# Patient Record
Sex: Male | Born: 1970
Health system: Southern US, Community
[De-identification: ages and names within clinical notes are randomized; demographics above are authoritative.]

## PROBLEM LIST (undated history)

## (undated) DIAGNOSIS — I5032 Chronic diastolic (congestive) heart failure: Secondary | ICD-10-CM

## (undated) DIAGNOSIS — I340 Nonrheumatic mitral (valve) insufficiency: Secondary | ICD-10-CM

## (undated) DIAGNOSIS — C6290 Malignant neoplasm of unspecified testis, unspecified whether descended or undescended: Secondary | ICD-10-CM

## (undated) DIAGNOSIS — I499 Cardiac arrhythmia, unspecified: Secondary | ICD-10-CM

## (undated) DIAGNOSIS — G4733 Obstructive sleep apnea (adult) (pediatric): Secondary | ICD-10-CM

## (undated) DIAGNOSIS — I4819 Other persistent atrial fibrillation: Secondary | ICD-10-CM

## (undated) DIAGNOSIS — G4730 Sleep apnea, unspecified: Secondary | ICD-10-CM

## (undated) DIAGNOSIS — T407X1A Poisoning by cannabis (derivatives), accidental (unintentional), initial encounter: Secondary | ICD-10-CM

## (undated) DIAGNOSIS — Z9889 Other specified postprocedural states: Secondary | ICD-10-CM

## (undated) DIAGNOSIS — I4891 Unspecified atrial fibrillation: Secondary | ICD-10-CM

## (undated) DIAGNOSIS — N179 Acute kidney failure, unspecified: Secondary | ICD-10-CM

## (undated) DIAGNOSIS — F1290 Cannabis use, unspecified, uncomplicated: Secondary | ICD-10-CM

## (undated) DIAGNOSIS — I071 Rheumatic tricuspid insufficiency: Secondary | ICD-10-CM

## (undated) DIAGNOSIS — M109 Gout, unspecified: Secondary | ICD-10-CM

## (undated) DIAGNOSIS — T40711A Poisoning by cannabis, accidental (unintentional), initial encounter: Secondary | ICD-10-CM

## (undated) DIAGNOSIS — I4892 Unspecified atrial flutter: Secondary | ICD-10-CM

## (undated) HISTORY — DX: Acute kidney failure, unspecified: N17.9

## (undated) HISTORY — DX: Cardiac arrhythmia, unspecified: I49.9

## (undated) HISTORY — DX: Poisoning by cannabis, accidental (unintentional), initial encounter: T40.711A

## (undated) HISTORY — PX: SURGERY SCROTAL / TESTICULAR: SUR1316

## (undated) HISTORY — DX: Obstructive sleep apnea (adult) (pediatric): G47.33

---
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
## 2012-01-19 DEATH — deceased

## 2016-06-23 ENCOUNTER — Encounter (HOSPITAL_COMMUNITY): Payer: Self-pay | Admitting: *Deleted

## 2016-06-23 ENCOUNTER — Emergency Department (HOSPITAL_COMMUNITY): Payer: Medicaid - Out of State

## 2016-06-23 ENCOUNTER — Inpatient Hospital Stay (HOSPITAL_COMMUNITY)
Admission: EM | Admit: 2016-06-23 | Discharge: 2016-07-06 | DRG: 493 | Disposition: A | Discharge status: Home / Self Care | Payer: Medicaid - Out of State | Attending: Surgery | Admitting: Surgery

## 2016-06-23 DIAGNOSIS — N200 Calculus of kidney: Secondary | ICD-10-CM | POA: Diagnosis not present

## 2016-06-23 DIAGNOSIS — S32009A Unspecified fracture of unspecified lumbar vertebra, initial encounter for closed fracture: Secondary | ICD-10-CM

## 2016-06-23 DIAGNOSIS — Z79899 Other long term (current) drug therapy: Secondary | ICD-10-CM

## 2016-06-23 DIAGNOSIS — M109 Gout, unspecified: Secondary | ICD-10-CM | POA: Diagnosis present

## 2016-06-23 DIAGNOSIS — S8252XA Displaced fracture of medial malleolus of left tibia, initial encounter for closed fracture: Secondary | ICD-10-CM | POA: Diagnosis present

## 2016-06-23 DIAGNOSIS — S82201A Unspecified fracture of shaft of right tibia, initial encounter for closed fracture: Secondary | ICD-10-CM

## 2016-06-23 DIAGNOSIS — S43102A Unspecified dislocation of left acromioclavicular joint, initial encounter: Secondary | ICD-10-CM | POA: Diagnosis not present

## 2016-06-23 DIAGNOSIS — S0101XA Laceration without foreign body of scalp, initial encounter: Secondary | ICD-10-CM | POA: Diagnosis present

## 2016-06-23 DIAGNOSIS — S82892A Other fracture of left lower leg, initial encounter for closed fracture: Secondary | ICD-10-CM

## 2016-06-23 DIAGNOSIS — S82141A Displaced bicondylar fracture of right tibia, initial encounter for closed fracture: Principal | ICD-10-CM | POA: Diagnosis present

## 2016-06-23 DIAGNOSIS — S43109A Unspecified dislocation of unspecified acromioclavicular joint, initial encounter: Secondary | ICD-10-CM

## 2016-06-23 DIAGNOSIS — S82209A Unspecified fracture of shaft of unspecified tibia, initial encounter for closed fracture: Secondary | ICD-10-CM

## 2016-06-23 DIAGNOSIS — S32029A Unspecified fracture of second lumbar vertebra, initial encounter for closed fracture: Secondary | ICD-10-CM | POA: Diagnosis present

## 2016-06-23 DIAGNOSIS — S0191XA Laceration without foreign body of unspecified part of head, initial encounter: Secondary | ICD-10-CM | POA: Diagnosis present

## 2016-06-23 DIAGNOSIS — S93422A Sprain of deltoid ligament of left ankle, initial encounter: Secondary | ICD-10-CM | POA: Diagnosis not present

## 2016-06-23 DIAGNOSIS — Z8547 Personal history of malignant neoplasm of testis: Secondary | ICD-10-CM

## 2016-06-23 DIAGNOSIS — Z419 Encounter for procedure for purposes other than remedying health state, unspecified: Secondary | ICD-10-CM

## 2016-06-23 DIAGNOSIS — Z91010 Allergy to peanuts: Secondary | ICD-10-CM

## 2016-06-23 DIAGNOSIS — M25572 Pain in left ankle and joints of left foot: Secondary | ICD-10-CM

## 2016-06-23 HISTORY — DX: Unspecified fracture of shaft of unspecified tibia, initial encounter for closed fracture: S82.209A

## 2016-06-23 HISTORY — DX: Gout, unspecified: M10.9

## 2016-06-23 HISTORY — DX: Malignant neoplasm of unspecified testis, unspecified whether descended or undescended: C62.90

## 2016-06-23 LAB — COMPREHENSIVE METABOLIC PANEL
ALT: 25 U/L (ref 17–63)
AST: 37 U/L (ref 15–41)
Albumin: 3.8 g/dL (ref 3.5–5.0)
Alkaline Phosphatase: 78 U/L (ref 38–126)
Anion gap: 8 (ref 5–15)
BUN: 14 mg/dL (ref 6–20)
CHLORIDE: 103 mmol/L (ref 101–111)
CO2: 27 mmol/L (ref 22–32)
CREATININE: 1.13 mg/dL (ref 0.61–1.24)
Calcium: 8.8 mg/dL — ABNORMAL LOW (ref 8.9–10.3)
GFR calc Af Amer: >60 mL/min (ref >60)
GFR calc non Af Amer: >60 mL/min (ref >60)
Glucose, Bld: 105 mg/dL — ABNORMAL HIGH (ref 65–99)
Potassium: 3.2 mmol/L — ABNORMAL LOW (ref 3.5–5.1)
SODIUM: 138 mmol/L (ref 135–145)
Total Bilirubin: 0.7 mg/dL (ref 0.3–1.2)
Total Protein: 6.8 g/dL (ref 6.5–8.1)

## 2016-06-23 LAB — CBC
HCT: 49.1 % (ref 39.0–52.0)
Hemoglobin: 16.3 g/dL (ref 13.0–17.0)
MCH: 28.3 pg (ref 26.0–34.0)
MCHC: 33.2 g/dL (ref 30.0–36.0)
MCV: 85.4 fL (ref 78.0–100.0)
PLATELETS: 277 10*3/uL (ref 150–400)
RBC: 5.75 MIL/uL (ref 4.22–5.81)
RDW: 13.2 % (ref 11.5–15.5)
WBC: 11.5 10*3/uL — ABNORMAL HIGH (ref 4.0–10.5)

## 2016-06-23 LAB — PROTIME-INR
INR: 1.01
PROTHROMBIN TIME: 13.3 s (ref 11.4–15.2)

## 2016-06-23 LAB — I-STAT CHEM 8, ED
BUN: 17 mg/dL (ref 6–20)
Calcium, Ion: 1.07 mmol/L — ABNORMAL LOW (ref 1.13–1.30)
Chloride: 99 mmol/L — ABNORMAL LOW (ref 101–111)
Creatinine, Ser: 1.1 mg/dL (ref 0.61–1.24)
Glucose, Bld: 105 mg/dL — ABNORMAL HIGH (ref 65–99)
HEMATOCRIT: 50 % (ref 39.0–52.0)
HEMOGLOBIN: 17 g/dL (ref 13.0–17.0)
POTASSIUM: 3.1 mmol/L — AB (ref 3.5–5.1)
Sodium: 141 mmol/L (ref 135–145)
TCO2: 27 mmol/L (ref 0–100)

## 2016-06-23 LAB — I-STAT CG4 LACTIC ACID, ED: Lactic Acid, Venous: 2.37 mmol/L (ref 0.5–1.9)

## 2016-06-23 LAB — SAMPLE TO BLOOD BANK

## 2016-06-23 LAB — CDS SEROLOGY

## 2016-06-23 LAB — ETHANOL: Alcohol, Ethyl (B): <5 mg/dL (ref <5)

## 2016-06-23 MED ORDER — DIPHENHYDRAMINE HCL 12.5 MG/5ML PO ELIX
12.5000 mg | ORAL_SOLUTION | Freq: Four times a day (QID) | ORAL | Status: DC | PRN
  Administered 2016-06-25 – 2016-06-27 (×2): 12.5 mg via ORAL
  Filled 2016-06-23 (×2): qty 10

## 2016-06-23 MED ORDER — NALOXONE HCL 0.4 MG/ML IJ SOLN: 0.4000 mg | INTRAMUSCULAR | Status: DC | PRN

## 2016-06-23 MED ORDER — SODIUM CHLORIDE 0.9 % IV SOLN
INTRAVENOUS | Status: DC
  Administered 2016-06-23: 21:00:00 via INTRAVENOUS

## 2016-06-23 MED ORDER — OXYCODONE HCL 5 MG PO TABS
10.0000 mg | ORAL_TABLET | ORAL | Status: DC | PRN
  Administered 2016-06-24: 10 mg via ORAL
  Filled 2016-06-23 (×2): qty 2

## 2016-06-23 MED ORDER — SODIUM CHLORIDE 0.9% FLUSH: 9.0000 mL | INTRAVENOUS | Status: DC | PRN

## 2016-06-23 MED ORDER — ONDANSETRON HCL 4 MG/2ML IJ SOLN
4.0000 mg | Freq: Four times a day (QID) | INTRAMUSCULAR | Status: DC | PRN
  Administered 2016-07-03: 4 mg via INTRAVENOUS

## 2016-06-23 MED ORDER — TETANUS-DIPHTH-ACELL PERTUSSIS 5-2.5-18.5 LF-MCG/0.5 IM SUSP
0.5000 mL | Freq: Once | INTRAMUSCULAR | Status: AC
  Administered 2016-06-23: 0.5 mL via INTRAMUSCULAR
  Filled 2016-06-23: qty 0.5

## 2016-06-23 MED ORDER — ONDANSETRON HCL 4 MG/2ML IJ SOLN: 4.0000 mg | Freq: Four times a day (QID) | INTRAMUSCULAR | Status: DC | PRN

## 2016-06-23 MED ORDER — FENTANYL CITRATE (PF) 100 MCG/2ML IJ SOLN
100.0000 ug | Freq: Once | INTRAMUSCULAR | Status: AC
  Administered 2016-06-23: 100 ug via INTRAVENOUS
  Filled 2016-06-23: qty 2

## 2016-06-23 MED ORDER — HYDROMORPHONE 1 MG/ML IV SOLN
INTRAVENOUS | Status: DC
  Administered 2016-06-24: 0.5 mg via INTRAVENOUS
  Administered 2016-06-24: 2.7 mg via INTRAVENOUS
  Administered 2016-06-24 (×2): 0.5 mg via INTRAVENOUS
  Administered 2016-06-24: 4.4 mg via INTRAVENOUS
  Filled 2016-06-23: qty 25

## 2016-06-23 MED ORDER — TETANUS-DIPHTHERIA TOXOIDS TD 5-2 LFU IM INJ: 0.5000 mL | INJECTION | Freq: Once | INTRAMUSCULAR | Status: DC

## 2016-06-23 MED ORDER — ONDANSETRON HCL 4 MG PO TABS: 4.0000 mg | ORAL_TABLET | Freq: Four times a day (QID) | ORAL | Status: DC | PRN

## 2016-06-23 MED ORDER — ENOXAPARIN SODIUM 40 MG/0.4ML ~~LOC~~ SOLN
40.0000 mg | Freq: Every day | SUBCUTANEOUS | Status: DC
  Administered 2016-06-24 – 2016-07-06 (×12): 40 mg via SUBCUTANEOUS
  Filled 2016-06-23 (×11): qty 0.4

## 2016-06-23 MED ORDER — DIPHENHYDRAMINE HCL 50 MG/ML IJ SOLN: 12.5000 mg | Freq: Four times a day (QID) | INTRAMUSCULAR | Status: DC | PRN

## 2016-06-23 MED ORDER — SODIUM CHLORIDE 0.9 % IV BOLUS (SEPSIS)
1000.0000 mL | Freq: Once | INTRAVENOUS | Status: AC
  Administered 2016-06-23: 1000 mL via INTRAVENOUS

## 2016-06-23 MED ORDER — IOPAMIDOL (ISOVUE-300) INJECTION 61%
INTRAVENOUS | Status: AC
  Administered 2016-06-23: 100 mL
  Filled 2016-06-23: qty 100

## 2016-06-23 MED ORDER — KCL IN DEXTROSE-NACL 20-5-0.9 MEQ/L-%-% IV SOLN
INTRAVENOUS | Status: DC
  Filled 2016-06-23: qty 1000
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
## 2016-06-23 NOTE — ED Notes (Signed)
Ortho PA at bedside.  

## 2016-06-23 NOTE — ED Notes (Signed)
Attempted report 
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
## 2016-06-24 ENCOUNTER — Inpatient Hospital Stay (HOSPITAL_COMMUNITY): Payer: Medicaid - Out of State

## 2016-06-24 ENCOUNTER — Encounter (HOSPITAL_COMMUNITY): Payer: Self-pay | Admitting: Anesthesiology

## 2016-06-24 ENCOUNTER — Inpatient Hospital Stay (HOSPITAL_COMMUNITY): Payer: Medicaid - Out of State | Admitting: Anesthesiology

## 2016-06-24 ENCOUNTER — Encounter (HOSPITAL_COMMUNITY): Admission: EM | Disposition: A | Discharge status: Home / Self Care | Payer: Self-pay | Source: Home / Self Care

## 2016-06-24 HISTORY — PX: EXTERNAL FIXATION LEG: SHX1549

## 2016-06-24 LAB — COMPREHENSIVE METABOLIC PANEL
ALT: 22 U/L (ref 17–63)
ANION GAP: 8 (ref 5–15)
AST: 32 U/L (ref 15–41)
Albumin: 3.3 g/dL — ABNORMAL LOW (ref 3.5–5.0)
Alkaline Phosphatase: 66 U/L (ref 38–126)
BUN: 9 mg/dL (ref 6–20)
CALCIUM: 8.1 mg/dL — AB (ref 8.9–10.3)
CHLORIDE: 102 mmol/L (ref 101–111)
CO2: 29 mmol/L (ref 22–32)
Creatinine, Ser: 0.84 mg/dL (ref 0.61–1.24)
GFR calc non Af Amer: >60 mL/min (ref >60)
Glucose, Bld: 116 mg/dL — ABNORMAL HIGH (ref 65–99)
Potassium: 3.7 mmol/L (ref 3.5–5.1)
SODIUM: 139 mmol/L (ref 135–145)
Total Bilirubin: 0.8 mg/dL (ref 0.3–1.2)
Total Protein: 6.1 g/dL — ABNORMAL LOW (ref 6.5–8.1)

## 2016-06-24 LAB — CBC
HCT: 44 % (ref 39.0–52.0)
Hemoglobin: 14.3 g/dL (ref 13.0–17.0)
MCH: 28.1 pg (ref 26.0–34.0)
MCHC: 32.5 g/dL (ref 30.0–36.0)
MCV: 86.6 fL (ref 78.0–100.0)
PLATELETS: 238 10*3/uL (ref 150–400)
RBC: 5.08 MIL/uL (ref 4.22–5.81)
RDW: 13.5 % (ref 11.5–15.5)
WBC: 10 10*3/uL (ref 4.0–10.5)

## 2016-06-24 LAB — SURGICAL PCR SCREEN
MRSA, PCR: NEGATIVE
Staphylococcus aureus: POSITIVE — AB

## 2016-06-24 SURGERY — EXTERNAL FIXATION, LOWER EXTREMITY
Anesthesia: General | Site: Leg Lower | Laterality: Right

## 2016-06-24 MED ORDER — ACETAMINOPHEN 500 MG PO TABS
1000.0000 mg | ORAL_TABLET | Freq: Once | ORAL | Status: AC
  Administered 2016-06-24: 1000 mg via ORAL
  Filled 2016-06-24: qty 2

## 2016-06-24 MED ORDER — DOCUSATE SODIUM 100 MG PO CAPS
100.0000 mg | ORAL_CAPSULE | Freq: Two times a day (BID) | ORAL | Status: DC
  Administered 2016-06-24 – 2016-07-06 (×24): 100 mg via ORAL
  Filled 2016-06-24 (×25): qty 1

## 2016-06-24 MED ORDER — ONDANSETRON HCL 4 MG PO TABS: 4.0000 mg | ORAL_TABLET | Freq: Three times a day (TID) | ORAL | 0 refills | Status: DC | PRN

## 2016-06-24 MED ORDER — OMEPRAZOLE 20 MG PO CPDR: 20.0000 mg | DELAYED_RELEASE_CAPSULE | Freq: Every day | ORAL | 0 refills | Status: DC

## 2016-06-24 MED ORDER — METOCLOPRAMIDE HCL 5 MG/ML IJ SOLN: 5.0000 mg | Freq: Three times a day (TID) | INTRAMUSCULAR | Status: DC | PRN

## 2016-06-24 MED ORDER — FENTANYL CITRATE (PF) 100 MCG/2ML IJ SOLN
INTRAMUSCULAR | Status: AC
  Filled 2016-06-24: qty 2

## 2016-06-24 MED ORDER — PHENYLEPHRINE HCL 10 MG/ML IJ SOLN
INTRAMUSCULAR | Status: DC | PRN
  Administered 2016-06-24: 80 ug via INTRAVENOUS

## 2016-06-24 MED ORDER — LABETALOL HCL 5 MG/ML IV SOLN
5.0000 mg | INTRAVENOUS | Status: AC | PRN
  Administered 2016-06-24 (×2): 5 mg via INTRAVENOUS

## 2016-06-24 MED ORDER — FLEET ENEMA 7-19 GM/118ML RE ENEM: 1.0000 | ENEMA | Freq: Once | RECTAL | Status: DC | PRN

## 2016-06-24 MED ORDER — CEFAZOLIN SODIUM-DEXTROSE 2-4 GM/100ML-% IV SOLN
2.0000 g | Freq: Four times a day (QID) | INTRAVENOUS | Status: AC
  Administered 2016-06-24 – 2016-06-25 (×3): 2 g via INTRAVENOUS
  Filled 2016-06-24 (×3): qty 100

## 2016-06-24 MED ORDER — KETOROLAC TROMETHAMINE 30 MG/ML IJ SOLN
30.0000 mg | Freq: Once | INTRAMUSCULAR | Status: AC
  Administered 2016-06-24: 30 mg via INTRAVENOUS

## 2016-06-24 MED ORDER — BISACODYL 10 MG RE SUPP: 10.0000 mg | Freq: Every day | RECTAL | Status: DC | PRN

## 2016-06-24 MED ORDER — LACTATED RINGERS IV SOLN
INTRAVENOUS | Status: DC
  Administered 2016-06-24: 12:00:00 via INTRAVENOUS

## 2016-06-24 MED ORDER — HYDROMORPHONE HCL 1 MG/ML IJ SOLN
0.5000 mg | INTRAMUSCULAR | Status: DC | PRN
  Administered 2016-06-24 – 2016-06-27 (×3): 1 mg via INTRAVENOUS
  Filled 2016-06-24 (×3): qty 1

## 2016-06-24 MED ORDER — MUPIROCIN 2 % EX OINT
1.0000 "application " | TOPICAL_OINTMENT | Freq: Two times a day (BID) | CUTANEOUS | Status: AC
  Administered 2016-06-24 – 2016-06-28 (×8): 1 via NASAL
  Filled 2016-06-24: qty 22

## 2016-06-24 MED ORDER — METOCLOPRAMIDE HCL 5 MG PO TABS: 5.0000 mg | ORAL_TABLET | Freq: Three times a day (TID) | ORAL | Status: DC | PRN

## 2016-06-24 MED ORDER — LACTATED RINGERS IV SOLN
INTRAVENOUS | Status: DC | PRN
  Administered 2016-06-24 (×2): via INTRAVENOUS

## 2016-06-24 MED ORDER — POVIDONE-IODINE 10 % EX SWAB: 2.0000 "application " | Freq: Once | CUTANEOUS | Status: DC

## 2016-06-24 MED ORDER — PROPOFOL 10 MG/ML IV BOLUS
INTRAVENOUS | Status: AC
  Filled 2016-06-24: qty 40

## 2016-06-24 MED ORDER — PROPOFOL 10 MG/ML IV BOLUS
INTRAVENOUS | Status: DC | PRN
  Administered 2016-06-24: 200 mg via INTRAVENOUS

## 2016-06-24 MED ORDER — DOCUSATE SODIUM 100 MG PO CAPS: 100.0000 mg | ORAL_CAPSULE | Freq: Two times a day (BID) | ORAL | 0 refills | Status: DC

## 2016-06-24 MED ORDER — SODIUM CHLORIDE 0.9 % IR SOLN
Status: DC | PRN
  Administered 2016-06-24: 1000 mL

## 2016-06-24 MED ORDER — ASPIRIN EC 325 MG PO TBEC: 325.0000 mg | DELAYED_RELEASE_TABLET | Freq: Every day | ORAL | 0 refills | Status: DC

## 2016-06-24 MED ORDER — OXYCODONE HCL 5 MG PO TABS
5.0000 mg | ORAL_TABLET | ORAL | Status: DC | PRN
  Administered 2016-06-24: 5 mg via ORAL
  Administered 2016-06-24 – 2016-06-26 (×8): 10 mg via ORAL
  Administered 2016-06-26: 5 mg via ORAL
  Administered 2016-06-26 – 2016-06-27 (×4): 10 mg via ORAL
  Filled 2016-06-24 (×13): qty 2

## 2016-06-24 MED ORDER — CELECOXIB 200 MG PO CAPS
200.0000 mg | ORAL_CAPSULE | Freq: Two times a day (BID) | ORAL | Status: DC
  Administered 2016-06-24 – 2016-07-06 (×24): 200 mg via ORAL
  Filled 2016-06-24 (×24): qty 1

## 2016-06-24 MED ORDER — CHLORHEXIDINE GLUCONATE CLOTH 2 % EX PADS
6.0000 | MEDICATED_PAD | Freq: Every day | CUTANEOUS | Status: DC
  Administered 2016-06-25 – 2016-06-26 (×2): 6 via TOPICAL

## 2016-06-24 MED ORDER — KETOROLAC TROMETHAMINE 15 MG/ML IJ SOLN
15.0000 mg | Freq: Four times a day (QID) | INTRAMUSCULAR | Status: AC
  Administered 2016-06-24 – 2016-06-25 (×3): 15 mg via INTRAVENOUS
  Filled 2016-06-24 (×3): qty 1

## 2016-06-24 MED ORDER — KETOROLAC TROMETHAMINE 30 MG/ML IJ SOLN: 30.0000 mg | Freq: Once | INTRAMUSCULAR | Status: AC | PRN

## 2016-06-24 MED ORDER — POLYETHYLENE GLYCOL 3350 17 G PO PACK: 17.0000 g | PACK | Freq: Every day | ORAL | Status: DC | PRN

## 2016-06-24 MED ORDER — OXYCODONE-ACETAMINOPHEN 5-325 MG PO TABS: 1.0000 | ORAL_TABLET | ORAL | 0 refills | Status: DC | PRN

## 2016-06-24 MED ORDER — LIDOCAINE HCL (CARDIAC) 20 MG/ML IV SOLN
INTRAVENOUS | Status: DC | PRN
  Administered 2016-06-24: 50 mg via INTRAVENOUS

## 2016-06-24 MED ORDER — METHOCARBAMOL 500 MG PO TABS: 500.0000 mg | ORAL_TABLET | Freq: Four times a day (QID) | ORAL | 0 refills | Status: DC | PRN

## 2016-06-24 MED ORDER — LACTATED RINGERS IV SOLN
INTRAVENOUS | Status: DC
  Administered 2016-06-24: 07:00:00 via INTRAVENOUS

## 2016-06-24 MED ORDER — DEXTROSE-NACL 5-0.45 % IV SOLN: 100.0000 mL/h | INTRAVENOUS | Status: DC

## 2016-06-24 MED ORDER — FENTANYL CITRATE (PF) 100 MCG/2ML IJ SOLN
25.0000 ug | INTRAMUSCULAR | Status: DC | PRN
  Administered 2016-06-24 (×2): 25 ug via INTRAVENOUS
  Administered 2016-06-24: 50 ug via INTRAVENOUS
  Administered 2016-06-24 (×2): 25 ug via INTRAVENOUS

## 2016-06-24 MED ORDER — LABETALOL HCL 5 MG/ML IV SOLN
INTRAVENOUS | Status: AC
  Filled 2016-06-24: qty 4

## 2016-06-24 MED ORDER — METHOCARBAMOL 500 MG PO TABS
500.0000 mg | ORAL_TABLET | Freq: Four times a day (QID) | ORAL | Status: DC | PRN
  Administered 2016-06-24 – 2016-07-06 (×28): 500 mg via ORAL
  Filled 2016-06-24 (×30): qty 1

## 2016-06-24 MED ORDER — FENTANYL CITRATE (PF) 250 MCG/5ML IJ SOLN
INTRAMUSCULAR | Status: AC
  Filled 2016-06-24: qty 5

## 2016-06-24 MED ORDER — ACETAMINOPHEN 650 MG RE SUPP: 650.0000 mg | Freq: Four times a day (QID) | RECTAL | Status: DC | PRN

## 2016-06-24 MED ORDER — METHOCARBAMOL 1000 MG/10ML IJ SOLN
500.0000 mg | Freq: Four times a day (QID) | INTRAVENOUS | Status: DC | PRN
  Filled 2016-06-24: qty 5

## 2016-06-24 MED ORDER — ACETAMINOPHEN 325 MG PO TABS
650.0000 mg | ORAL_TABLET | Freq: Four times a day (QID) | ORAL | Status: DC | PRN
  Administered 2016-06-26 – 2016-07-06 (×22): 650 mg via ORAL
  Filled 2016-06-24 (×24): qty 2

## 2016-06-24 MED ORDER — CEFAZOLIN SODIUM-DEXTROSE 2-4 GM/100ML-% IV SOLN
2.0000 g | INTRAVENOUS | Status: AC
  Administered 2016-06-24: 2 g via INTRAVENOUS
  Filled 2016-06-24: qty 100

## 2016-06-24 MED ORDER — CHLORHEXIDINE GLUCONATE 4 % EX LIQD
60.0000 mL | Freq: Once | CUTANEOUS | Status: AC
Start: 2016-06-24 — End: 2016-06-24
  Administered 2016-06-24: 4 via TOPICAL

## 2016-06-24 MED ORDER — PROMETHAZINE HCL 25 MG/ML IJ SOLN: 6.2500 mg | INTRAMUSCULAR | Status: DC | PRN

## 2016-06-24 MED ORDER — KETOROLAC TROMETHAMINE 30 MG/ML IJ SOLN
INTRAMUSCULAR | Status: AC
  Filled 2016-06-24: qty 1

## 2016-06-24 MED ORDER — HYDROCODONE-ACETAMINOPHEN 7.5-325 MG PO TABS
1.0000 | ORAL_TABLET | Freq: Once | ORAL | Status: AC | PRN
  Administered 2016-06-25: 1 via ORAL
  Filled 2016-06-24: qty 1

## 2016-06-24 MED ORDER — ACETAMINOPHEN 500 MG PO TABS
1000.0000 mg | ORAL_TABLET | Freq: Three times a day (TID) | ORAL | Status: AC
  Administered 2016-06-24 – 2016-06-25 (×3): 1000 mg via ORAL
  Filled 2016-06-24 (×3): qty 2

## 2016-06-24 MED ORDER — MIDAZOLAM HCL 2 MG/2ML IJ SOLN
INTRAMUSCULAR | Status: AC
  Filled 2016-06-24: qty 2

## 2016-06-24 SURGICAL SUPPLY — 63 items
BANDAGE ACE 6X5 VEL STRL LF (GAUZE/BANDAGES/DRESSINGS) ×3 IMPLANT
BANDAGE ELASTIC 4 VELCRO ST LF (GAUZE/BANDAGES/DRESSINGS) IMPLANT
BANDAGE ELASTIC 6 VELCRO ST LF (GAUZE/BANDAGES/DRESSINGS) IMPLANT
BANDAGE ESMARK 6X9 LF (GAUZE/BANDAGES/DRESSINGS) ×1 IMPLANT
BNDG COHESIVE 6X5 TAN STRL LF (GAUZE/BANDAGES/DRESSINGS) ×3 IMPLANT
BNDG ESMARK 6X9 LF (GAUZE/BANDAGES/DRESSINGS) ×3
BNDG GAUZE ELAST 4 BULKY (GAUZE/BANDAGES/DRESSINGS) ×3 IMPLANT
CLEANER TIP ELECTROSURG 2X2 (MISCELLANEOUS) ×3 IMPLANT
CLOSURE WOUND 1/2 X4 (GAUZE/BANDAGES/DRESSINGS)
COVER SURGICAL LIGHT HANDLE (MISCELLANEOUS) ×3 IMPLANT
CUFF TOURNIQUET SINGLE 34IN LL (TOURNIQUET CUFF) IMPLANT
DRAPE C-ARM 42X72 X-RAY (DRAPES) ×3 IMPLANT
DRAPE C-ARMOR (DRAPES) ×3 IMPLANT
DRAPE U-SHAPE 47X51 STRL (DRAPES) ×3 IMPLANT
DURAPREP 26ML APPLICATOR (WOUND CARE) ×6 IMPLANT
ELECT REM PT RETURN 9FT ADLT (ELECTROSURGICAL) ×3
ELECTRODE REM PT RTRN 9FT ADLT (ELECTROSURGICAL) ×1 IMPLANT
EVACUATOR 1/8 PVC DRAIN (DRAIN) IMPLANT
GAUZE SPONGE 4X4 12PLY STRL (GAUZE/BANDAGES/DRESSINGS) IMPLANT
GAUZE XEROFORM 1X8 LF (GAUZE/BANDAGES/DRESSINGS) ×3 IMPLANT
GLOVE BIOGEL PI IND STRL 7.5 (GLOVE) ×1 IMPLANT
GLOVE BIOGEL PI IND STRL 8 (GLOVE) ×1 IMPLANT
GLOVE BIOGEL PI INDICATOR 7.5 (GLOVE) ×2
GLOVE BIOGEL PI INDICATOR 8 (GLOVE) ×2
GLOVE ORTHO TXT STRL SZ7.5 (GLOVE) ×3 IMPLANT
GLOVE SURG ORTHO 8.0 STRL STRW (GLOVE) ×3 IMPLANT
GOWN STRL REUS W/ TWL LRG LVL3 (GOWN DISPOSABLE) ×3 IMPLANT
GOWN STRL REUS W/TWL LRG LVL3 (GOWN DISPOSABLE) ×6
HANDPIECE INTERPULSE COAX TIP (DISPOSABLE)
KIT BASIN OR (CUSTOM PROCEDURE TRAY) ×3 IMPLANT
KIT ROOM TURNOVER OR (KITS) ×3 IMPLANT
MANIFOLD NEPTUNE II (INSTRUMENTS) ×3 IMPLANT
NEEDLE 22X1 1/2 (OR ONLY) (NEEDLE) IMPLANT
NS IRRIG 1000ML POUR BTL (IV SOLUTION) ×3 IMPLANT
PACK ORTHO EXTREMITY (CUSTOM PROCEDURE TRAY) ×3 IMPLANT
PAD ARMBOARD 7.5X6 YLW CONV (MISCELLANEOUS) ×6 IMPLANT
PADDING CAST COTTON 6X4 STRL (CAST SUPPLIES) IMPLANT
PIN APEX 5X150 (EXFIX) ×6 IMPLANT
PIN APEX 6X180MM EXFIX (EXFIX) ×6 IMPLANT
PIN CLAMP 5H 30DEG POST (EXFIX) ×6 IMPLANT
ROD HOFFMANN3 CONNECT 11X500 (EXFIX) ×6 IMPLANT
ROD TO ROD COUPLING EXFIX (EXFIX) ×12 IMPLANT
SET HNDPC FAN SPRY TIP SCT (DISPOSABLE) IMPLANT
SPONGE LAP 18X18 X RAY DECT (DISPOSABLE) IMPLANT
SPONGE SCRUB IODOPHOR (GAUZE/BANDAGES/DRESSINGS) IMPLANT
STAPLER VISISTAT 35W (STAPLE) IMPLANT
STOCKINETTE IMPERVIOUS LG (DRAPES) IMPLANT
STRIP CLOSURE SKIN 1/2X4 (GAUZE/BANDAGES/DRESSINGS) IMPLANT
SUCTION FRAZIER HANDLE 10FR (MISCELLANEOUS)
SUCTION TUBE FRAZIER 10FR DISP (MISCELLANEOUS) IMPLANT
SUT ETHILON 3 0 PS 1 (SUTURE) IMPLANT
SUT VIC AB 0 CT1 27 (SUTURE)
SUT VIC AB 0 CT1 27XBRD ANBCTR (SUTURE) IMPLANT
SUT VIC AB 2-0 CT1 27 (SUTURE)
SUT VIC AB 2-0 CT1 TAPERPNT 27 (SUTURE) IMPLANT
SYR CONTROL 10ML LL (SYRINGE) IMPLANT
TOWEL OR 17X24 6PK STRL BLUE (TOWEL DISPOSABLE) ×6 IMPLANT
TOWEL OR 17X26 10 PK STRL BLUE (TOWEL DISPOSABLE) ×3 IMPLANT
TUBE CONNECTING 12'X1/4 (SUCTIONS) ×1
TUBE CONNECTING 12X1/4 (SUCTIONS) ×2 IMPLANT
UNDERPAD 30X30 INCONTINENT (UNDERPADS AND DIAPERS) ×3 IMPLANT
WATER STERILE IRR 1000ML POUR (IV SOLUTION) ×6 IMPLANT
YANKAUER SUCT BULB TIP NO VENT (SUCTIONS) ×3 IMPLANT
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
## 2016-06-24 NOTE — Transfer of Care (Signed)
Immediate Anesthesia Transfer of Care Note  Patient: Anthony Byrd  Procedure(s) Performed: Procedure(s): EXTERNAL FIXATION LEG (Right)  Patient Location: PACU  Anesthesia Type:General  Level of Consciousness: sedated and patient cooperative  Airway & Oxygen Therapy: Patient Spontanous Breathing and Patient connected to nasal cannula oxygen  Post-op Assessment: Report given to RN and Post -op Vital signs reviewed and stable  Post vital signs: Reviewed and stable  Last Vitals:  Vitals:   06/24/16 0618 06/24/16 0840  BP:  (!) 163/110  Pulse:  (!) 105  Resp: 16 18  Temp:  36.8 C    Last Pain:  Vitals:   06/24/16 0618  TempSrc:   PainSc: 7          Complications: No apparent anesthesia complications
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
## 2016-06-24 NOTE — Anesthesia Procedure Notes (Signed)
Procedure Name: LMA Insertion Date/Time: 06/24/2016 7:45 AM Performed by: Eligha Bridegroom Pre-anesthesia Checklist: Patient identified, Emergency Drugs available, Suction available, Patient being monitored and Timeout performed Patient Re-evaluated:Patient Re-evaluated prior to inductionOxygen Delivery Method: Circle system utilized Preoxygenation: Pre-oxygenation with 100% oxygen Intubation Type: IV induction LMA Size: 4.0 Placement Confirmation: positive ETCO2 and breath sounds checked- equal and bilateral Tube secured with: Tape Dental Injury: Teeth and Oropharynx as per pre-operative assessment

## 2016-06-24 NOTE — Anesthesia Preprocedure Evaluation (Addendum)
Anesthesia Evaluation  Patient identified by MRN, date of birth, ID band Patient awake    Reviewed: Allergy & Precautions, NPO status , Patient's Chart, lab work & pertinent test results  Airway Mallampati: III  TM Distance: >3 FB Neck ROM: Full    Dental  (+) Dental Advisory Given   Pulmonary neg pulmonary ROS, sleep apnea ,    breath sounds clear to auscultation       Cardiovascular negative cardio ROS   Rhythm:Regular Rate:Normal     Neuro/Psych negative neurological ROS     GI/Hepatic negative GI ROS, Neg liver ROS,   Endo/Other  Gout  Renal/GU negative Renal ROS     Musculoskeletal   Abdominal (+) + obese,   Peds  Hematology negative hematology ROS (+)   Anesthesia Other Findings   Reproductive/Obstetrics                           Lab Results  Component Value Date   WBC 10.0 06/24/2016   HGB 14.3 06/24/2016   HCT 44.0 06/24/2016   MCV 86.6 06/24/2016   PLT 238 06/24/2016   Lab Results  Component Value Date   CREATININE 0.84 06/24/2016   BUN 9 06/24/2016   NA 139 06/24/2016   K 3.7 06/24/2016   CL 102 06/24/2016   CO2 29 06/24/2016    Anesthesia Physical Anesthesia Plan  ASA: II  Anesthesia Plan: General   Post-op Pain Management:    Induction: Intravenous  Airway Management Planned: LMA and Oral ETT  Additional Equipment:   Intra-op Plan:   Post-operative Plan: Extubation in OR  Informed Consent: I have reviewed the patients History and Physical, chart, labs and discussed the procedure including the risks, benefits and alternatives for the proposed anesthesia with the patient or authorized representative who has indicated his/her understanding and acceptance.   Dental advisory given  Plan Discussed with: CRNA  Anesthesia Plan Comments:         Anesthesia Quick Evaluation

## 2016-06-24 NOTE — Progress Notes (Addendum)
Patient placed on SpO2 monitor in short stay. Saturations noted to be in the mid 80's range. Patient placed on 2 lpm oxygen. SpO2 increased. Patient having periods of apnea while sleeping, SpO2 intermittently dropping to mid to high 80's. Patient responding to verbal stimuli to take deep breaths with increase of SpO2. Dr. [name], T made aware.
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
## 2016-06-24 NOTE — Anesthesia Postprocedure Evaluation (Signed)
Anesthesia Post Note  Patient: Anthony Byrd  Procedure(s) Performed: Procedure(s) (LRB): EXTERNAL FIXATION LEG (Right)  Patient location during evaluation: PACU Anesthesia Type: General Level of consciousness: awake and alert Pain management: pain level controlled Vital Signs Assessment: post-procedure vital signs reviewed and stable Respiratory status: spontaneous breathing, nonlabored ventilation, respiratory function stable and patient connected to nasal cannula oxygen Cardiovascular status: blood pressure returned to baseline and stable Postop Assessment: no signs of nausea or vomiting Anesthetic complications: no    Last Vitals:  Vitals:   06/24/16 0945 06/24/16 0950  BP: (!) 157/94   Pulse: 81   Resp: 12   Temp:  36.9 C    Last Pain:  Vitals:   06/24/16 1118  TempSrc:   PainSc: 4                  Tiajuana Amass
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
## 2016-06-24 NOTE — OR Nursing (Signed)
Pt with difficult pain management ...respiratory drive very sensitive to narcotic transiently; pt awakens intermittently with excruciating pain. Beginning to appear more evenly managed with toradol dose on board with small intermittent doses of fentanyl.

## 2016-06-25 DIAGNOSIS — S0191XA Laceration without foreign body of unspecified part of head, initial encounter: Secondary | ICD-10-CM

## 2016-06-25 HISTORY — DX: Laceration without foreign body of unspecified part of head, initial encounter: S01.91XA

## 2016-06-25 MED ORDER — CHLORHEXIDINE GLUCONATE CLOTH 2 % EX PADS
6.0000 | MEDICATED_PAD | Freq: Every day | CUTANEOUS | Status: DC
  Administered 2016-06-25 – 2016-06-28 (×2): 6 via TOPICAL

## 2016-06-25 MED ORDER — MUPIROCIN 2 % EX OINT
1.0000 "application " | TOPICAL_OINTMENT | Freq: Two times a day (BID) | CUTANEOUS | Status: AC
  Administered 2016-06-25 – 2016-06-30 (×7): 1 via NASAL
  Filled 2016-06-25: qty 22
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
## 2016-06-26 ENCOUNTER — Encounter (HOSPITAL_COMMUNITY): Payer: Self-pay | Admitting: Orthopedic Surgery

## 2016-06-26 MED ORDER — POVIDONE-IODINE 10 % EX SWAB
2.0000 "application " | Freq: Once | CUTANEOUS | Status: AC
  Administered 2016-06-27: 2 via TOPICAL

## 2016-06-26 MED ORDER — POVIDONE-IODINE 7.5 % EX SOLN
Freq: Once | CUTANEOUS | Status: AC
  Administered 2016-06-27: 06:00:00 via TOPICAL

## 2016-06-26 MED ORDER — DEXTROSE-NACL 5-0.45 % IV SOLN
100.0000 mL/h | INTRAVENOUS | Status: DC
  Administered 2016-06-26: 100 mL/h via INTRAVENOUS

## 2016-06-26 MED ORDER — ACETAMINOPHEN 500 MG PO TABS
1000.0000 mg | ORAL_TABLET | Freq: Once | ORAL | Status: AC
  Administered 2016-06-27: 1000 mg via ORAL
  Filled 2016-06-26: qty 2

## 2016-06-26 MED ORDER — DOCUSATE SODIUM 100 MG PO CAPS: 100.0000 mg | ORAL_CAPSULE | Freq: Two times a day (BID) | ORAL | Status: DC

## 2016-06-26 MED ORDER — CEFAZOLIN SODIUM-DEXTROSE 2-4 GM/100ML-% IV SOLN
2.0000 g | INTRAVENOUS | Status: AC
  Administered 2016-06-27: 2 g via INTRAVENOUS

## 2016-06-26 MED ORDER — POLYETHYLENE GLYCOL 3350 17 G PO PACK
17.0000 g | PACK | Freq: Every day | ORAL | Status: DC
  Administered 2016-06-28 – 2016-07-06 (×8): 17 g via ORAL
  Filled 2016-06-26 (×9): qty 1
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
## 2016-06-27 ENCOUNTER — Inpatient Hospital Stay (HOSPITAL_COMMUNITY): Payer: Medicaid - Out of State

## 2016-06-27 ENCOUNTER — Inpatient Hospital Stay (HOSPITAL_COMMUNITY): Payer: Medicaid - Out of State | Admitting: Certified Registered"

## 2016-06-27 ENCOUNTER — Encounter (HOSPITAL_COMMUNITY): Payer: Self-pay | Admitting: Surgery

## 2016-06-27 ENCOUNTER — Encounter (HOSPITAL_COMMUNITY): Admission: EM | Disposition: A | Discharge status: Home / Self Care | Payer: Self-pay | Source: Home / Self Care

## 2016-06-27 HISTORY — PX: EXTERNAL FIXATION REMOVAL: SHX5040

## 2016-06-27 HISTORY — PX: ORIF TIBIA PLATEAU: SHX2132

## 2016-06-27 LAB — SURGICAL PCR SCREEN
MRSA, PCR: NEGATIVE
STAPHYLOCOCCUS AUREUS: POSITIVE — AB

## 2016-06-27 SURGERY — REMOVAL, EXTERNAL FIXATION DEVICE, LOWER EXTREMITY
Anesthesia: General | Site: Leg Lower | Laterality: Right

## 2016-06-27 MED ORDER — LACTATED RINGERS IV SOLN
INTRAVENOUS | Status: DC | PRN
  Administered 2016-06-27 (×2): via INTRAVENOUS

## 2016-06-27 MED ORDER — 0.9 % SODIUM CHLORIDE (POUR BTL) OPTIME
TOPICAL | Status: DC | PRN
  Administered 2016-06-27: 1000 mL

## 2016-06-27 MED ORDER — ROCURONIUM BROMIDE 100 MG/10ML IV SOLN
INTRAVENOUS | Status: DC | PRN
  Administered 2016-06-27: 30 mg via INTRAVENOUS

## 2016-06-27 MED ORDER — SUFENTANIL CITRATE 50 MCG/ML IV SOLN
INTRAVENOUS | Status: AC
  Filled 2016-06-27: qty 1

## 2016-06-27 MED ORDER — LACTATED RINGERS IV SOLN: INTRAVENOUS | Status: DC

## 2016-06-27 MED ORDER — LIDOCAINE 2% (20 MG/ML) 5 ML SYRINGE
INTRAMUSCULAR | Status: AC
  Filled 2016-06-27: qty 5

## 2016-06-27 MED ORDER — HYDROMORPHONE HCL 1 MG/ML IJ SOLN
INTRAMUSCULAR | Status: AC
  Administered 2016-06-27: 0.5 mg via INTRAVENOUS
  Filled 2016-06-27: qty 1

## 2016-06-27 MED ORDER — HYDROMORPHONE HCL 1 MG/ML IJ SOLN
1.0000 mg | INTRAMUSCULAR | Status: DC | PRN
  Administered 2016-06-27 – 2016-07-03 (×9): 1 mg via INTRAVENOUS
  Filled 2016-06-27: qty 1
  Filled 2016-06-27: qty 2
  Filled 2016-06-27 (×9): qty 1

## 2016-06-27 MED ORDER — MIDAZOLAM HCL 2 MG/2ML IJ SOLN
INTRAMUSCULAR | Status: AC
  Filled 2016-06-27: qty 2

## 2016-06-27 MED ORDER — NEOSTIGMINE METHYLSULFATE 5 MG/5ML IV SOSY
PREFILLED_SYRINGE | INTRAVENOUS | Status: AC
  Filled 2016-06-27: qty 5

## 2016-06-27 MED ORDER — KETOROLAC TROMETHAMINE 15 MG/ML IJ SOLN
INTRAMUSCULAR | Status: AC
  Filled 2016-06-27: qty 1

## 2016-06-27 MED ORDER — KETOROLAC TROMETHAMINE 15 MG/ML IJ SOLN
INTRAMUSCULAR | Status: AC
Start: 2016-06-27 — End: 2016-06-27
  Administered 2016-06-27: 15 mg via INTRAVENOUS
  Filled 2016-06-27: qty 1

## 2016-06-27 MED ORDER — MIDAZOLAM HCL 5 MG/5ML IJ SOLN
INTRAMUSCULAR | Status: DC | PRN
  Administered 2016-06-27: 2 mg via INTRAVENOUS

## 2016-06-27 MED ORDER — PHENYLEPHRINE HCL 10 MG/ML IJ SOLN
INTRAMUSCULAR | Status: DC | PRN
  Administered 2016-06-27 (×3): 80 ug via INTRAVENOUS

## 2016-06-27 MED ORDER — OXYCODONE HCL 5 MG PO TABS
5.0000 mg | ORAL_TABLET | ORAL | Status: DC | PRN
  Administered 2016-06-27 (×2): 15 mg via ORAL
  Administered 2016-06-28 (×2): 10 mg via ORAL
  Administered 2016-06-28: 15 mg via ORAL
  Administered 2016-06-28 – 2016-06-29 (×7): 10 mg via ORAL
  Administered 2016-06-29: 15 mg via ORAL
  Administered 2016-06-29 (×3): 10 mg via ORAL
  Administered 2016-06-30 (×4): 15 mg via ORAL
  Administered 2016-06-30: 10 mg via ORAL
  Administered 2016-06-30 – 2016-07-02 (×12): 15 mg via ORAL
  Administered 2016-07-03: 10 mg via ORAL
  Administered 2016-07-03: 5 mg via ORAL
  Administered 2016-07-03 – 2016-07-05 (×13): 15 mg via ORAL
  Administered 2016-07-05: 10 mg via ORAL
  Administered 2016-07-06 (×3): 15 mg via ORAL
  Filled 2016-06-27 (×2): qty 2
  Filled 2016-06-27: qty 3
  Filled 2016-06-27: qty 2
  Filled 2016-06-27: qty 1
  Filled 2016-06-27 (×2): qty 3
  Filled 2016-06-27 (×2): qty 2
  Filled 2016-06-27: qty 3
  Filled 2016-06-27: qty 2
  Filled 2016-06-27: qty 3
  Filled 2016-06-27: qty 2
  Filled 2016-06-27 (×4): qty 3
  Filled 2016-06-27: qty 2
  Filled 2016-06-27 (×16): qty 3
  Filled 2016-06-27: qty 2
  Filled 2016-06-27: qty 3
  Filled 2016-06-27: qty 2
  Filled 2016-06-27 (×3): qty 3
  Filled 2016-06-27: qty 2
  Filled 2016-06-27 (×7): qty 3
  Filled 2016-06-27: qty 2
  Filled 2016-06-27: qty 3
  Filled 2016-06-27: qty 2
  Filled 2016-06-27 (×3): qty 3
  Filled 2016-06-27: qty 2
  Filled 2016-06-27 (×2): qty 3

## 2016-06-27 MED ORDER — PROPOFOL 10 MG/ML IV BOLUS
INTRAVENOUS | Status: DC | PRN
  Administered 2016-06-27: 250 mg via INTRAVENOUS

## 2016-06-27 MED ORDER — OXYCODONE HCL 5 MG PO TABS
ORAL_TABLET | ORAL | Status: AC
  Administered 2016-06-27: 10 mg via ORAL
  Filled 2016-06-27: qty 2

## 2016-06-27 MED ORDER — KETOROLAC TROMETHAMINE 30 MG/ML IJ SOLN
INTRAMUSCULAR | Status: AC
  Filled 2016-06-27: qty 1

## 2016-06-27 MED ORDER — CEFAZOLIN SODIUM 1 G IJ SOLR
INTRAMUSCULAR | Status: AC
  Filled 2016-06-27: qty 20

## 2016-06-27 MED ORDER — BUPIVACAINE HCL (PF) 0.25 % IJ SOLN
INTRAMUSCULAR | Status: DC | PRN
  Administered 2016-06-27 (×3): 10 mL

## 2016-06-27 MED ORDER — PROPOFOL 10 MG/ML IV BOLUS
INTRAVENOUS | Status: AC
  Filled 2016-06-27: qty 20

## 2016-06-27 MED ORDER — BACITRACIN-NEOMYCIN-POLYMYXIN 400-5-5000 EX OINT
TOPICAL_OINTMENT | CUTANEOUS | Status: AC
  Filled 2016-06-27: qty 1

## 2016-06-27 MED ORDER — BUPIVACAINE HCL (PF) 0.25 % IJ SOLN
INTRAMUSCULAR | Status: AC
  Filled 2016-06-27: qty 30

## 2016-06-27 MED ORDER — SUFENTANIL CITRATE 50 MCG/ML IV SOLN
INTRAVENOUS | Status: DC | PRN
  Administered 2016-06-27: 15 ug via INTRAVENOUS
  Administered 2016-06-27 (×2): 5 ug via INTRAVENOUS
  Administered 2016-06-27: 10 ug via INTRAVENOUS

## 2016-06-27 MED ORDER — HYDROMORPHONE HCL 1 MG/ML IJ SOLN
INTRAMUSCULAR | Status: AC
  Filled 2016-06-27: qty 1

## 2016-06-27 MED ORDER — CEFAZOLIN SODIUM-DEXTROSE 2-4 GM/100ML-% IV SOLN
2.0000 g | Freq: Four times a day (QID) | INTRAVENOUS | Status: AC
  Administered 2016-06-27 – 2016-06-28 (×3): 2 g via INTRAVENOUS
  Filled 2016-06-27 (×3): qty 100

## 2016-06-27 MED ORDER — SODIUM CHLORIDE 0.9 % IJ SOLN
INTRAMUSCULAR | Status: AC
  Filled 2016-06-27: qty 10

## 2016-06-27 MED ORDER — SUCCINYLCHOLINE CHLORIDE 20 MG/ML IJ SOLN
INTRAMUSCULAR | Status: DC | PRN
  Administered 2016-06-27: 140 mg via INTRAVENOUS

## 2016-06-27 MED ORDER — LACTATED RINGERS IV SOLN
INTRAVENOUS | Status: DC
  Administered 2016-06-27: 13:00:00 via INTRAVENOUS

## 2016-06-27 MED ORDER — PROMETHAZINE HCL 25 MG/ML IJ SOLN
INTRAMUSCULAR | Status: AC
  Filled 2016-06-27: qty 1

## 2016-06-27 MED ORDER — LACTATED RINGERS IV SOLN
INTRAVENOUS | Status: DC
  Administered 2016-06-27: 08:00:00 via INTRAVENOUS

## 2016-06-27 MED ORDER — ROCURONIUM BROMIDE 10 MG/ML (PF) SYRINGE
PREFILLED_SYRINGE | INTRAVENOUS | Status: AC
  Filled 2016-06-27: qty 10

## 2016-06-27 MED ORDER — HYDROMORPHONE HCL 1 MG/ML IJ SOLN
0.2500 mg | INTRAMUSCULAR | Status: DC | PRN
  Administered 2016-06-27 (×4): 0.5 mg via INTRAVENOUS

## 2016-06-27 MED ORDER — LIDOCAINE HCL (CARDIAC) 20 MG/ML IV SOLN
INTRAVENOUS | Status: DC | PRN
  Administered 2016-06-27: 100 mg via INTRAVENOUS

## 2016-06-27 MED ORDER — NEOSTIGMINE METHYLSULFATE 10 MG/10ML IV SOLN
INTRAVENOUS | Status: DC | PRN
  Administered 2016-06-27: 1 mg via INTRAVENOUS

## 2016-06-27 MED ORDER — PHENYLEPHRINE 40 MCG/ML (10ML) SYRINGE FOR IV PUSH (FOR BLOOD PRESSURE SUPPORT)
PREFILLED_SYRINGE | INTRAVENOUS | Status: AC
  Filled 2016-06-27: qty 10

## 2016-06-27 MED ORDER — ONDANSETRON HCL 4 MG/2ML IJ SOLN
INTRAMUSCULAR | Status: DC | PRN
  Administered 2016-06-27: 4 mg via INTRAVENOUS

## 2016-06-27 MED ORDER — KETOROLAC TROMETHAMINE 15 MG/ML IJ SOLN
15.0000 mg | Freq: Four times a day (QID) | INTRAMUSCULAR | Status: DC
  Administered 2016-06-27: 15 mg via INTRAVENOUS

## 2016-06-27 SURGICAL SUPPLY — 89 items
BANDAGE ELASTIC 4 VELCRO ST LF (GAUZE/BANDAGES/DRESSINGS) IMPLANT
BANDAGE ELASTIC 6 VELCRO ST LF (GAUZE/BANDAGES/DRESSINGS) IMPLANT
BANDAGE ESMARK 6X9 LF (GAUZE/BANDAGES/DRESSINGS) ×1 IMPLANT
BIT DRILL 3.1X204 (BIT) ×3 IMPLANT
BIT DRILL AO MATTA 2.5MX230M (BIT) ×1 IMPLANT
BLADE SURG 10 STRL SS (BLADE) ×3 IMPLANT
BLADE SURG 15 STRL LF DISP TIS (BLADE) ×1 IMPLANT
BLADE SURG 15 STRL SS (BLADE) ×2
BNDG COHESIVE 4X5 TAN STRL (GAUZE/BANDAGES/DRESSINGS) ×3 IMPLANT
BNDG ELASTIC 6X10 VLCR STRL LF (GAUZE/BANDAGES/DRESSINGS) ×3 IMPLANT
BNDG ESMARK 6X9 LF (GAUZE/BANDAGES/DRESSINGS) ×3
BNDG GAUZE ELAST 4 BULKY (GAUZE/BANDAGES/DRESSINGS) ×3 IMPLANT
COVER MAYO STAND STRL (DRAPES) IMPLANT
COVER SURGICAL LIGHT HANDLE (MISCELLANEOUS) ×3 IMPLANT
CUFF TOURNIQUET SINGLE 34IN LL (TOURNIQUET CUFF) IMPLANT
DRAPE C-ARM 42X72 X-RAY (DRAPES) ×3 IMPLANT
DRAPE C-ARMOR (DRAPES) ×3 IMPLANT
DRAPE IMP U-DRAPE 54X76 (DRAPES) ×6 IMPLANT
DRAPE INCISE IOBAN 66X45 STRL (DRAPES) ×3 IMPLANT
DRAPE U-SHAPE 47X51 STRL (DRAPES) ×3 IMPLANT
DRILL BIT AO MATTA 2.5MX230M (BIT) ×3
DRSG ADAPTIC 3X8 NADH LF (GAUZE/BANDAGES/DRESSINGS) IMPLANT
DRSG MEPILEX BORDER 4X4 (GAUZE/BANDAGES/DRESSINGS) ×3 IMPLANT
DRSG MEPITEL 4X7.2 (GAUZE/BANDAGES/DRESSINGS) IMPLANT
DRSG PAD ABDOMINAL 8X10 ST (GAUZE/BANDAGES/DRESSINGS) ×3 IMPLANT
ELECT REM PT RETURN 9FT ADLT (ELECTROSURGICAL) ×3
ELECTRODE REM PT RTRN 9FT ADLT (ELECTROSURGICAL) ×1 IMPLANT
GAUZE SPONGE 4X4 12PLY STRL (GAUZE/BANDAGES/DRESSINGS) IMPLANT
GAUZE XEROFORM 1X8 LF (GAUZE/BANDAGES/DRESSINGS) ×3 IMPLANT
GLOVE BIO SURGEON STRL SZ7 (GLOVE) IMPLANT
GLOVE BIO SURGEON STRL SZ7.5 (GLOVE) ×6 IMPLANT
GLOVE BIOGEL PI IND STRL 6.5 (GLOVE) ×1 IMPLANT
GLOVE BIOGEL PI IND STRL 7.0 (GLOVE) IMPLANT
GLOVE BIOGEL PI IND STRL 7.5 (GLOVE) ×1 IMPLANT
GLOVE BIOGEL PI IND STRL 8 (GLOVE) ×2 IMPLANT
GLOVE BIOGEL PI INDICATOR 6.5 (GLOVE) ×2
GLOVE BIOGEL PI INDICATOR 7.0 (GLOVE)
GLOVE BIOGEL PI INDICATOR 7.5 (GLOVE) ×2
GLOVE BIOGEL PI INDICATOR 8 (GLOVE) ×4
GOWN STRL REUS W/ TWL LRG LVL3 (GOWN DISPOSABLE) ×3 IMPLANT
GOWN STRL REUS W/TWL LRG LVL3 (GOWN DISPOSABLE) ×6
IMMOBILIZER KNEE 22 (SOFTGOODS) ×3 IMPLANT
IMMOBILIZER KNEE 22 UNIV (SOFTGOODS) IMPLANT
K-WIRE SMOOTH 2.0X150 (WIRE) ×3
KIT BASIN OR (CUSTOM PROCEDURE TRAY) ×3 IMPLANT
KIT ROOM TURNOVER OR (KITS) ×3 IMPLANT
KWIRE SMOOTH 2.0X150 (WIRE) ×1 IMPLANT
MANIFOLD NEPTUNE II (INSTRUMENTS) ×3 IMPLANT
NDL SUT 6 .5 CRC .975X.05 MAYO (NEEDLE) IMPLANT
NEEDLE 22X1 1/2 (OR ONLY) (NEEDLE) ×3 IMPLANT
NEEDLE MAYO TAPER (NEEDLE)
NS IRRIG 1000ML POUR BTL (IV SOLUTION) ×3 IMPLANT
PACK ORTHO EXTREMITY (CUSTOM PROCEDURE TRAY) ×3 IMPLANT
PAD ABD 8X10 STRL (GAUZE/BANDAGES/DRESSINGS) ×3 IMPLANT
PAD ARMBOARD 7.5X6 YLW CONV (MISCELLANEOUS) ×6 IMPLANT
PAD CAST 4YDX4 CTTN HI CHSV (CAST SUPPLIES) IMPLANT
PADDING CAST COTTON 4X4 STRL (CAST SUPPLIES)
PLATE 4HOLE RIGHT (Plate) ×3 IMPLANT
SCREW  LOCKING 4.0 (Screw) ×3 IMPLANT
SCREW CORT 48X3.5XST NS LF (Screw) ×1 IMPLANT
SCREW CORTEX ST MATTA 3.5X34MM (Screw) ×6 IMPLANT
SCREW CORTEX ST MATTA 3.5X38M (Screw) ×6 IMPLANT
SCREW CORTEX ST MATTA 3.5X75MM (Screw) ×3 IMPLANT
SCREW CORTICAL 3.5X48MM (Screw) ×2 IMPLANT
SCREW LOCKING 4.0X65MM (Screw) ×6 IMPLANT
SCREW LOCKING 75MM (Screw) ×3 IMPLANT
SPONGE GAUZE 4X4 12PLY STER LF (GAUZE/BANDAGES/DRESSINGS) ×3 IMPLANT
SPONGE LAP 18X18 X RAY DECT (DISPOSABLE) ×3 IMPLANT
STOCKINETTE IMPERVIOUS LG (DRAPES) IMPLANT
SUCTION FRAZIER HANDLE 10FR (MISCELLANEOUS) ×2
SUCTION TUBE FRAZIER 10FR DISP (MISCELLANEOUS) ×1 IMPLANT
SUT ETHILON 3 0 PS 1 (SUTURE) ×6 IMPLANT
SUT FIBERWIRE #2 38 T-5 BLUE (SUTURE)
SUT FIBERWIRE 2-0 18 17.9 3/8 (SUTURE) ×6
SUT MNCRL AB 4-0 PS2 18 (SUTURE) IMPLANT
SUT MON AB 2-0 CT1 36 (SUTURE) ×3 IMPLANT
SUT VIC AB 0 CT1 27 (SUTURE) ×4
SUT VIC AB 0 CT1 27XBRD ANBCTR (SUTURE) ×2 IMPLANT
SUTURE FIBERWR #2 38 T-5 BLUE (SUTURE) IMPLANT
SUTURE FIBERWR 2-0 18 17.9 3/8 (SUTURE) ×2 IMPLANT
SYR CONTROL 10ML LL (SYRINGE) ×3 IMPLANT
TOWEL OR 17X24 6PK STRL BLUE (TOWEL DISPOSABLE) ×3 IMPLANT
TOWEL OR 17X26 10 PK STRL BLUE (TOWEL DISPOSABLE) ×6 IMPLANT
TOWEL OR NON WOVEN STRL DISP B (DISPOSABLE) IMPLANT
TRAY FOLEY CATH 14FR (SET/KITS/TRAYS/PACK) IMPLANT
TUBE CONNECTING 12'X1/4 (SUCTIONS) ×1
TUBE CONNECTING 12X1/4 (SUCTIONS) ×2 IMPLANT
WATER STERILE IRR 1000ML POUR (IV SOLUTION) ×3 IMPLANT
YANKAUER SUCT BULB TIP NO VENT (SUCTIONS) ×3 IMPLANT

## 2016-06-27 NOTE — Progress Notes (Signed)
Called ortho tech about getting Music therapist for patient.

## 2016-06-27 NOTE — Op Note (Signed)
06/23/2016 - 06/27/2016  4:40 PM  PATIENT:  [name]    PRE-OPERATIVE DIAGNOSIS:  RIGHT TIBIAL PLATEAU FRACTURE  POST-OPERATIVE DIAGNOSIS:  Same  PROCEDURE:  REMOVAL EXTERNAL FIXATION LEG, OPEN REDUCTION INTERNAL FIXATION (ORIF) TIBIAL PLATEAU  SURGEON:  Mahmood Boehringer, Ernesta Amble, MD  ASSISTANT: Roxan Hockey, PA-C, he was present and scrubbed throughout the case, critical for completion in a timely fashion, and for retraction, instrumentation, and closure.   ANESTHESIA:   gen  PREOPERATIVE INDICATIONS:  Anthony Byrd is a  45 y.o. male with a diagnosis of RIGHT TIBIAL PLATEAU FRACTURE who failed conservative measures and elected for surgical management.    The risks benefits and alternatives were discussed with the patient preoperatively including but not limited to the risks of infection, bleeding, nerve injury, cardiopulmonary complications, the need for revision surgery, among others, and the patient was willing to proceed.  OPERATIVE IMPLANTS: [employer]ker ant lat stainless plate  OPERATIVE FINDINGS: stable fixation  BLOOD LOSS: [id number]  COMPLICATIONS: none  TOURNIQUET TIME: none  OPERATIVE PROCEDURE:  Patient was identified in the preoperative holding area and site was marked by me He was transported to the operating theater and placed on the table in supine position taking care to pad all bony prominences. After a preincinduction time out anesthesia was induced. The right lower extremity was prepped and draped in normal sterile fashion and a pre-incision timeout was performed. He received ancef for preoperative antibiotics.   I prepped his external fixator into the field as well. I removed his lateral bar and placed Betadine spray over the newly exposed portion of the fixator prior to touching it.  I then made an anterior lateral approach to his knee I split his IT band in line with this I entered the anterior compartment and elevated off the anterior crest of the tibia. I performed a  fasciotomy here distal and left this open to allow for swelling.  Next I made a sub-meniscal arthrotomy into the joint. Identified the articular surface was congruent. I placed a repair stitch in the lateral meniscus where was torn from the capsule.  Thoroughly irrigated the joint.  Next I selected an anterior lateral locking plate and pinned it into place as very happy with the fixation I did bend it slightly to match his mild tibia vera that was symmetric to his contralateral side on bilateral lower extremity x-rays.  I placed 3 locking screws proximally I then placed 4 shaft screws pulling the shaft out of any fracture varus.  I placed the kickstand screw.  I took multiple x-rays as very happy with his fracture reduction and placement of all hardware. I then thoroughly irrigated his incision I repaired his capsule back down to the plate I closed his IT band and elected anterior compartment open as I performed a fasciotomy here.  I then removed his external fixator I closed his incisions in layers I then placed a sterile dressing and he was awoken and taken the PACU in stable condition.  POST OPERATIVE PLAN: Nonweightbearing Bledsoe locked in extension chemical DVT prophylaxis. Dispo when clears PT

## 2016-06-27 NOTE — Anesthesia Preprocedure Evaluation (Signed)
Anesthesia Evaluation  Patient identified by MRN, date of birth, ID band Patient awake    Reviewed: Allergy & Precautions, H&P , NPO status , Patient's Chart, lab work & pertinent test results  Airway Mallampati: II  TM Distance: >3 FB Neck ROM: full    Dental no notable dental hx. (+) Dental Advisory Given, Teeth Intact   Pulmonary neg pulmonary ROS,    Pulmonary exam normal breath sounds clear to auscultation       Cardiovascular Exercise Tolerance: Good negative cardio ROS Normal cardiovascular exam Rhythm:regular Rate:Normal     Neuro/Psych negative neurological ROS  negative psych ROS   GI/Hepatic negative GI ROS, Neg liver ROS,   Endo/Other  negative endocrine ROS  Renal/GU negative Renal ROS  negative genitourinary   Musculoskeletal   Abdominal   Peds  Hematology negative hematology ROS (+)   Anesthesia Other Findings   Reproductive/Obstetrics negative OB ROS                             Anesthesia Physical Anesthesia Plan  ASA: I  Anesthesia Plan: General   Post-op Pain Management:    Induction: Intravenous  Airway Management Planned: LMA  Additional Equipment:   Intra-op Plan:   Post-operative Plan:   Informed Consent: I have reviewed the patients History and Physical, chart, labs and discussed the procedure including the risks, benefits and alternatives for the proposed anesthesia with the patient or authorized representative who has indicated his/her understanding and acceptance.   Dental Advisory Given  Plan Discussed with: CRNA  Anesthesia Plan Comments:         Anesthesia Quick Evaluation  

## 2016-06-27 NOTE — Anesthesia Procedure Notes (Signed)
Procedure Name: Intubation Date/Time: 06/27/2016 9:11 AM Performed by: Izora Gala Pre-anesthesia Checklist: Patient identified, Emergency Drugs available, Suction available and Patient being monitored Patient Re-evaluated:Patient Re-evaluated prior to inductionOxygen Delivery Method: Circle system utilized Preoxygenation: Pre-oxygenation with 100% oxygen Intubation Type: IV induction Ventilation: Mask ventilation without difficulty Laryngoscope Size: Miller and 3 Grade View: Grade I Tube type: Oral Tube size: 7.5 mm Number of attempts: 1 Airway Equipment and Method: Stylet,  LTA kit utilized and Bite block Placement Confirmation: ETT inserted through vocal cords under direct vision,  positive ETCO2 and breath sounds checked- equal and bilateral Secured at: 22 cm Tube secured with: Tape Dental Injury: Teeth and Oropharynx as per pre-operative assessment

## 2016-06-27 NOTE — Interval H&P Note (Signed)
History and Physical Interval Note:  06/27/2016 8:24 AM  Anthony Byrd  has presented today for surgery, with the diagnosis of RIGHT TIBIAL PLATEAU FRACTURE  The various methods of treatment have been discussed with the patient and family. After consideration of risks, benefits and other options for treatment, the patient has consented to  Procedure(s): REMOVAL EXTERNAL FIXATION LEG (Right) OPEN REDUCTION INTERNAL FIXATION (ORIF) TIBIAL PLATEAU (Right) as a surgical intervention .  The patient's history has been reviewed, patient examined, no change in status, stable for surgery.  I have reviewed the patient's chart and labs.  Questions were answered to the patient's satisfaction.     Anthony Byrd

## 2016-06-27 NOTE — Progress Notes (Signed)
PT Cancellation Note  Patient Details Name: Anthony Byrd MRN: [id number] DOB: [birth year]   Cancelled Treatment:    Reason Eval/Treat Not Completed: Patient at procedure, will follow as able.    Cassell Clement, PT, CSCS Pager [phone] Office [phone]  06/27/2016, 9:48 AM
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
## 2016-06-27 NOTE — OR Nursing (Signed)
Anthony Byrd continues to have c/o 10/10 pain when awake.  Dr. Landry Dyke notified of pain despite Dilaudid 2mg  IV, Oxy 10 mg IR, and Toradol 15 mg IV.  Pt does rest comfortably for short periods of time.

## 2016-06-27 NOTE — Anesthesia Postprocedure Evaluation (Signed)
Anesthesia Post Note  Patient: Anthony Byrd  Procedure(s) Performed: Procedure(s) (LRB): REMOVAL EXTERNAL FIXATION LEG (Right) OPEN REDUCTION INTERNAL FIXATION (ORIF) TIBIAL PLATEAU (Right)  Patient location during evaluation: PACU Anesthesia Type: General Level of consciousness: awake and alert Pain management: pain level controlled Vital Signs Assessment: post-procedure vital signs reviewed and stable Respiratory status: spontaneous breathing, nonlabored ventilation, respiratory function stable and patient connected to nasal cannula oxygen Cardiovascular status: blood pressure returned to baseline and stable Postop Assessment: no signs of nausea or vomiting Anesthetic complications: no    Last Vitals:  Vitals:   06/27/16 1238 06/27/16 1248  BP: (!) 170/92 (!) 173/93  Pulse:  81  Resp:  14  Temp:  36.8 C    Last Pain:  Vitals:   06/27/16 1248  TempSrc: Oral  PainSc:                  [name]
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
## 2016-06-27 NOTE — Discharge Instructions (Signed)
Elevate leg and apply ice as frequently as possible.    Diet: As you were doing prior to hospitalization   Shower:  May shower but keep the wounds dry, use an occlusive plastic wrap, NO SOAKING IN TUB.  If the bandage gets wet, change with a clean dry gauze.  Leave dressings in place and keep the splint dry with a plastic bag.  Dressing:  Leave dressings in place and we will change your bandages during your first follow-up appointment.    Activity:  Increase activity slowly as tolerated, but follow the weight bearing instructions below.  The rules on driving is that you can not be taking narcotics while you drive, and you must feel in control of the vehicle.    Weight Bearing:   Non Weight bearing Right Leg.  To prevent constipation: you may use a stool softener such as -  Colace (over the counter) 100 mg by mouth twice a day  Drink plenty of fluids (prune juice may be helpful) and high fiber foods Miralax (over the counter) for constipation as needed.    Itching:  If you experience itching with your medications, try taking only a single pain pill, or even half a pain pill at a time.  You may take up to 10 pain pills per day, and you can also use benadryl over the counter for itching or also to help with sleep.   Precautions:  If you experience chest pain or shortness of breath - call 911 immediately for transfer to the hospital emergency department!!  If you develop a fever greater that 101 F, purulent drainage from wound, increased redness or drainage from wound, or calf pain -- Call the office at [phone]                                                 Follow- Up Appointment:  Please call for an appointment to be seen in 2 weeks [place] - [phone]
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
## 2016-06-27 NOTE — Transfer of Care (Signed)
Immediate Anesthesia Transfer of Care Note  Patient: Anthony Byrd  Procedure(s) Performed: Procedure(s): REMOVAL EXTERNAL FIXATION LEG (Right) OPEN REDUCTION INTERNAL FIXATION (ORIF) TIBIAL PLATEAU (Right)  Patient Location: PACU  Anesthesia Type:General  Level of Consciousness: awake, alert , oriented and patient cooperative  Airway & Oxygen Therapy: Patient Spontanous Breathing and Patient connected to nasal cannula oxygen  Post-op Assessment: Report given to RN, Post -op Vital signs reviewed and stable and Patient moving all extremities  Post vital signs: Reviewed and stable  Last Vitals:  Vitals:   06/27/16 0546 06/27/16 1120  BP: (!) 119/100 (!) 172/88  Pulse: 79 95  Resp: 16 (!) 9  Temp: 36.8 C 37 C    Last Pain:  Vitals:   06/27/16 0802  TempSrc:   PainSc: 3       Patients Stated Pain Goal: 0 ([id number])  Complications: No apparent anesthesia complications

## 2016-06-28 MED ORDER — SODIUM CHLORIDE 0.9% FLUSH: 3.0000 mL | INTRAVENOUS | Status: DC | PRN

## 2016-06-28 NOTE — Plan of Care (Signed)
Problem: Safety: Goal: Ability to remain free from injury will improve Outcome: Progressing No fall or injuries noted this shift.   Problem: Physical Regulation: Goal: Will remain free from infection Outcome: Progressing No s/s of infection noted  Problem: Tissue Perfusion: Goal: Risk factors for ineffective tissue perfusion will decrease Outcome: Progressing No s/s of dvt noted  Problem: Activity: Goal: Risk for activity intolerance will decrease Outcome: Progressing Was up in the chair for hours and tolerated well  Problem: Fluid Volume: Goal: Ability to maintain a balanced intake and output will improve Outcome: Progressing Good intake and output  Problem: Nutrition: Goal: Adequate nutrition will be maintained Outcome: Progressing Adequate nutrition  Problem: Bowel/Gastric: Goal: Will not experience complications related to bowel motility Outcome: Progressing No bowel issues reported

## 2016-06-28 NOTE — Progress Notes (Signed)
npatient Rehabilitation  Per PTA request patient was screened by Gunnar Fusi for appropriateness for an Inpatient Acute Rehab consult.  At this time note that a consult has been ordered.  Plan for admissions coordinator to follow up after MD has completed consult.    Carmelia Roller., CCC/SLP Admission Coordinator  [facility]  Cell [phone]
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
## 2016-06-28 NOTE — Clinical Social Work Placement (Signed)
   CLINICAL SOCIAL WORK PLACEMENT  NOTE  Date:  06/28/2016  Patient Details  Name: Anthony Byrd MRN: [id number] Date of Birth: 1970/12/11  Clinical Social Work is seeking post-discharge placement for this patient at the [place] level of care (*CSW will initial, date and re-position this form in  chart as items are completed):  Yes   Patient/family provided with [facility] Work Department's list of facilities offering this level of care within the geographic area requested by the patient (or if unable, by the patient's family).  Yes   Patient/family informed of their freedom to choose among providers that offer the needed level of care, that participate in Medicare, Medicaid or managed care program needed by the patient, have an available bed and are willing to accept the patient.  Yes   Patient/family informed of [facility]'s ownership interest in [facility] and [facility], as well as of the fact that they are under no obligation to receive care at these facilities.  PASRR submitted to EDS on 06/28/16     PASRR number received on 06/28/16     Existing PASRR number confirmed on       FL2 transmitted to all facilities in geographic area requested by pt/family on 06/28/16     FL2 transmitted to all facilities within larger geographic area on       Patient informed that his/her managed care company has contracts with or will negotiate with certain facilities, including the following:            Patient/family informed of bed offers received.  Patient chooses bed at       Physician recommends and patient chooses bed at      Patient to be transferred to   on  .  Patient to be transferred to facility by       Patient family notified on   of transfer.  Name of family member notified:        PHYSICIAN Please sign FL2     Additional Comment:    _______________________________________________ Candie Chroman, LCSW 06/28/2016, 4:00  PM
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
## 2016-06-29 ENCOUNTER — Inpatient Hospital Stay (HOSPITAL_COMMUNITY): Payer: Medicaid - Out of State

## 2016-06-29 ENCOUNTER — Encounter (HOSPITAL_COMMUNITY): Payer: Self-pay | Admitting: Orthopedic Surgery

## 2016-06-29 ENCOUNTER — Ambulatory Visit: Payer: Self-pay | Admitting: Orthopedic Surgery

## 2016-06-29 DIAGNOSIS — S93402A Sprain of unspecified ligament of left ankle, initial encounter: Secondary | ICD-10-CM

## 2016-06-29 DIAGNOSIS — S43102A Unspecified dislocation of left acromioclavicular joint, initial encounter: Secondary | ICD-10-CM

## 2016-06-29 DIAGNOSIS — S82292A Other fracture of shaft of left tibia, initial encounter for closed fracture: Secondary | ICD-10-CM
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
## 2016-06-29 NOTE — Progress Notes (Signed)
Orthopedic Tech Progress Note Patient Details:  Anthony Byrd 09/06/1971 [id number]  Ortho Devices Type of Ortho Device: CAM walker Ortho Device/Splint Location: lle Ortho Device/Splint Interventions: Application   Amberlee Garvey 06/29/2016, 1:51 PM
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
## 2016-06-30 DIAGNOSIS — S82892A Other fracture of left lower leg, initial encounter for closed fracture: Secondary | ICD-10-CM

## 2016-06-30 HISTORY — DX: Other fracture of left lower leg, initial encounter for closed fracture: S82.892A

## 2016-06-30 MED ORDER — ACETAMINOPHEN 500 MG PO TABS: 1000.0000 mg | ORAL_TABLET | ORAL | Status: DC

## 2016-06-30 MED ORDER — CEFAZOLIN SODIUM-DEXTROSE 2-4 GM/100ML-% IV SOLN
2.0000 g | INTRAVENOUS | Status: AC
  Administered 2016-07-03: 2 g via INTRAVENOUS
  Filled 2016-06-30: qty 100

## 2016-06-30 NOTE — Clinical Social Work Note (Signed)
Clinical Social Worker continuing to follow patient and family for support and discharge planning needs.  Patient is scheduled to return to the OR on Monday and continued to be evaluated for a potential inpatient rehab admission.  CSW has initiated SNF search to potential facilities, however no offers yet.  CSW remains available for support and will assist with discharge planning if deemed appropriate at time of discharge.  Barbette Or, [place]
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
## 2016-06-30 NOTE — Progress Notes (Signed)
Rehab admissions - Please see rehab consult done by Dr. Letta Pate yesterday.  Noted patient to go for surgery on Monday.  We will follow up once surgery is completed for potential acute inpatient rehab admission.  Call me for questions.  [phone]
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
## 2016-07-03 ENCOUNTER — Inpatient Hospital Stay (HOSPITAL_COMMUNITY): Payer: Medicaid - Out of State | Admitting: Certified Registered Nurse Anesthetist

## 2016-07-03 ENCOUNTER — Encounter (HOSPITAL_COMMUNITY): Payer: Self-pay | Admitting: Certified Registered Nurse Anesthetist

## 2016-07-03 ENCOUNTER — Encounter (HOSPITAL_COMMUNITY): Admission: EM | Disposition: A | Discharge status: Home / Self Care | Payer: Self-pay | Source: Home / Self Care

## 2016-07-03 HISTORY — PX: ORIF ANKLE FRACTURE: SHX5408

## 2016-07-03 SURGERY — OPEN REDUCTION INTERNAL FIXATION (ORIF) ANKLE FRACTURE
Anesthesia: General | Site: Ankle | Laterality: Left

## 2016-07-03 MED ORDER — FENTANYL CITRATE (PF) 100 MCG/2ML IJ SOLN
INTRAMUSCULAR | Status: DC
Start: 2016-07-03 — End: 2016-07-03
  Filled 2016-07-03: qty 2

## 2016-07-03 MED ORDER — PROMETHAZINE HCL 25 MG/ML IJ SOLN
INTRAMUSCULAR | Status: AC
  Administered 2016-07-03: 6.25 mg via INTRAVENOUS
  Filled 2016-07-03: qty 1

## 2016-07-03 MED ORDER — DEXTROSE-NACL 5-0.45 % IV SOLN
100.0000 mL/h | INTRAVENOUS | Status: DC
  Administered 2016-07-03: 100 mL/h via INTRAVENOUS

## 2016-07-03 MED ORDER — PROPOFOL 10 MG/ML IV BOLUS
INTRAVENOUS | Status: DC | PRN
  Administered 2016-07-03: 200 mg via INTRAVENOUS

## 2016-07-03 MED ORDER — FENTANYL CITRATE (PF) 250 MCG/5ML IJ SOLN
INTRAMUSCULAR | Status: AC
  Filled 2016-07-03: qty 5

## 2016-07-03 MED ORDER — PHENYLEPHRINE 40 MCG/ML (10ML) SYRINGE FOR IV PUSH (FOR BLOOD PRESSURE SUPPORT)
PREFILLED_SYRINGE | INTRAVENOUS | Status: AC
  Filled 2016-07-03: qty 10

## 2016-07-03 MED ORDER — PHENYLEPHRINE 40 MCG/ML (10ML) SYRINGE FOR IV PUSH (FOR BLOOD PRESSURE SUPPORT)
PREFILLED_SYRINGE | INTRAVENOUS | Status: DC | PRN
  Administered 2016-07-03 (×3): 80 ug via INTRAVENOUS

## 2016-07-03 MED ORDER — ONDANSETRON HCL 4 MG/2ML IJ SOLN
INTRAMUSCULAR | Status: AC
  Filled 2016-07-03: qty 2

## 2016-07-03 MED ORDER — MIDAZOLAM HCL 2 MG/2ML IJ SOLN
INTRAMUSCULAR | Status: AC
  Filled 2016-07-03: qty 2

## 2016-07-03 MED ORDER — FENTANYL CITRATE (PF) 100 MCG/2ML IJ SOLN
25.0000 ug | INTRAMUSCULAR | Status: DC | PRN
  Administered 2016-07-03 (×2): 50 ug via INTRAVENOUS

## 2016-07-03 MED ORDER — LACTATED RINGERS IV SOLN
INTRAVENOUS | Status: DC
  Administered 2016-07-03: 50 mL/h via INTRAVENOUS
  Administered 2016-07-03: 23:00:00 via INTRAVENOUS

## 2016-07-03 MED ORDER — FENTANYL CITRATE (PF) 100 MCG/2ML IJ SOLN
INTRAMUSCULAR | Status: DC | PRN
  Administered 2016-07-03 (×3): 50 ug via INTRAVENOUS

## 2016-07-03 MED ORDER — 0.9 % SODIUM CHLORIDE (POUR BTL) OPTIME
TOPICAL | Status: DC | PRN
  Administered 2016-07-03: 1000 mL

## 2016-07-03 MED ORDER — MIDAZOLAM HCL 5 MG/5ML IJ SOLN
INTRAMUSCULAR | Status: DC | PRN
  Administered 2016-07-03: 2 mg via INTRAVENOUS

## 2016-07-03 MED ORDER — PROPOFOL 10 MG/ML IV BOLUS
INTRAVENOUS | Status: AC
  Filled 2016-07-03: qty 40

## 2016-07-03 MED ORDER — PROMETHAZINE HCL 25 MG/ML IJ SOLN
6.2500 mg | INTRAMUSCULAR | Status: DC | PRN
  Administered 2016-07-03: 6.25 mg via INTRAVENOUS

## 2016-07-03 MED ORDER — LIDOCAINE 2% (20 MG/ML) 5 ML SYRINGE
INTRAMUSCULAR | Status: DC | PRN
Start: 2016-07-03 — End: 2016-07-03
  Administered 2016-07-03: 100 mg via INTRAVENOUS

## 2016-07-03 MED ORDER — FENTANYL CITRATE (PF) 100 MCG/2ML IJ SOLN
INTRAMUSCULAR | Status: AC
  Administered 2016-07-03: 50 ug via INTRAVENOUS
  Filled 2016-07-03: qty 2

## 2016-07-03 SURGICAL SUPPLY — 71 items
BANDAGE ELASTIC 4 VELCRO ST LF (GAUZE/BANDAGES/DRESSINGS) ×3 IMPLANT
BANDAGE ESMARK 6X9 LF (GAUZE/BANDAGES/DRESSINGS) ×1 IMPLANT
BIT DRILL CANN 2.7 (BIT) ×1
BIT DRILL CANN 2.7MM (BIT) ×1
BIT DRILL SRG 2.7XCANN AO CPLG (BIT) ×1 IMPLANT
BIT DRL SRG 2.7XCANN AO CPLNG (BIT) ×1
BNDG COHESIVE 4X5 TAN STRL (GAUZE/BANDAGES/DRESSINGS) ×3 IMPLANT
BNDG ELASTIC 6X15 VLCR STRL LF (GAUZE/BANDAGES/DRESSINGS) ×3 IMPLANT
BNDG ESMARK 6X9 LF (GAUZE/BANDAGES/DRESSINGS) ×3
BNDG GAUZE ELAST 4 BULKY (GAUZE/BANDAGES/DRESSINGS) ×3 IMPLANT
CANISTER SUCTION 2500CC (MISCELLANEOUS) IMPLANT
CLOSURE WOUND 1/2 X4 (GAUZE/BANDAGES/DRESSINGS)
COVER SURGICAL LIGHT HANDLE (MISCELLANEOUS) ×3 IMPLANT
CUFF TOURNIQUET SINGLE 34IN LL (TOURNIQUET CUFF) ×3 IMPLANT
DRAPE C-ARM 42X72 X-RAY (DRAPES) IMPLANT
DRAPE C-ARMOR (DRAPES) IMPLANT
DRAPE OEC MINIVIEW 54X84 (DRAPES) ×3 IMPLANT
DRAPE ORTHO SPLIT 77X108 STRL (DRAPES)
DRAPE SURG ORHT 6 SPLT 77X108 (DRAPES) IMPLANT
DRAPE U-SHAPE 47X51 STRL (DRAPES) ×3 IMPLANT
DRSG ADAPTIC 3X8 NADH LF (GAUZE/BANDAGES/DRESSINGS) ×3 IMPLANT
DRSG MEPILEX BORDER 4X12 (GAUZE/BANDAGES/DRESSINGS) ×3 IMPLANT
DRSG MEPILEX BORDER 4X4 (GAUZE/BANDAGES/DRESSINGS) ×9 IMPLANT
DRSG PAD ABDOMINAL 8X10 ST (GAUZE/BANDAGES/DRESSINGS) ×3 IMPLANT
DURAPREP 26ML APPLICATOR (WOUND CARE) ×3 IMPLANT
ELECT REM PT RETURN 9FT ADLT (ELECTROSURGICAL) ×3
ELECTRODE REM PT RTRN 9FT ADLT (ELECTROSURGICAL) ×1 IMPLANT
GAUZE SPONGE 4X4 12PLY STRL (GAUZE/BANDAGES/DRESSINGS) ×3 IMPLANT
GLOVE BIO SURGEON STRL SZ7 (GLOVE) ×3 IMPLANT
GLOVE BIO SURGEON STRL SZ7.5 (GLOVE) ×6 IMPLANT
GLOVE BIO SURGEON STRL SZ8 (GLOVE) ×3 IMPLANT
GLOVE BIOGEL PI IND STRL 7.0 (GLOVE) ×1 IMPLANT
GLOVE BIOGEL PI IND STRL 8 (GLOVE) ×1 IMPLANT
GLOVE BIOGEL PI INDICATOR 7.0 (GLOVE) ×2
GLOVE BIOGEL PI INDICATOR 8 (GLOVE) ×2
GLOVE BIOGEL PI ORTHO PRO SZ8 (GLOVE)
GLOVE PI ORTHO PRO STRL SZ8 (GLOVE) IMPLANT
GOWN STRL REUS W/ TWL LRG LVL3 (GOWN DISPOSABLE) ×2 IMPLANT
GOWN STRL REUS W/ TWL XL LVL3 (GOWN DISPOSABLE) ×1 IMPLANT
GOWN STRL REUS W/TWL 2XL LVL3 (GOWN DISPOSABLE) IMPLANT
GOWN STRL REUS W/TWL LRG LVL3 (GOWN DISPOSABLE) ×4
GOWN STRL REUS W/TWL XL LVL3 (GOWN DISPOSABLE) ×2
K-WIRE 1.4 (WIRE) ×6 IMPLANT
KIT BASIN OR (CUSTOM PROCEDURE TRAY) ×3 IMPLANT
KIT ROOM TURNOVER OR (KITS) ×3 IMPLANT
MANIFOLD NEPTUNE II (INSTRUMENTS) IMPLANT
NEEDLE 22X1 1/2 (OR ONLY) (NEEDLE) IMPLANT
NS IRRIG 1000ML POUR BTL (IV SOLUTION) ×3 IMPLANT
PACK ORTHO EXTREMITY (CUSTOM PROCEDURE TRAY) ×3 IMPLANT
PAD ARMBOARD 7.5X6 YLW CONV (MISCELLANEOUS) ×6 IMPLANT
PAD CAST 4YDX4 CTTN HI CHSV (CAST SUPPLIES) IMPLANT
PADDING CAST COTTON 4X4 STRL (CAST SUPPLIES)
SCREW CANN 44X15X4XSLF DRL (Screw) ×2 IMPLANT
SCREW CANNULATED 4.0X44MM (Screw) ×4 IMPLANT
SPONGE LAP 18X18 X RAY DECT (DISPOSABLE) ×3 IMPLANT
STRIP CLOSURE SKIN 1/2X4 (GAUZE/BANDAGES/DRESSINGS) IMPLANT
SUCTION FRAZIER HANDLE 10FR (MISCELLANEOUS)
SUCTION TUBE FRAZIER 10FR DISP (MISCELLANEOUS) IMPLANT
SUT MNCRL AB 4-0 PS2 18 (SUTURE) IMPLANT
SUT MON AB 2-0 CT1 27 (SUTURE) IMPLANT
SUT VIC AB 0 CT1 27 (SUTURE)
SUT VIC AB 0 CT1 27XBRD ANBCTR (SUTURE) IMPLANT
SYR BULB IRRIGATION 50ML (SYRINGE) IMPLANT
SYR CONTROL 10ML LL (SYRINGE) IMPLANT
TOWEL OR 17X24 6PK STRL BLUE (TOWEL DISPOSABLE) ×3 IMPLANT
TOWEL OR 17X26 10 PK STRL BLUE (TOWEL DISPOSABLE) ×3 IMPLANT
TUBE CONNECTING 12'X1/4 (SUCTIONS) ×1
TUBE CONNECTING 12X1/4 (SUCTIONS) ×2 IMPLANT
UNDERPAD 30X30 INCONTINENT (UNDERPADS AND DIAPERS) ×6 IMPLANT
WATER STERILE IRR 1000ML POUR (IV SOLUTION) ×3 IMPLANT
YANKAUER SUCT BULB TIP NO VENT (SUCTIONS) IMPLANT
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
## 2016-07-03 NOTE — Progress Notes (Signed)
Pt transported off unit to OR for surgery. Delia Heady RN
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
## 2016-07-03 NOTE — Progress Notes (Signed)
Noted for surgery today. I will follow up postop with pt to assist with dispo plans. [id number]
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
## 2016-07-03 NOTE — Progress Notes (Signed)
Pt back to the unit from pacu; pt responds to voices and answer questions appropriately. Pt VSS; BLE incision compression wrap dsg remains clean, dry and intact with no stain or active bleeding noted. Bledsoe brace remains on to RLE and Boot remains on to LLE. Pt due to void; pt neuro check wnl to BLE; pt resting comfortably in bed with call light within reach. Will continue to closely monitor. Delia Heady RN

## 2016-07-03 NOTE — Clinical Social Work Note (Signed)
Clinical Social Worker continuing to follow patient and family for support and discharge planning needs.  Patient currently in surgery today and awaiting weight bearing restrictions.  Inpatient rehab still following patient for potential admission - CSW continuing to pursue possible SNF placement if needed.  CSW remains available for support and to facilitate patient discharge needs once medically stable.  Barbette Or, [place]

## 2016-07-03 NOTE — Anesthesia Postprocedure Evaluation (Signed)
Anesthesia Post Note  Patient: Anthony Byrd  Procedure(s) Performed: Procedure(s) (LRB): OPEN REDUCTION INTERNAL FIXATION (ORIF) ANKLE FRACTURE; DRESSING CHANGE RIGHT LEG (Left)  Patient location during evaluation: PACU Anesthesia Type: General Level of consciousness: awake and alert Pain management: pain level controlled Vital Signs Assessment: post-procedure vital signs reviewed and stable Respiratory status: spontaneous breathing, nonlabored ventilation, respiratory function stable and patient connected to nasal cannula oxygen Cardiovascular status: blood pressure returned to baseline and stable Postop Assessment: no signs of nausea or vomiting Anesthetic complications: no    Last Vitals:  Vitals:   07/03/16 1700 07/03/16 1720  BP: (!) 146/84 (!) 152/86  Pulse: 85 96  Resp: 14 16  Temp: 36.7 C 36.8 C    Last Pain:  Vitals:   07/03/16 1720  TempSrc: Oral  PainSc:                  Tiajuana Amass

## 2016-07-03 NOTE — Transfer of Care (Signed)
Immediate Anesthesia Transfer of Care Note  Patient: Anthony Byrd  Procedure(s) Performed: Procedure(s): OPEN REDUCTION INTERNAL FIXATION (ORIF) ANKLE FRACTURE; DRESSING CHANGE RIGHT LEG (Left)  Patient Location: PACU  Anesthesia Type:General  Level of Consciousness: awake, oriented and patient cooperative  Airway & Oxygen Therapy: Patient Spontanous Breathing and Patient connected to nasal cannula oxygen  Post-op Assessment: Report given to RN and Post -op Vital signs reviewed and stable  Post vital signs: Reviewed  Last Vitals:  Vitals:   07/02/16 2030 07/03/16 0636  BP: (!) 154/85 140/84  Pulse: 92 82  Resp: 16 16  Temp: 36.4 C 36.6 C    Last Pain:  Vitals:   07/03/16 1102  TempSrc:   PainSc: 4       Patients Stated Pain Goal: 2 ([id number])  Complications: No apparent anesthesia complications

## 2016-07-03 NOTE — Anesthesia Procedure Notes (Signed)
Procedure Name: LMA Insertion Date/Time: 07/03/2016 3:39 PM Performed by: Luciana Axe K Pre-anesthesia Checklist: Patient identified, Emergency Drugs available, Suction available and Patient being monitored Patient Re-evaluated:Patient Re-evaluated prior to inductionOxygen Delivery Method: Circle System Utilized Preoxygenation: Pre-oxygenation with 100% oxygen Intubation Type: IV induction Ventilation: Mask ventilation without difficulty LMA: LMA inserted LMA Size: 4.0 Number of attempts: 1 Airway Equipment and Method: Bite block Placement Confirmation: positive ETCO2 Tube secured with: Tape Dental Injury: Teeth and Oropharynx as per pre-operative assessment
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
## 2016-07-03 NOTE — Anesthesia Preprocedure Evaluation (Addendum)
Anesthesia Evaluation  Patient identified by MRN, date of birth, ID band Patient awake    Reviewed: Allergy & Precautions, NPO status , Patient's Chart, lab work & pertinent test results  History of Anesthesia Complications Negative for: history of anesthetic complications  Airway Mallampati: III  TM Distance: >3 FB Neck ROM: Full    Dental  (+) Teeth Intact, Dental Advisory Given   Pulmonary neg shortness of breath, sleep apnea (has lost 70 pounds since diagnosis and no longer feels this is an issue) , neg COPD, neg recent URI,    Pulmonary exam normal breath sounds clear to auscultation       Cardiovascular Exercise Tolerance: Good (-) angina(-) Past MI, (-) Cardiac Stents and (-) CABG negative cardio ROS   Rhythm:Regular Rate:Normal     Neuro/Psych neg Seizures negative neurological ROS     GI/Hepatic negative GI ROS, Neg liver ROS, neg GERD  ,  Endo/Other  negative endocrine ROS  Renal/GU negative Renal ROS     Musculoskeletal   Abdominal (+) + obese,   Peds  Hematology negative hematology ROS (+)   Anesthesia Other Findings Gout, h/o testicular cancer  Reproductive/Obstetrics                            Anesthesia Physical Anesthesia Plan  ASA: II  Anesthesia Plan: General   Post-op Pain Management:    Induction: Intravenous  Airway Management Planned: LMA  Additional Equipment:   Intra-op Plan:   Post-operative Plan: Extubation in OR  Informed Consent: I have reviewed the patients History and Physical, chart, labs and discussed the procedure including the risks, benefits and alternatives for the proposed anesthesia with the patient or authorized representative who has indicated his/her understanding and acceptance.   Dental advisory given  Plan Discussed with:   Anesthesia Plan Comments:        Anesthesia Quick Evaluation

## 2016-07-04 ENCOUNTER — Encounter (HOSPITAL_COMMUNITY): Payer: Self-pay | Admitting: Orthopedic Surgery
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
## 2016-07-04 NOTE — Progress Notes (Signed)
Pt's IV accidentally removed. Pt refusing IV restart because "he's not really using it" and eating/drinking well. Paged on call MD (Dr. Georgette Dover) with update and received verbal confirmation that pt does not need another IV site. Will pass along to night RN. Will continue to monitor
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
## 2016-07-04 NOTE — Plan of Care (Signed)
Problem: Activity: Goal: Will remain free from falls Outcome: Progressing No fall or injury noted, safety prcautions and fall preventions maintained  Problem: Physical Regulation: Goal: Postoperative complications will be avoided or minimized Outcome: Progressing No post op complications noted

## 2016-07-04 NOTE — Progress Notes (Signed)
I met with pt and P.T. In gym to discuss his rehab venue options. [name] that we can admit pts who are uninsured, but it would be a cost to him. Financial counselor is following and pt not deemed to be Medicaid [place] eligible. Pt states he has Medicaid of [place] but has not provided that information . I have requested that he call to get the number and provide, but that it is unlikely to cover services in [place]. We have agreed to follow up at 2 pm today to discuss cost of rehab and that I would discuss with Rehab MD pt's current functional level postop today to clarify if pt is CIR admission appropriate. [phone]

## 2016-07-04 NOTE — Progress Notes (Signed)
I have verified pt has [place] # [id number]. I contacted [facility] to request approval for inpt rehab at [phone]. I have notified financial counselor, SW and Care management department for clinicals to be sent for approval for acute hospital admit also. Pt is aware. I will not be notified of decision today. Typically 24 hrs. I will follow up tomorrow. [id number]
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
## 2016-07-05 NOTE — Progress Notes (Signed)
CIR liaison unable to get an answer from [facility] for [facility] admission; appears they have no contract for [facility] rehab facility.  PT/OT continue to recommend CIR for pt.  Notified CSW Lorriane Shire to see if pt would qualify for any assistance with SNF placement through possible LOG bed.  She states she will follow up with me after speaking with supervisor.    Reinaldo Raddle, RN, BSN  Trauma/Neuro ICU Case Manager [phone]
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
## 2016-07-05 NOTE — Progress Notes (Addendum)
[place]icaid [place] has no decision concerning contracting in [place] for pt's inpt rehab admission. They can not make decision today. I reviewed pt's PT and OT treatments from today and now recommend home with [facility]. I spoke with pt and he is aware.I notified RN CM. We will sign off. Please call me with any questions. [phone]  Patient at supervision level with OT today and able to do on bathing and dressing at wheelchair level. I discussed with OT and HH is recommended.

## 2016-07-06 ENCOUNTER — Encounter (INDEPENDENT_AMBULATORY_CARE_PROVIDER_SITE_OTHER): Payer: Self-pay | Admitting: Physician Assistant

## 2016-07-06 MED ORDER — DOCUSATE SODIUM 100 MG PO CAPS: 100.0000 mg | ORAL_CAPSULE | Freq: Two times a day (BID) | ORAL | 0 refills | Status: DC

## 2016-07-06 MED ORDER — OXYCODONE HCL 5 MG PO TABS
5.0000 mg | ORAL_TABLET | ORAL | Status: DC | PRN
  Administered 2016-07-06: 15 mg via ORAL
  Filled 2016-07-06: qty 3

## 2016-07-06 MED ORDER — ASPIRIN EC 325 MG PO TBEC: 325.0000 mg | DELAYED_RELEASE_TABLET | Freq: Every day | ORAL | 0 refills | Status: DC

## 2016-07-06 MED ORDER — OXYCODONE HCL 5 MG PO TABS: ORAL_TABLET | ORAL | 0 refills | Status: DC

## 2016-07-06 MED ORDER — ONDANSETRON HCL 4 MG PO TABS: 4.0000 mg | ORAL_TABLET | Freq: Four times a day (QID) | ORAL | 0 refills | Status: DC | PRN

## 2016-07-06 MED ORDER — ACETAMINOPHEN 325 MG PO TABS: 650.0000 mg | ORAL_TABLET | Freq: Four times a day (QID) | ORAL | 0 refills | Status: DC | PRN

## 2016-07-06 MED ORDER — METHOCARBAMOL 500 MG PO TABS: 500.0000 mg | ORAL_TABLET | Freq: Three times a day (TID) | ORAL | 0 refills | Status: DC

## 2016-07-06 MED ORDER — CELECOXIB 200 MG PO CAPS: 200.0000 mg | ORAL_CAPSULE | Freq: Two times a day (BID) | ORAL | 0 refills | Status: DC

## 2016-07-06 MED ORDER — OMEPRAZOLE 20 MG PO CPDR: 20.0000 mg | DELAYED_RELEASE_CAPSULE | Freq: Every day | ORAL | 0 refills | Status: DC

## 2016-07-06 NOTE — Progress Notes (Signed)
Patient has spoke with Almyra Free about his medications and is ready for discharge.
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
## 2016-07-06 NOTE — Care Management Note (Addendum)
Case Management Note  Patient Details  Name: Anthony Byrd MRN: [id number] Date of Birth: June 16, 1971  Subjective/Objective:   Pt medically stable for dc home today.  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                   Action/Plan: Spoke with pt, and he has worked out a Secretary/administrator for [employer].  He prefers to dc today.  He will have brother pick him up and take him to sister's home,where he will stay Thurs and Fri nights.  Sister leaves for the beach on Saturday, and a friend is picking him up to stay with them Sat/Sun nights.  His brother will pick him up on Monday, and take him to his home.  He will stay with the brother until he is able to care for himself independently.  Referral to [facility] for DME needs.  Denies need for 3 in 1 and tub seat.  Pt does not have qualifying diagnosis for home health as uninsured pt.    Expected Discharge Date:  0817/2017 Expected Discharge Plan:  Home/Self Care  In-House Referral:  Clinical Social Work  Discharge planning Services  CM Consult, MATCH  Post Acute Care Choice:    Choice offered to:     DME Arranged:  Wheelchair manual, Walker rolling DME Agency:  [employer] [place]:    [facility] Agency:     Status of Service:  Completed, signed off  If discussed at [employer], dates discussed:    Additional Comments: [facility] not contracted with Medicaid of [place], and pt would have to pay full price for DME.  Spoke with Huey Romans, a [employer], and they do accept [employer]; however DME would not be delivered for 2-4 hours after referral.  Brother is here to transport pt home.  He requests to pay [facility] out of pocket ($52) for RW, and send referral for WC only.  Referral faxed to [employer] at  [phone].  Company given brother's address, [address] in [place], [place] [address] for delivery.  [place] letter given with explanation of program benefits, as pt has no drug  coverage.    Reinaldo Raddle, RN, BSN  Trauma/Neuro ICU Case Manager [phone]
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
## 2016-07-06 NOTE — Progress Notes (Addendum)
Reviewed discharge information/medications with patient.  Patient has some questions about getting medications filled.  Waiting for the doctor to call me back.
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
## 2017-06-13 IMAGING — RF DG C-ARM 61-120 MIN
1 series · 2 of 2 positions shown · non-contrast
Comparison: Previous examinations.

CLINICAL DATA: Comminuted tibial plateau fracture.

EXAM:
DG C-ARM 61-120 MIN; PORTABLE RIGHT KNEE - 1-2 VIEW

[Series 1: run · 2 of 2 slices shown]
[im 1/2]
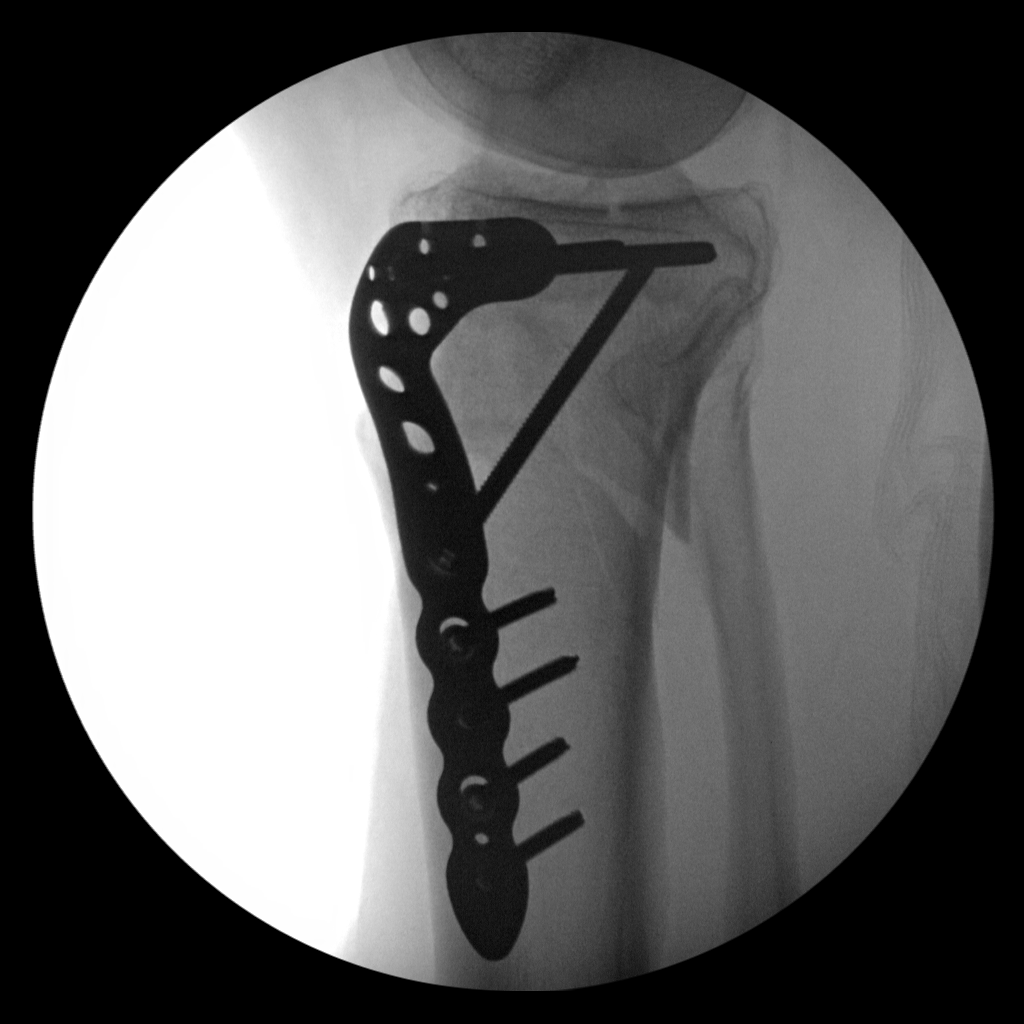
[im 2/2]
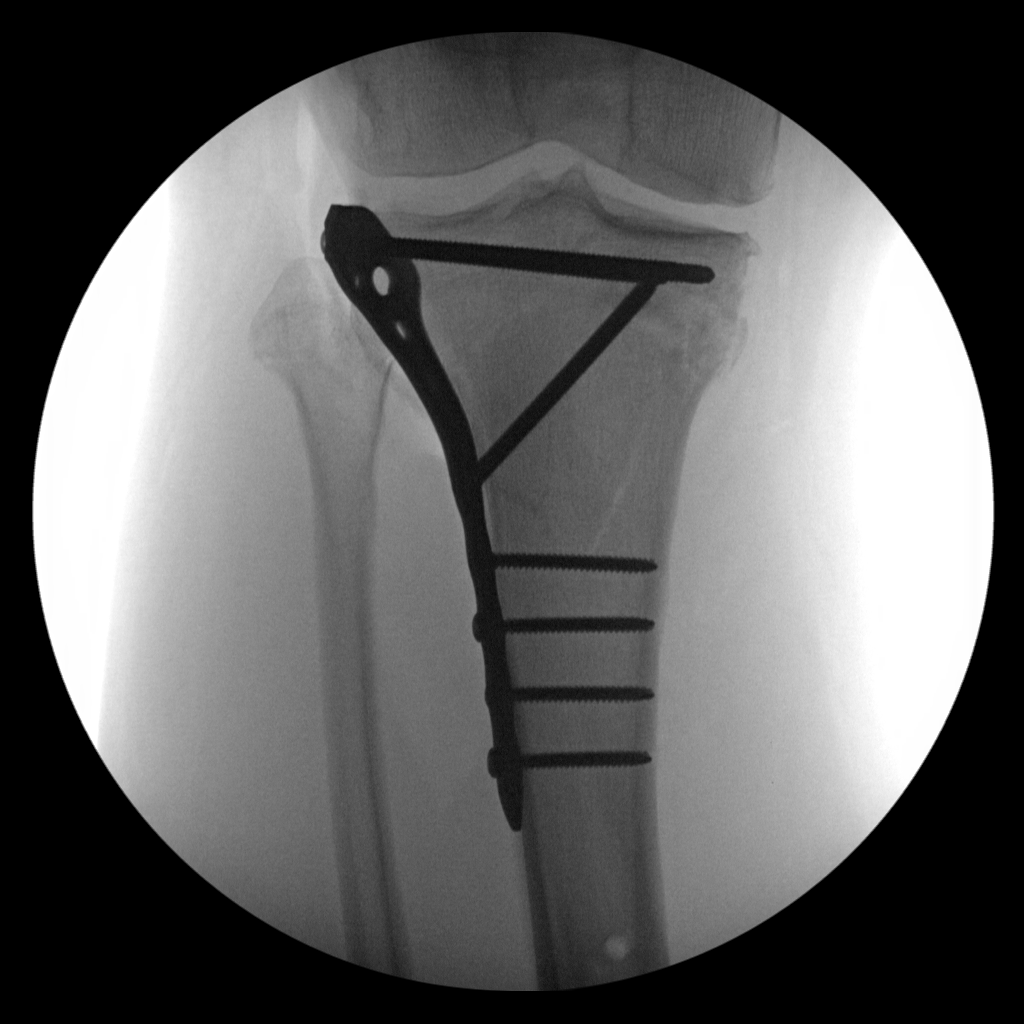

[2 of 2 positions shown; findings below may reference images not displayed]

FINDINGS: AP and lateral C-arm views of the right knee demonstrate interval
screw and plate fixation of the previously demonstrated comminuted
tibial plateau fracture involving the medial and lateral articular
surfaces. Anatomic position and alignment of the major fragments
with no visible depression. The lateral joint space is wider than
the medial joint space. There is mild to moderate medial spur
formation.
IMPRESSION: Hardware fixation of the previously demonstrated comminuted tibial
plateau fracture, as described above.

## 2017-08-14 ENCOUNTER — Ambulatory Visit (HOSPITAL_COMMUNITY)
Admission: EM | Admit: 2017-08-14 | Discharge: 2017-08-14 | Disposition: A | Discharge status: Home / Self Care | Payer: Self-pay | Attending: Emergency Medicine | Admitting: Emergency Medicine

## 2017-08-14 ENCOUNTER — Ambulatory Visit (INDEPENDENT_AMBULATORY_CARE_PROVIDER_SITE_OTHER): Payer: Self-pay

## 2017-08-14 ENCOUNTER — Encounter (HOSPITAL_COMMUNITY): Payer: Self-pay | Admitting: Emergency Medicine

## 2017-08-14 DIAGNOSIS — S90212A Contusion of left great toe with damage to nail, initial encounter: Secondary | ICD-10-CM

## 2017-08-14 DIAGNOSIS — Z23 Encounter for immunization: Secondary | ICD-10-CM

## 2017-08-14 DIAGNOSIS — S92422A Displaced fracture of distal phalanx of left great toe, initial encounter for closed fracture: Secondary | ICD-10-CM

## 2017-08-14 DIAGNOSIS — W208XXA Other cause of strike by thrown, projected or falling object, initial encounter: Secondary | ICD-10-CM

## 2017-08-14 MED ORDER — TRAMADOL HCL 50 MG PO TABS: 50.0000 mg | ORAL_TABLET | Freq: Four times a day (QID) | ORAL | 0 refills | Status: DC | PRN

## 2017-08-14 MED ORDER — TETANUS-DIPHTH-ACELL PERTUSSIS 5-2.5-18.5 LF-MCG/0.5 IM SUSP
INTRAMUSCULAR | Status: AC
  Filled 2017-08-14: qty 0.5

## 2017-08-14 MED ORDER — TETANUS-DIPHTH-ACELL PERTUSSIS 5-2.5-18.5 LF-MCG/0.5 IM SUSP
0.5000 mL | Freq: Once | INTRAMUSCULAR | Status: AC
  Administered 2017-08-14: 0.5 mL via INTRAMUSCULAR

## 2017-08-14 MED ORDER — CEPHALEXIN 500 MG PO CAPS: 500.0000 mg | ORAL_CAPSULE | Freq: Four times a day (QID) | ORAL | 0 refills | Status: DC
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
## 2017-08-14 NOTE — ED Triage Notes (Signed)
PT dropped a jack on his left great toe on Friday. PT reports his toenail is now falling off.

## 2017-08-14 NOTE — Discharge Instructions (Signed)
Keep the toe clean with soap and water daily. May place a Band-Aid over the area to maintain stability of the toenail. Take the antibiotic as directed. If you notice increased redness or swelling, pus, red streaks or other evidence of infection you will need to seek medical attention promptly, preferably the orthopedic surgeon or the emergency department. Use the wooden shoe when walking. Follow-up with the orthopedic surgeon as listed above sometime next week. Keep the foot elevated.

## 2018-11-20 DIAGNOSIS — I4819 Other persistent atrial fibrillation: Secondary | ICD-10-CM

## 2018-11-20 HISTORY — DX: Other persistent atrial fibrillation: I48.19

## 2018-11-22 ENCOUNTER — Other Ambulatory Visit: Payer: Self-pay

## 2018-11-22 ENCOUNTER — Inpatient Hospital Stay (HOSPITAL_COMMUNITY)
Admission: EM | Admit: 2018-11-22 | Discharge: 2018-11-27 | DRG: 308 | Disposition: A | Discharge status: Home / Self Care | Payer: Self-pay | Attending: Oncology | Admitting: Oncology

## 2018-11-22 ENCOUNTER — Encounter (HOSPITAL_COMMUNITY): Payer: Self-pay | Admitting: Emergency Medicine

## 2018-11-22 DIAGNOSIS — I5031 Acute diastolic (congestive) heart failure: Secondary | ICD-10-CM | POA: Diagnosis present

## 2018-11-22 DIAGNOSIS — I4891 Unspecified atrial fibrillation: Secondary | ICD-10-CM

## 2018-11-22 DIAGNOSIS — J069 Acute upper respiratory infection, unspecified: Secondary | ICD-10-CM

## 2018-11-22 DIAGNOSIS — Z91010 Allergy to peanuts: Secondary | ICD-10-CM

## 2018-11-22 DIAGNOSIS — R319 Hematuria, unspecified: Secondary | ICD-10-CM | POA: Diagnosis not present

## 2018-11-22 DIAGNOSIS — Z9221 Personal history of antineoplastic chemotherapy: Secondary | ICD-10-CM

## 2018-11-22 DIAGNOSIS — Z8547 Personal history of malignant neoplasm of testis: Secondary | ICD-10-CM

## 2018-11-22 DIAGNOSIS — I340 Nonrheumatic mitral (valve) insufficiency: Secondary | ICD-10-CM

## 2018-11-22 DIAGNOSIS — Z6841 Body Mass Index (BMI) 40.0 and over, adult: Secondary | ICD-10-CM

## 2018-11-22 DIAGNOSIS — M109 Gout, unspecified: Secondary | ICD-10-CM | POA: Diagnosis present

## 2018-11-22 DIAGNOSIS — Z7982 Long term (current) use of aspirin: Secondary | ICD-10-CM

## 2018-11-22 DIAGNOSIS — Z8249 Family history of ischemic heart disease and other diseases of the circulatory system: Secondary | ICD-10-CM

## 2018-11-22 DIAGNOSIS — I083 Combined rheumatic disorders of mitral, aortic and tricuspid valves: Secondary | ICD-10-CM | POA: Diagnosis present

## 2018-11-22 DIAGNOSIS — G4733 Obstructive sleep apnea (adult) (pediatric): Secondary | ICD-10-CM

## 2018-11-22 DIAGNOSIS — Z833 Family history of diabetes mellitus: Secondary | ICD-10-CM

## 2018-11-22 DIAGNOSIS — I4819 Other persistent atrial fibrillation: Principal | ICD-10-CM | POA: Diagnosis present

## 2018-11-22 HISTORY — DX: Sleep apnea, unspecified: G47.30

## 2018-11-22 HISTORY — DX: Unspecified atrial fibrillation: I48.91

## 2018-11-22 NOTE — ED Triage Notes (Signed)
C/o productive cough with yellow phlegm x 2 weeks.  States tonight it was blood streaked phlegm.  Also reports swelling to abd and lower legs.  SOB with exertion.  Denies chest pain or abd pain.

## 2018-11-23 ENCOUNTER — Emergency Department (HOSPITAL_COMMUNITY): Payer: Self-pay

## 2018-11-23 ENCOUNTER — Inpatient Hospital Stay (HOSPITAL_COMMUNITY): Payer: Self-pay

## 2018-11-23 DIAGNOSIS — E8770 Fluid overload, unspecified: Secondary | ICD-10-CM

## 2018-11-23 DIAGNOSIS — Z9989 Dependence on other enabling machines and devices: Secondary | ICD-10-CM

## 2018-11-23 DIAGNOSIS — I361 Nonrheumatic tricuspid (valve) insufficiency: Secondary | ICD-10-CM

## 2018-11-23 DIAGNOSIS — G4733 Obstructive sleep apnea (adult) (pediatric): Secondary | ICD-10-CM

## 2018-11-23 DIAGNOSIS — M109 Gout, unspecified: Secondary | ICD-10-CM

## 2018-11-23 DIAGNOSIS — I340 Nonrheumatic mitral (valve) insufficiency: Secondary | ICD-10-CM

## 2018-11-23 DIAGNOSIS — I480 Paroxysmal atrial fibrillation: Secondary | ICD-10-CM

## 2018-11-23 DIAGNOSIS — I4891 Unspecified atrial fibrillation: Secondary | ICD-10-CM

## 2018-11-23 DIAGNOSIS — Z91010 Allergy to peanuts: Secondary | ICD-10-CM

## 2018-11-23 DIAGNOSIS — Z9221 Personal history of antineoplastic chemotherapy: Secondary | ICD-10-CM

## 2018-11-23 DIAGNOSIS — Z8547 Personal history of malignant neoplasm of testis: Secondary | ICD-10-CM

## 2018-11-23 LAB — BASIC METABOLIC PANEL
Anion gap: 9 (ref 5–15)
BUN: 14 mg/dL (ref 6–20)
CO2: 24 mmol/L (ref 22–32)
Calcium: 8.6 mg/dL — ABNORMAL LOW (ref 8.9–10.3)
Chloride: 107 mmol/L (ref 98–111)
Creatinine, Ser: 1.14 mg/dL (ref 0.61–1.24)
GFR calc non Af Amer: >60 mL/min (ref >60)
Glucose, Bld: 104 mg/dL — ABNORMAL HIGH (ref 70–99)
Potassium: 4 mmol/L (ref 3.5–5.1)
Sodium: 140 mmol/L (ref 135–145)

## 2018-11-23 LAB — TSH: TSH: 0.907 u[IU]/mL (ref 0.350–4.500)

## 2018-11-23 LAB — MAGNESIUM: Magnesium: 1.9 mg/dL (ref 1.7–2.4)

## 2018-11-23 LAB — I-STAT TROPONIN, ED: Troponin i, poc: 0.02 ng/mL (ref 0.00–0.08)

## 2018-11-23 LAB — CBC
HCT: 45.9 % (ref 39.0–52.0)
Hemoglobin: 14.7 g/dL (ref 13.0–17.0)
MCH: 28.5 pg (ref 26.0–34.0)
MCHC: 32 g/dL (ref 30.0–36.0)
MCV: 89.1 fL (ref 80.0–100.0)
Platelets: 234 10*3/uL (ref 150–400)
RBC: 5.15 MIL/uL (ref 4.22–5.81)
RDW: 14.1 % (ref 11.5–15.5)
WBC: 8.6 10*3/uL (ref 4.0–10.5)
nRBC: 0 % (ref 0.0–0.2)

## 2018-11-23 LAB — MRSA PCR SCREENING: MRSA by PCR: NEGATIVE

## 2018-11-23 LAB — BRAIN NATRIURETIC PEPTIDE: B Natriuretic Peptide: 93.8 pg/mL (ref 0.0–100.0)

## 2018-11-23 LAB — PROTIME-INR
INR: 1.09
Prothrombin Time: 14 seconds (ref 11.4–15.2)

## 2018-11-23 LAB — ECHOCARDIOGRAM COMPLETE
Height: 69 in
Weight: 4000 oz

## 2018-11-23 MED ORDER — FUROSEMIDE 10 MG/ML IJ SOLN
20.0000 mg | Freq: Two times a day (BID) | INTRAMUSCULAR | Status: DC
  Administered 2018-11-23 – 2018-11-27 (×8): 20 mg via INTRAVENOUS
  Filled 2018-11-23 (×8): qty 2

## 2018-11-23 MED ORDER — RIVAROXABAN 20 MG PO TABS
20.0000 mg | ORAL_TABLET | Freq: Every day | ORAL | Status: DC
  Administered 2018-11-23 – 2018-11-26 (×4): 20 mg via ORAL
  Filled 2018-11-23 (×4): qty 1

## 2018-11-23 MED ORDER — DILTIAZEM HCL-DEXTROSE 100-5 MG/100ML-% IV SOLN (PREMIX)
5.0000 mg/h | INTRAVENOUS | Status: DC
  Administered 2018-11-23: 12.5 mg/h via INTRAVENOUS
  Administered 2018-11-23: 5 mg/h via INTRAVENOUS
  Administered 2018-11-23 – 2018-11-24 (×2): 12.5 mg/h via INTRAVENOUS
  Filled 2018-11-23 (×4): qty 100

## 2018-11-23 MED ORDER — ACETAMINOPHEN 325 MG PO TABS
650.0000 mg | ORAL_TABLET | Freq: Four times a day (QID) | ORAL | Status: DC | PRN
  Administered 2018-11-23 – 2018-11-26 (×2): 650 mg via ORAL
  Filled 2018-11-23 (×2): qty 2

## 2018-11-23 MED ORDER — FUROSEMIDE 10 MG/ML IJ SOLN
20.0000 mg | Freq: Once | INTRAMUSCULAR | Status: AC
  Administered 2018-11-23: 20 mg via INTRAVENOUS
  Filled 2018-11-23: qty 2

## 2018-11-23 MED ORDER — ACETAMINOPHEN 650 MG RE SUPP: 650.0000 mg | Freq: Four times a day (QID) | RECTAL | Status: DC | PRN

## 2018-11-23 MED ORDER — ENOXAPARIN SODIUM 40 MG/0.4ML ~~LOC~~ SOLN
40.0000 mg | SUBCUTANEOUS | Status: DC
  Filled 2018-11-23: qty 0.4

## 2018-11-23 MED ORDER — DILTIAZEM LOAD VIA INFUSION
15.0000 mg | Freq: Once | INTRAVENOUS | Status: AC
  Administered 2018-11-23: 15 mg via INTRAVENOUS
  Filled 2018-11-23: qty 15

## 2018-11-23 NOTE — ED Notes (Signed)
Paged admitting per rn  

## 2018-11-23 NOTE — ED Notes (Signed)
Echo at bedside

## 2018-11-23 NOTE — ED Notes (Signed)
Pt requesting po fluids and food.

## 2018-11-23 NOTE — Progress Notes (Signed)
The heart rate is too high for a diagnostic echo at this time.
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
## 2018-11-23 NOTE — ED Notes (Signed)
Pt ate lunch tray 100% and tolerated well.

## 2018-11-23 NOTE — Progress Notes (Signed)
  Echocardiogram 2D Echocardiogram has been performed.  Anthony Byrd F 11/23/2018, 2:45 PM

## 2018-11-23 NOTE — Consult Note (Signed)
Cardiology Consultation:   Patient ID: Anthony SCHAKE MRN: [id number]; DOB: Dec 26, 1970  Admit date: 11/22/2018 Date of Consult: 11/23/2018  Primary Care Provider: Patient, No Pcp Per Primary Cardiologist: [place] Dresser)  Patient Profile:   Anthony Byrd is a 48 y.o. male with a hx of testicular cancer, gout who is being seen today for the evaluation of atrial fibrillation and edema at the request of Dr. Alvino Chapel.  History of Present Illness:   Anthony Byrd has had about 1-2 weeks of cough, followed by shortness of breath and lower extremity edema. He has also had fatigue, orthopnea, increased abdominal swelling and decreased appetite. No chest pain. No clear fevers/chills but multiple recent close sick contacts. No prior history of heart disease, mother had heart failure at age 56 just before she passed away.  On presentation he was noted to be in atrial fibrillation with RVR. On my interview he has been started on a diltiazem drip with improvements in his heart rates.   Past Medical History:  Diagnosis Date  . Gout   . Testicular cancer Anthony Byrd)     Past Surgical History:  Procedure Laterality Date  . EXTERNAL FIXATION LEG Right 06/24/2016   Procedure: EXTERNAL FIXATION LEG;  Surgeon: Renette Butters, MD;  Location: [place];  Service: Orthopedics;  Laterality: Right;  . EXTERNAL FIXATION REMOVAL Right 06/27/2016   Procedure: REMOVAL EXTERNAL FIXATION LEG;  Surgeon: Renette Butters, MD;  Location: [place];  Service: Orthopedics;  Laterality: Right;  . ORIF ANKLE FRACTURE Left 07/03/2016   Procedure: OPEN REDUCTION INTERNAL FIXATION (ORIF) ANKLE FRACTURE; DRESSING CHANGE RIGHT LEG;  Surgeon: Renette Butters, MD;  Location: [place];  Service: Orthopedics;  Laterality: Left;  . ORIF TIBIA PLATEAU Right 06/27/2016   Procedure: OPEN REDUCTION INTERNAL FIXATION (ORIF) TIBIAL PLATEAU;  Surgeon: Renette Butters, MD;  Location: [place];  Service: Orthopedics;  Laterality: Right;  . SURGERY  SCROTAL / TESTICULAR       Home Medications:  Prior to Admission medications   Medication Sig Start Date End Date Taking? Authorizing Provider  acetaminophen (TYLENOL) 325 MG tablet Take 2 tablets (650 mg total) by mouth every 6 (six) hours as needed for mild pain. 07/06/16  Yes Meuth, Brooke A, PA-C  methocarbamol (ROBAXIN) 500 MG tablet Take 1 tablet (500 mg total) by mouth 3 (three) times daily. Patient taking differently: Take 500 mg by mouth 3 (three) times daily as needed for muscle spasms.  07/06/16  Yes Meuth, Brooke A, PA-C  multivitamin (ONE-A-DAY MEN'S) TABS tablet Take 1 tablet by mouth daily.   Yes [provider]  oxyCODONE (OXY IR/ROXICODONE) 5 MG immediate release tablet Take 1-2 tablets (5-10mg ) every 4-6 hours as needed for pain. Wean off this medication. Patient taking differently: Take 5-10 mg by mouth every 6 (six) hours as needed for moderate pain.  07/06/16  Yes Meuth, Brooke A, PA-C  traMADol (ULTRAM) 50 MG tablet Take 1 tablet (50 mg total) by mouth every 6 (six) hours as needed. Patient taking differently: Take 50 mg by mouth every 6 (six) hours as needed for moderate pain.  08/14/17  Yes Janne Napoleon, NP  aspirin EC 325 MG tablet Take 1 tablet (325 mg total) by mouth daily. Patient not taking: Reported on 11/23/2018 07/06/16   Margie Billet A, PA-C  celecoxib (CELEBREX) 200 MG capsule Take 1 capsule (200 mg total) by mouth every 12 (twelve) hours. Patient not taking: Reported on 11/23/2018 07/06/16   Wellington Hampshire,  PA-C  docusate sodium (COLACE) 100 MG capsule Take 1 capsule (100 mg total) by mouth 2 (two) times daily. Patient not taking: Reported on 11/23/2018 06/24/16   Renette Butters, MD  docusate sodium (COLACE) 100 MG capsule Take 1 capsule (100 mg total) by mouth 2 (two) times daily. Patient not taking: Reported on 11/23/2018 07/06/16   Wellington Hampshire, PA-C  omeprazole (PRILOSEC) 20 MG capsule Take 1 capsule (20 mg total) by mouth daily. Patient not taking:  Reported on 11/23/2018 07/06/16   Margie Billet A, PA-C  ondansetron (ZOFRAN) 4 MG tablet Take 1 tablet (4 mg total) by mouth every 6 (six) hours as needed for nausea. Patient not taking: Reported on 11/23/2018 07/06/16   Wellington Hampshire, PA-C    Inpatient Medications: Scheduled Meds: . enoxaparin (LOVENOX) injection  40 mg Subcutaneous Q24H   Continuous Infusions: . diltiazem (CARDIZEM) infusion 12.5 mg/hr (11/23/18 1500)   PRN Meds: acetaminophen **OR** acetaminophen  Allergies:    Allergies  Allergen Reactions  . Peanut-Containing Drug Products Nausea And Vomiting    Pt reports tolerance to small amount of peanut now    Social History:   Social History   Socioeconomic History  . Marital status: Legally Separated    Spouse name: Not on file  . Number of children: Not on file  . Years of education: Not on file  . Highest education level: Not on file  Occupational History  . Not on file  Social Needs  . Financial resource strain: Not on file  . Food insecurity:    Worry: Not on file    Inability: Not on file  . Transportation needs:    Medical: Not on file    Non-medical: Not on file  Tobacco Use  . Smoking status: Never Smoker  . Smokeless tobacco: Never Used  Substance and Sexual Activity  . Alcohol use: No  . Drug use: No  . Sexual activity: Not on file  Lifestyle  . Physical activity:    Days per week: Not on file    Minutes per session: Not on file  . Stress: Not on file  Relationships  . Social connections:    Talks on phone: Not on file    Gets together: Not on file    Attends religious service: Not on file    Active member of club or organization: Not on file    Attends meetings of clubs or organizations: Not on file    Relationship status: Not on file  . Intimate partner violence:    Fear of current or ex partner: Not on file    Emotionally abused: Not on file    Physically abused: Not on file    Forced sexual activity: Not on file  Other Topics  Concern  . Not on file  Social History Narrative  . Not on file    Family History:   Mother with heart failure at age 40.   ROS:  Please see the history of present illness.  Review of Systems  Constitutional: Positive for malaise/fatigue. Negative for chills and fever.  HENT: Negative for congestion and sore throat.   Eyes: Negative for double vision and pain.  Respiratory: Positive for cough, sputum production and shortness of breath.   Cardiovascular: Positive for orthopnea and leg swelling. Negative for chest pain, palpitations and claudication.  Gastrointestinal: Positive for abdominal pain. Negative for blood in stool and melena.  Genitourinary: Negative for dysuria and hematuria.  Musculoskeletal: Negative for falls and  myalgias.  Neurological: Negative for focal weakness and loss of consciousness.  Endo/Heme/Allergies: Does not bruise/bleed easily.   All other ROS reviewed and negative.     Physical Exam/Data:   Vitals:   11/23/18 1400 11/23/18 1430 11/23/18 1445 11/23/18 1541  BP: (!) 116/105 (!) 101/51 114/76   Pulse: 87 (!) 51 (!) 151 (!) 105  Resp: 14 16 (!) 25   Temp:    98.5 F (36.9 C)  TempSrc:    Oral  SpO2: 96% 95% 97%   Weight:    128.9 kg  Height:    5\' 9"  (1.753 m)    Intake/Output Summary (Last 24 hours) at 11/23/2018 1650 Last data filed at 11/23/2018 1319 Gross per 24 hour  Intake 222 ml  Output 1350 ml  Net -1128 ml   Filed Weights   11/23/18 0824 11/23/18 1541  Weight: 113.4 kg 128.9 kg   Body mass index is 41.95 kg/m.  General:  Well nourished, well developed, in no acute distress HEENT: normal Lymph: no adenopathy Neck: JVD at low neck at 45 degrees Endocrine:  No thryomegaly Vascular: No carotid bruits; RA pulses 2+ bilaterally Cardiac:  Irregularly irregular S1, S2; distant heart sounds, 1/6 SM Lungs:  Coarse bilaterally  Abd: soft, nontender, appears distended Ext: bilateral 2+ LE edema Musculoskeletal:  No deformities, BUE and  BLE strength normal and equal Skin: warm and dry  Neuro:  no focal abnormalities noted Psych:  Normal affect   EKG:  The EKG was personally reviewed and demonstrates:  Atrial fibrillation,  Telemetry:  Telemetry was personally reviewed and demonstrates:  Atrial fibrilaltion  Relevant CV Studies: Echo today - Left ventricle: The cavity size was normal. Wall thickness was   increased in a pattern of mild LVH. Systolic function was normal.   The estimated ejection fraction was in the range of 60% to 65%.   Wall motion was normal; there were no regional wall motion   abnormalities. - Aortic valve: Valve mobility was restricted. There was very mild   stenosis. There was trivial regurgitation. Valve area (VTI): 2.11   cm^2. Valve area (Vmax): 1.77 cm^2. Valve area (Vmean): 1.86   cm^2. - Mitral valve: There was moderate to severe regurgitation. - Left atrium: The atrium was moderately dilated.  Impressions:  - Normal LV systolic function; mild LVH; calcified aortic valve   with very mild AS and trace AI; moderate to severe MR; moderate   LAE; mild TR.  Laboratory Data:  Chemistry Recent Labs  Lab 11/22/18 2350  NA 140  K 4.0  CL 107  CO2 24  GLUCOSE 104*  BUN 14  CREATININE 1.14  CALCIUM 8.6*  GFRNONAA >60  GFRAA >60  ANIONGAP 9    No results for input(s): PROT, ALBUMIN, AST, ALT, ALKPHOS, BILITOT in the last 168 hours. Hematology Recent Labs  Lab 11/22/18 2350  WBC 8.6  RBC 5.15  HGB 14.7  HCT 45.9  MCV 89.1  MCH 28.5  MCHC 32.0  RDW 14.1  PLT 234   Cardiac EnzymesNo results for input(s): TROPONINI in the last 168 hours.  Recent Labs  Lab 11/23/18 0005  TROPIPOC 0.02    BNP Recent Labs  Lab 11/23/18 0824  BNP 93.8    DDimer No results for input(s): DDIMER in the last 168 hours.  Radiology/Studies:  Dg Chest 2 View  Result Date: 11/23/2018 CLINICAL DATA:  Shortness of breath. Cough for 2 weeks, productive. Bilateral leg swelling. EXAM: CHEST -  2 VIEW COMPARISON:  None. FINDINGS: Borderline cardiomegaly. Diffuse peribronchial thickening. Possible tiny left pleural effusion. No confluent airspace disease. No pneumothorax. Degenerative change in the spine. IMPRESSION: Diffuse peribronchial thickening may represent bronchitis or pulmonary edema. Borderline cardiomegaly with possible trace left pleural effusion. Electronically Signed   By: Keith Rake M.D.   On: 11/23/2018 00:20    Assessment and Plan:   1. Atrial fibrillation with RVR: likely exacerbated by upper respiratory infection -echo with preserved EF, MR and dilated atrium likely predisposing to afib -on diltiazem drip for rate control. Once stable can consolidate to oral dilt. -CHA2DS2/VAS Stroke Risk Points=0, but would start anticoagulation with a DOAC in case cardioversion needed. Most likely would try to rate control while URI being treated, then if still in afib at outpatient follow up would arrange for cardioversion. He is in agreement with this. Starting rivaroxaban this evening -volume overloaded on exam. Would continue diuresis with BID lasix until volume improved  We will check in on him tomorrow.  For questions or updates, please contact [facility] Please consult [email or web address] for contact info under   Signed, Buford Dresser, MD  11/23/2018 4:50 PM

## 2018-11-23 NOTE — ED Notes (Signed)
Cardiology md at bedside. 

## 2018-11-23 NOTE — ED Notes (Signed)
Diet tray ordered per MD order.

## 2018-11-23 NOTE — H&P (Signed)
Date: 11/23/2018               Patient Name:  Anthony Byrd MRN: [id number]  DOB: 07-Feb-1971 Age / Sex: 48 y.o., male   PCP: Patient, No Pcp Per         Medical Service: Internal Medicine Teaching Service         Attending Physician: Dr. Bartholomew Crews, MD    First Contact: Dr. Laural Golden Pager: [phone]  Second Contact: Dr. Tarri Abernethy Pager: [phone]       After Hours (After 5p/  First Contact Pager: [phone]  weekends / holidays): Second Contact Pager: [phone]   Chief Complaint: LE swelling   History of Present Illness: Anthony Byrd is a 48 y.o male with a history of gout, testicular cancer and OSA who presented to the ED for progressive LE edema of 1 week duration. For the past 2 weeks the patient has been experiencing productive cough, then approximately 1 week ago the patient noted some LE edema that continued to progress and is now painful. This is what prompted him to seek further evaluation. He endorses orthopnea, SOB, increased abdominal girth/bloating, decreased appetite, N/V. Denies chest pain, palpitations, fevers, chills, sick contact, or abdominal pain.  He reported normal urine output and normal bowel movements. Denies any history of heart failure or cardiac disease.  ED course: On arrival patient was hypertensive, bradycardic, and tachypneic.  I-STAT troponin 0.02. EKG showed atrial fibrillation with RVR and he was started on IV diltiazem.  Chest x-ray showed diffuseperibronchial thickening may represent bronchitis or pulmonary edema. Borderline cardiomegaly with possible trace left pleural effusion.  Meds:  Previously on medications for gout and lasix but now not on anything as he has not seen a doctor in several years.   Allergies: Allergies as of 11/22/2018 - Review Complete 08/14/2017  Allergen Reaction Noted  . Peanut-containing drug products Nausea And Vomiting 06/23/2016   Past Medical History:  Diagnosis Date  . Gout   . Testicular cancer  [facility])     Family History:  Family history positive for DM. Denies family history of heart failure, kidney disease, HTN.   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   Physical Exam: Blood pressure 114/90, pulse (!) 112, temperature 99.1 F (37.3 C), temperature source Oral, resp. rate 17, height 5\' 9"  (1.753 m), weight 113.4 kg, SpO2 97 %.  Physical Exam  Constitutional: He is oriented to person, place, and time and well-developed, well-nourished, and in no distress.  Eyes: Conjunctivae are normal.  Neck: No JVD present.  Cardiovascular: An irregularly irregular rhythm present. Tachycardia present. PMI is not displaced.  Pulmonary/Chest: Effort normal and breath sounds normal. No respiratory distress. He has no wheezes. He has no rales.  Abdominal: Bowel sounds are normal. He exhibits distension. There is hepatomegaly. There is no abdominal tenderness.  abdominal swelling and edema  Musculoskeletal:        General: Edema present.     Comments: 2+ pitting edema in bilateral LE  Neurological: He is alert and oriented to person, place, and time.  Skin: Skin is dry.  Psychiatric: Mood, memory, affect and judgment normal.    EKG: personally reviewed my interpretation is atrial fibrillation with RVR  CXR: personally reviewed my interpretation is cardiomegaly  Assessment & Plan by Problem:  Active Problems:   Atrial fibrillation (HCC)  Anthony Byrd is a 48 y.o male with a history of gout, testicular cancer and OSA who presented to the ED for progressive LE edema of 1 week duration. His EKG showed atrial fibrillation with RVR and he was started on IV diltiazem.   Atrial Fibrillation EKG showed atrial fibrillation with RVR and patient was started on diltiazem in the ED.  Patient has no known history of atrial  fibrillation.  Stated he started noticing chest palpitations after he was informed of his atrial fibrillation but denies feeling this in the past.  Unclear etiology, no past echocardiograms per chart review.  Will obtain an echocardiogram stat to determine if this is structural versus nonstructural etiology. Risk factors include OSA, morbid obesity, upper respiratory infection and possible CHF. Will also consult cardiology to assess if he will be a candidate for cardioversion and pending echo results, whether he will require anti-arrhythmic vs rate control agents. - Consulted cardiology, appreciate recommendations - Cardiac monitoring - Echocardiogram stat - Continue diltiazem IV until echocardiogram results return and adjust as necessary  LE edema Patient presented with 2+ pitting edema in bilateral lower extremities as well as abdominal swelling and edema.  He reported he has been on Lasix in the past but currently not on any medications. Ordered a stat echocardiogram and will give him a one time dose of furosemide. - Furosemide 20 mg IV x1  OSA  Patient reported history of OSA and has a CPAP machine at home which he only uses when he is in town.  We will encourage CPAP use. - CPAP qhs  Gout Patient reported a history of gout no recent, no recent flareups.  He is currently not on any medications for his gout.  Will continue to monitor  Testicular cancer Patient reported that he was diagnosed with testicular cancer at the age of 49 and treated with chemotherapy.  He cannot recall the name of any of the medications that he took.  Diet: NPO DVT prophylaxis: Lovenox Full code  Dispo: Admit patient to Inpatient with expected length of stay greater than 2 midnights.  SignedMike Craze, DO 11/23/2018, 9:09 AM  Pager: [phone]

## 2018-11-24 DIAGNOSIS — I4819 Other persistent atrial fibrillation: Principal | ICD-10-CM

## 2018-11-24 DIAGNOSIS — I080 Rheumatic disorders of both mitral and aortic valves: Secondary | ICD-10-CM

## 2018-11-24 DIAGNOSIS — I340 Nonrheumatic mitral (valve) insufficiency: Secondary | ICD-10-CM

## 2018-11-24 DIAGNOSIS — I5031 Acute diastolic (congestive) heart failure: Secondary | ICD-10-CM

## 2018-11-24 LAB — BASIC METABOLIC PANEL
Anion gap: 8 (ref 5–15)
BUN: 20 mg/dL (ref 6–20)
CO2: 25 mmol/L (ref 22–32)
Calcium: 8.3 mg/dL — ABNORMAL LOW (ref 8.9–10.3)
Chloride: 103 mmol/L (ref 98–111)
Creatinine, Ser: 1.31 mg/dL — ABNORMAL HIGH (ref 0.61–1.24)
GFR calc Af Amer: >60 mL/min (ref >60)
Glucose, Bld: 109 mg/dL — ABNORMAL HIGH (ref 70–99)
POTASSIUM: 3.9 mmol/L (ref 3.5–5.1)
Sodium: 136 mmol/L (ref 135–145)

## 2018-11-24 LAB — CBC
HCT: 47.5 % (ref 39.0–52.0)
Hemoglobin: 14.8 g/dL (ref 13.0–17.0)
MCH: 27.9 pg (ref 26.0–34.0)
MCHC: 31.2 g/dL (ref 30.0–36.0)
MCV: 89.5 fL (ref 80.0–100.0)
Platelets: 213 10*3/uL (ref 150–400)
RBC: 5.31 MIL/uL (ref 4.22–5.81)
RDW: 14.3 % (ref 11.5–15.5)
WBC: 7.4 10*3/uL (ref 4.0–10.5)
nRBC: 0 % (ref 0.0–0.2)

## 2018-11-24 LAB — HIV ANTIBODY (ROUTINE TESTING W REFLEX): HIV SCREEN 4TH GENERATION: NONREACTIVE

## 2018-11-24 MED ORDER — DILTIAZEM HCL 60 MG PO TABS
90.0000 mg | ORAL_TABLET | Freq: Four times a day (QID) | ORAL | Status: DC
  Administered 2018-11-24 – 2018-11-27 (×13): 90 mg via ORAL
  Filled 2018-11-24 (×14): qty 2

## 2018-11-24 MED ORDER — SODIUM CHLORIDE 0.9 % IV SOLN
INTRAVENOUS | Status: DC
  Administered 2018-11-25: 06:00:00 via INTRAVENOUS

## 2018-11-24 NOTE — Discharge Instructions (Signed)

## 2018-11-24 NOTE — Progress Notes (Signed)
.      [facility] has been requested to perform a transesophageal echocardiogram and DCCV on Anthony Byrd for atrial fibrillation.  After careful review of history and examination, the risks and benefits of transesophageal echocardiogram have been explained including risks of esophageal damage, perforation (1:10,000 risk), bleeding, pharyngeal hematoma as well as other potential complications associated with conscious sedation including aspiration, arrhythmia, respiratory failure and death. Alternatives to treatment were discussed, questions were answered. Patient is willing to proceed.   Wannetta Sender will work on scheduling for tomorrow. I will place orders including NPO after midnight. I procedure is not scheduled until Tuesday he will need to be given a diet and then NPO after MN on Monday night.   Daune Perch, NP  11/24/2018 11:47 AM

## 2018-11-24 NOTE — Progress Notes (Signed)
Date: 11/24/2018  Patient name: Anthony Byrd  Medical record number: [id number]  Date of birth: Mar 22, 1971   I have seen and evaluated Anthony Byrd and discussed their care with the Residency Team.  Mr. Anthony Byrd is a 48 year old man with no cardiac history but untreated OSA who presents for 2 weeks of productive cough and 1 week of increasing lower extremity edema which is now painful.  He denies dyspnea but endorses dyspnea on exertion.  On admission, he was found to be in A. fib with RVR and was started on a diltiazem drip.  He was also started on IV diuresis for his edema.  He was able to use the hospital CPAP last night and stated that it was "stronger" than his CPAP machine and therefore did quite well and he was able to tolerate it for 4 hours.  This morning, he states that lying flat caused him to cough that he has noticed a decrease in his leg pain and edema.  His echo showed mild LVH and normal left ventricular systolic function.  There was moderate to severe mitral regurgitation and moderately dilated left atrium.  The right side and pulmonary arteries were normal.  PMHx, Fam Hx, and/or Soc Hx : He does not have traditional insurance but has a plan through a [employer] group that reimburse his son care  Vitals:   11/24/18 0737 11/24/18 1128  BP: 113/76 135/74  Pulse: 85 92  Resp: (!) 24 19  Temp: 97.9 F (36.6 C) 98.2 F (36.8 C)  SpO2: 96% 93%  Net - 1.6 L on admit Appears dyspneic with a respiratory rate as high as 30 General obese, thick neck H irr irr no murmur LCTAB with good airflow ABD obese + BS Ext trace edema bilaterally  Cr 1.14 - 1.31 BNP 94 TSH 0.907  I personally viewed the CXR images and confirmed my reading with the official read.  PA and lateral, cardiomegaly, blurring of the left heart border.  I personally viewed the EKG and confirmed my reading with the official read.  A. fib with RVR to the 140s.  Normal axis, nonspecific ST and T wave  changes.  Assessment and Plan: I have seen and evaluated the patient as outlined above. I agree with the formulated Assessment and Plan as detailed in the residents' note, with the following changes:Mr. Anthony Byrd is a 48 year old man with no cardiac history but untreated OSA who presents for 2 weeks of productive cough and 1 week of increasing lower extremity edema which is now painful.  He was found to be in A. fib with RVR and was started on a diltiazem drip and Xarelto.  His heart rate has now decreased to the 90s and he is being converted to oral diltiazem.  His echo showed a normal systolic function but moderate mitral regurgitation and cardiology is planning a TEE with cardioversion if permitted.  They feel A. fib is likely contributing to his symptoms in light of his MR so sinus rhythm will help him to feel better faster than just rate control.  He was also diuresed with IV Lasix and his creatinine has increased so we will need to watch this closely with further diuresis.  The underlying etiology for this episode is obesity and OSA which is untreated.  He last had a sleep study many many years ago and does not feel that his machine and mask are as "strong" as the hospitals.  We have offered outpatient follow-up in the [facility] to  help with scheduling a repeat sleep study and medication assistance.  1.  A. fib with RVR - transition to oral diltiazem for rate control with up titration as needed.  Anticoagulation with Xarelto but he will need medication assistance or samples at discharge.  Cardiology is planning TEE and cardioversion likely tomorrow.  2.  Acute diastolic heart failure - cont IV Lasix but follow volume status with I's and O's and weights and creatinine closely as his creatinine has bumped.  3.  Moderate to severe mitral regurgitation - outpt follow-up  4.  Untreated OSA - cont CPAP in the inpatient setting and we will arrange outpatient follow-up with [facility] for repeat sleep study Bartholomew Crews, MD 1/5/20202:16 PM

## 2018-11-24 NOTE — Progress Notes (Signed)
Progress Note  Patient Name: Anthony Byrd Date of Encounter: 11/24/2018  Primary Cardiologist: Buford Dresser, MD   Subjective   Feeling a little bit less short of breath but not at baseline.  Still in atrial fibrillation but under better rate control with IV diltiazem 12.5.  Good diuresis with 20 mg of IV Lasix yesterday.  Inpatient Medications    Scheduled Meds: . furosemide  20 mg Intravenous BID  . rivaroxaban  20 mg Oral Q supper   Continuous Infusions: . diltiazem (CARDIZEM) infusion 12.5 mg/hr (11/24/18 0804)   PRN Meds: acetaminophen **OR** acetaminophen   Vital Signs    Vitals:   11/24/18 0545 11/24/18 0600 11/24/18 0700 11/24/18 0737  BP: 120/86   113/76  Pulse: 97 91 87 85  Resp: 17 (!) 22 20 (!) 24  Temp: 98.6 F (37 C)   97.9 F (36.6 C)  TempSrc: Oral   Oral  SpO2: 93% 90% 94% 96%  Weight: 129 kg     Height:        Intake/Output Summary (Last 24 hours) at 11/24/2018 1043 Last data filed at 11/24/2018 0804 Gross per 24 hour  Intake 500.75 ml  Output 1500 ml  Net -999.25 ml   Filed Weights   11/23/18 0824 11/23/18 1541 11/24/18 0545  Weight: 113.4 kg 128.9 kg 129 kg    Telemetry    A. fib heart rates in the 90s currently down from 130s- Personally Reviewed  ECG    Atrial fibrillation with rapid ventricular response- Personally Reviewed  Physical Exam   GEN: No acute distress.  Obese Neck: No JVD Cardiac:  Irregularly irregular, no murmurs, rubs, or gallops.  Respiratory: Clear to auscultation bilaterally. GI: Soft, nontender, non-distended  MS:  3+ bilateral lower extremity pitting/tense edema; No deformity. Neuro:  Nonfocal  Psych: Normal affect   Labs    Chemistry Recent Labs  Lab 11/22/18 2350 11/24/18 0426  NA 140 136  K 4.0 3.9  CL 107 103  CO2 24 25  GLUCOSE 104* 109*  BUN 14 20  CREATININE 1.14 1.31*  CALCIUM 8.6* 8.3*  GFRNONAA >60 >60  GFRAA >60 >60  ANIONGAP 9 8     Hematology Recent Labs  Lab  11/22/18 2350 11/24/18 0426  WBC 8.6 7.4  RBC 5.15 5.31  HGB 14.7 14.8  HCT 45.9 47.5  MCV 89.1 89.5  MCH 28.5 27.9  MCHC 32.0 31.2  RDW 14.1 14.3  PLT 234 213    Cardiac EnzymesNo results for input(s): TROPONINI in the last 168 hours.  Recent Labs  Lab 11/23/18 0005  TROPIPOC 0.02     BNP Recent Labs  Lab 11/23/18 0824  BNP 93.8     DDimer No results for input(s): DDIMER in the last 168 hours.   Radiology    Dg Chest 2 View  Result Date: 11/23/2018 CLINICAL DATA:  Shortness of breath. Cough for 2 weeks, productive. Bilateral leg swelling. EXAM: CHEST - 2 VIEW COMPARISON:  None. FINDINGS: Borderline cardiomegaly. Diffuse peribronchial thickening. Possible tiny left pleural effusion. No confluent airspace disease. No pneumothorax. Degenerative change in the spine. IMPRESSION: Diffuse peribronchial thickening may represent bronchitis or pulmonary edema. Borderline cardiomegaly with possible trace left pleural effusion. Electronically Signed   By: Keith Rake M.D.   On: 11/23/2018 00:20    Cardiac Studies   ECHO 11/23/18 - Left ventricle: The cavity size was normal. Wall thickness was increased in a pattern of mild LVH. Systolic function was normal. The estimated  ejection fraction was in the range of 60% to 65%. Wall motion was normal; there were no regional wall motion abnormalities. - Aortic valve: Valve mobility was restricted. There was very mild stenosis. There was trivial regurgitation. Valve area (VTI): 2.11 cm^2. Valve area (Vmax): 1.77 cm^2. Valve area (Vmean): 1.86 cm^2. - Mitral valve: There was moderate to severe regurgitation. - Left atrium: The atrium was moderately dilated.  Impressions:  - Normal LV systolic function; mild LVH; calcified aortic valve with very mild AS and trace AI; moderate to severe MR; moderate LAE; mild TR.  Patient Profile     48 y.o. male with newly discovered atrial fibrillation, morbid obesity,  moderate to severe mitral regurgitation, dilated left atrium with acute diastolic heart failure  Assessment & Plan    Persistent atrial fibrillation with rapid ventricular response - Upper respiratory infection likely trigger - EF normal on echocardiogram with dilated left atrium - Diltiazem IV 12.5, I will change over to diltiazem 90 mg 4 times a day p.o. - Xarelto started in case cardioversion is necessary.  First dose was given 11/23/2018.  Tentatively, I will see if we can add him on to TEE cardioversion tomorrow.  Given his mitral regurgitation, shortness of breath, I think he would feel better in normal rhythm sooner rather than later.  Acute diastolic heart failure - Related to atrial fibrillation - Lasix twice daily -2.1 L out Creatinine 1.31 up from 1.1.  Continue with Lasix however.  Moderate to severe mitral regurgitation - Leading to moderately dilated left atrium.  May at some time need repair given his new onset atrial fibrillation as well.  Morbid obesity -Continue to encourage weight loss; likely contributing significantly to his underlying atrial fibrillation. -Consider sleep study as outpatient  Complex medical decision making.     For questions or updates, please contact [place] Please consult [email or web address] for contact info under        Signed, Candee Furbish, MD  11/24/2018, 10:43 AM

## 2018-11-24 NOTE — Progress Notes (Signed)
   Subjective: Anthony Byrd was seen laying in bed comfortably this morning.  No overnight events.  He reported that he feels his lower extremity swelling and abdominal swelling have improved.  He feels a little short of breath but denies any chest pain or palpitations.  He has been urinating well but noticed his urine is dark in color.  Denies dysuria.  Objective:  Vital signs in last 24 hours: Vitals:   11/23/18 2030 11/23/18 2033 11/23/18 2222 11/24/18 0545  BP:   94/63 120/86  Pulse: (!) 108 83 97 97  Resp: (!) 26 (!) 25 20 17   Temp:   98.4 F (36.9 C) 98.6 F (37 C)  TempSrc:   Oral Oral  SpO2: 98% 96% 95% 93%  Weight:    129 kg  Height:       Physical Exam  Constitutional: He is oriented to person, place, and time and well-developed, well-nourished, and in no distress.  Cardiovascular: Normal rate. An irregularly irregular rhythm present.  Pulmonary/Chest: Effort normal and breath sounds normal. No respiratory distress. He has no wheezes. He has no rales.  Abdominal: Soft. Bowel sounds are normal. He exhibits distension. There is no abdominal tenderness.  Musculoskeletal:        General: Edema present.  Neurological: He is alert and oriented to person, place, and time.  Skin: Skin is warm and dry.  Psychiatric: Mood, memory, affect and judgment normal.    Assessment/Plan:  Active Problems:   Atrial fibrillation (HCC)  Anthony Byrd is a 48 y.o male with a history of gout, testicular cancer and OSA who presented to the ED for progressive LE edema of 1 week duration. His EKG showed atrial fibrillation with RVR and he was started on IV diltiazem.   Atrial Fibrillation Patient still in atrial fibrillation, rate controlled rate controlled on IV diltiazem 12.5.  Echocardiogram showed left ventricular ejection fraction of 60%, left atrial enlargement, mild stenosis of the aortic valve and moderate to severe regurgitation of the mitral valve.  A. fib most likely secondary to  respiratory infection, but obesity and OSA.Marland Kitchen  He was started on Xarelto yesterday cardiology. He was switched to oral diltiazem 90 mg 4 times a day by cardiology today.  Cardiology will try to add him onto TEE cardioversion for tomorrow. - cardiology on board, appreciate recommendations - Cardiac monitoring - oral potassium 90 mg 4 times a day - Continue Xarelto  Acute diastolic heart failure Patient continues to have bilateral lower extremity edema, his abdominal swelling has improved.  He is on furosemide 20 mg IV twice daily.  Continuing to have good urinary output.  Creatinine increased from 1.1 to 1.31, will continue to monitor. - Furosemide 20 mg IV bid - A.m. BMP  Moderate to severe mitral regurgitation Leading to moderately dilated left atrium.  Per cardiology may need repair down the road given his new onset of atrial fibrillation as well.  OSA   Patient reported he felt better using the CPAP machine in the hospital than the one he uses at home. He will need his home CPAP machine to be adjusted.  Plan is to follow-up in the [facility] clinic after discharge. - CPAP qhs  Dispo: Anticipated discharge in approximately 1-2 day(s).   Mike Craze, DO 11/24/2018, 6:43 AM Pager: [phone]

## 2018-11-25 DIAGNOSIS — E662 Morbid (severe) obesity with alveolar hypoventilation: Secondary | ICD-10-CM

## 2018-11-25 DIAGNOSIS — G4733 Obstructive sleep apnea (adult) (pediatric): Secondary | ICD-10-CM

## 2018-11-25 DIAGNOSIS — I4891 Unspecified atrial fibrillation: Secondary | ICD-10-CM

## 2018-11-25 DIAGNOSIS — I340 Nonrheumatic mitral (valve) insufficiency: Secondary | ICD-10-CM

## 2018-11-25 LAB — CBC
HEMATOCRIT: 45.5 % (ref 39.0–52.0)
Hemoglobin: 14.7 g/dL (ref 13.0–17.0)
MCH: 28.5 pg (ref 26.0–34.0)
MCHC: 32.3 g/dL (ref 30.0–36.0)
MCV: 88.2 fL (ref 80.0–100.0)
Platelets: 249 10*3/uL (ref 150–400)
RBC: 5.16 MIL/uL (ref 4.22–5.81)
RDW: 14 % (ref 11.5–15.5)
WBC: 7.6 10*3/uL (ref 4.0–10.5)
nRBC: 0 % (ref 0.0–0.2)

## 2018-11-25 LAB — URINALYSIS, ROUTINE W REFLEX MICROSCOPIC
Bilirubin Urine: NEGATIVE
Glucose, UA: NEGATIVE mg/dL
Ketones, ur: NEGATIVE mg/dL
Nitrite: NEGATIVE
Protein, ur: NEGATIVE mg/dL
RBC / HPF: >50 RBC/hpf — ABNORMAL HIGH (ref 0–5)
Specific Gravity, Urine: 1.004 — ABNORMAL LOW (ref 1.005–1.030)
pH: 7 (ref 5.0–8.0)

## 2018-11-25 LAB — BASIC METABOLIC PANEL
Anion gap: 5 (ref 5–15)
BUN: 17 mg/dL (ref 6–20)
CO2: 25 mmol/L (ref 22–32)
Calcium: 8.4 mg/dL — ABNORMAL LOW (ref 8.9–10.3)
Chloride: 103 mmol/L (ref 98–111)
Creatinine, Ser: 1.19 mg/dL (ref 0.61–1.24)
GFR calc Af Amer: >60 mL/min (ref >60)
GFR calc non Af Amer: >60 mL/min (ref >60)
Glucose, Bld: 104 mg/dL — ABNORMAL HIGH (ref 70–99)
Potassium: 3.8 mmol/L (ref 3.5–5.1)
Sodium: 133 mmol/L — ABNORMAL LOW (ref 135–145)

## 2018-11-25 NOTE — Progress Notes (Signed)
   Subjective: Patient noticed that his urine was dark brown overnight. He used the CPAP last night for 6 hours. He denies palpitations or heart racing. He states the swelling and pain in his legs has improved. He does not have any questions or concerns this AM. [name] that we are awaiting TEE with possible DCCV but otherwise will be continuing his current medications. He voices understanding.   Objective: Vital signs in last 24 hours: Vitals:   11/24/18 1954 11/24/18 2216 11/24/18 2327 11/25/18 0558  BP: 98/65 123/74  102/66  Pulse: 79  94 94  Resp: 18  19 19   Temp: 97.8 F (36.6 C)   97.7 F (36.5 C)  TempSrc: Oral   Axillary  SpO2: 97%  96% 98%  Weight:      Height:       General: Obese male in no acute distress CV: Irregularly irregular rhythm I could not appreciate a murmur Extremities: Mild to moderate pitting edema (improving)  Assessment/Plan:  Principal Problem:   Atrial fibrillation (HCC)  Anthony Byrd is a 48 y.o male with a history of gout, testicular cancerand OSA who presented to the ED for progressive LE edema of 1 week duration. His EKG showed atrial fibrillation with RVR and he was started on IV diltiazem.   Atrial Fibrillation Patient scheduled for TEE with cardioversion on 11/26/2018. Transitioned to oral diltiazem yesterday and remained rate controlled.  - cardiology on board, appreciate recommendations - Cardiac monitoring reviewed. Rate controlled A-fib - Oral diltiazem 90 mg 4 times a day - Continue Xarelto  Acute diastolic heart failure Patient continues to have bilateral lower extremity edema, his abdominal swelling has improved.  He is on furosemide 20 mg IV twice daily.  Continuing to have good urinary output, ~1.8L.  Creatinine 1.19, will continue to monitor. - Furosemide 20 mg IV bid - A.m. BMP  Moderate to severe mitral regurgitation - Will need outpatient follow up  OSA - Will need outpatient follow up to adjust home CPAP  machine - CPAP qhs  Dispo: Anticipated discharge in approximately 1-2 day(s).   Anthony Homes, MD 11/25/2018, 9:53 AM Pager: [phone]

## 2018-11-25 NOTE — Progress Notes (Signed)
Pt had dark-what looked to be bloody urine. Paged Internal Med on call to notify

## 2018-11-25 NOTE — Progress Notes (Signed)
Pt still having dark urine- paged to possibly  add a Cbc on to morning labs. Pt is asymptomatic- with urination.

## 2018-11-25 NOTE — Care Management Note (Signed)
Case Management Note  Patient Details  Name: Anthony Byrd MRN: [id number] Date of Birth: 1971/11/07  Subjective/Objective:  Pt presented for Atrial Fib- PTA from home. Patient does not have a PCP at this time. CM did discuss with patient local clinics in [place] and patient wants to utilize the Internal Medicine Clinic. When patient gets established in the Internal Medicine Clinic- he can utilize the [facility] for medications.                   Action/Plan: Patient is a good candidate for Transitions of Care Pharmacy for new medications. CM will continue to monitor for additional transition of care needs.   Expected Discharge Date:                  Expected Discharge Plan:  Home/Self Care  In-House Referral:  NA  Discharge planning Services  CM Consult, [facility], Medication Assistance  Post Acute Care Choice:  NA Choice offered to:  NA  DME Arranged:  N/A DME Agency:  NA  HH Arranged:  NA HH Agency:  NA  Status of Service:  Completed, signed off  If discussed at [place] of Stay Meetings, dates discussed:    Additional Comments:  Anthony Roys, RN 11/25/2018, 12:58 PM

## 2018-11-25 NOTE — Progress Notes (Signed)
RT added sterile water to CPAP chamber.  Patient places self on CPAP machine.

## 2018-11-25 NOTE — Progress Notes (Addendum)
Progress Note  Patient Name: Anthony Byrd Date of Encounter: 11/25/2018  Primary Cardiologist: Buford Dresser, MD   Subjective   No significant overnight events. Patient feeling well. Shortness of breath has improved. He reports one episodes of dizziness at rest yesterday that lasted for about 20 seconds. No chest pain.  Inpatient Medications    Scheduled Meds: . diltiazem  90 mg Oral QID  . furosemide  20 mg Intravenous BID  . rivaroxaban  20 mg Oral Q supper   Continuous Infusions: . sodium chloride 20 mL/hr at 11/25/18 0615   PRN Meds: acetaminophen **OR** acetaminophen   Vital Signs    Vitals:   11/24/18 1954 11/24/18 2216 11/24/18 2327 11/25/18 0558  BP: 98/65 123/74  102/66  Pulse: 79  94 94  Resp: 18  19 19   Temp: 97.8 F (36.6 C)   97.7 F (36.5 C)  TempSrc: Oral   Axillary  SpO2: 97%  96% 98%  Weight:      Height:        Intake/Output Summary (Last 24 hours) at 11/25/2018 0721 Last data filed at 11/25/2018 0615 Gross per 24 hour  Intake 961.57 ml  Output 1825 ml  Net -863.43 ml   Filed Weights   11/23/18 0824 11/23/18 1541 11/24/18 0545  Weight: 113.4 kg 128.9 kg 129 kg    Telemetry    Atrial fibrillation with ventricular rates ranging from 70's to 110's. - Personally Reviewed  ECG    No new ECG tracings. - Personally Reviewed  Physical Exam   GEN: Obese Caucasian male resting comfortably. Alert and in no acute distress.   Neck: Supple. No JVD appreciated. Cardiac: Irregularly irregular rhythm with regular rate. No significant murmurs, rubs, or gallops.  Respiratory: Clear to auscultation bilaterally. No wheezes, rhonchi, or rales. GI: Abdomen soft, obese, and non-tender to palpation. Bowel sounds present. MS: 2+ pitting edema of bilateral lower extremities extending up to knees. No deformities. Neuro:  No focal deficits. Psych: Normal affect.  Labs    Chemistry Recent Labs  Lab 11/22/18 2350 11/24/18 0426  NA 140 136  K  4.0 3.9  CL 107 103  CO2 24 25  GLUCOSE 104* 109*  BUN 14 20  CREATININE 1.14 1.31*  CALCIUM 8.6* 8.3*  GFRNONAA >60 >60  GFRAA >60 >60  ANIONGAP 9 8     Hematology Recent Labs  Lab 11/22/18 2350 11/24/18 0426  WBC 8.6 7.4  RBC 5.15 5.31  HGB 14.7 14.8  HCT 45.9 47.5  MCV 89.1 89.5  MCH 28.5 27.9  MCHC 32.0 31.2  RDW 14.1 14.3  PLT 234 213    Cardiac EnzymesNo results for input(s): TROPONINI in the last 168 hours.  Recent Labs  Lab 11/23/18 0005  TROPIPOC 0.02     BNP Recent Labs  Lab 11/23/18 0824  BNP 93.8     DDimer No results for input(s): DDIMER in the last 168 hours.   Radiology    No results found.  Cardiac Studies   Echocardiogram 11/23/2018: Study Conclusions: - Left ventricle: The cavity size was normal. Wall thickness was   increased in a pattern of mild LVH. Systolic function was normal.   The estimated ejection fraction was in the range of 60% to 65%.   Wall motion was normal; there were no regional wall motion   abnormalities. - Aortic valve: Valve mobility was restricted. There was very mild   stenosis. There was trivial regurgitation. Valve area (VTI): 2.11   cm^2.  Valve area (Vmax): 1.77 cm^2. Valve area (Vmean): 1.86   cm^2. - Mitral valve: There was moderate to severe regurgitation. - Left atrium: The atrium was moderately dilated.  Impressions: - Normal LV systolic function; mild LVH; calcified aortic valve   with very mild AS and trace AI; moderate to severe MR; moderate   LAE; mild TR.  Patient Profile   Anthony Byrd is a 48 y.o. male with a history of morbid obesity, testicular cancer, and gout who presented to the [facility] ED on 11/23/2017 for evaluation of cough and lower extremity swelling and was found to have new onset atrial fibrillation and acute diastolic CHF.  Assessment & Plan    New Onset Atrial Fibrillation with RVR - Likely triggered by upper respiratory infection. - Echo this admission showed LVEF of 60-65%,  moderately dilated left atrium, and moderate to severe mitral regurgitation. - Telemetry shows atrial fibrillations with ventricular rates ranging from the 70's to 110's. Currently, in the 80's to 90's.  - Patient was transitioned from IV Diltiazem to PO 90mg  every 6 hours yesterday. Consider increasing dose if unable to be cardioverted today.  - Patient started on Xarelto on 1/4 in anticipation for possible cardioversion. Per RN note, patient having dark urine this morning. Will check CBC. - Trying to get patient on schedule for TEE/DCCV today. Patient is NPO and orders have been placed.   Acute Diastolic CHF - Likely related to new onset atrial fibrillation. - Echo as above. - Diuresing well. Documented output of 1.8 L in the past 24 hours with net negative 2.5 L since admission. Serum creatinine stable. - Patient continues to have lower extremity edema but lungs are clear.  - Continue diuresis with IV Lasix 20mg  twice daily.  - Continue to monitor daily weights, strict I/O's, and renal function.  Moderate to Severe Mitral Regurgitation - Leading to moderately dilated left atrium. May need repair at some point given his new onset atrial fibrillation.   For questions or updates, please contact [place] Please consult [email or web address] for contact info under        Signed, Darreld Mclean, PA-C  11/25/2018, 7:21 AM

## 2018-11-26 ENCOUNTER — Encounter (HOSPITAL_COMMUNITY): Payer: Self-pay | Admitting: *Deleted

## 2018-11-26 ENCOUNTER — Ambulatory Visit (HOSPITAL_COMMUNITY): Payer: Self-pay

## 2018-11-26 ENCOUNTER — Inpatient Hospital Stay (HOSPITAL_COMMUNITY): Payer: Self-pay | Admitting: Anesthesiology

## 2018-11-26 ENCOUNTER — Encounter (HOSPITAL_COMMUNITY)
Admission: EM | Disposition: A | Discharge status: Home / Self Care | Payer: Self-pay | Source: Home / Self Care | Attending: Oncology

## 2018-11-26 DIAGNOSIS — R319 Hematuria, unspecified: Secondary | ICD-10-CM

## 2018-11-26 DIAGNOSIS — I340 Nonrheumatic mitral (valve) insufficiency: Secondary | ICD-10-CM

## 2018-11-26 DIAGNOSIS — I4891 Unspecified atrial fibrillation: Secondary | ICD-10-CM

## 2018-11-26 HISTORY — PX: TEE WITHOUT CARDIOVERSION: SHX5443

## 2018-11-26 HISTORY — PX: CARDIOVERSION: SHX1299

## 2018-11-26 LAB — BASIC METABOLIC PANEL
Anion gap: 8 (ref 5–15)
BUN: 25 mg/dL — AB (ref 6–20)
CO2: 27 mmol/L (ref 22–32)
CREATININE: 1.1 mg/dL (ref 0.61–1.24)
Calcium: 8.7 mg/dL — ABNORMAL LOW (ref 8.9–10.3)
Chloride: 101 mmol/L (ref 98–111)
GFR calc Af Amer: >60 mL/min (ref >60)
GFR calc non Af Amer: >60 mL/min (ref >60)
Glucose, Bld: 102 mg/dL — ABNORMAL HIGH (ref 70–99)
Potassium: 3.8 mmol/L (ref 3.5–5.1)
Sodium: 136 mmol/L (ref 135–145)

## 2018-11-26 SURGERY — ECHOCARDIOGRAM, TRANSESOPHAGEAL
Anesthesia: Monitor Anesthesia Care

## 2018-11-26 MED ORDER — PROPOFOL 10 MG/ML IV BOLUS
INTRAVENOUS | Status: DC | PRN
  Administered 2018-11-26 (×5): 20 mg via INTRAVENOUS

## 2018-11-26 MED ORDER — LACTATED RINGERS IV SOLN
INTRAVENOUS | Status: DC | PRN
  Administered 2018-11-26: 12:00:00 via INTRAVENOUS

## 2018-11-26 MED ORDER — PROPOFOL 500 MG/50ML IV EMUL
INTRAVENOUS | Status: DC | PRN
  Administered 2018-11-26: 75 ug/kg/min via INTRAVENOUS

## 2018-11-26 MED ORDER — MENTHOL 3 MG MT LOZG
1.0000 | LOZENGE | OROMUCOSAL | Status: DC | PRN
  Administered 2018-11-26: 3 mg via ORAL
  Filled 2018-11-26: qty 9

## 2018-11-26 MED ORDER — HYDROCORTISONE 1 % EX CREA
TOPICAL_CREAM | Freq: Four times a day (QID) | CUTANEOUS | Status: DC | PRN
  Filled 2018-11-26: qty 28

## 2018-11-26 NOTE — Anesthesia Procedure Notes (Signed)
Procedure Name: MAC Date/Time: 11/26/2018 12:04 PM Performed by: Candis Shine, CRNA Pre-anesthesia Checklist: Patient identified, Emergency Drugs available, Suction available, Patient being monitored and Timeout performed Patient Re-evaluated:Patient Re-evaluated prior to induction Oxygen Delivery Method: Nasal cannula Dental Injury: Teeth and Oropharynx as per pre-operative assessment

## 2018-11-26 NOTE — Progress Notes (Signed)
Patient places self on CPAP.  RT assistance not needed at this time. 

## 2018-11-26 NOTE — Anesthesia Preprocedure Evaluation (Signed)
Anesthesia Evaluation  Patient identified by MRN, date of birth, ID band Patient awake    Reviewed: Allergy & Precautions, NPO status , Patient's Chart, lab work & pertinent test results  History of Anesthesia Complications Negative for: history of anesthetic complications  Airway Mallampati: III  TM Distance: >3 FB Neck ROM: Full    Dental  (+) Teeth Intact, Dental Advisory Given   Pulmonary neg shortness of breath, sleep apnea (has lost 70 pounds since diagnosis and no longer feels this is an issue) , neg COPD, neg recent URI,    Pulmonary exam normal breath sounds clear to auscultation       Cardiovascular Exercise Tolerance: Good (-) angina(-) Past MI, (-) Cardiac Stents and (-) CABG + dysrhythmias Atrial Fibrillation  Rhythm:Regular Rate:Normal     Neuro/Psych neg Seizures negative neurological ROS     GI/Hepatic negative GI ROS, Neg liver ROS, neg GERD  ,  Endo/Other  Morbid obesity  Renal/GU negative Renal ROS     Musculoskeletal   Abdominal (+) + obese,   Peds  Hematology negative hematology ROS (+)   Anesthesia Other Findings Gout, h/o testicular cancer  Reproductive/Obstetrics                             Anesthesia Physical  Anesthesia Plan  ASA: III  Anesthesia Plan: MAC   Post-op Pain Management:    Induction: Intravenous  PONV Risk Score and Plan: 1 and Treatment may vary due to age or medical condition  Airway Management Planned: Nasal Cannula  Additional Equipment:   Intra-op Plan:   Post-operative Plan:   Informed Consent: I have reviewed the patients History and Physical, chart, labs and discussed the procedure including the risks, benefits and alternatives for the proposed anesthesia with the patient or authorized representative who has indicated his/her understanding and acceptance.   Dental advisory given  Plan Discussed with:   Anesthesia Plan  Comments:         Anesthesia Quick Evaluation

## 2018-11-26 NOTE — Progress Notes (Addendum)
Progress Note  Patient Name: Anthony Byrd Date of Encounter: 11/26/2018  Primary Cardiologist: Buford Dresser, MD   Subjective   No significant overnight events. Patient feeling well. No chest pain, shortness of breath, palpitations, or dizziness. He feels like his lower extremity swelling is improving. RN expressed concern that patient continues to have hematuria.   Patient is scheduled for TEE/DCCV later today.  Inpatient Medications    Scheduled Meds: . diltiazem  90 mg Oral QID  . furosemide  20 mg Intravenous BID  . rivaroxaban  20 mg Oral Q supper   Continuous Infusions: . sodium chloride Stopped (11/25/18 1050)   PRN Meds: acetaminophen **OR** acetaminophen   Vital Signs    Vitals:   11/25/18 2123 11/26/18 0024 11/26/18 0429 11/26/18 0738  BP: 127/89 124/71 130/76   Pulse:  69 86   Resp:      Temp:  98 F (36.7 C) 97.9 F (36.6 C) 98.4 F (36.9 C)  TempSrc:  Oral Oral Oral  SpO2:  100% 97%   Weight:   128 kg   Height:        Intake/Output Summary (Last 24 hours) at 11/26/2018 0941 Last data filed at 11/26/2018 0935 Gross per 24 hour  Intake 1200 ml  Output 2175 ml  Net -975 ml   Filed Weights   11/23/18 1541 11/24/18 0545 11/26/18 0429  Weight: 128.9 kg 129 kg 128 kg    Telemetry    Atrial fibrillation with ventricular rates ranging from 70's to 100's. Multiple pauses noted with longest one being about 2.7 seconds. - Personally Reviewed  ECG    No new ECG tracings. - Personally Reviewed  Physical Exam   GEN: Obese Caucasian male resting comfortably. Alert and in no acute distress.   Neck: Supple. No JVD appreciated. Cardiac: Irregularly irregular rhythm and borderline tachycardic. No significant murmurs, rubs, or gallops.  Respiratory: Clear to auscultation bilaterally. No wheezes, rhonchi, or rales. GI: Abdomen soft, obese, and non-tender to palpation. Bowel sounds present. MS: 1-2 + pitting edema of bilateral lower extremities,  improving. No deformities. Neuro:  No focal deficits. Psych: Normal affect.  Labs    Chemistry Recent Labs  Lab 11/24/18 0426 11/25/18 0716 11/26/18 0417  NA 136 133* 136  K 3.9 3.8 3.8  CL 103 103 101  CO2 25 25 27   GLUCOSE 109* 104* 102*  BUN 20 17 25*  CREATININE 1.31* 1.19 1.10  CALCIUM 8.3* 8.4* 8.7*  GFRNONAA >60 >60 >60  GFRAA >60 >60 >60  ANIONGAP 8 5 8      Hematology Recent Labs  Lab 11/22/18 2350 11/24/18 0426 11/25/18 0716  WBC 8.6 7.4 7.6  RBC 5.15 5.31 5.16  HGB 14.7 14.8 14.7  HCT 45.9 47.5 45.5  MCV 89.1 89.5 88.2  MCH 28.5 27.9 28.5  MCHC 32.0 31.2 32.3  RDW 14.1 14.3 14.0  PLT 234 213 249    Cardiac EnzymesNo results for input(s): TROPONINI in the last 168 hours.  Recent Labs  Lab 11/23/18 0005  TROPIPOC 0.02     BNP Recent Labs  Lab 11/23/18 0824  BNP 93.8     DDimer No results for input(s): DDIMER in the last 168 hours.   Radiology    No results found.  Cardiac Studies   Echocardiogram 11/23/2018: Study Conclusions: - Left ventricle: The cavity size was normal. Wall thickness was   increased in a pattern of mild LVH. Systolic function was normal.   The estimated ejection fraction was  in the range of 60% to 65%.   Wall motion was normal; there were no regional wall motion   abnormalities. - Aortic valve: Valve mobility was restricted. There was very mild   stenosis. There was trivial regurgitation. Valve area (VTI): 2.11   cm^2. Valve area (Vmax): 1.77 cm^2. Valve area (Vmean): 1.86   cm^2. - Mitral valve: There was moderate to severe regurgitation. - Left atrium: The atrium was moderately dilated.  Impressions: - Normal LV systolic function; mild LVH; calcified aortic valve   with very mild AS and trace AI; moderate to severe MR; moderate   LAE; mild TR.  Patient Profile   Anthony Byrd is a 48 y.o. male with a history of morbid obesity, testicular cancer, and gout who presented to [facility] ED on 11/23/2017 for  evaluation of cough and lower extremity swelling and was found to have new onset atrial fibrillation and acute diastolic CHF.  Assessment & Plan    New Onset Atrial Fibrillation with RVR - Likely triggered by upper respiratory infection. - Echo this admission showed LVEF of 60-65%, moderately dilated left atrium, and moderate to severe mitral regurgitation. - Telemetry shows atrial fibrillations with ventricular rates ranging from the 70's to 110's. Currently, in the 80's to 90's.  - Patient was transitioned from IV Diltiazem to PO 90mg  every 6 hours on 1/5. Will continue at this time. - Patient started on Xarelto on 1/4 in anticipation of cardioversion. CHA2DS2-VASc = 0. But will need anticoagulation for at least 4 weeks following cardioversion. - Patient scheduled for TEE/DCCV later today.   Acute Diastolic CHF - Likely related to new onset atrial fibrillation. - Echo as above. - Diuresing well on IV Lasix 20mg  twice daily. Documented output of 2.5 L in the past 24 hours with net negative 3.8 L since admission. Serum creatinine stable. - Lower extremity edema is improving. - Continue IV Lasix today. Consider transitioning to PO Lasix 40mg  tomorrow.  - Continue to monitor daily weights, strict I/O's, and renal function.  Moderate to Severe Mitral Regurgitation - Leading to moderately dilated left atrium. May need repair at some point given his new onset atrial fibrillation.  Hematuria - RN has noted intermittent hematuria and dark urine. - Urinalysis yesterday showed >50 RBC. - Hemoglobin 14.7 yesterday. - Patient denies any other abnormal bleeding.  - Will need to continue to monitor while patient is on Xarelto. - May need outpatient work-up for this.  - Will defer to primary team.    For questions or updates, please contact [place] Please consult [email or web address] for contact info under        Signed, Darreld Mclean, PA-C  11/26/2018, 9:41 AM     ---------------------------------------------------------------------------------------------   History and all data above reviewed.  Patient examined.  I agree with the findings as above.  Anthony Byrd is anticipating TEE/DCCV today.  Constitutional: No acute distress Cardiovascular: irregular rhythm, normal rate, no murmurs. S1 and S2 normal. Radial pulses normal bilaterally. No jugular venous distention.  Respiratory: clear to auscultation bilaterally GI : normal bowel sounds, soft and nontender. No distention.   MSK: extremities warm, well perfused. No edema.  NEURO: grossly nonfocal exam, moves all extremities. PSYCH: alert and oriented x 3, normal mood and affect.   All available labs, radiology testing, previous records reviewed. Agree with documented assessment and plan of my colleague as stated above with the following additions or changes:  Principal Problem:   Atrial fibrillation ([place]) Active Problems:  OSA (obstructive sleep apnea)   Mitral regurgitation    Plan: plan for cardioversion today with tee. He continues to diurese well.  Elouise Munroe, MD [facility]

## 2018-11-26 NOTE — CV Procedure (Signed)
    Transesophageal Echocardiogram Note  MOSTYN VARNELL [id number] [birth year]  Procedure: Transesophageal Echocardiogram Indications: Atrial fib   Procedure Details Consent: Obtained Time Out: Verified patient identification, verified procedure, site/side was marked, verified correct patient position, special equipment/implants available, Radiology Safety Procedures followed,  medications/allergies/relevent history reviewed, required imaging and test results available.  Performed  Medications:  During this procedure the patient is administered a Propofol drip by CRNA, Hilaria Ota.    A total of 325 mg Propofol was used for the TEE and cardioversino .  Left Ventrical:  Normal LV function   Mitral Valve: mild - mod MR   Aortic Valve: normal   Tricuspid Valve: mild - mod TR   Pulmonic Valve: trace PI  Left Atrium/ Left atrial appendage: no thrombi    Atrial septum:  No obvious ASD or PFO.  Bubble contrast was not given   Aorta: normal    Complications: No apparent complications Patient did tolerate procedure well.     Cardioversion Note  DAETON KLUTH [id number] [birth year]  Procedure: DC Cardioversion Indications: atrial fib   Procedure Details Consent: Obtained Time Out: Verified patient identification, verified procedure, site/side was marked, verified correct patient position, special equipment/implants available, Radiology Safety Procedures followed,  medications/allergies/relevent history reviewed, required imaging and test results available.  Performed  The patient has been on adequate anticoagulation.  The patient received IV Propofol ( see above )  for sedation.  Synchronous cardioversion was performed at 200, 200, 200  joules.  The cardioversion was unsuccessful .     Complications: No apparent complications Patient did tolerate procedure well.   Thayer Headings, Brooke Bonito., MD, [facility] 11/26/2018, 12:36 PM

## 2018-11-26 NOTE — Progress Notes (Signed)
Spoke with Dr. Laural Golden concerning blood in urine at times. Bright red this am. Then cleared on 2nd void for me. Stated they are aware and will continue to monitor. Carroll Kinds RN

## 2018-11-26 NOTE — Anesthesia Postprocedure Evaluation (Signed)
Anesthesia Post Note  Patient: Anthony Byrd  Procedure(s) Performed: TRANSESOPHAGEAL ECHOCARDIOGRAM (TEE) (N/A ) CARDIOVERSION (N/A )     Patient location during evaluation: PACU Anesthesia Type: MAC Level of consciousness: awake and alert Pain management: pain level controlled Vital Signs Assessment: post-procedure vital signs reviewed and stable Respiratory status: spontaneous breathing, nonlabored ventilation and respiratory function stable Cardiovascular status: stable and blood pressure returned to baseline Postop Assessment: no apparent nausea or vomiting Anesthetic complications: no    Last Vitals:  Vitals:   11/26/18 1114 11/26/18 1237  BP: 122/88 120/73  Pulse: 98 96  Resp: 19 18  Temp: 36.5 C 36.4 C  SpO2: 96% 97%    Last Pain:  Vitals:   11/26/18 1237  TempSrc: Oral  PainSc: 0-No pain                 Lynda Rainwater

## 2018-11-26 NOTE — Transfer of Care (Signed)
Immediate Anesthesia Transfer of Care Note  Patient: Anthony Byrd  Procedure(s) Performed: TRANSESOPHAGEAL ECHOCARDIOGRAM (TEE) (N/A ) CARDIOVERSION (N/A )  Patient Location: Endoscopy Unit  Anesthesia Type:MAC  Level of Consciousness: awake, alert  and oriented  Airway & Oxygen Therapy: Patient Spontanous Breathing and Patient connected to nasal cannula oxygen  Post-op Assessment: Report given to RN and Post -op Vital signs reviewed and stable  Post vital signs: Reviewed and stable  Last Vitals:  Vitals Value Taken Time  BP 120/73 11/26/2018 12:34 PM  Temp    Pulse 85 11/26/2018 12:35 PM  Resp 35 11/26/2018 12:35 PM  SpO2 94 % 11/26/2018 12:35 PM  Vitals shown include unvalidated device data.  Last Pain:  Vitals:   11/26/18 1114  TempSrc: Oral  PainSc: 0-No pain      Patients Stated Pain Goal: 0 (59/97/74 1423)  Complications: No apparent anesthesia complications

## 2018-11-26 NOTE — Progress Notes (Signed)
   Subjective: Patient sleeping comfortably on bipap this morning. He reports that the swelling in his legs continues to improve. He only has pain in his legs when he moves them. He denies palpitations or heart racing. He continues to have dark urine.   Objective:  Vital signs in last 24 hours: Vitals:   11/25/18 1926 11/25/18 2123 11/26/18 0024 11/26/18 0429  BP: 113/72 127/89 124/71 130/76  Pulse: 85  69 86  Resp:      Temp: (!) 97.1 F (36.2 C)  98 F (36.7 C) 97.9 F (36.6 C)  TempSrc: Oral  Oral Oral  SpO2: 99%  100% 97%  Weight:    128 kg  Height:       Physical Exam  Constitutional: He is oriented to person, place, and time and well-developed, well-nourished, and in no distress.  Cardiovascular: Normal rate. An irregularly irregular rhythm present.  Pulmonary/Chest: Effort normal and breath sounds normal. No respiratory distress. He has no wheezes. He has no rales.  Abdominal: Soft. He exhibits distension. There is no abdominal tenderness.  Musculoskeletal:        General: Edema present.     Comments: 2+ pitting edema bilateral LE  Neurological: He is alert and oriented to person, place, and time.  Skin: Skin is warm and dry.  Psychiatric: Mood, memory, affect and judgment normal.    Assessment/Plan:  Principal Problem:   Atrial fibrillation with rapid ventricular response (HCC) Active Problems:   OSA (obstructive sleep apnea)   Mitral regurgitation  Anthony Byrd is a 48 y.o male with a history of gout, testicular cancerand OSA who presented to the ED for progressive LE edema of 1 week duration. His EKG showed atrial fibrillation with RVR and he was started on IV diltiazem.   Atrial Fibrillation Patient scheduled for TEE with cardioversion today. Has remained rate controlled on oral diltiazem. He will need anticoagulation for at least 4 weeks following cardioversion, per cardiology. -cardiology on board, appreciate recommendations -Oraldiltiazem 90 mg 4 times  a day - Continue Xarelto  Acute diastolic heart failure Patient continues to have bilateral lower extremity edema, his abdominal swelling has improved. He is on furosemide 20 mg IV twice daily. Continuing to have good urinary output, 2.5L. Creatinine 1.1. -Furosemide 20 mg IV bid - AM cmp  Hematuria Patient reported dark brown urine on 1/5. UA showed >50 RBC. Hemoglobin 14.7. He reports continued dark urine and noticed some blood in his urine today. Denies any prostate issues in the past, trauma or dysuria. Will continue to monitor while patient is on Xarelto. - am cbc  Moderate to severe mitral regurgitation - Will need outpatient follow up  OSA - CPAP qhs  Dispo: Anticipated discharge in approximately 1-2 day(s).   Mike Craze, DO 11/26/2018, 6:45 AM Pager: [phone]

## 2018-11-27 ENCOUNTER — Other Ambulatory Visit: Payer: Self-pay

## 2018-11-27 ENCOUNTER — Encounter (HOSPITAL_COMMUNITY): Payer: Self-pay | Admitting: General Practice

## 2018-11-27 ENCOUNTER — Telehealth: Payer: Self-pay

## 2018-11-27 DIAGNOSIS — Z7901 Long term (current) use of anticoagulants: Secondary | ICD-10-CM

## 2018-11-27 DIAGNOSIS — Z79899 Other long term (current) drug therapy: Secondary | ICD-10-CM

## 2018-11-27 DIAGNOSIS — Z6841 Body Mass Index (BMI) 40.0 and over, adult: Secondary | ICD-10-CM

## 2018-11-27 LAB — CBC
HCT: 45.8 % (ref 39.0–52.0)
Hemoglobin: 14.9 g/dL (ref 13.0–17.0)
MCH: 28.7 pg (ref 26.0–34.0)
MCHC: 32.5 g/dL (ref 30.0–36.0)
MCV: 88.2 fL (ref 80.0–100.0)
Platelets: 248 10*3/uL (ref 150–400)
RBC: 5.19 MIL/uL (ref 4.22–5.81)
RDW: 13.9 % (ref 11.5–15.5)
WBC: 6.6 10*3/uL (ref 4.0–10.5)
nRBC: 0 % (ref 0.0–0.2)

## 2018-11-27 LAB — BASIC METABOLIC PANEL
Anion gap: 8 (ref 5–15)
BUN: 14 mg/dL (ref 6–20)
CHLORIDE: 102 mmol/L (ref 98–111)
CO2: 27 mmol/L (ref 22–32)
Calcium: 8.6 mg/dL — ABNORMAL LOW (ref 8.9–10.3)
Creatinine, Ser: 1.19 mg/dL (ref 0.61–1.24)
GFR calc Af Amer: >60 mL/min (ref >60)
GFR calc non Af Amer: >60 mL/min (ref >60)
Glucose, Bld: 93 mg/dL (ref 70–99)
Potassium: 3.9 mmol/L (ref 3.5–5.1)
SODIUM: 137 mmol/L (ref 135–145)

## 2018-11-27 MED ORDER — FUROSEMIDE 40 MG PO TABS: 40.0000 mg | ORAL_TABLET | Freq: Two times a day (BID) | ORAL | 0 refills | Status: DC

## 2018-11-27 MED ORDER — DILTIAZEM HCL ER COATED BEADS 180 MG PO CP24: 360.0000 mg | ORAL_CAPSULE | Freq: Every day | ORAL | Status: DC

## 2018-11-27 MED ORDER — FUROSEMIDE 40 MG PO TABS: 40.0000 mg | ORAL_TABLET | Freq: Two times a day (BID) | ORAL | Status: DC

## 2018-11-27 MED ORDER — DILTIAZEM HCL ER COATED BEADS 240 MG PO CP24
240.0000 mg | ORAL_CAPSULE | Freq: Once | ORAL | Status: AC
  Administered 2018-11-27: 240 mg via ORAL
  Filled 2018-11-27: qty 1

## 2018-11-27 MED ORDER — DILTIAZEM HCL ER COATED BEADS 360 MG PO CP24: 360.0000 mg | ORAL_CAPSULE | Freq: Every day | ORAL | 0 refills | Status: DC

## 2018-11-27 MED ORDER — RIVAROXABAN 20 MG PO TABS: 20.0000 mg | ORAL_TABLET | Freq: Every day | ORAL | 0 refills | Status: DC

## 2018-11-27 MED FILL — XARELTO 20 MG TABLET: 20 | 30 days supply | Qty: 30 | Fill #0

## 2018-11-27 MED FILL — FUROSEMIDE 40 MG TABLET: 40 | 30 days supply | Qty: 60 | Fill #0

## 2018-11-27 MED FILL — CARTIA XT 180 MG CAPSULE SA: 180 | 30 days supply | Qty: 60 | Fill #0

## 2018-11-27 NOTE — Progress Notes (Signed)
Pt HR dropped to 29 but went back up to 70's- asymptomatic- will monitor

## 2018-11-27 NOTE — Progress Notes (Addendum)
Progress Note  Patient Name: Anthony Byrd Date of Encounter: 11/27/2018  Primary Cardiologist: Buford Dresser, MD   Subjective   Post TEE [place] 11/26/18 though unsuccessful.  No chest pain.   Pt was awake when his HR down to 35 and escape beats -brief episode.   Inpatient Medications    Scheduled Meds: . diltiazem  90 mg Oral QID  . furosemide  20 mg Intravenous BID  . rivaroxaban  20 mg Oral Q supper   Continuous Infusions: . sodium chloride Stopped (11/25/18 1050)   PRN Meds: acetaminophen **OR** acetaminophen, hydrocortisone cream, menthol-cetylpyridinium   Vital Signs    Vitals:   11/27/18 0007 11/27/18 0452 11/27/18 0805 11/27/18 0814  BP: 112/71 118/78 117/72   Pulse: (!) 57 86  80  Resp:      Temp: 97.7 F (36.5 C) 97.6 F (36.4 C)  98.4 F (36.9 C)  TempSrc: Axillary Oral  Oral  SpO2: 99% 100%    Weight:  125.7 kg    Height:        Intake/Output Summary (Last 24 hours) at 11/27/2018 0922 Last data filed at 11/27/2018 0800 Gross per 24 hour  Intake 1040 ml  Output 2350 ml  Net -1310 ml   Filed Weights   11/24/18 0545 11/26/18 0429 11/27/18 0452  Weight: 129 kg 128 kg 125.7 kg    Telemetry    A fib with HR from 128 to 35, mostly 100 unless activity - Personally Reviewed  ECG    No new - Personally Reviewed  Physical Exam   GEN: No acute distress.   Neck: No JVD Cardiac: RRR, no murmurs, rubs, or gallops.  Respiratory: few rales to auscultation bilaterally. GI: Soft, nontender, non-distended  MS: 1-2+ edema of lower ext and some in abd.; No deformity. Neuro:  Nonfocal  Psych: Normal affect   Labs    Chemistry Recent Labs  Lab 11/25/18 0716 11/26/18 0417 11/27/18 0517  NA 133* 136 137  K 3.8 3.8 3.9  CL 103 101 102  CO2 25 27 27   GLUCOSE 104* 102* 93  BUN 17 25* 14  CREATININE 1.19 1.10 1.19  CALCIUM 8.4* 8.7* 8.6*  GFRNONAA >60 >60 >60  GFRAA >60 >60 >60  ANIONGAP 5 8 8      Hematology Recent Labs  Lab  11/24/18 0426 11/25/18 0716 11/27/18 0517  WBC 7.4 7.6 6.6  RBC 5.31 5.16 5.19  HGB 14.8 14.7 14.9  HCT 47.5 45.5 45.8  MCV 89.5 88.2 88.2  MCH 27.9 28.5 28.7  MCHC 31.2 32.3 32.5  RDW 14.3 14.0 13.9  PLT 213 249 248    Cardiac EnzymesNo results for input(s): TROPONINI in the last 168 hours.  Recent Labs  Lab 11/23/18 0005  TROPIPOC 0.02     BNP Recent Labs  Lab 11/23/18 0824  BNP 93.8     DDimer No results for input(s): DDIMER in the last 168 hours.   Radiology    No results found.  Cardiac Studies   Echocardiogram 11/23/2018: Study Conclusions: - Left ventricle: The cavity size was normal. Wall thickness was increased in a pattern of mild LVH. Systolic function was normal. The estimated ejection fraction was in the range of 60% to 65%. Wall motion was normal; there were no regional wall motion abnormalities. - Aortic valve: Valve mobility was restricted. There was very mild stenosis. There was trivial regurgitation. Valve area (VTI): 2.11 cm^2. Valve area (Vmax): 1.77 cm^2. Valve area (Vmean): 1.86 cm^2. - Mitral valve:  There was moderate to severe regurgitation. - Left atrium: The atrium was moderately dilated.  Impressions: - Normal LV systolic function; mild LVH; calcified aortic valve with very mild AS and trace AI; moderate to severe MR; moderate LAE; mild TR.  TEE 11/26/18 Procedure: Transesophageal Echocardiogram Indications: Atrial fib   Procedure Details Consent: Obtained Time Out: Verified patient identification, verified procedure, site/side was marked, verified correct patient position, special equipment/implants available, Radiology Safety Procedures followed,  medications/allergies/relevent history reviewed, required imaging and test results available.  Performed  Medications:  During this procedure the patient is administered a Propofol drip by CRNA, Hilaria Ota.    A total of 325 mg Propofol was used for the TEE and  cardioversino .  Left Ventrical:  Normal LV function   Mitral Valve: mild - mod MR   Aortic Valve: normal   Tricuspid Valve: mild - mod TR   Pulmonic Valve: trace PI  Left Atrium/ Left atrial appendage: no thrombi    Atrial septum:  No obvious ASD or PFO.  Bubble contrast was not given   Aorta: normal    Complications: No apparent complications Patient did tolerate procedure well.  DCCV 11/26/18  Procedure Details Consent: Obtained Time Out: Verified patient identification, verified procedure, site/side was marked, verified correct patient position, special equipment/implants available, Radiology Safety Procedures followed,  medications/allergies/relevent history reviewed, required imaging and test results available.  Performed  The patient has been on adequate anticoagulation.  The patient received IV Propofol ( see above )  for sedation.  Synchronous cardioversion was performed at 200, 200, 200  joules.  The cardioversion was unsuccessful .    Patient Profile     48 y.o. male with a history of morbid obesity, testicular cancer, and gout who presented to the [facility] ED on 11/23/2017 for evaluation of cough and lower extremity swelling and was found to have new onset atrial fibrillation and acute diastolic CHF.  Assessment & Plan    Atrial fib, failed DCCV   --HR during the night slow at times but the slowest was this AM after 6 A and pt stated he was awake.  --now on dilt 90 mg 4 times per day.  --now on xarelto and CHA2DS2VASc of 0 but with pan to cardiovert on anticoagulation and will need 4 weeks post    Acute diastolic CHF  --secondary to a fib, with normal EF --on lasix 20 mg IV BID may benefit from another day of IV and change to po tomorrow.  -- negative 5599 ml since admit and wt down from 128.9 Kg (283.5 lbs) to 125.7 kg (276.5lbs )  Moderate to severe MR,  though on TEE mild to moderate.   Hematuria with hx of testicular cancer will need  outpt eval.   --hgb stable  --primary team following  OSA - his at home is not working well , not followed by anyone here in [place],       For questions or updates, please contact [place] Please consult [email or web address] for contact info under        Signed, Cecilie Kicks, NP  11/27/2018, 9:22 AM   ---------------------------------------------------------------------------------------------   History and all data above reviewed.  Patient examined.  I agree with the findings as above.  [name] did not cardiovert successfully yesterday. He has diuresed well.  Constitutional: No acute distress Eyes: pupils equally round and reactive to light, sclera non-icteric, normal conjunctiva and lids ENMT: normal dentition, moist mucous membranes Cardiovascular: irregular rhythm, normal rate, no  murmurs. S1 and S2 normal. Radial pulses normal bilaterally. No jugular venous distention.  Respiratory: clear to auscultation bilaterally GI : normal bowel sounds, soft and nontender. No distention.   MSK: extremities warm, well perfused. Trace edema.  NEURO: grossly nonfocal exam, moves all extremities. PSYCH: alert and oriented x 3, normal mood and affect.   All available labs, radiology testing, previous records reviewed. Agree with documented assessment and plan of my colleague as stated above with the following additions or changes:  Principal Problem:   Atrial fibrillation with rapid ventricular response (HCC) Active Problems:   OSA (obstructive sleep apnea)   Mitral regurgitation    Plan:  Afib - dismiss on diltiazem 360 mg daily, rivaroxaban 20 mg daily, and furosemide 40 mg BID, which can be decreased at close follow up. Plan to set up in Afib clinic in 1 week after dismissal. Discussed with EP, they will plan to evaluate need for antiarrhythmic agents as an outpatient.   Acute diastolic HF - resolving, likely precipitated by atrial fibrillation. May need furosemide as an  outpatient, or perhaps on an as needed basis.   OSA - treatment will likely help greatly with afib.   Bradycardia - occasional junctional escape beats on telemetry. They occur in isolation or two at a time, and are not prolonged. Patient states it is when he is sleeping without CPAP and he is otherwise asymptomatic. Will continue diltiazem and current long acting dose. Can be adjusted at follow up, I have counseled the patient on syncope or presyncope.   [facility] will sign off.   Medication Recommendations: as above. Other recommendations (labs, testing, etc):  - Follow up as an outpatient:  1 week with afib clinic, arranged.   Length of Stay:  LOS: 4 days   Elouise Munroe, MD [facility] 11:52 AM  11/27/2018

## 2018-11-27 NOTE — Progress Notes (Signed)
DC instructions given to patient. Questions answered, educated on medication regimen, restrictions, follow up appointments. Transitions pharmacy to bring medications to bedside prior to DC. All belongings sent home with patient. VSS. PIV DC, hemostasis achieved. Pt escorted via wheelchair by volunteers to private vehicle driven by friend.

## 2018-11-27 NOTE — Discharge Summary (Signed)
Name: Anthony Byrd MRN: [id number] DOB: 03-01-1971 48 y.o. PCP: Patient, No Pcp Per  Date of Admission: 11/22/2018 10:58 PM Date of Discharge: 11/27/2018 Attending Physician: Annia Belt, MD  Discharge Diagnosis: 1. New onset atrial fibrillation with RVR 2. Severe Mitral Regurgitation  3. Hematuria   Discharge Medications: Allergies as of 11/27/2018      Reactions   Peanut-containing Drug Products Nausea And Vomiting   Pt reports tolerance to small amount of peanut now      Medication List    STOP taking these medications   celecoxib 200 MG capsule Commonly known as:  CELEBREX   docusate sodium 100 MG capsule Commonly known as:  COLACE     TAKE these medications   acetaminophen 325 MG tablet Commonly known as:  TYLENOL Take 2 tablets (650 mg total) by mouth every 6 (six) hours as needed for mild pain.   aspirin EC 325 MG tablet Take 1 tablet (325 mg total) by mouth daily.   diltiazem 360 MG 24 hr capsule Commonly known as:  CARDIZEM CD Take 1 capsule (360 mg total) by mouth daily. Start taking on:  November 28, 2018   furosemide 40 MG tablet Commonly known as:  LASIX Take 1 tablet (40 mg total) by mouth 2 (two) times daily. Start taking on:  November 28, 2018   methocarbamol 500 MG tablet Commonly known as:  ROBAXIN Take 1 tablet (500 mg total) by mouth 3 (three) times daily. What changed:    when to take this  reasons to take this   multivitamin Tabs tablet Take 1 tablet by mouth daily.   omeprazole 20 MG capsule Commonly known as:  PRILOSEC Take 1 capsule (20 mg total) by mouth daily.   ondansetron 4 MG tablet Commonly known as:  ZOFRAN Take 1 tablet (4 mg total) by mouth every 6 (six) hours as needed for nausea.   oxyCODONE 5 MG immediate release tablet Commonly known as:  Oxy IR/ROXICODONE Take 1-2 tablets (5-10mg ) every 4-6 hours as needed for pain. Wean off this medication. What changed:    how much to take  how to take this  when  to take this  reasons to take this  additional instructions   rivaroxaban 20 MG Tabs tablet Commonly known as:  XARELTO Take 1 tablet (20 mg total) by mouth daily with supper.   traMADol 50 MG tablet Commonly known as:  ULTRAM Take 1 tablet (50 mg total) by mouth every 6 (six) hours as needed. What changed:  reasons to take this       Disposition and follow-up:   Mr.Iman E Dorin was discharged from [facility] in Stable condition.  At the hospital follow up visit please address:  1. Atrial fibrillation. Discuss financial needs with the patient. He is uninsured and will need assistance getting anticoagulation. Ensure he is taking the diltiazem. Mitral regurgitation. Ensure the patient is following up with cardiology for further evaluation. Hematuria please refer the patient to urology for cystoscopy given that he has history of testicular cancer.  2.  Labs / imaging needed at time of follow-up: CBC  3.  Pending labs/ test needing follow-up: None  Follow-up Appointments: Follow-up Information    Buford Dresser, MD Follow up.   Specialty:  Cardiology Contact information: [address] [place] [address] [place] Follow up on 12/04/2018.   Why:  @9 :45 am Contact information:  [address] [place], NP Follow up.   Specialties:  Nurse Practitioner, Cardiology Why:  this is the a fib clinic - call the week of appt for parking code.  they should call you by tomorrow for appt, if they have not please call them  Contact information: [place] [address] [phone]           Hospital Course by problem list:  1. New onset atrial fibrillation with RVR. [name] is a 48 y.o male with a history of gout, testicular cancerand OSA who presented to the ED for progressive LE edema of 1 week duration. His  EKG showed atrial fibrillation with RVR and he was started on IV diltiazem. Echocardiogram was pursued that showed multiple valvular/structural abnormalities including mitral regurgitation, tricuspid regurgitation, and an enlarged left atrium. Cardioversion was attempted; however, the patient did not convert to normal sinus rhythm with three attempts. He remained rate controlled on PO diltiazem for the remainder of his hospitalization. He was started on Xarelto for anticoagulation. He will follow-up with internal medicine residency clinic and cardiology as an outpatient.  2. Severe Mitral Regurgitation. Patient was found to have severe mitral regurgitation on transthoracic echo on 11/23/18. Transesophageal echocardiogram on 11/26/18 illustrated mild regurgitation. This is worth further evaluation is the patient may benefit from mitral repair.   3. Hematuria. Patient reported dark urine throughout his hospitalization. Urine analysis showed hematuria. The patient does have a history of testicular cancer. He needs a referral to urology for cystoscopy. This will be arranged by the internal medicine residency clinic that is outpatient follow-up.  Discharge Vitals:   BP 117/72   Pulse 80   Temp 98.4 F (36.9 C) (Oral)   Resp 20   Ht 5\' 9"  (1.753 m)   Wt 125.7 kg   SpO2 100%   BMI 40.94 kg/m   Pertinent Labs, Studies, and Procedures:   TTE 11/23/18  Study Conclusions - Left ventricle: The cavity size was normal. Wall thickness was   increased in a pattern of mild LVH. Systolic function was normal.   The estimated ejection fraction was in the range of 60% to 65%.   Wall motion was normal; there were no regional wall motion   abnormalities. - Aortic valve: Valve mobility was restricted. There was very mild   stenosis. There was trivial regurgitation. Valve area (VTI): 2.11   cm^2. Valve area (Vmax): 1.77 cm^2. Valve area (Vmean): 1.86   cm^2. - Mitral valve: There was moderate to severe  regurgitation. - Left atrium: The atrium was moderately dilated.  Impressions: - Normal LV systolic function; mild LVH; calcified aortic valve   with very mild AS and trace AI; moderate to severe MR; moderate   LAE; mild TR.  TEE 11/26/18  Study Conclusions - Left ventricle: The cavity size was normal. The estimated   ejection fraction was in the range of 55% to 60%. No evidence of   thrombus. - Mitral valve: There was mild regurgitation. - Left atrium: No evidence of thrombus in the atrial cavity or   appendage. - Tricuspid valve: There was moderate regurgitation.  Impressions: - Cardioversion was performed after the TEE     We shocked him 3 times at 200 J.   The cardioversion was not successful . No cardiac source of   emboli was indentified.  Discharge Instructions: Discharge Instructions    Diet - low sodium heart  healthy   Complete by:  As directed    Discharge instructions   Complete by:  As directed    Mr. Whitmire,  You were hospitalized due to new onset of atrial fibrillation. Please continue to take the following medications:  -Start Diltiazem (Cardizem) 360 mg once a day -Start Rivaroxaban (Xarelto) 20 mg once a day -Start Furosemide (Lasix) 40 mg twice a day  I have scheduled a hospital follow up appointment at the [place] clinic for you on 1/15 at 945 am. The atrial fibrillation clinic should call you tomorrow for an appointment. If you don't hear from them please call them at [phone] to schedule an appt.  Thank you for allowing [place] to be a part of your care!   Increase activity slowly   Complete by:  As directed       Signed: Rehman, Areeg N, DO 11/27/2018, 1:26 PM

## 2018-11-27 NOTE — Telephone Encounter (Signed)
Hospital TOC per DR helberg, discharge 11/27/18, appt 12/04/18.

## 2018-11-27 NOTE — Progress Notes (Addendum)
   Subjective: Anthony Byrd reported feeling well this morning. He feels that his LE edema is continuing to improve but is still having some intermittent pain in his legs. He reported his urine was dark yellow today with no blood.   Objective:  Vital signs in last 24 hours: Vitals:   11/26/18 1926 11/26/18 2213 11/27/18 0007 11/27/18 0452  BP: 130/90 111/88 112/71 118/78  Pulse: 89  (!) 57 86  Resp:      Temp: 98.9 F (37.2 C)  97.7 F (36.5 C) 97.6 F (36.4 C)  TempSrc: Oral  Axillary Oral  SpO2: 96%  99% 100%  Weight:    125.7 kg  Height:       Physical Exam  Constitutional: He is oriented to person, place, and time and well-developed, well-nourished, and in no distress.  Cardiovascular: Normal rate. An irregularly irregular rhythm present.  Pulmonary/Chest: Breath sounds normal. No respiratory distress.  Abdominal: Soft. There is no abdominal tenderness.  Musculoskeletal:        General: Edema present.  Neurological: He is alert and oriented to person, place, and time.  Skin: Skin is warm and dry.  Psychiatric: Memory, affect and judgment normal.    Assessment/Plan:  Principal Problem:   Atrial fibrillation with rapid ventricular response (HCC) Active Problems:   OSA (obstructive sleep apnea)   Mitral regurgitation  Anthony Byrd is a 48 y.o male with a history of gout, testicular cancerand OSA who presented to the ED for progressive LE edema of 1 week duration. His EKG showed atrial fibrillation with RVR and he was started on IV diltiazem.   Atrial Fibrillation TEE showed normal LV function, mild to moderate mitral regurgitation.  No thrombi in the left atrium or left atrial appendage.  No obvious ASD or PFO.  Bubble contrast not given. The patient did not achieve cardioversion to sinus rhythm. Tele continues to show atrial fibrillation. HR dropped to 29 overnight, went back up to 70's. Patient remained asymptomatic. Per cardiology, may need to change to antiarrythmic.   Will need to continue medical management.  -cardiology on board, appreciate recommendations -Oraldiltiazem90 mg 4 times a day - Continue Xarelto, will need to continue for 4 weeks  Acute diastolic heart failure Patient continues to have bilateral lower extremity edema, his abdominal swelling has improved. He is on furosemide 20 mg IV twice daily. Continuing to have good urinary output, 2.1 L. Cr stable. -Furosemide 20 mg IV bid, will likely transition to oral furosemide at discharge  Hematuria CBC stable. Given history of testicular cancer he will need outpatient follow up with urology  OSA - CPAP qhs  Dispo: Anticipated discharge in approximately 1 day.  Mike Craze, DO 11/27/2018, 6:41 AM Pager: [phone]

## 2018-12-02 NOTE — Telephone Encounter (Signed)
Transition Care Management Follow-up Telephone Call   Date discharged?11/27/18   How have you been since you were released from the hospital? "Doing good".   Do you understand why you were in the hospital? Yes - "Afib".   Do you understand the discharge instructions? yes   Where were you discharged to? Home.   Items Reviewed:  Medications reviewed: yes  Allergies reviewed: yes  Dietary changes reviewed: yes - stated placed on low salt diet.  Referrals reviewed: no   Functional Questionnaire:   Activities of Daily Living (ADLs):   He states they are independent in the following: Stated independent of all ADL's; he rents an apartment from people he knows so "basically" lives alone.   Any transportation issues/concerns?: no   Any patient concerns? no   Confirmed importance and date/time of follow-up visits scheduled : Stated he had called this morning and changed his appt to tomorrow @ 1515 PM in [facility].   Confirmed with patient if condition begins to worsen call PCP or go to the ER.  Patient was given the office number and encouraged to call back with question or concerns.  :Yes.

## 2018-12-03 ENCOUNTER — Other Ambulatory Visit: Payer: Self-pay

## 2018-12-03 ENCOUNTER — Encounter: Payer: Self-pay | Admitting: Internal Medicine

## 2018-12-03 ENCOUNTER — Ambulatory Visit (INDEPENDENT_AMBULATORY_CARE_PROVIDER_SITE_OTHER): Payer: Self-pay | Admitting: Internal Medicine

## 2018-12-03 ENCOUNTER — Encounter (HOSPITAL_COMMUNITY): Payer: Self-pay | Admitting: Nurse Practitioner

## 2018-12-03 ENCOUNTER — Ambulatory Visit (HOSPITAL_COMMUNITY)
Admission: RE | Admit: 2018-12-03 | Discharge: 2018-12-03 | Disposition: A | Discharge status: Home / Self Care | Payer: Self-pay | Source: Ambulatory Visit | Attending: Nurse Practitioner | Admitting: Nurse Practitioner

## 2018-12-03 VITALS — BP 136/88 | HR 103 | Ht 69.0 in | Wt 276.0 lb

## 2018-12-03 DIAGNOSIS — R319 Hematuria, unspecified: Secondary | ICD-10-CM

## 2018-12-03 DIAGNOSIS — E669 Obesity, unspecified: Secondary | ICD-10-CM | POA: Insufficient documentation

## 2018-12-03 DIAGNOSIS — R011 Cardiac murmur, unspecified: Secondary | ICD-10-CM

## 2018-12-03 DIAGNOSIS — I4819 Other persistent atrial fibrillation: Secondary | ICD-10-CM

## 2018-12-03 DIAGNOSIS — Z8547 Personal history of malignant neoplasm of testis: Secondary | ICD-10-CM | POA: Insufficient documentation

## 2018-12-03 DIAGNOSIS — Z79899 Other long term (current) drug therapy: Secondary | ICD-10-CM | POA: Insufficient documentation

## 2018-12-03 DIAGNOSIS — I4891 Unspecified atrial fibrillation: Secondary | ICD-10-CM | POA: Insufficient documentation

## 2018-12-03 DIAGNOSIS — M109 Gout, unspecified: Secondary | ICD-10-CM | POA: Insufficient documentation

## 2018-12-03 DIAGNOSIS — Z7901 Long term (current) use of anticoagulants: Secondary | ICD-10-CM

## 2018-12-03 DIAGNOSIS — I340 Nonrheumatic mitral (valve) insufficiency: Secondary | ICD-10-CM

## 2018-12-03 DIAGNOSIS — G4733 Obstructive sleep apnea (adult) (pediatric): Secondary | ICD-10-CM | POA: Insufficient documentation

## 2018-12-03 DIAGNOSIS — Z6841 Body Mass Index (BMI) 40.0 and over, adult: Secondary | ICD-10-CM | POA: Insufficient documentation

## 2018-12-03 DIAGNOSIS — Z91010 Allergy to peanuts: Secondary | ICD-10-CM

## 2018-12-03 LAB — BASIC METABOLIC PANEL
Anion gap: 10 (ref 5–15)
BUN: 15 mg/dL (ref 6–20)
CALCIUM: 9.2 mg/dL (ref 8.9–10.3)
CO2: 26 mmol/L (ref 22–32)
Chloride: 103 mmol/L (ref 98–111)
Creatinine, Ser: 0.98 mg/dL (ref 0.61–1.24)
GFR calc Af Amer: >60 mL/min (ref >60)
GFR calc non Af Amer: >60 mL/min (ref >60)
Glucose, Bld: 95 mg/dL (ref 70–99)
Potassium: 4.1 mmol/L (ref 3.5–5.1)
Sodium: 139 mmol/L (ref 135–145)

## 2018-12-03 LAB — CBC
HCT: 53.6 % — ABNORMAL HIGH (ref 39.0–52.0)
Hemoglobin: 16.9 g/dL (ref 13.0–17.0)
MCH: 27.3 pg (ref 26.0–34.0)
MCHC: 31.5 g/dL (ref 30.0–36.0)
MCV: 86.7 fL (ref 80.0–100.0)
Platelets: 288 10*3/uL (ref 150–400)
RBC: 6.18 MIL/uL — ABNORMAL HIGH (ref 4.22–5.81)
RDW: 13.4 % (ref 11.5–15.5)
WBC: 6.7 10*3/uL (ref 4.0–10.5)
nRBC: 0 % (ref 0.0–0.2)

## 2018-12-03 MED ORDER — METOPROLOL TARTRATE 25 MG PO TABS: 12.5000 mg | ORAL_TABLET | Freq: Two times a day (BID) | ORAL | 3 refills | Status: DC

## 2018-12-03 NOTE — Patient Instructions (Addendum)
Metoprolol 1/2 tablet twice a day  Weigh yourself every day in the morning  -- notify if you have more than a 5lbs weight gain from one day to the next.  Avoid salt  Use CPAP

## 2018-12-03 NOTE — Progress Notes (Signed)
CC: hospital discharge follow-up and to establish care  HPI:[name] is a 48 y.o. male who presents today to establish care hospital discharge follow-up and to establish care. Please see individual problem based A/P for details.  PHQ-9: Based on the patients    Office Visit from 12/03/2018 in [place]  PHQ-9 Total Score  0     score of zero we will monitor.  Past Medical History:  Diagnosis Date  . Atrial fibrillation ([place]) 11/2018   NEW  . Gout   . Sleep apnea    USES CPAP  . Testicular cancer ([place])    Review of Systems:   ROS negative except as per HPI.  Family History: DMII, CHF, CKD, HTN  Social History: Social History   Socioeconomic History  . Marital status: Legally Separated    Spouse name: Not on file  . Number of children: Not on file  . Years of education: Not on file  . Highest education level: Not on file  Occupational History  . Not on file  Social Needs  . Financial resource strain: Not on file  . Food insecurity:    Worry: Not on file    Inability: Not on file  . Transportation needs:    Medical: Not on file    Non-medical: Not on file  Tobacco Use  . Smoking status: Never Smoker  . Smokeless tobacco: Never Used  Substance and Sexual Activity  . Alcohol use: No  . Drug use: No  . Sexual activity: Not on file  Lifestyle  . Physical activity:    Days per week: Not on file    Minutes per session: Not on file  . Stress: Not on file  Relationships  . Social connections:    Talks on phone: Not on file    Gets together: Not on file    Attends religious service: Not on file    Active member of club or organization: Not on file    Attends meetings of clubs or organizations: Not on file    Relationship status: Not on file  . Intimate partner violence:    Fear of current or ex partner: Not on file    Emotionally abused: Not on file    Physically abused: Not on file    Forced sexual activity: Not on file    Other Topics Concern  . Not on file  Social History Narrative  . Not on file   PMHx: Past Medical History:  Diagnosis Date  . Atrial fibrillation ([place]) 11/2018   NEW  . Gout   . Sleep apnea    USES CPAP  . Testicular cancer [facility])    Surgical Hx: Past Surgical History:  Procedure Laterality Date  . CARDIOVERSION N/A 11/26/2018   Procedure: CARDIOVERSION;  Surgeon: Acie Fredrickson Wonda Cheng, MD;  Location: [facility] ENDOSCOPY;  Service: Cardiovascular;  Laterality: N/A;  . EXTERNAL FIXATION LEG Right 06/24/2016   Procedure: EXTERNAL FIXATION LEG;  Surgeon: Renette Butters, MD;  Location: [place];  Service: Orthopedics;  Laterality: Right;  . EXTERNAL FIXATION REMOVAL Right 06/27/2016   Procedure: REMOVAL EXTERNAL FIXATION LEG;  Surgeon: Renette Butters, MD;  Location: [place];  Service: Orthopedics;  Laterality: Right;  . ORIF ANKLE FRACTURE Left 07/03/2016   Procedure: OPEN REDUCTION INTERNAL FIXATION (ORIF) ANKLE FRACTURE; DRESSING CHANGE RIGHT LEG;  Surgeon: Renette Butters, MD;  Location: [place];  Service: Orthopedics;  Laterality: Left;  . ORIF TIBIA PLATEAU Right 06/27/2016  Procedure: OPEN REDUCTION INTERNAL FIXATION (ORIF) TIBIAL PLATEAU;  Surgeon: Renette Butters, MD;  Location: [place];  Service: Orthopedics;  Laterality: Right;  . SURGERY SCROTAL / TESTICULAR    . TEE WITHOUT CARDIOVERSION N/A 11/26/2018   Procedure: TRANSESOPHAGEAL ECHOCARDIOGRAM (TEE);  Surgeon: Acie Fredrickson Wonda Cheng, MD;  Location: [facility] ENDOSCOPY;  Service: Cardiovascular;  Laterality: N/A;   Allergies: Allergies  Allergen Reactions  . Peanut-Containing Drug Products Nausea And Vomiting    Pt reports tolerance to small amount of peanut now   Physical Exam: Vitals:   12/03/18 1539  BP: (!) 122/96  Pulse: (!) 104  Temp: 98.3 F (36.8 C)  TempSrc: Oral  SpO2: 96%  Weight: 277 lb 14.4 oz (126.1 kg)  Height: 5\' 9"  (1.753 m)   Physical Exam Constitutional:      General: He is not in acute distress.    Appearance: He is  well-developed. He is not diaphoretic.  HENT:     Head: Normocephalic and atraumatic.  Eyes:     Conjunctiva/sclera: Conjunctivae normal.  Neck:     Musculoskeletal: Normal range of motion.  Cardiovascular:     Rate and Rhythm: Tachycardia present. Rhythm irregular.     Heart sounds: Murmur present.  Pulmonary:     Effort: Pulmonary effort is normal. No respiratory distress.     Breath sounds: Normal breath sounds. No wheezing.  Abdominal:     General: Bowel sounds are normal. There is no distension.     Palpations: Abdomen is soft.  Skin:    General: Skin is warm.  Neurological:     Mental Status: He is alert.    Assessment & Plan:   See Encounters Tab for problem based charting.  Patient discussed with Dr. Evette Doffing

## 2018-12-03 NOTE — Assessment & Plan Note (Signed)
Hematuria: Given his history of urological cancer and hematuria he was advised to see urology. Cardiology has placed this referral today. Please make sure it has been completed at his next PCP visit.

## 2018-12-03 NOTE — Assessment & Plan Note (Signed)
Mitral regurgitation: Patient to see Anthony Byrd of cardiology on 01/01/2019 after seeing them today. He will continue current therapy.

## 2018-12-03 NOTE — Assessment & Plan Note (Signed)
Atrial fibrillation: Rate elevated to 104 today, rhythm irregular on auscultation. Patient asymptomatic. Was seen by cards today who recommended weight loss.  [name] the weight loss at length in an attempt to provide guideline based recommendations and assistance. Please see AVS. He stated that cardiology wished for him to lose weight to better increase the odds of a successful cardioversion.  He endorses adherence to his current medication regimen.  Recommended mild/moderate exercise due to Afib pattern.   Plan: Continue Diltiazem Continue Metoprolol Continue xarelto

## 2018-12-03 NOTE — Patient Instructions (Addendum)
FOLLOW-UP INSTRUCTIONS When: In 1-2 months for a routine follow-up with a new PCP if you decide to follow with our clinic What to bring: All of your medications   I have not made any changes to your medications today.   Today we discussed your need for weight loss. I highly recommend a more focused approach to lifestyle modifications with increased diety modification as opposed to high intensity exercise. Slowly increase the activity as tolerated with walking, using a gym bike, swimming, but please do not over exert yourself given your heart condition.   As far as diet modifications, less sodium (less than 2grams per day) is acceptable. Less fast food, less sweet drinks, fewer servings of white bread, candy, or fatty foods.  I recommend that you concentrate on increasing to 3-4 servings of vegetables, and 3-4 servings of fruit each day. Also, try to avoid as much red meat and eat more chicken and fish.  If you develop a rapid heart beat, chest discomfort, weakness, feel overly fatigued, dizzy, lightheaded or near pass out, please call our office. If you pass out, or have chest pain, please call 911.  Thank you for your visit to the [facility] today. If you have any questions or concerns please call us at [phone].    At meals, divide your plate into four equal parts: ? Fill one-half of your plate with vegetables and green salads. ? Fill one-fourth of your plate with whole grains. ? Fill one-fourth of your plate with lean protein foods.  Eat 4-5 servings of vegetables per day. A serving of vegetables is: ? 1 cup of raw or cooked vegetables. ? 2 cups of raw leafy greens.  Eat 4-5 servings of fruit per day. A serving of fruit is: ? 1 medium whole fruit. ?  cup of dried fruit. ?  cup of fresh, frozen, or canned fruit (without syrup).  Eat more foods that have soluble fiber. These are apples, broccoli, carrots, beans, peas, and barley. Try to get 20-30 g of fiber per day.  Eat  4-5 servings of nuts, legumes, and seeds per week: ? 1 serving of dried beans or legumes equals  cup after being cooked. ? 1 serving of nuts is  cup. ? 1 serving of seeds equals 1 tablespoon.

## 2018-12-04 ENCOUNTER — Ambulatory Visit: Payer: Self-pay

## 2018-12-04 NOTE — Progress Notes (Signed)
Internal Medicine Clinic Attending  Case discussed with Dr. Berline Lopes  at the time of the visit.  We reviewed the resident's history and exam and pertinent patient test results.  I agree with the assessment, diagnosis, and plan of care documented in the resident's note.  As a correction, the patient is here with a chief complaint of follow up for atrial fibrillation.

## 2018-12-04 NOTE — Progress Notes (Addendum)
Primary Care Physician: [facility] Internal Medicine Referring Physician: [facility] f/u   Anthony Byrd is a 48 y.o. male with a h/o obesity, untreated OSA, remote testicular cancer, that is in the Afib clinic for treatment of newly diagnosed afib. He presented to the ER with cough, shortness of breath and LLE. He was in  afib with rvr. He underwent TEE guided cardioversion which was unsuccessful. He is new start to  xarelto 20 mg daily as well as diltiazem 360 mg daily.  In the afib office, he remains in afib at 103 bpm. He is asymptomatic. He is no longer short of breath , LLE much improved. He continues on lasix daily. He is not weighting daily. He is not using his cpap. No exercise program. Is noticing nightly hematuria, which started in the hospital. He denies any significant alcohol or caffeine use. No tobacco.   It was mentioned that  repeat cardioversion or AAD therapy may be appropriate as an outpatient.   Today, he denies symptoms of palpitations, chest pain, shortness of breath, orthopnea, PND, lower extremity edema, dizziness, presyncope, syncope, or neurologic sequela. The patient is tolerating medications without difficulties and is otherwise without complaint today.   Past Medical History:  Diagnosis Date  . Atrial fibrillation ([place]) 11/2018   NEW  . Gout   . Sleep apnea    USES CPAP  . Testicular cancer [facility])    Past Surgical History:  Procedure Laterality Date  . CARDIOVERSION N/A 11/26/2018   Procedure: CARDIOVERSION;  Surgeon: Acie Fredrickson Wonda Cheng, MD;  Location: [facility] ENDOSCOPY;  Service: Cardiovascular;  Laterality: N/A;  . EXTERNAL FIXATION LEG Right 06/24/2016   Procedure: EXTERNAL FIXATION LEG;  Surgeon: Renette Butters, MD;  Location: [place];  Service: Orthopedics;  Laterality: Right;  . EXTERNAL FIXATION REMOVAL Right 06/27/2016   Procedure: REMOVAL EXTERNAL FIXATION LEG;  Surgeon: Renette Butters, MD;  Location: [place];  Service: Orthopedics;  Laterality: Right;  . ORIF ANKLE  FRACTURE Left 07/03/2016   Procedure: OPEN REDUCTION INTERNAL FIXATION (ORIF) ANKLE FRACTURE; DRESSING CHANGE RIGHT LEG;  Surgeon: Renette Butters, MD;  Location: [place];  Service: Orthopedics;  Laterality: Left;  . ORIF TIBIA PLATEAU Right 06/27/2016   Procedure: OPEN REDUCTION INTERNAL FIXATION (ORIF) TIBIAL PLATEAU;  Surgeon: Renette Butters, MD;  Location: [place];  Service: Orthopedics;  Laterality: Right;  . SURGERY SCROTAL / TESTICULAR    . TEE WITHOUT CARDIOVERSION N/A 11/26/2018   Procedure: TRANSESOPHAGEAL ECHOCARDIOGRAM (TEE);  Surgeon: Acie Fredrickson Wonda Cheng, MD;  Location: [facility] ENDOSCOPY;  Service: Cardiovascular;  Laterality: N/A;    Current Outpatient Medications  Medication Sig Dispense Refill  . acetaminophen (TYLENOL) 325 MG tablet Take 2 tablets (650 mg total) by mouth every 6 (six) hours as needed for mild pain. 60 tablet 0  . diltiazem (CARDIZEM CD) 360 MG 24 hr capsule Take 1 capsule (360 mg total) by mouth daily. 30 capsule 0  . furosemide (LASIX) 40 MG tablet Take 1 tablet (40 mg total) by mouth 2 (two) times daily. 60 tablet 0  . multivitamin (ONE-A-DAY MEN'S) TABS tablet Take 1 tablet by mouth daily.    . rivaroxaban (XARELTO) 20 MG TABS tablet Take 1 tablet (20 mg total) by mouth daily with supper. 30 tablet 0  . methocarbamol (ROBAXIN) 500 MG tablet Take 1 tablet (500 mg total) by mouth 3 (three) times daily. (Patient not taking: Reported on 12/03/2018) 45 tablet 0  . metoprolol tartrate (LOPRESSOR) 25 MG tablet Take 0.5 tablets (  12.5 mg total) by mouth 2 (two) times daily. 180 tablet 3   No current facility-administered medications for this encounter.     Allergies  Allergen Reactions  . Peanut-Containing Drug Products Nausea And Vomiting    Pt reports tolerance to small amount of peanut now    Social History   Socioeconomic History  . Marital status: Legally Separated    Spouse name: Not on file  . Number of children: Not on file  . Years of education: Not on file  .  Highest education level: Not on file  Occupational History  . Not on file  Social Needs  . Financial resource strain: Not on file  . Food insecurity:    Worry: Not on file    Inability: Not on file  . Transportation needs:    Medical: Not on file    Non-medical: Not on file  Tobacco Use  . Smoking status: Never Smoker  . Smokeless tobacco: Never Used  Substance and Sexual Activity  . Alcohol use: No  . Drug use: No  . Sexual activity: Not on file  Lifestyle  . Physical activity:    Days per week: Not on file    Minutes per session: Not on file  . Stress: Not on file  Relationships  . Social connections:    Talks on phone: Not on file    Gets together: Not on file    Attends religious service: Not on file    Active member of club or organization: Not on file    Attends meetings of clubs or organizations: Not on file    Relationship status: Not on file  . Intimate partner violence:    Fear of current or ex partner: Not on file    Emotionally abused: Not on file    Physically abused: Not on file    Forced sexual activity: Not on file  Other Topics Concern  . Not on file  Social History Narrative  . Not on file    No family history on file.  ROS- All systems are reviewed and negative except as per the HPI above  Physical Exam: Vitals:   12/03/18 1420  BP: 136/88  Pulse: (!) 103  Weight: 125.2 kg  Height: 5\' 9"  (1.753 m)   Wt Readings from Last 3 Encounters:  12/03/18 125.2 kg  12/03/18 126.1 kg  11/27/18 125.7 kg    Labs: Lab Results  Component Value Date   NA 139 12/03/2018   K 4.1 12/03/2018   CL 103 12/03/2018   CO2 26 12/03/2018   GLUCOSE 95 12/03/2018   BUN 15 12/03/2018   CREATININE 0.98 12/03/2018   CALCIUM 9.2 12/03/2018   MG 1.9 11/23/2018   Lab Results  Component Value Date   INR 1.09 11/23/2018   No results found for: CHOL, HDL, LDLCALC, TRIG   GEN- The patient is well appearing, alert and oriented x 3 today.   Head-  normocephalic, atraumatic Eyes-  Sclera clear, conjunctiva pink Ears- hearing intact Oropharynx- clear Neck- supple, no JVP Lymph- no cervical lymphadenopathy Lungs- Clear to ausculation bilaterally, normal work of breathing Heart- irregular rate and rhythm, no murmurs, rubs or gallops, PMI not laterally displaced GI- soft, NT, ND, + BS Extremities- no clubbing, cyanosis, or edema MS- no significant deformity or atrophy Skin- no rash or lesion Psych- euthymic mood, full affect Neuro- strength and sensation are intact  EKG- afib at 103 bpm, normal intervals. Echo - Study Conclusions  - Left ventricle:  The cavity size was normal. Wall thickness was   increased in a pattern of mild LVH. Systolic function was normal.   The estimated ejection fraction was in the range of 60% to 65%.   Wall motion was normal; there were no regional wall motion   abnormalities. - Aortic valve: Valve mobility was restricted. There was very mild   stenosis. There was trivial regurgitation. Valve area (VTI): 2.11   cm^2. Valve area (Vmax): 1.77 cm^2. Valve area (Vmean): 1.86   cm^2. - Mitral valve: There was moderate to severe regurgitation. - Left atrium: The atrium was moderately dilated.  Impressions:  - Normal LV systolic function; mild LVH; calcified aortic valve   with very mild AS and trace AI; moderate to severe MR; moderate   LAE; mild TR. TEE-Study Conclusions  TEE- Left ventricle: The cavity size was normal. The estimated   ejection fraction was in the range of 55% to 60%. No evidence of   thrombus. - Mitral valve: There was mild regurgitation. - Left atrium: No evidence of thrombus in the atrial cavity or   appendage. - Tricuspid valve: There was moderate regurgitation.  Impressions:  - Cardioversion was performed after the TEE   Assessment and Plan: 1. New onset afib General education and triggers discussed Continue cardizem 360 mg daily Add metoprolol 25 mg 1/2 tab bid  for better rate control Means of restoring SR were discussed, I feel pt would benefit from  aggressive lifestyle changes before discussing another attempt at cardioversion Plus with his hematuria, I would rather that be addressed first, as he  he just had a cardioversion, anticoagulation should not be stopped for 4 weeks from procedure  2.BMI of 41.04 Encouraged use of CPAP Highly  encouraged weight loss and exercise program Will be referred to [facility] for free 3 month introductory program  3. CHA2DS2VASc score of 0 For now continue xarelto for previous cardioversion and most likely another attempt in the near future  4. Hematuria Urology appointment requested Sees blood in urine mostly at night  H/o of remote testicular CA CBC today  5. Heart failure symptoms Resolved Buy scales and weigh daily Avoid salt continue lasix  Bmet today  F/u in afib clinic in one month  Butch Penny C. Meghna Hagmann, [facility] [address] [place], [place] [address] [phone]

## 2018-12-09 ENCOUNTER — Telehealth: Payer: Self-pay

## 2018-12-09 NOTE — Telephone Encounter (Signed)
Spoke to Anthony Byrd about the 12-week PREP at [employer].  He is asking for a schedule of classes be emailed so he can figure out what works best with his schedule and will let me know.

## 2018-12-26 ENCOUNTER — Other Ambulatory Visit: Payer: Self-pay | Admitting: Pharmacist

## 2018-12-26 DIAGNOSIS — I4819 Other persistent atrial fibrillation: Secondary | ICD-10-CM

## 2018-12-26 DIAGNOSIS — I340 Nonrheumatic mitral (valve) insufficiency: Secondary | ICD-10-CM

## 2018-12-26 MED ORDER — DILTIAZEM HCL ER COATED BEADS 360 MG PO CP24: 360.0000 mg | ORAL_CAPSULE | Freq: Every day | ORAL | 0 refills | Status: DC

## 2018-12-26 MED ORDER — RIVAROXABAN 20 MG PO TABS: 20.0000 mg | ORAL_TABLET | Freq: Every day | ORAL | 0 refills | Status: DC

## 2018-12-26 MED ORDER — FUROSEMIDE 40 MG PO TABS: 40.0000 mg | ORAL_TABLET | Freq: Two times a day (BID) | ORAL | 0 refills | Status: DC

## 2018-12-27 ENCOUNTER — Other Ambulatory Visit (HOSPITAL_COMMUNITY): Payer: Self-pay | Admitting: *Deleted

## 2018-12-27 DIAGNOSIS — I4819 Other persistent atrial fibrillation: Secondary | ICD-10-CM

## 2018-12-27 DIAGNOSIS — I340 Nonrheumatic mitral (valve) insufficiency: Secondary | ICD-10-CM

## 2018-12-27 MED ORDER — RIVAROXABAN 20 MG PO TABS: 20.0000 mg | ORAL_TABLET | Freq: Every day | ORAL | 0 refills | Status: DC

## 2018-12-27 MED ORDER — FUROSEMIDE 40 MG PO TABS: 40.0000 mg | ORAL_TABLET | Freq: Two times a day (BID) | ORAL | 0 refills | Status: DC

## 2018-12-27 MED ORDER — DILTIAZEM HCL ER COATED BEADS 360 MG PO CP24: 360.0000 mg | ORAL_CAPSULE | Freq: Every day | ORAL | 0 refills | Status: DC

## 2019-01-01 ENCOUNTER — Ambulatory Visit (HOSPITAL_COMMUNITY): Payer: Self-pay | Admitting: Nurse Practitioner

## 2019-01-01 ENCOUNTER — Other Ambulatory Visit (HOSPITAL_COMMUNITY): Payer: Self-pay | Admitting: *Deleted

## 2019-01-01 DIAGNOSIS — I4819 Other persistent atrial fibrillation: Secondary | ICD-10-CM

## 2019-01-01 MED ORDER — DILTIAZEM HCL ER COATED BEADS 360 MG PO CP24: 360.0000 mg | ORAL_CAPSULE | Freq: Every day | ORAL | 3 refills | Status: DC

## 2019-01-20 ENCOUNTER — Other Ambulatory Visit (HOSPITAL_COMMUNITY): Payer: Self-pay | Admitting: *Deleted

## 2019-01-20 DIAGNOSIS — I4819 Other persistent atrial fibrillation: Secondary | ICD-10-CM

## 2019-01-20 MED ORDER — DILTIAZEM HCL ER COATED BEADS 360 MG PO CP24: 360.0000 mg | ORAL_CAPSULE | Freq: Every day | ORAL | 3 refills | Status: DC

## 2019-01-27 ENCOUNTER — Ambulatory Visit (HOSPITAL_COMMUNITY): Payer: Self-pay | Admitting: Nurse Practitioner

## 2019-01-28 ENCOUNTER — Other Ambulatory Visit: Payer: Self-pay | Admitting: Internal Medicine

## 2019-01-28 DIAGNOSIS — I340 Nonrheumatic mitral (valve) insufficiency: Secondary | ICD-10-CM

## 2019-01-30 ENCOUNTER — Other Ambulatory Visit (HOSPITAL_COMMUNITY): Payer: Self-pay | Admitting: *Deleted

## 2019-01-30 DIAGNOSIS — I340 Nonrheumatic mitral (valve) insufficiency: Secondary | ICD-10-CM

## 2019-01-30 MED ORDER — FUROSEMIDE 40 MG PO TABS: 40.0000 mg | ORAL_TABLET | Freq: Two times a day (BID) | ORAL | 0 refills | Status: DC

## 2019-01-31 NOTE — Telephone Encounter (Signed)
Patient will need follow-up appointment as noted at my last visit with him if he is to continued treatment and refills from our clinic for a BMP and BP check.   Kathi Ludwig, MD [facility] Internal Medicine, PGY-2

## 2019-01-31 NOTE — Telephone Encounter (Signed)
Called pt - no answer, left message to call the office to schedule an appt.

## 2019-02-05 ENCOUNTER — Telehealth: Payer: Self-pay

## 2019-02-05 NOTE — Telephone Encounter (Signed)
Spoke to Anthony Byrd about the temporary closure of the [employer] and postponement of the PREP.  Will [facility].

## 2019-03-05 ENCOUNTER — Other Ambulatory Visit (HOSPITAL_COMMUNITY): Payer: Self-pay | Admitting: *Deleted

## 2019-03-05 DIAGNOSIS — I340 Nonrheumatic mitral (valve) insufficiency: Secondary | ICD-10-CM

## 2019-03-05 DIAGNOSIS — I4819 Other persistent atrial fibrillation: Secondary | ICD-10-CM

## 2019-03-05 MED ORDER — FUROSEMIDE 40 MG PO TABS: 40.0000 mg | ORAL_TABLET | Freq: Two times a day (BID) | ORAL | 1 refills | Status: DC

## 2019-03-05 MED ORDER — DILTIAZEM HCL ER COATED BEADS 360 MG PO CP24: 360.0000 mg | ORAL_CAPSULE | Freq: Every day | ORAL | 3 refills | Status: DC

## 2019-03-06 ENCOUNTER — Encounter (HOSPITAL_COMMUNITY): Payer: Self-pay | Admitting: Nurse Practitioner

## 2019-03-06 ENCOUNTER — Other Ambulatory Visit: Payer: Self-pay

## 2019-03-06 ENCOUNTER — Ambulatory Visit (HOSPITAL_COMMUNITY)
Admission: RE | Admit: 2019-03-06 | Discharge: 2019-03-06 | Disposition: A | Discharge status: Home / Self Care | Payer: Self-pay | Source: Ambulatory Visit | Attending: Nurse Practitioner | Admitting: Nurse Practitioner

## 2019-03-06 DIAGNOSIS — I4819 Other persistent atrial fibrillation: Secondary | ICD-10-CM

## 2019-03-06 DIAGNOSIS — I340 Nonrheumatic mitral (valve) insufficiency: Secondary | ICD-10-CM

## 2019-03-06 MED ORDER — FUROSEMIDE 40 MG PO TABS: 40.0000 mg | ORAL_TABLET | Freq: Two times a day (BID) | ORAL | 6 refills | Status: DC

## 2019-03-06 MED ORDER — RIVAROXABAN 20 MG PO TABS: 20.0000 mg | ORAL_TABLET | Freq: Every day | ORAL | 6 refills | Status: DC

## 2019-03-06 NOTE — Addendum Note (Signed)
Encounter addended by: Sherran Needs, NP on: 03/06/2019 3:47 PM  Actions taken: Chief Complaint modified, Clinical Note Signed, Visit Navigator Flowsheet section accepted, Charge Capture section accepted, Medication List reviewed, Problem List reviewed, Allergies reviewed

## 2019-03-06 NOTE — Progress Notes (Signed)
Primary Care Physician: [facility] Internal Medicine Referring Physician: [facility] f/u  Due to Covid -19 restrictions it is thought best to evaluate pt via audio visit today.Anthony Byrd is a 48 y.o. male with a h/o obesity, untreated OSA, remote testicular cancer, that is in the Afib clinic for treatment of newly diagnosed afib. He presented to the ER, 11/22/18, with cough, shortness of breath and LLE. He was in  afib with rvr. He underwent TEE guided cardioversion which was unsuccessful. He is new start to  xarelto 20 mg daily as well as diltiazem 360 mg daily.  On last visit, pt was suppose to have f/u early February to discuss AAD or repeat ablation. He failed to show up. He was also having hematuria and was suppose to have urology f/u which did not happen, reasons unclear. He states that the hematuria has stopped. He states that he is still taking all of his meds.   I spoke to him on the phone and he feels that he is doing well. He can not tell if he still is in afib. He does not check his BP/HR at home.   Today, he denies symptoms of palpitations, chest pain, shortness of breath, orthopnea, PND, lower extremity edema, dizziness, presyncope, syncope, or neurologic sequela. The patient is tolerating medications without difficulties and is otherwise without complaint today.   Past Medical History:  Diagnosis Date  . Atrial fibrillation ([place]) 11/2018   NEW  . Gout   . Sleep apnea    USES CPAP  . Testicular cancer [facility])    Past Surgical History:  Procedure Laterality Date  . CARDIOVERSION N/A 11/26/2018   Procedure: CARDIOVERSION;  Surgeon: Acie Fredrickson Wonda Cheng, MD;  Location: [facility] ENDOSCOPY;  Service: Cardiovascular;  Laterality: N/A;  . EXTERNAL FIXATION LEG Right 06/24/2016   Procedure: EXTERNAL FIXATION LEG;  Surgeon: Renette Butters, MD;  Location: [place];  Service: Orthopedics;  Laterality: Right;  . EXTERNAL FIXATION REMOVAL Right 06/27/2016   Procedure: REMOVAL EXTERNAL FIXATION LEG;   Surgeon: Renette Butters, MD;  Location: [place];  Service: Orthopedics;  Laterality: Right;  . ORIF ANKLE FRACTURE Left 07/03/2016   Procedure: OPEN REDUCTION INTERNAL FIXATION (ORIF) ANKLE FRACTURE; DRESSING CHANGE RIGHT LEG;  Surgeon: Renette Butters, MD;  Location: [place];  Service: Orthopedics;  Laterality: Left;  . ORIF TIBIA PLATEAU Right 06/27/2016   Procedure: OPEN REDUCTION INTERNAL FIXATION (ORIF) TIBIAL PLATEAU;  Surgeon: Renette Butters, MD;  Location: [place];  Service: Orthopedics;  Laterality: Right;  . SURGERY SCROTAL / TESTICULAR    . TEE WITHOUT CARDIOVERSION N/A 11/26/2018   Procedure: TRANSESOPHAGEAL ECHOCARDIOGRAM (TEE);  Surgeon: Acie Fredrickson Wonda Cheng, MD;  Location: [facility] ENDOSCOPY;  Service: Cardiovascular;  Laterality: N/A;    Current Outpatient Medications  Medication Sig Dispense Refill  . acetaminophen (TYLENOL) 325 MG tablet Take 2 tablets (650 mg total) by mouth every 6 (six) hours as needed for mild pain. 60 tablet 0  . diltiazem (CARDIZEM CD) 360 MG 24 hr capsule Take 1 capsule (360 mg total) by mouth daily. 30 capsule 3  . furosemide (LASIX) 40 MG tablet Take 1 tablet (40 mg total) by mouth 2 (two) times daily. 60 tablet 6  . methocarbamol (ROBAXIN) 500 MG tablet Take 1 tablet (500 mg total) by mouth 3 (three) times daily. (Patient not taking: Reported on 12/03/2018) 45 tablet 0  . metoprolol tartrate (LOPRESSOR) 25 MG tablet Take 0.5 tablets (12.5 mg total) by mouth 2 (  two) times daily. 180 tablet 3  . multivitamin (ONE-A-DAY MEN'S) TABS tablet Take 1 tablet by mouth daily.    . rivaroxaban (XARELTO) 20 MG TABS tablet Take 1 tablet (20 mg total) by mouth daily with supper for 30 days. 30 tablet 6   No current facility-administered medications for this encounter.     Allergies  Allergen Reactions  . Peanut-Containing Drug Products Nausea And Vomiting    Pt reports tolerance to small amount of peanut now    Social History   Socioeconomic History  . Marital status:  Legally Separated    Spouse name: Not on file  . Number of children: Not on file  . Years of education: Not on file  . Highest education level: Not on file  Occupational History  . Not on file  Social Needs  . Financial resource strain: Not on file  . Food insecurity:    Worry: Not on file    Inability: Not on file  . Transportation needs:    Medical: Not on file    Non-medical: Not on file  Tobacco Use  . Smoking status: Never Smoker  . Smokeless tobacco: Never Used  Substance and Sexual Activity  . Alcohol use: No  . Drug use: No  . Sexual activity: Not on file  Lifestyle  . Physical activity:    Days per week: Not on file    Minutes per session: Not on file  . Stress: Not on file  Relationships  . Social connections:    Talks on phone: Not on file    Gets together: Not on file    Attends religious service: Not on file    Active member of club or organization: Not on file    Attends meetings of clubs or organizations: Not on file    Relationship status: Not on file  . Intimate partner violence:    Fear of current or ex partner: Not on file    Emotionally abused: Not on file    Physically abused: Not on file    Forced sexual activity: Not on file  Other Topics Concern  . Not on file  Social History Narrative  . Not on file    No family history on file.  ROS- All systems are reviewed and negative except as per the HPI above  Physical Exam: There were no vitals filed for this visit. Wt Readings from Last 3 Encounters:  12/03/18 125.2 kg  12/03/18 126.1 kg  11/27/18 125.7 kg    Labs: Lab Results  Component Value Date   NA 139 12/03/2018   K 4.1 12/03/2018   CL 103 12/03/2018   CO2 26 12/03/2018   GLUCOSE 95 12/03/2018   BUN 15 12/03/2018   CREATININE 0.98 12/03/2018   CALCIUM 9.2 12/03/2018   MG 1.9 11/23/2018   Lab Results  Component Value Date   INR 1.09 11/23/2018   No results found for: CHOL, HDL, LDLCALC, TRIG   GEN- NA, phone visit   EKG- n/a Echo - Study Conclusions  - Left ventricle: The cavity size was normal. Wall thickness was   increased in a pattern of mild LVH. Systolic function was normal.   The estimated ejection fraction was in the range of 60% to 65%.   Wall motion was normal; there were no regional wall motion   abnormalities. - Aortic valve: Valve mobility was restricted. There was very mild   stenosis. There was trivial regurgitation. Valve area (VTI): 2.11   cm^2. Valve  area (Vmax): 1.77 cm^2. Valve area (Vmean): 1.86   cm^2. - Mitral valve: There was moderate to severe regurgitation. - Left atrium: The atrium was moderately dilated.  Impressions:  - Normal LV systolic function; mild LVH; calcified aortic valve   with very mild AS and trace AI; moderate to severe MR; moderate   LAE; mild TR. TEE-Study Conclusions  TEE- Left ventricle: The cavity size was normal. The estimated   ejection fraction was in the range of 55% to 60%. No evidence of   thrombus. - Mitral valve: There was mild regurgitation. - Left atrium: No evidence of thrombus in the atrial cavity or   appendage. - Tricuspid valve: There was moderate regurgitation.  Impressions:  - Cardioversion was performed after the TEE   Assessment and Plan: 1. New onset afib General education and triggers discussed Continue cardizem 360 mg daily Metoprolol 25 mg 1/2 tab bid for better rate control Means of restoring SR were discussed, I feel pt would benefit from return to SR but with current covid-19 , will discuss  more when restrictions are lifted    2.BMI of 41.04 Encouraged use of CPAP Highly  encouraged weight loss and exercise program Referred to [facility] for free 3 month introductory program, however this is on hold 2/2 Covid restrictions  3. CHA2DS2VASc score of 0 For now continue xarelto for  most likely another attempt in the near future to rstore SR  4. Hematuria Pt did not f/u with urology States that hematuria  resolved  5. Heart failure symptoms Resolved weigh daily Avoid salt continue lasix    F/u in afib clinic in 3 months  Butch Penny C. Melissa Tomaselli, [facility] [address] [place],  [address] [phone]

## 2019-03-07 ENCOUNTER — Ambulatory Visit: Payer: Self-pay

## 2019-08-29 ENCOUNTER — Inpatient Hospital Stay (HOSPITAL_COMMUNITY)
Admission: EM | Admit: 2019-08-29 | Discharge: 2019-08-30 | DRG: 918 | Disposition: A | Discharge status: Home / Self Care | Payer: Self-pay | Attending: Internal Medicine | Admitting: Internal Medicine

## 2019-08-29 ENCOUNTER — Inpatient Hospital Stay (HOSPITAL_COMMUNITY): Payer: Self-pay

## 2019-08-29 ENCOUNTER — Encounter (HOSPITAL_COMMUNITY): Payer: Self-pay | Admitting: Emergency Medicine

## 2019-08-29 ENCOUNTER — Emergency Department (HOSPITAL_COMMUNITY): Payer: Self-pay

## 2019-08-29 ENCOUNTER — Other Ambulatory Visit: Payer: Self-pay

## 2019-08-29 DIAGNOSIS — Z8547 Personal history of malignant neoplasm of testis: Secondary | ICD-10-CM

## 2019-08-29 DIAGNOSIS — R030 Elevated blood-pressure reading, without diagnosis of hypertension: Secondary | ICD-10-CM | POA: Diagnosis present

## 2019-08-29 DIAGNOSIS — Z9119 Patient's noncompliance with other medical treatment and regimen: Secondary | ICD-10-CM

## 2019-08-29 DIAGNOSIS — T40711A Poisoning by cannabis, accidental (unintentional), initial encounter: Secondary | ICD-10-CM

## 2019-08-29 DIAGNOSIS — Y92019 Unspecified place in single-family (private) house as the place of occurrence of the external cause: Secondary | ICD-10-CM

## 2019-08-29 DIAGNOSIS — I081 Rheumatic disorders of both mitral and tricuspid valves: Secondary | ICD-10-CM | POA: Diagnosis present

## 2019-08-29 DIAGNOSIS — E876 Hypokalemia: Secondary | ICD-10-CM

## 2019-08-29 DIAGNOSIS — Z7901 Long term (current) use of anticoagulants: Secondary | ICD-10-CM

## 2019-08-29 DIAGNOSIS — I4819 Other persistent atrial fibrillation: Secondary | ICD-10-CM | POA: Diagnosis present

## 2019-08-29 DIAGNOSIS — E860 Dehydration: Secondary | ICD-10-CM | POA: Diagnosis present

## 2019-08-29 DIAGNOSIS — R7303 Prediabetes: Secondary | ICD-10-CM | POA: Diagnosis present

## 2019-08-29 DIAGNOSIS — D72829 Elevated white blood cell count, unspecified: Secondary | ICD-10-CM | POA: Diagnosis present

## 2019-08-29 DIAGNOSIS — Z8679 Personal history of other diseases of the circulatory system: Secondary | ICD-10-CM

## 2019-08-29 DIAGNOSIS — I4891 Unspecified atrial fibrillation: Secondary | ICD-10-CM

## 2019-08-29 DIAGNOSIS — R823 Hemoglobinuria: Secondary | ICD-10-CM | POA: Diagnosis present

## 2019-08-29 DIAGNOSIS — Z20828 Contact with and (suspected) exposure to other viral communicable diseases: Secondary | ICD-10-CM | POA: Diagnosis present

## 2019-08-29 DIAGNOSIS — F064 Anxiety disorder due to known physiological condition: Secondary | ICD-10-CM | POA: Diagnosis present

## 2019-08-29 DIAGNOSIS — T407X1A Poisoning by cannabis (derivatives), accidental (unintentional), initial encounter: Principal | ICD-10-CM | POA: Diagnosis present

## 2019-08-29 DIAGNOSIS — N179 Acute kidney failure, unspecified: Secondary | ICD-10-CM

## 2019-08-29 DIAGNOSIS — I5032 Chronic diastolic (congestive) heart failure: Secondary | ICD-10-CM | POA: Diagnosis present

## 2019-08-29 DIAGNOSIS — Z6841 Body Mass Index (BMI) 40.0 and over, adult: Secondary | ICD-10-CM

## 2019-08-29 DIAGNOSIS — E872 Acidosis: Secondary | ICD-10-CM | POA: Diagnosis present

## 2019-08-29 DIAGNOSIS — G4733 Obstructive sleep apnea (adult) (pediatric): Secondary | ICD-10-CM | POA: Diagnosis present

## 2019-08-29 DIAGNOSIS — D751 Secondary polycythemia: Secondary | ICD-10-CM

## 2019-08-29 DIAGNOSIS — N132 Hydronephrosis with renal and ureteral calculous obstruction: Secondary | ICD-10-CM | POA: Diagnosis present

## 2019-08-29 DIAGNOSIS — R509 Fever, unspecified: Secondary | ICD-10-CM

## 2019-08-29 DIAGNOSIS — R809 Proteinuria, unspecified: Secondary | ICD-10-CM | POA: Diagnosis present

## 2019-08-29 DIAGNOSIS — Z79899 Other long term (current) drug therapy: Secondary | ICD-10-CM

## 2019-08-29 HISTORY — DX: Cannabis use, unspecified, uncomplicated: F12.90

## 2019-08-29 HISTORY — DX: Chronic diastolic (congestive) heart failure: I50.32

## 2019-08-29 HISTORY — DX: Nonrheumatic mitral (valve) insufficiency: I34.0

## 2019-08-29 HISTORY — DX: Morbid (severe) obesity due to excess calories: E66.01

## 2019-08-29 HISTORY — DX: Unspecified atrial fibrillation: I48.91

## 2019-08-29 HISTORY — DX: Rheumatic tricuspid insufficiency: I07.1

## 2019-08-29 LAB — COMPREHENSIVE METABOLIC PANEL
ALT: 30 U/L (ref 0–44)
AST: 38 U/L (ref 15–41)
Albumin: 3.2 g/dL — ABNORMAL LOW (ref 3.5–5.0)
Alkaline Phosphatase: 57 U/L (ref 38–126)
Anion gap: 11 (ref 5–15)
BUN: 15 mg/dL (ref 6–20)
CO2: 22 mmol/L (ref 22–32)
Calcium: 8.5 mg/dL — ABNORMAL LOW (ref 8.9–10.3)
Chloride: 104 mmol/L (ref 98–111)
Creatinine, Ser: 1.32 mg/dL — ABNORMAL HIGH (ref 0.61–1.24)
GFR calc Af Amer: >60 mL/min (ref >60)
GFR calc non Af Amer: >60 mL/min (ref >60)
Glucose, Bld: 120 mg/dL — ABNORMAL HIGH (ref 70–99)
Potassium: 3.8 mmol/L (ref 3.5–5.1)
Sodium: 137 mmol/L (ref 135–145)
Total Bilirubin: 0.6 mg/dL (ref 0.3–1.2)
Total Protein: 6.2 g/dL — ABNORMAL LOW (ref 6.5–8.1)

## 2019-08-29 LAB — BASIC METABOLIC PANEL
Anion gap: 16 — ABNORMAL HIGH (ref 5–15)
BUN: 19 mg/dL (ref 6–20)
CO2: 23 mmol/L (ref 22–32)
Calcium: 9.2 mg/dL (ref 8.9–10.3)
Chloride: 98 mmol/L (ref 98–111)
Creatinine, Ser: 1.63 mg/dL — ABNORMAL HIGH (ref 0.61–1.24)
GFR calc Af Amer: 56 mL/min — ABNORMAL LOW (ref >60)
GFR calc non Af Amer: 48 mL/min — ABNORMAL LOW (ref >60)
Glucose, Bld: 198 mg/dL — ABNORMAL HIGH (ref 70–99)
Potassium: 2.9 mmol/L — ABNORMAL LOW (ref 3.5–5.1)
Sodium: 137 mmol/L (ref 135–145)

## 2019-08-29 LAB — APTT: aPTT: 26 seconds (ref 24–36)

## 2019-08-29 LAB — CBC WITH DIFFERENTIAL/PLATELET
Abs Immature Granulocytes: 0.07 10*3/uL (ref 0.00–0.07)
Basophils Absolute: 0.1 10*3/uL (ref 0.0–0.1)
Basophils Relative: 1 %
Eosinophils Absolute: 0.1 10*3/uL (ref 0.0–0.5)
Eosinophils Relative: 1 %
HCT: 54.9 % — ABNORMAL HIGH (ref 39.0–52.0)
Hemoglobin: 18 g/dL — ABNORMAL HIGH (ref 13.0–17.0)
Immature Granulocytes: 1 %
Lymphocytes Relative: 41 %
Lymphs Abs: 5.2 10*3/uL — ABNORMAL HIGH (ref 0.7–4.0)
MCH: 28.8 pg (ref 26.0–34.0)
MCHC: 32.8 g/dL (ref 30.0–36.0)
MCV: 88 fL (ref 80.0–100.0)
Monocytes Absolute: 1.5 10*3/uL — ABNORMAL HIGH (ref 0.1–1.0)
Monocytes Relative: 12 %
Neutro Abs: 5.8 10*3/uL (ref 1.7–7.7)
Neutrophils Relative %: 44 %
Platelets: 328 10*3/uL (ref 150–400)
RBC: 6.24 MIL/uL — ABNORMAL HIGH (ref 4.22–5.81)
RDW: 14.2 % (ref 11.5–15.5)
WBC: 12.7 10*3/uL — ABNORMAL HIGH (ref 4.0–10.5)
nRBC: 0 % (ref 0.0–0.2)

## 2019-08-29 LAB — HEPARIN LEVEL (UNFRACTIONATED)
Heparin Unfractionated: <0.1 IU/mL — ABNORMAL LOW (ref 0.30–0.70)
Heparin Unfractionated: 0.22 IU/mL — ABNORMAL LOW (ref 0.30–0.70)

## 2019-08-29 LAB — URINALYSIS, ROUTINE W REFLEX MICROSCOPIC
Bilirubin Urine: NEGATIVE
Glucose, UA: NEGATIVE mg/dL
Ketones, ur: NEGATIVE mg/dL
Nitrite: NEGATIVE
Protein, ur: >=300 mg/dL — AB
Specific Gravity, Urine: 1.016 (ref 1.005–1.030)
pH: 5 (ref 5.0–8.0)

## 2019-08-29 LAB — TSH: TSH: 0.424 u[IU]/mL (ref 0.350–4.500)

## 2019-08-29 LAB — MRSA PCR SCREENING: MRSA by PCR: NEGATIVE

## 2019-08-29 LAB — RAPID URINE DRUG SCREEN, HOSP PERFORMED
Amphetamines: NOT DETECTED
Barbiturates: NOT DETECTED
Benzodiazepines: NOT DETECTED
Cocaine: NOT DETECTED
Opiates: NOT DETECTED
Tetrahydrocannabinol: POSITIVE — AB

## 2019-08-29 LAB — PROTEIN / CREATININE RATIO, URINE
Creatinine, Urine: 159.12 mg/dL
Protein Creatinine Ratio: 2.04 mg/mg{Cre} — ABNORMAL HIGH (ref 0.00–0.15)
Total Protein, Urine: 325 mg/dL

## 2019-08-29 LAB — PROTIME-INR
INR: 0.9 (ref 0.8–1.2)
Prothrombin Time: 12.3 seconds (ref 11.4–15.2)

## 2019-08-29 LAB — LACTIC ACID, PLASMA
Lactic Acid, Venous: 2.3 mmol/L (ref 0.5–1.9)
Lactic Acid, Venous: 1.9 mmol/L (ref 0.5–1.9)
Lactic Acid, Venous: 2.2 mmol/L (ref 0.5–1.9)

## 2019-08-29 LAB — GLUCOSE, CAPILLARY
Glucose-Capillary: 109 mg/dL — ABNORMAL HIGH (ref 70–99)
Glucose-Capillary: 111 mg/dL — ABNORMAL HIGH (ref 70–99)

## 2019-08-29 LAB — CBG MONITORING, ED: Glucose-Capillary: 111 mg/dL — ABNORMAL HIGH (ref 70–99)

## 2019-08-29 LAB — HEMOGLOBIN A1C
Hgb A1c MFr Bld: 6.1 % — ABNORMAL HIGH (ref 4.8–5.6)
Mean Plasma Glucose: 128.37 mg/dL

## 2019-08-29 LAB — MAGNESIUM: Magnesium: 1.9 mg/dL (ref 1.7–2.4)

## 2019-08-29 LAB — SARS CORONAVIRUS 2 (TAT 6-24 HRS): SARS Coronavirus 2: NEGATIVE

## 2019-08-29 LAB — ETHANOL: Alcohol, Ethyl (B): <10 mg/dL (ref <10)

## 2019-08-29 LAB — T4, FREE: Free T4: 0.93 ng/dL (ref 0.61–1.12)

## 2019-08-29 MED ORDER — LACTATED RINGERS IV SOLN: INTRAVENOUS | Status: DC

## 2019-08-29 MED ORDER — MAGNESIUM SULFATE 2 GM/50ML IV SOLN
2.0000 g | Freq: Once | INTRAVENOUS | Status: AC
  Administered 2019-08-29: 2 g via INTRAVENOUS
  Filled 2019-08-29: qty 50

## 2019-08-29 MED ORDER — INSULIN ASPART 100 UNIT/ML ~~LOC~~ SOLN
0.0000 [IU] | Freq: Three times a day (TID) | SUBCUTANEOUS | Status: DC
  Administered 2019-08-30: 1 [IU] via SUBCUTANEOUS

## 2019-08-29 MED ORDER — ADULT MULTIVITAMIN W/MINERALS CH
1.0000 | ORAL_TABLET | Freq: Every day | ORAL | Status: DC
  Administered 2019-08-29 – 2019-08-30 (×2): 1 via ORAL
  Filled 2019-08-29 (×2): qty 1

## 2019-08-29 MED ORDER — ACETAMINOPHEN 325 MG PO TABS: 650.0000 mg | ORAL_TABLET | Freq: Three times a day (TID) | ORAL | Status: DC | PRN

## 2019-08-29 MED ORDER — DILTIAZEM HCL-DEXTROSE 125-5 MG/125ML-% IV SOLN (PREMIX)
5.0000 mg/h | INTRAVENOUS | Status: DC
  Administered 2019-08-29: 5 mg/h via INTRAVENOUS
  Filled 2019-08-29: qty 125

## 2019-08-29 MED ORDER — ENOXAPARIN SODIUM 40 MG/0.4ML ~~LOC~~ SOLN: 40.0000 mg | SUBCUTANEOUS | Status: DC

## 2019-08-29 MED ORDER — FOLIC ACID 1 MG PO TABS
1.0000 mg | ORAL_TABLET | Freq: Every day | ORAL | Status: DC
  Administered 2019-08-29 – 2019-08-30 (×2): 1 mg via ORAL
  Filled 2019-08-29 (×2): qty 1

## 2019-08-29 MED ORDER — METOPROLOL TARTRATE 5 MG/5ML IV SOLN
5.0000 mg | Freq: Once | INTRAVENOUS | Status: AC
  Administered 2019-08-29: 5 mg via INTRAVENOUS
  Filled 2019-08-29: qty 5

## 2019-08-29 MED ORDER — VITAMIN B-1 100 MG PO TABS
100.0000 mg | ORAL_TABLET | Freq: Every day | ORAL | Status: DC
  Administered 2019-08-29 – 2019-08-30 (×2): 100 mg via ORAL
  Filled 2019-08-29 (×2): qty 1

## 2019-08-29 MED ORDER — POTASSIUM CHLORIDE 10 MEQ/100ML IV SOLN
10.0000 meq | Freq: Once | INTRAVENOUS | Status: AC
  Administered 2019-08-29: 10 meq via INTRAVENOUS
  Filled 2019-08-29: qty 100

## 2019-08-29 MED ORDER — SODIUM CHLORIDE 0.9 % IV SOLN
INTRAVENOUS | Status: DC
  Administered 2019-08-29 (×2): via INTRAVENOUS

## 2019-08-29 MED ORDER — POTASSIUM CHLORIDE CRYS ER 20 MEQ PO TBCR
40.0000 meq | EXTENDED_RELEASE_TABLET | Freq: Once | ORAL | Status: AC
  Administered 2019-08-29: 40 meq via ORAL
  Filled 2019-08-29: qty 2

## 2019-08-29 MED ORDER — DILTIAZEM LOAD VIA INFUSION
15.0000 mg | Freq: Once | INTRAVENOUS | Status: AC
  Administered 2019-08-29: 15 mg via INTRAVENOUS
  Filled 2019-08-29: qty 15

## 2019-08-29 MED ORDER — HEPARIN (PORCINE) 25000 UT/250ML-% IV SOLN
1800.0000 [IU]/h | INTRAVENOUS | Status: DC
  Administered 2019-08-29: 1600 [IU]/h via INTRAVENOUS
  Filled 2019-08-29: qty 250

## 2019-08-29 MED ORDER — ACETAMINOPHEN 325 MG PO TABS
650.0000 mg | ORAL_TABLET | Freq: Once | ORAL | Status: AC
  Administered 2019-08-29: 650 mg via ORAL
  Filled 2019-08-29: qty 2

## 2019-08-29 MED ORDER — SODIUM CHLORIDE 0.9 % IV BOLUS
1000.0000 mL | Freq: Once | INTRAVENOUS | Status: AC
  Administered 2019-08-29: 1000 mL via INTRAVENOUS

## 2019-08-29 MED ORDER — APIXABAN 5 MG PO TABS
5.0000 mg | ORAL_TABLET | Freq: Two times a day (BID) | ORAL | Status: DC
  Administered 2019-08-29: 5 mg via ORAL
  Filled 2019-08-29: qty 1

## 2019-08-29 MED ORDER — METOPROLOL TARTRATE 12.5 MG HALF TABLET
12.5000 mg | ORAL_TABLET | Freq: Two times a day (BID) | ORAL | Status: DC
  Administered 2019-08-29 – 2019-08-30 (×2): 12.5 mg via ORAL
  Filled 2019-08-29 (×3): qty 1

## 2019-08-29 MED ORDER — DILTIAZEM HCL 60 MG PO TABS
90.0000 mg | ORAL_TABLET | Freq: Four times a day (QID) | ORAL | Status: DC
  Administered 2019-08-29 – 2019-08-30 (×4): 90 mg via ORAL
  Filled 2019-08-29 (×4): qty 2

## 2019-08-29 MED ORDER — HEPARIN BOLUS VIA INFUSION
4000.0000 [IU] | Freq: Once | INTRAVENOUS | Status: AC
  Administered 2019-08-29: 4000 [IU] via INTRAVENOUS
  Filled 2019-08-29: qty 4000

## 2019-08-29 NOTE — ED Triage Notes (Signed)
Pt BIB [facility] from home, reports that he experimented with "pot brownies". Pt ate approx 1/3 of the pan. Hx afib, c/o shortness of breath and palpitations. EMS reports HR 200-240, irregular. Pt non-compliant with meds, unsure if he took his meds this am.

## 2019-08-29 NOTE — Progress Notes (Signed)
Heparin drip increase to 83ml/hr, Merrily Brittle verified pump setting

## 2019-08-29 NOTE — Plan of Care (Signed)
Initiating  Care Plan Problem: Education: Goal: Knowledge of General Education information will improve Description: Including pain rating scale, medication(s)/side effects and non-pharmacologic comfort measures Outcome: Progressing   Problem: Health Behavior/Discharge Planning: Goal: Ability to manage health-related needs will improve Outcome: Progressing   Problem: Clinical Measurements: Goal: Ability to maintain clinical measurements within normal limits will improve Outcome: Progressing Goal: Will remain free from infection Outcome: Progressing Goal: Diagnostic test results will improve Outcome: Progressing Goal: Respiratory complications will improve Outcome: Progressing Goal: Cardiovascular complication will be avoided Outcome: Progressing   Problem: Activity: Goal: Risk for activity intolerance will decrease Outcome: Progressing   Problem: Nutrition: Goal: Adequate nutrition will be maintained Outcome: Progressing   Problem: Coping: Goal: Level of anxiety will decrease Outcome: Progressing   Problem: Elimination: Goal: Will not experience complications related to bowel motility Outcome: Progressing Goal: Will not experience complications related to urinary retention Outcome: Progressing   Problem: Pain Managment: Goal: General experience of comfort will improve Outcome: Progressing   Problem: Safety: Goal: Ability to remain free from injury will improve Outcome: Progressing   Problem: Skin Integrity: Goal: Risk for impaired skin integrity will decrease Outcome: Progressing

## 2019-08-29 NOTE — Plan of Care (Signed)
  Problem: Clinical Measurements: Goal: Ability to maintain clinical measurements within normal limits will improve Outcome: Progressing   Problem: Clinical Measurements: Goal: Respiratory complications will improve Outcome: Progressing   Problem: Clinical Measurements: Goal: Cardiovascular complication will be avoided Outcome: Progressing   
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
## 2019-08-29 NOTE — ED Notes (Signed)
[employer], friend. [id number] has pt's car and wants to be called when pt is coherent.

## 2019-08-29 NOTE — Progress Notes (Signed)
ANTICOAGULATION CONSULT NOTE - Initial Consult  Pharmacy Consult for Heparin Indication: atrial fibrillation  Allergies  Allergen Reactions  . Peanut-Containing Drug Products Nausea And Vomiting    Pt reports tolerance to small amount of peanut now    Patient Measurements:   Heparin Dosing Weight: 100 kg  Vital Signs: Temp: 100 F (37.8 C) (10/09 0111) Temp Source: Oral (10/09 0111) BP: 120/107 (10/09 0245) Pulse Rate: 88 (10/09 0245)  Labs: Recent Labs    08/29/19 0138  HGB 18.0*  HCT 54.9*  PLT 328  APTT 26  LABPROT 12.3  INR 0.9  HEPARINUNFRC <0.10*  CREATININE 1.63*    CrCl cannot be calculated (Unknown ideal weight.).   Medical History: Past Medical History:  Diagnosis Date  . Atrial fibrillation ([place]) 11/2018   NEW  . Gout   . Sleep apnea    USES CPAP  . Testicular cancer ([place])     Medications:  No current facility-administered medications on file prior to encounter.    Current Outpatient Medications on File Prior to Encounter  Medication Sig Dispense Refill  . acetaminophen (TYLENOL) 325 MG tablet Take 2 tablets (650 mg total) by mouth every 6 (six) hours as needed for mild pain. 60 tablet 0  . diltiazem (CARDIZEM CD) 360 MG 24 hr capsule Take 1 capsule (360 mg total) by mouth daily. 30 capsule 3  . furosemide (LASIX) 40 MG tablet Take 1 tablet (40 mg total) by mouth 2 (two) times daily. 60 tablet 6  . methocarbamol (ROBAXIN) 500 MG tablet Take 1 tablet (500 mg total) by mouth 3 (three) times daily. (Patient not taking: Reported on 12/03/2018) 45 tablet 0  . metoprolol tartrate (LOPRESSOR) 25 MG tablet Take 0.5 tablets (12.5 mg total) by mouth 2 (two) times daily. 180 tablet 3  . multivitamin (ONE-A-DAY MEN'S) TABS tablet Take 1 tablet by mouth daily.    . rivaroxaban (XARELTO) 20 MG TABS tablet Take 1 tablet (20 mg total) by mouth daily with supper for 30 days. 30 tablet 6     Assessment: 48 y.o. male with Afib for heparin.  Baseline anti-Xa  level < 0.1, indicating noncompliance with Xarelto  Goal of Therapy:  Heparin level 0.3-0.7 units/ml Monitor platelets by anticoagulation protocol: Yes   Plan:  Heparin 4000 units IV bolus, then start heparin 1600 units/hr Check heparin level in 8 hours.   Anniebelle Devore, Bronson Curb 08/29/2019,2:51 AM

## 2019-08-29 NOTE — Consult Note (Addendum)
Cardiology Consultation:   Patient ID: LARSON RAITT; [id number]; 10-Feb-1971   Admit date: 08/29/2019 Date of Consult: 08/29/2019  Primary Care Provider: Kathi Ludwig, MD Primary Cardiologist: Buford Dresser, MD Primary Electrophysiologist:  None  Chief Complaint: delirium  Patient Profile:   Anthony Byrd is a 48 y.o. male with a hx of persistent atrial fibrillation, untreated OSA, morbid obesity, hematuria (not yet worked up), remote testicular cancer, medical noncompliance who is being seen today for the evaluation of atrial fib at the request of Dr. Roxanne Mins.  History of Present Illness:   To recap history, the patient was admitted 11/2018 with 1-2 week history of cough, SOB, and lower extremity edema. He was found to be in atrial fib RVR with associated acute diastolic CHF. Echocardiogram showed LVEF 60-65%, mild LVH, moderate to severe mitral regurgitation, and moderate left atrial dilatation. He was treated with diltiazem and Xarelto. He underwent TEE on 11/26/18 which showed normal LVF, mild-moderate MR/TR, no LAA thrombus per Dr. Elmarie Shiley note. DCCV x3 was attempted but was unsuccessful. He saw Roderic Palau back in the afib clinic 11/2018 and metoprolol was added for rate control. Butch Penny recommended initial rate control strategy with lifestyle modification for 2 reasons: 1) he was having hematuria at the time pending further urology evaluation and 2) he also had untreated OSA that needed to be addressed. He was supposed to have f/u early February to discuss AAD or repeat ablation but failed to show up. He was seen back virtually 02/2019. Given the pandemic, the plan was to defer rhythm control strategy and revisit when restrictions were lifted. F/u 3 months was recommended but he did not schedule this appointment.   This admission, he presented to the ER overnight with acute delirium. Per triage note, patient was screaming out "don't take me devil" then screaming "take me Jesus,  I want to see you" along with screaming religious songs. He was concerned for possible overdose as he had baked some marijuana brownies and ate approximately 1/3 to 1/2 of the pan. Per H/P, he had not been taking any Xarelto recently due to prohibitive cost.The patient is currently a poor historian so it's difficult for to get a consistent answer of exactly what he's been taking, except that he hasn't been taking some of his medicine for an unknown period of time. He told the IM service he took a Lasix after eating the brownies but wasn't sure why. He falls asleep easily during the conversation. When asked how he feels he says "I am still just really high." He denies any CP, palpitations or dyspnea. It sounds like he did have some SOB yesterday for a period of time. He reports chronic LEE. No syncope.  As part of his workup he was found to be mildly febrile without clear source of infection (Tmax 100.9). Labs notable for negative SARS-CoV2, hypokalemia of 2.9 (3.8 s/p repletion), leukocytosis of 12.7, elevated hemoglobin of 18, mild lactic acidosis, AKI with Cr 1.63 (improved to 1.32), >300 protein in UA, normal thyroid, UDS otherwise negative aside from THC, ETOH negative. Renal US shows mild left hydronephrosis and probable nonobstructing stones in the right kidney. He was treated with 2 doses of IV metoprolol, 15mg  diltiazem bolus with subsequent drip with improvement in HRs to the 80s-90s.   Past Medical History:  Diagnosis Date   Chronic diastolic (congestive) heart failure ([place])    a. dx 11/2018 in context of afib.   Gout    Marijuana use    Mitral  regurgitation    Morbid obesity ([facility])    Persistent atrial fibrillation ([place]) 11/2018   NEW   Sleep apnea    USES CPAP   Testicular cancer ([facility])    Tricuspid regurgitation     Past Surgical History:  Procedure Laterality Date   CARDIOVERSION N/A 11/26/2018   Procedure: CARDIOVERSION;  Surgeon: Acie Fredrickson Wonda Cheng, MD;  Location: [facility];  Service: Cardiovascular;  Laterality: N/A;   EXTERNAL FIXATION LEG Right 06/24/2016   Procedure: EXTERNAL FIXATION LEG;  Surgeon: Renette Butters, MD;  Location: [facility];  Service: Orthopedics;  Laterality: Right;   EXTERNAL FIXATION REMOVAL Right 06/27/2016   Procedure: REMOVAL EXTERNAL FIXATION LEG;  Surgeon: Renette Butters, MD;  Location: [facility];  Service: Orthopedics;  Laterality: Right;   ORIF ANKLE FRACTURE Left 07/03/2016   Procedure: OPEN REDUCTION INTERNAL FIXATION (ORIF) ANKLE FRACTURE; DRESSING CHANGE RIGHT LEG;  Surgeon: Renette Butters, MD;  Location: [facility];  Service: Orthopedics;  Laterality: Left;   ORIF TIBIA PLATEAU Right 06/27/2016   Procedure: OPEN REDUCTION INTERNAL FIXATION (ORIF) TIBIAL PLATEAU;  Surgeon: Renette Butters, MD;  Location: [facility];  Service: Orthopedics;  Laterality: Right;   SURGERY SCROTAL / TESTICULAR     TEE WITHOUT CARDIOVERSION N/A 11/26/2018   Procedure: TRANSESOPHAGEAL ECHOCARDIOGRAM (TEE);  Surgeon: Thayer Headings, MD;  Location: [facility];  Service: Cardiovascular;  Laterality: N/A;     Inpatient Medications: Scheduled Meds:  folic acid  1 mg Oral Daily   insulin aspart  0-9 Units Subcutaneous TID WC   multivitamin with minerals  1 tablet Oral Daily   thiamine  100 mg Oral Daily   Continuous Infusions:  sodium chloride 75 mL/hr at 08/29/19 1121   diltiazem (CARDIZEM) infusion 5 mg/hr (08/29/19 0627)   heparin 1,800 Units/hr (08/29/19 1350)   PRN Meds: acetaminophen  Home Meds: Prior to Admission medications   Medication Sig Start Date End Date Taking? Authorizing Provider  acetaminophen (TYLENOL) 325 MG tablet Take 2 tablets (650 mg total) by mouth every 6 (six) hours as needed for mild pain. 07/06/16  Yes Meuth, Brooke A, PA-C  diltiazem (CARDIZEM CD) 360 MG 24 hr capsule Take 1 capsule (360 mg total) by mouth daily. 03/05/19  Yes Sherran Needs, NP  furosemide (LASIX) 40 MG tablet Take 1 tablet (40 mg total) by  mouth 2 (two) times daily. 03/06/19  Yes Sherran Needs, NP  multivitamin (ONE-A-DAY MEN'S) TABS tablet Take 1 tablet by mouth daily.   Yes [provider]  metoprolol tartrate (LOPRESSOR) 25 MG tablet Take 0.5 tablets (12.5 mg total) by mouth 2 (two) times daily. Patient not taking: Reported on 08/29/2019 12/03/18 03/03/19  Sherran Needs, NP  rivaroxaban (XARELTO) 20 MG TABS tablet Take 1 tablet (20 mg total) by mouth daily with supper for 30 days. Patient not taking: Reported on 08/29/2019 03/06/19 04/05/19  Sherran Needs, NP    Allergies:    Allergies  Allergen Reactions   Peanut-Containing Drug Products Nausea And Vomiting    Pt reports tolerance to small amount of peanut now    Social History:   Social History   Socioeconomic History   Marital status: Legally Separated    Spouse name: Not on file   Number of children: Not on file   Years of education: Not on file   Highest education level: Not on file  Occupational History   Not on file  Social Needs   Financial resource strain: Not on  file   Food insecurity    Worry: Not on file    Inability: Not on file   Transportation needs    Medical: Not on file    Non-medical: Not on file  Tobacco Use   Smoking status: Never Smoker   Smokeless tobacco: Never Used  Substance and Sexual Activity   Alcohol use: No   Drug use: Yes    Types: Marijuana   Sexual activity: Not on file  Lifestyle   Physical activity    Days per week: Not on file    Minutes per session: Not on file   Stress: Not on file  Relationships   Social connections    Talks on phone: Not on file    Gets together: Not on file    Attends religious service: Not on file    Active member of club or organization: Not on file    Attends meetings of clubs or organizations: Not on file    Relationship status: Not on file   Intimate partner violence    Fear of current or ex partner: Not on file    Emotionally abused: Not on file     Physically abused: Not on file    Forced sexual activity: Not on file  Other Topics Concern   Not on file  Social History Narrative   Not on file     Family History:   Family History  Problem Relation Age of Onset   CAD Neg Hx    Stroke Neg Hx    He denies any family history of anything (but is impaired)  ROS:  Please see the history of present illness.  + h/o hematuria All other ROS reviewed and negative.     Physical Exam/Data:   Vitals:   08/29/19 1305 08/29/19 1350 08/29/19 1351 08/29/19 1400  BP: (!) 130/110 (!) 121/91  (!) 141/95  Pulse: 94 (!) 107 (!) 103 (!) 108  Resp: 16 (!) 24 (!) 22 19  Temp:      TempSrc:      SpO2: 90%   99%    Intake/Output Summary (Last 24 hours) at 08/29/2019 1520 Last data filed at 08/29/2019 1102 Gross per 24 hour  Intake 50 ml  Output --  Net 50 ml   Last 3 Weights 12/03/2018 12/03/2018 11/27/2018  Weight (lbs) 277 lb 14.4 oz 276 lb 277 lb 3.2 oz  Weight (kg) 126.055 kg 125.193 kg 125.737 kg     There is no height or weight on file to calculate BMI.  General: Morbidly obese WM in no acute distress. Head: Normocephalic, atraumatic, sclera non-icteric, no xanthomas, nares are without discharge.  Neck: Negative for carotid bruits. JVD not elevated. Lungs: Clear bilaterally to auscultation without wheezes, rales, or rhonchi. Breathing is unlabored. Heart: Irregularly irregular, rate controlled, with S1 S2. No murmurs, rubs, or gallops appreciated. Abdomen: Soft, non-tender, non-distended with normoactive bowel sounds. No hepatomegaly. No rebound/guarding. No obvious abdominal masses. Msk:  Strength and tone appear normal for age. Extremities: No clubbing or cyanosis. Trace-1+ stiff chronic appearing lower extremity edema at the sockline (superimposed on baseline obesity so difficult to quantify). Distal pedal pulses are 2+ and equal bilaterally. Neuro: Sleepy but easily arousable - oriented X 2 but close to date (10/8). No facial  asymmetry. No focal deficit. Moves all extremities spontaneously. Psych: Flat affect  EKG:  The EKG was personally reviewed and demonstrates:  Atrial fib 176bpm, nonspecific STT changes with diffuse TWI III, III, avF, V5-V6. On f/u  tracing when HR better controlled, TW changes improved.  Telemetry:  Telemetry was personally reviewed and demonstrates:  afib with improved HR 80s-90s  Relevant CV Studies: TEE 11/26/18 Study Conclusions  - Left ventricle: The cavity size was normal. The estimated   ejection fraction was in the range of 55% to 60%. No evidence of   thrombus. - Mitral valve: There was mild regurgitation. - Left atrium: No evidence of thrombus in the atrial cavity or   appendage. - Tricuspid valve: There was moderate regurgitation.  Impressions:  - Cardioversion was performed after the TEE     We shocked him 3 times at 200 J.   The cardioversion was not successful . No cardiac source of   emboli was indentified.  Laboratory Data:  High Sensitivity Troponin:  No results for input(s): TROPONINIHS in the last 720 hours.   Cardiac EnzymesNo results for input(s): TROPONINI in the last 168 hours. No results for input(s): TROPIPOC in the last 168 hours.  Chemistry Recent Labs  Lab 09-17-2019 0138 09-17-2019 0854  NA 137 137  K 2.9* 3.8  CL 98 104  CO2 23 22  GLUCOSE 198* 120*  BUN 19 15  CREATININE 1.63* 1.32*  CALCIUM 9.2 8.5*  GFRNONAA 48* >60  GFRAA 56* >60  ANIONGAP 16* 11    Recent Labs  Lab 09-17-19 0854  PROT 6.2*  ALBUMIN 3.2*  AST 38  ALT 30  ALKPHOS 57  BILITOT 0.6   Hematology Recent Labs  Lab 09/17/2019 0138  WBC 12.7*  RBC 6.24*  HGB 18.0*  HCT 54.9*  MCV 88.0  MCH 28.8  MCHC 32.8  RDW 14.2  PLT 328   BNPNo results for input(s): BNP, PROBNP in the last 168 hours.  DDimer No results for input(s): DDIMER in the last 168 hours.   Radiology/Studies:  US Renal  Result Date: 17-Sep-2019 CLINICAL DATA:  Acute renal injury EXAM: RENAL /  URINARY TRACT ULTRASOUND COMPLETE COMPARISON:  None. FINDINGS: Right Kidney: Renal measurements: 11.5 x 6.8 x 6.2 cm = volume: 257 mL. There is a question nonobstructing kidney stone versus several stones in a cluster. The echotexture of the kidney is otherwise normal. There is no right hydronephrosis. Cortical thinning is noted. Left Kidney: Renal measurements: 12.8 x 6.7 x 6.5 cm = volume: 290 mL. Echogenicity within normal limits. No mass is noted. There is question mild left hydronephrosis. Cortical thinning is noted. Bladder: Appears normal for degree of bladder distention. Bilateral ureteral jets are noted. IMPRESSION: Probable nonobstructing stones in the right kidney. Question mild left hydronephrosis. Bilateral ureteral jets are identified in the bladder. Electronically Signed   By: Abelardo Diesel M.D.   On: Sep 17, 2019 11:16   Dg Chest Port 1 View  Result Date: 09/17/2019 CLINICAL DATA:  AFib EXAM: PORTABLE CHEST 1 VIEW COMPARISON:  November 23, 2018 FINDINGS: There is mild cardiomegaly. Both lungs are clear. The visualized skeletal structures are unremarkable. IMPRESSION: No acute cardiopulmonary process. Electronically Signed   By: Prudencio Pair M.D.   On: 09-17-19 01:58    Assessment and Plan:   1. Persistent atrial fib - not surprising that patient has had uncontrolled rate in setting of medication noncompliance. He had unsuccessful DCCV in 11/2018 so has likely been out of rhythm since that time. F/u has been spotty and it sounds like he has been out of his medications as well. CHADSVASC is 1 for CHF - blood pressure mildly elevated intermittently so may qualify for HTN as well. He will  need to demonstrate medication compliance before DCCV could be reconsidered. Given that this was unsuccessful back in 11/2018, would likely need consideration of antiarrhythmic before re-trial. This can be revisited in the afib clinic. For now would pursue rate control strategy. Would consider transition back to  oral home meds (I.e. dilt 90mg  q6hr to start and lopressor 12.5mg  BID).  He is on heparin. Will review anticoag with Dr. MD given his hematuria - Hgb is stable.  2. THC intoxication, low grade fever, mild lactic acidosis - per primary team.  3. H/o diastolic CHF - his sockline edema/skin thickening appears to be a chronic process. Morbid obesity makes volume status generally challenging. CXR without pulmonary edema. Diuretic on hold given AKI.  4. Morbid obesity with OSA - not clear if patient uses CPAP as he is currently a poor historian. Lifestyle modification will be important going forward.  5. Mitral and tricuspid regurgitation - this was noted as mild-moderate by TEE narrative 11/2018. The actual report states mild MR and moderate TR.  6. Acute kidney injury with proteinuria - Cr improved with IV fluids, judicious use given h/o fluid overload. Further w/u of proteinuria per IM.  7. Hematuria - persistent despite patient stopping Xarelto. Sounds like this was present before DOAC initiation as well. He never f/u with urology as instructed. IM to address. Renal US pending.  For questions or updates, please contact [place] Please consult [email or web address] for contact info under   Signed, Charlie Pitter, PA-C  08/29/2019 3:20 PM

## 2019-08-29 NOTE — Progress Notes (Signed)
[facility] for Heparin Indication: atrial fibrillation  Allergies  Allergen Reactions  . Peanut-Containing Drug Products Nausea And Vomiting    Pt reports tolerance to small amount of peanut now    Patient Measurements:   Heparin Dosing Weight: 100 kg  Vital Signs: Temp: 99.9 F (37.7 C) (10/09 0616) Temp Source: Oral (10/09 0616) BP: 130/110 (10/09 1305) Pulse Rate: 94 (10/09 1305)  Labs: Recent Labs    08/29/19 0138 08/29/19 0854 08/29/19 1214  HGB 18.0*  --   --   HCT 54.9*  --   --   PLT 328  --   --   APTT 26  --   --   LABPROT 12.3  --   --   INR 0.9  --   --   HEPARINUNFRC <0.10*  --  0.22*  CREATININE 1.63* 1.32*  --     CrCl cannot be calculated (Unknown ideal weight.).   Medical History: Past Medical History:  Diagnosis Date  . Atrial fibrillation ([place]) 11/2018   NEW  . Gout   . Sleep apnea    USES CPAP  . Testicular cancer ([place])     Medications:  No current facility-administered medications on file prior to encounter.    Current Outpatient Medications on File Prior to Encounter  Medication Sig Dispense Refill  . acetaminophen (TYLENOL) 325 MG tablet Take 2 tablets (650 mg total) by mouth every 6 (six) hours as needed for mild pain. 60 tablet 0  . diltiazem (CARDIZEM CD) 360 MG 24 hr capsule Take 1 capsule (360 mg total) by mouth daily. 30 capsule 3  . furosemide (LASIX) 40 MG tablet Take 1 tablet (40 mg total) by mouth 2 (two) times daily. 60 tablet 6  . multivitamin (ONE-A-DAY MEN'S) TABS tablet Take 1 tablet by mouth daily.    . metoprolol tartrate (LOPRESSOR) 25 MG tablet Take 0.5 tablets (12.5 mg total) by mouth 2 (two) times daily. (Patient not taking: Reported on 08/29/2019) 180 tablet 3  . rivaroxaban (XARELTO) 20 MG TABS tablet Take 1 tablet (20 mg total) by mouth daily with supper for 30 days. (Patient not taking: Reported on 08/29/2019) 30 tablet 6  . [DISCONTINUED] methocarbamol (ROBAXIN) 500 MG  tablet Take 1 tablet (500 mg total) by mouth 3 (three) times daily. (Patient not taking: Reported on 12/03/2018) 45 tablet 0     Assessment: 48 y.o. male with Afib for heparin.  Baseline anti-Xa level < 0.1, indicating noncompliance with Xarelto.  Initial heparin level low at 0.22. No bleeding or IV issues noted.  Goal of Therapy:  Heparin level 0.3-0.7 units/ml Monitor platelets by anticoagulation protocol: Yes   Plan:  Increase heparin to 1800 units/hr Recheck level in 6 hours   Erin Hearing PharmD., BCPS Clinical Pharmacist 08/29/2019 1:40 PM

## 2019-08-29 NOTE — ED Notes (Signed)
Pt transported to ultrasound.

## 2019-08-29 NOTE — ED Notes (Signed)
PAGED ADMITTING PER RN  

## 2019-08-29 NOTE — ED Notes (Signed)
Pt screaming out "don't take me devil." Then screaming "take me Jesus, I want to see you." along with screaming religious songs.

## 2019-08-29 NOTE — ED Notes (Signed)
Pt not answering when RN asks questions. Pt keeps eyes closes and states, "Just let me go."

## 2019-08-29 NOTE — H&P (Addendum)
Date: 08/29/2019               Patient Name:  Anthony Byrd MRN: [id number]  DOB: [birth year] Age / Sex: 49 y.o., male   PCP: Kathi Ludwig, MD         Medical Service: Internal Medicine Teaching Service         Attending Physician: Dr. Velna Ochs, MD    First Contact: Dr. Darrick Meigs Pager: [phone]  Second Contact: Dr. Sharon Seller Pager: [phone]       After Hours (After 5p/  First Contact Pager: [phone]  weekends / holidays): Second Contact Pager: [phone]   Chief Complaint: "I ate too many pot brownies"  History of Present Illness: Anthony Byrd is a 48 yo M w/ a PMHx notable for atrial fibrillation s/p unsuccessful cardioversion 11/26/2018, OSA, testicular cancer, gout and HTN who presented to the ED via EMS due to severe tachycardia after consuming ~3 grams of marijuana in an edible. The patient stated that he placed a quarter of marijuana in a single brownie mix and consumed almost 1/2 of this. He was attempting to treat his lower extremity MSK pain and did not feel any improvement after the first few brownies. As such, he continued to consume these until he noticed improvement over about a 36min period. He also endorses consuming at least one lasix 40mg  tablet after eating the brownies.  He denied consumption of any other substances or drugs.  Patient stated that he has not been taking his Xarelto for the past several months due to prohibitive cost given that he was not going to have the ablation due to COVID restrictions on elective procedures.  In addition, he has only been intermittently taking his diltiazem and rarely has metoprolol as he does not feel that these help.  He denied being aware of prominent palpitations or other symptoms. He denied fever, chills, nausea, vomiting, cough, sputum production, sore throat, runny nose, chest pain, abdominal pain, visual changes, headache, muscle aches or pains, dysuria, hematuria, polyuria, diarrhea, constipation, or blood in  stool.  Meds:  Current Meds  Medication Sig  . acetaminophen (TYLENOL) 325 MG tablet Take 2 tablets (650 mg total) by mouth every 6 (six) hours as needed for mild pain.  Marland Kitchen diltiazem (CARDIZEM CD) 360 MG 24 hr capsule Take 1 capsule (360 mg total) by mouth daily.  . furosemide (LASIX) 40 MG tablet Take 1 tablet (40 mg total) by mouth 2 (two) times daily.  . multivitamin (ONE-A-DAY MEN'S) TABS tablet Take 1 tablet by mouth daily.   Allergies: Allergies as of 08/29/2019 - Review Complete 08/29/2019  Allergen Reaction Noted  . Peanut-containing drug products Nausea And Vomiting 06/23/2016   Past Medical History:  Diagnosis Date  . Atrial fibrillation ([place]) 11/2018   NEW  . Gout   . Sleep apnea    USES CPAP  . Testicular cancer [facility])     Family History:  Patient stated that the following run in his family but could not specify in whom: DMII, CHF, CKD and HTN  Social History:  Patient denied tobacco use, alcohol consumption, illicit drug use He rents a room from his landlord does not have pets He would like for [place] to call his Manson Allan with updates: [phone]  Review of Systems: A complete ROS was negative except as per HPI.   Physical Exam: Blood pressure (!) 120/46, pulse (!) 108, temperature 99.9 F (37.7 C), temperature source Oral, resp. rate (!) 22, SpO2 94 %.  Physical Exam Constitutional:      Appearance: He is well-developed. He is obese. He is not toxic-appearing or diaphoretic.  HENT:     Head: Normocephalic and atraumatic.  Eyes:     Conjunctiva/sclera:     Right eye: Right conjunctiva is injected.     Left eye: Left conjunctiva is injected.     Pupils: Pupils are equal, round, and reactive to light.  Neck:     Musculoskeletal: Normal range of motion.  Cardiovascular:     Rate and Rhythm: Tachycardia present. Rhythm irregular.     Heart sounds: No murmur.  Pulmonary:     Effort: Pulmonary effort is normal. No respiratory distress.     Breath sounds:  No stridor. Rhonchi (Upper airways bilaterally) present.  Abdominal:     General: Bowel sounds are normal. There is no distension.     Palpations: Abdomen is soft.  Musculoskeletal:        General: No swelling or tenderness.     Right lower leg: No edema.     Left lower leg: No edema.  Skin:    General: Skin is warm.     Capillary Refill: Capillary refill takes less than 2 seconds.  Neurological:     General: No focal deficit present.     Mental Status: He is alert and oriented to person, place, and time.  Psychiatric:        Mood and Affect: Mood normal.    EKG: personally reviewed my interpretation is atrial fibrillation with rapid ventricular rate  CXR: personally reviewed my interpretation is no focal consolidations, body habitus decreases penetration, no blunting of the costophrenic angles  Assessment & Plan by Problem: Active Problems:   Atrial fibrillation with RVR (HCC)  Assessment:  Plan: Atrial fibrillation with RVR: The patient presented with a ventricular rate greater than 200.  He has a previous diagnosis of atrial fibrillation likely secondary to untreated OSA with no other clear etiology to notify.  TEE January 2020 revealed mitral regurgitation but no overt chamber dilation.  Cardioversion in July 2020 failed.  Cardiology was to reattempt this due to COVID-19 crisis elective procedure was put off.  He has failed to maintain adherence to the recommended regimen of diltiazem and metoprolol and was unable to afford the Xarelto.  He presents today in extreme rapid ventricular rate likely due to medication nonadherence ingestion of a large quantity of marijuana with subsequent anxiety agitation.Likely consideration for repeat cardioversion with TEE as an outpatient after sufficient anticoagulation if he remains stable. - Continue heparin IV per pharmacy - Continue diltiazem gtt. to maintain rate less than 110 - Cardiology has been consulted, we appreciate their  recommendations - We will transition for potassium GTT to oral rate stabilized -Magnesium 1.9, given tachycardia will replete to 2.0 -Repeat mag in a.m. - We repleted potassium with 80 mEq p.o. and 10 mEq IV  Acute renal injury: Potassium 2.9, serum creatinine 1.63 up from 0.98 months prior.  Etiology definitively uncertain but patient appears volume down on exam today so I suspect this is prerenal secondary to poor oral intake in the setting of resumed Lasix consumption.  Marked proteinuria on UA with history of hematuria and pitting edema in his ankles increasing concern for nephrotic syndrome. - CMP for repeat sCr and albumin - Renal ultrasound ordered demonstrating possible nonobstructing stone in the right kidney - BMP in a.m. - Status post bolus - Continue fluids 113ml/hr - Ordered protein/creatinine and albumin/creatinine urinalysis  Fever: Temperature minimally  elevated to 100.9 on admission. No respiratory symptoms.  Although early COVID infection is certainly always possible with febrile patients it is unlikely given his current presentation.  Most likely due to the patient's agitation, severe tachycardia all as a result of his ingestion of a high-dose of marijuana in a marijuana nave patient.  Aside from a mildly elevated lactate of 2.3 and leukocytosis of 12.7 which I suspect primarily due to hemoconcentration from dehydration given his AKI I do not feel this is an overt infection but agree with monitoring.  Platelets stable. Patient denied urinary symptoms.  -Blood cultures ordered and pending -Lactate initially 2.3 -Repeat lactate pending -Continue with maintenance fluids -COVID ordered  Elevated glucose: Glucose of 198 on BMP today.  Patient has had an occasional elevated glucose to 109.  He is not currently in a fasting state but regardless of this he is likely at least prediabetic based on this finding. -Order A1c -SSI sensitive  Hemoglobinuria: Persistent despite  discontinuation of his Johnella Moloney. He was to follow with Urology for this but has not been seen to date. He will need to make certain this is followed up given his history of testicular cancer. Consider CT renal study if renal function improves as this is  possibly nephrolithiasis.  OSA: Will need a outpatient evaluation for consideration of a sleep study. He had lost ~70lbs and did not feel this was a prominent issue anymore.   Diet: Carb modified once seen by cardiology Code: Full DVT prophylaxis: Full dose heparin Dispo: Admit patient to Inpatient with expected length of stay greater than 2 midnights.  Signed: Kathi Ludwig, MD 08/29/2019, 8:16 AM  Pager: # [phone]

## 2019-08-30 DIAGNOSIS — T407X1A Poisoning by cannabis (derivatives), accidental (unintentional), initial encounter: Secondary | ICD-10-CM

## 2019-08-30 DIAGNOSIS — R509 Fever, unspecified: Secondary | ICD-10-CM

## 2019-08-30 DIAGNOSIS — Z9114 Patient's other noncompliance with medication regimen: Secondary | ICD-10-CM

## 2019-08-30 DIAGNOSIS — G4733 Obstructive sleep apnea (adult) (pediatric): Secondary | ICD-10-CM

## 2019-08-30 DIAGNOSIS — C6290 Malignant neoplasm of unspecified testis, unspecified whether descended or undescended: Secondary | ICD-10-CM

## 2019-08-30 DIAGNOSIS — Z91010 Allergy to peanuts: Secondary | ICD-10-CM

## 2019-08-30 DIAGNOSIS — N179 Acute kidney failure, unspecified: Secondary | ICD-10-CM

## 2019-08-30 DIAGNOSIS — N200 Calculus of kidney: Secondary | ICD-10-CM

## 2019-08-30 DIAGNOSIS — T40711A Poisoning by cannabis, accidental (unintentional), initial encounter: Secondary | ICD-10-CM

## 2019-08-30 DIAGNOSIS — R7303 Prediabetes: Secondary | ICD-10-CM

## 2019-08-30 DIAGNOSIS — F12922 Cannabis use, unspecified with intoxication with perceptual disturbance: Secondary | ICD-10-CM

## 2019-08-30 DIAGNOSIS — R823 Hemoglobinuria: Secondary | ICD-10-CM

## 2019-08-30 DIAGNOSIS — D751 Secondary polycythemia: Secondary | ICD-10-CM

## 2019-08-30 DIAGNOSIS — I10 Essential (primary) hypertension: Secondary | ICD-10-CM

## 2019-08-30 LAB — BASIC METABOLIC PANEL
Anion gap: 9 (ref 5–15)
BUN: 14 mg/dL (ref 6–20)
CO2: 25 mmol/L (ref 22–32)
Calcium: 8.5 mg/dL — ABNORMAL LOW (ref 8.9–10.3)
Chloride: 106 mmol/L (ref 98–111)
Creatinine, Ser: 1.16 mg/dL (ref 0.61–1.24)
GFR calc Af Amer: >60 mL/min (ref >60)
GFR calc non Af Amer: >60 mL/min (ref >60)
Glucose, Bld: 90 mg/dL (ref 70–99)
Potassium: 4 mmol/L (ref 3.5–5.1)
Sodium: 140 mmol/L (ref 135–145)

## 2019-08-30 LAB — CBC
HCT: 49.7 % (ref 39.0–52.0)
Hemoglobin: 16.5 g/dL (ref 13.0–17.0)
MCH: 29.7 pg (ref 26.0–34.0)
MCHC: 33.2 g/dL (ref 30.0–36.0)
MCV: 89.4 fL (ref 80.0–100.0)
Platelets: 202 10*3/uL (ref 150–400)
RBC: 5.56 MIL/uL (ref 4.22–5.81)
RDW: 14.9 % (ref 11.5–15.5)
WBC: 8.1 10*3/uL (ref 4.0–10.5)
nRBC: 0 % (ref 0.0–0.2)

## 2019-08-30 LAB — GLUCOSE, CAPILLARY
Glucose-Capillary: 97 mg/dL (ref 70–99)
Glucose-Capillary: 131 mg/dL — ABNORMAL HIGH (ref 70–99)

## 2019-08-30 LAB — MAGNESIUM: Magnesium: 2.2 mg/dL (ref 1.7–2.4)

## 2019-08-30 MED ORDER — RIVAROXABAN 20 MG PO TABS
20.0000 mg | ORAL_TABLET | Freq: Every day | ORAL | Status: DC
  Administered 2019-08-30: 20 mg via ORAL
  Filled 2019-08-30: qty 1

## 2019-08-30 NOTE — Progress Notes (Addendum)
   Subjective: Pt states he feels well this morning. Pt denies noticing blood in his urine or having any back pain recently. Pt reports that his CPAP doesn't seem to be blowing hard enough to help him anymore. No additional complaints.  Objective:  Vital signs in last 24 hours: Vitals:   08/30/19 0445 08/30/19 0447 08/30/19 0555 08/30/19 0817  BP:  113/65 (!) 152/106 (!) 133/91  Pulse: 64 65  83  Resp:  15  14  Temp:  97.6 F (36.4 C)  98.1 F (36.7 C)  TempSrc:  Oral  Oral  SpO2:  99%  91%  Weight:      Height:       General: Well-appearing, NAD Lungs: CTAB, No r/r/w Heart: Irregularly irregular, rate controlled, S1, S2, No m/r/g Abdomen: Nontender, non-distended Neuro: Alert and oriented, no focal deficits  Assessment/Plan:  Active Problems:   Atrial fibrillation with rapid ventricular response (HCC)   Hypokalemia Summary: 48yoM with a h/o hypertension, atrial fibrillation s/p failed cardioversion in January 2020, OSA and testicular cancer presenting with AMS 2/2 to 3g of edible marijuana and atrial fibrillation w/ RVR. Pt had been noncompliant with his xarelto, diltiazem and metoprolol.  Atrial Fibrillation w/RVR: RVR resolved with diltiazem drip. Now on diltiazem 90mg  q6hrs. Pt had multiple sinus pauses o/n on telemetry; no sinus pauses >5 seconds. Pt did not notice sinus pauses. Dr. Ena Dawley unconcerned as no pauses >5 seconds stated no additional cardiology f/u indicated. Medication adherence encouraged. -Resume home diltiazem, metoprolol and xarelto  THC intoxication: Resolved. Pt encouraged to avoid marijuana.  AKI  Hemoglobinuria: AKI resolved. Hemoglobinuria likely 2/2 to staghorn calculus in superior pole of right kidney with mild hydronephrosis on renal stone CT. Pt has previously been encouraged to follow up with urology outpatient. -Outpatient urology follow-up  Fever: Resolved. Cause unclear.  OSA: Pt reports it has been many years since last sleep  study and his CPAP doesn't seem to be blowing hard enough any more. Pt will likely require repeat outpatient sleep study.  Prediabetes: A1C 6.1.   Diet: Carb modified Code: Full DVT prophylaxis: Rivaroxaban 20mg  daily Dispo: Discharge 10/10PM   LOS: 1 day   Flonnie Hailstone, Medical Student 08/30/2019, 10:28 AM   Attestation for Student Documentation:  I personally was present and performed or re-performed the history, physical exam and medical decision-making activities of this service and have verified that the service and findings are accurately documented in the student's note.  Martesha Niedermeier A, DO 08/30/2019, 2:36 PM

## 2019-08-30 NOTE — Progress Notes (Signed)
Patient has xarelto 30day free coupon and he knew that he needs to see internal medicine in 2wks and get medication assistance ship. Removed PIV access x 2 and received discharge instructions. Patient understood it well. Patient didn't bring any his belongings except clothes & shoes. HS [employer]
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
## 2019-08-30 NOTE — Progress Notes (Signed)
Received call from CCMD that pt had 4 pauses since 4am. The longest pause is 4.91 seconds. Pt HR dropped to 27 for a split second. I went to assess pt. Pt was awoken easily. Pt denied CP, SOB. Skin is warm and dry. HR is currently 66. BP=113/65. EKG was done. Dr. Margret Chance was made aware. Will cont to monitor.

## 2019-08-30 NOTE — Discharge Summary (Addendum)
Name: ADEDAMOLA GASPARYAN MRN: [id number] DOB: 04/17/1971 48 y.o. PCP: Kathi Ludwig, MD  Date of Admission: 08/29/2019 12:54 AM Date of Discharge:  Attending Physician: Velna Ochs, MD  Discharge Diagnosis: 1. Atrial Fibrillation w/RVR 2. THC intoxication 3. Staghorn renal calculus  Discharge Medications: Allergies as of 08/30/2019      Reactions   Peanut-containing Drug Products Nausea And Vomiting   Pt reports tolerance to small amount of peanut now      Medication List    STOP taking these medications   furosemide 40 MG tablet Commonly known as: LASIX     TAKE these medications   acetaminophen 325 MG tablet Commonly known as: TYLENOL Take 2 tablets (650 mg total) by mouth every 6 (six) hours as needed for mild pain.   diltiazem 360 MG 24 hr capsule Commonly known as: CARDIZEM CD Take 1 capsule (360 mg total) by mouth daily.   metoprolol tartrate 25 MG tablet Commonly known as: LOPRESSOR Take 0.5 tablets (12.5 mg total) by mouth 2 (two) times daily.   multivitamin Tabs tablet Take 1 tablet by mouth daily.   rivaroxaban 20 MG Tabs tablet Commonly known as: XARELTO Take 1 tablet (20 mg total) by mouth daily with supper for 30 days.       Disposition and follow-up:   Mr.Baruch E Gramm was discharged from [facility] in Good condition.  At the hospital follow up visit please address:  1. Afib with RVR: 2/2 eating pot brownies and not taking home metop 12.5 bid, dilt xr 360mg  qd. Also has not been taking xarelto. Resolved with dilt gtt, transitioned to home medications. Discharged with xarelto 20 mg samples. Does not have insurance.  2. Staghorn Calculi: Verify pt scheduled outpatient urology ([facility]) appointment due to staghorn calculus. Previously referred 11/2018, but missed appointment.  3. AKI: 2/2 dehydration and taking lasix. He was not taking prescribed lasix dose previously. Resolved with fluids. Assess necessity of  restarting lasix 40 mg, cardiology recommended holding at discharge 2/2 AKI and he was not taking at home.  4. Pre-diabetes Counsel pt on prediabetes.  5. OSA: Schedule pt for repeat outpatient sleep study as CPAP not working properly or settings no longer sufficient.  6. Marijuana Use: First time trying per patient. Ate half a pan of marijuana brownies. Reinforce need to avoid marijuana edibles.  2.  Labs / imaging needed at time of follow-up: CMP, sleep study  Follow-up Appointments: -[place] appointment week of 10/12 -Routine follow up with outpatient cardiologist needed -New patient appointment with [facility] needed  Hospital Course by problem list: Summary: 55yoM with a h/o hypertension, atrial fibrillation s/p failed cardioversion in January 2020, OSA and testicular cancer presenting with AMS 2/2 to 3g of edible marijuana and atrial fibrillation w/ RVR. Pt had been noncompliant with his xarelto, diltiazem and metoprolol.  Atrial Fibrillation w/RVR: Pt presented with atrial fibrillation w/RVR 2/2 to poor medication adherence and increased anxiety from marijuana edibles. RVR resolved with diltiazem drip. Pt discharged on previous home metoprolol, diltiazem and xarelto regimen per cardiology recommendation. Pt had multiple sinus pauses o/n on telemetry 10/10; no sinus pauses >5 seconds. Pt did not notice sinus pauses. Dr. Ena Dawley wasn't concerned as no pauses >5 seconds. Medication adherence encouraged on day of discharge.  THC intoxication  AMS: Pt had AMS on presentation 2/2 to 3 grams of marijuana consumed in marijuana edibles. Patient counseled to avoid marijuana in the future.  AKI  Hemoglobinuria: Pt had a mild AKI with creatinine of 1.32 on presentation. Resolved on 10/10 thought likely 2/2 to dehydration. Pt had hematuria on UA. CT renal stone protocol revealed staghorn renal calculus in right kidney.  Hemoglobinuria likely 2/2 to  staghorn calculus. Pt has previously been encouraged to follow up with urology outpatient. Scheduling outpatient urology follow-up stressed at discharge.  OSA: Pt stated during hospitalization it has been many years since last sleep study and his CPAP doesn't seem to be blowing hard enough any more. Pt will likely require repeat outpatient sleep study.  Prediabetes: A1C 6.1 during hospitalization. Pt prediabetic.  Discharge Vitals:   BP (!) 120/105 (BP Location: Right Wrist)   Pulse 86   Temp 97.8 F (36.6 C) (Oral)   Resp 17   Ht 5\' 6"  (1.676 m)   Wt 131.6 kg   SpO2 94%   BMI 46.83 kg/m   Pertinent Labs, Studies, and Procedures:  CBC Latest Ref Rng & Units 08/30/2019 08/29/2019 12/03/2018  WBC 4.0 - 10.5 K/uL 8.1 12.7(H) 6.7  Hemoglobin 13.0 - 17.0 g/dL 16.5 18.0(H) 16.9  Hematocrit 39.0 - 52.0 % 49.7 54.9(H) 53.6(H)  Platelets 150 - 400 K/uL 202 328 288   BMP Latest Ref Rng & Units 08/30/2019 08/29/2019 08/29/2019  Glucose 70 - 99 mg/dL 90 120(H) 198(H)  BUN 6 - 20 mg/dL 14 15 19   Creatinine 0.61 - 1.24 mg/dL 1.16 1.32(H) 1.63(H)  Sodium 135 - 145 mmol/L 140 137 137  Potassium 3.5 - 5.1 mmol/L 4.0 3.8 2.9(L)  Chloride 98 - 111 mmol/L 106 104 98  CO2 22 - 32 mmol/L 25 22 23   Calcium 8.9 - 10.3 mg/dL 8.5(L) 8.5(L) 9.2   Urinalysis    Component Value Date/Time   COLORURINE YELLOW 08/29/2019 0622   APPEARANCEUR HAZY (A) 08/29/2019 0622   LABSPEC 1.016 08/29/2019 0622   PHURINE 5.0 08/29/2019 0622   GLUCOSEU NEGATIVE 08/29/2019 0622   HGBUR MODERATE (A) 08/29/2019 0622   BILIRUBINUR NEGATIVE 08/29/2019 0622   KETONESUR NEGATIVE 08/29/2019 0622   PROTEINUR >=300 (A) 08/29/2019 0622   NITRITE NEGATIVE 08/29/2019 0622   LEUKOCYTESUR SMALL (A) 08/29/2019 0622   CT Renal Stone Study  IMPRESSION: 1. Partial staghorn calculus in the superior pole of the right kidney. Stone burden slightly increased from the prior study. Mild right hydronephrosis, also slightly progressed  from prior. Normal caliber of the right ureter. 2. No other acute findings in the abdomen or pelvis.  Discharge Instructions: Discharge Instructions    Amb referral to AFIB Clinic   Complete by: As directed       It was a pleasure to take care of you!   You were found to be in atrial fibrillation with rapid ventricular response. This was likely due to you not take the diltiazem and metoprolol you previously have been taking at home in addition to increased anxiety from the marijuana edibles you consumed yesterday. Cardiology believes your atrial fibrillation should remain controlled if you take the diltiazem and metoprolol you previously have been prescribed, regularly. The Xarelto is also important as it helps prevent you from forming blood clots which could lead to a stroke.   PLEASE CALL [facility] Monday TO MAKE APPOINTMENT:  [place], [place],  [id number]  [phone]  You were also found to have some blood in your urine. This is thought to be from a kidney stone that we visualized on a CT scan. You can discuss the kidney stone further with a  urologist outside the hospital.   If you develop increasing heart palpitations, shortness of breath or fatigue, please call 911 or return to your closest emergency department.   Early next week, you will be contacted about an appointment at the [facility]. The clinic is located on the ground floor of [facility].   Note these important medications:  -Continue taking metoprolol, diltiazem and Xarelto as previously prescribed  -Refrain from taking Lasix (furosemide) until following up with your cardiologist  Signed: Flonnie Hailstone, Medical Student 08/30/2019, 11:06 AM    Attestation for Student Documentation:  I personally was present and performed or re-performed the history, physical exam and medical decision-making activities of this service and have verified that the service and findings are  accurately documented in the student's note.  Virdia Ziesmer A, DO 08/30/2019, 2:42 PM

## 2019-08-30 NOTE — TOC Transition Note (Addendum)
Transition of Care [facility]) - CM/SW Discharge Note   Patient Details  Name: WALLEY MARSH MRN: [id number] Date of Birth: 1971/02/25  [facility] CM/SW Contact:  Zenon Mayo, RN Phone Number: 08/30/2019, 12:44 PM   Clinical Narrative:    Patient for dc today, he will be on xarelto, NCM gave him the 30 day free coupon for xarelto, NCM spoke with MD, she states patient will follow up with internal medicince clinic in 2 weeks and they will give him some samples also at that time and help to assist him with patient ast for xarelto. NCM will also ast with [employer] for other meds.   Final next level of care: Home/Self Care Barriers to Discharge: No Barriers Identified   Patient Goals and CMS Choice Patient states their goals for this hospitalization and ongoing recovery are:: get better CMS Medicare.gov Compare Post Acute Care list provided to:: Patient Choice offered to / list presented to : NA  Discharge Placement                       Discharge Plan and Services                          HH Arranged: NA          Social Determinants of Health (SDOH) Interventions     Readmission Risk Interventions No flowsheet data found.

## 2019-08-31 LAB — MICROALBUMIN / CREATININE URINE RATIO
Creatinine, Urine: 146.4 mg/dL
Microalb Creat Ratio: 2249 mg/g creat — ABNORMAL HIGH (ref 0–29)
Microalb, Ur: 3292.8 ug/mL — ABNORMAL HIGH

## 2019-09-04 LAB — CULTURE, BLOOD (ROUTINE X 2)
Culture: NO GROWTH
Culture: NO GROWTH
Special Requests: ADEQUATE
Special Requests: ADEQUATE

## 2019-09-05 ENCOUNTER — Encounter: Payer: Self-pay | Admitting: Internal Medicine

## 2019-09-05 ENCOUNTER — Ambulatory Visit (INDEPENDENT_AMBULATORY_CARE_PROVIDER_SITE_OTHER): Payer: Self-pay | Admitting: Internal Medicine

## 2019-09-05 VITALS — BP 109/73 | HR 87 | Temp 97.7°F | Ht 69.0 in | Wt 294.0 lb

## 2019-09-05 DIAGNOSIS — R600 Localized edema: Secondary | ICD-10-CM | POA: Insufficient documentation

## 2019-09-05 DIAGNOSIS — Z9989 Dependence on other enabling machines and devices: Secondary | ICD-10-CM

## 2019-09-05 DIAGNOSIS — T407X1A Poisoning by cannabis (derivatives), accidental (unintentional), initial encounter: Secondary | ICD-10-CM

## 2019-09-05 DIAGNOSIS — I4819 Other persistent atrial fibrillation: Secondary | ICD-10-CM

## 2019-09-05 DIAGNOSIS — R319 Hematuria, unspecified: Secondary | ICD-10-CM

## 2019-09-05 DIAGNOSIS — R4182 Altered mental status, unspecified: Secondary | ICD-10-CM

## 2019-09-05 DIAGNOSIS — Z87442 Personal history of urinary calculi: Secondary | ICD-10-CM

## 2019-09-05 DIAGNOSIS — I5030 Unspecified diastolic (congestive) heart failure: Secondary | ICD-10-CM

## 2019-09-05 DIAGNOSIS — G4733 Obstructive sleep apnea (adult) (pediatric): Secondary | ICD-10-CM

## 2019-09-05 DIAGNOSIS — Z7901 Long term (current) use of anticoagulants: Secondary | ICD-10-CM

## 2019-09-05 DIAGNOSIS — I4891 Unspecified atrial fibrillation: Secondary | ICD-10-CM

## 2019-09-05 DIAGNOSIS — I4811 Longstanding persistent atrial fibrillation: Secondary | ICD-10-CM

## 2019-09-05 DIAGNOSIS — T40711A Poisoning by cannabis, accidental (unintentional), initial encounter: Secondary | ICD-10-CM

## 2019-09-05 DIAGNOSIS — N200 Calculus of kidney: Secondary | ICD-10-CM | POA: Insufficient documentation

## 2019-09-05 DIAGNOSIS — T407X1D Poisoning by cannabis (derivatives), accidental (unintentional), subsequent encounter: Secondary | ICD-10-CM

## 2019-09-05 DIAGNOSIS — N179 Acute kidney failure, unspecified: Secondary | ICD-10-CM

## 2019-09-05 DIAGNOSIS — Z79899 Other long term (current) drug therapy: Secondary | ICD-10-CM

## 2019-09-05 MED ORDER — METOPROLOL TARTRATE 25 MG PO TABS: 12.5000 mg | ORAL_TABLET | Freq: Two times a day (BID) | ORAL | 3 refills | Status: DC

## 2019-09-05 MED ORDER — RIVAROXABAN 20 MG PO TABS: 20.0000 mg | ORAL_TABLET | Freq: Every day | ORAL | 6 refills | Status: DC

## 2019-09-05 NOTE — Assessment & Plan Note (Signed)
Patient noted to have AKI during recent admission believed to be due to dehydration possibly related to lasix use that improved with IVF, will recheck CMP today to ensure renal function remains stable. - CMP - Reduce Lasix use to PRN (LE edema)

## 2019-09-05 NOTE — Assessment & Plan Note (Addendum)
Patient recently admitted with A-fib with RVR believed to be due to intermittent medication adherence. His rate improved on IV therapy and he was transitioned to his home metoprolol and diltiazem with good response. He was also restarted on his home Xarelto.  He states he has only been taking his diltiazem since discharge. He was encourage to restart metoprolol as well. He did not start his Xarelto due to hematuria he had in the hospital. He was instructed to try taking Xarelto again, but if significant hematuria returns he can stop Xarelto until seen by urology for known renal stones. This should be safe given his CHADS2-VASc score is 0-1 depending on if you count his diastolic heart failure. He is rate controlled today with irr irr rhythm. - Diltiazem 360mg  Daily - Metoprolol 12.86mf BID - Xarelto 20mg  Daily (Can stop if significant hematuria recurs)

## 2019-09-05 NOTE — Progress Notes (Signed)
   CC: Atrial Fibrillation, OSA, AKI, Hematuria, Hospital Follow up    HPI:  [name] is a 48 y.o. M with PMHx listed below presenting for Atrial Fibrillation, OSA, AKI, Hematuria, Hospital Follow up. Please see the A&P for the status of the patient's chronic medical problems.  Past Medical History:  Diagnosis Date  . Chronic diastolic (congestive) heart failure ([place])    a. dx 11/2018 in context of afib.  . Gout   . Marijuana use   . Mitral regurgitation   . Morbid obesity ([place])   . Persistent atrial fibrillation ([place]) 11/2018   NEW  . Sleep apnea    USES CPAP  . Testicular cancer ([place])   . Tricuspid regurgitation    Review of Systems:  Performed and all others negative.  Physical Exam:  There were no vitals filed for this visit. Physical Exam Constitutional:      General: He is not in acute distress.    Appearance: Normal appearance. He is obese.  Cardiovascular:     Rate and Rhythm: Normal rate. Rhythm irregularly irregular.     Pulses: Normal pulses.     Heart sounds: Normal heart sounds.  Pulmonary:     Effort: Pulmonary effort is normal. No respiratory distress.     Breath sounds: Normal breath sounds.  Abdominal:     General: Bowel sounds are normal. There is no distension.     Palpations: Abdomen is soft.     Tenderness: There is no abdominal tenderness.  Musculoskeletal:        General: No swelling or deformity.  Skin:    General: Skin is warm and dry.  Neurological:     General: No focal deficit present.     Mental Status: Mental status is at baseline.    Assessment & Plan:   See Encounters Tab for problem based charting.  Patient discussed with Dr. Evette Doffing

## 2019-09-05 NOTE — Assessment & Plan Note (Signed)
Patient has a history of OSA on CPAP. He states his machine does not appear to have been working as effectively recently. He will need a new sleep study to see if he needs new setting.  - Split night sleep study

## 2019-09-05 NOTE — Assessment & Plan Note (Signed)
Recent hematuria noted in the setting of Xarelto use and multiple right renal stones, including partial staghorn calculi. Referral to urology placed - Referral to urology

## 2019-09-05 NOTE — Assessment & Plan Note (Signed)
History of LE edema on Lasix in the past. Due to recent AKI and no fluid accumulation off of lasix will have him take this PRN if at all. - Continue to monitor - Lasix PRN, Edema

## 2019-09-05 NOTE — Assessment & Plan Note (Signed)
Patient has history of renal stones, demonstrated on recent CT. Referral to Urology place given size and stone burden. He has had some hematuria on Xarelto for atrial fibrillation. - Urology referral.

## 2019-09-05 NOTE — Progress Notes (Signed)
Internal Medicine Clinic Attending  Case discussed with Dr. Melvin  at the time of the visit.  We reviewed the resident's history and exam and pertinent patient test results.  I agree with the assessment, diagnosis, and plan of care documented in the resident's note.  

## 2019-09-05 NOTE — Assessment & Plan Note (Signed)
Patient with some AMS during recent admission due to eat a large amount of marijuana edibles. He state he will not be having edibles again.

## 2019-09-05 NOTE — Patient Instructions (Addendum)
Thank you for allowing [place] to care for you  For your Atrial Fibrillation - Continue to take Metoprolol and Diltiazem Daily - Continue Xarelto - Please call the A-Fib clinic and schedule an appointment  Four your kidney stones and blood in your urine - Referral to urology - Continue Xarelto, you can stop if bloody urine returns - We will check your blood counts today  For your sleep apnea - We have ordered a new sleep study to determine any change in CPAP settings  For your recent kidney injury - Rechecking labs today - Only take Lasix as needed for LE swelling  Follow up with PCP, Dr Berline Lopes, on Nov 17.

## 2019-09-06 LAB — CMP14 + ANION GAP
ALT: 31 IU/L (ref 0–44)
AST: 29 IU/L (ref 0–40)
Albumin/Globulin Ratio: 1.3 (ref 1.2–2.2)
Albumin: 3.7 g/dL — ABNORMAL LOW (ref 4.0–5.0)
Alkaline Phosphatase: 72 IU/L (ref 39–117)
Anion Gap: 14 mmol/L (ref 10.0–18.0)
BUN/Creatinine Ratio: 19 (ref 9–20)
BUN: 20 mg/dL (ref 6–24)
Bilirubin Total: <0.2 mg/dL (ref 0.0–1.2)
CO2: 22 mmol/L (ref 20–29)
Calcium: 9.2 mg/dL (ref 8.7–10.2)
Chloride: 101 mmol/L (ref 96–106)
Creatinine, Ser: 1.06 mg/dL (ref 0.76–1.27)
GFR calc Af Amer: 94 mL/min/{1.73_m2} (ref >59)
GFR calc non Af Amer: 81 mL/min/{1.73_m2} (ref >59)
Globulin, Total: 2.9 g/dL (ref 1.5–4.5)
Glucose: 94 mg/dL (ref 65–99)
Potassium: 4.5 mmol/L (ref 3.5–5.2)
Sodium: 137 mmol/L (ref 134–144)
Total Protein: 6.6 g/dL (ref 6.0–8.5)

## 2019-09-06 LAB — CBC
Hematocrit: 52.5 % — ABNORMAL HIGH (ref 37.5–51.0)
Hemoglobin: 17.5 g/dL (ref 13.0–17.7)
MCH: 29.2 pg (ref 26.6–33.0)
MCHC: 33.3 g/dL (ref 31.5–35.7)
MCV: 88 fL (ref 79–97)
Platelets: 247 10*3/uL (ref 150–450)
RBC: 6 x10E6/uL — ABNORMAL HIGH (ref 4.14–5.80)
RDW: 14.7 % (ref 11.6–15.4)
WBC: 7.9 10*3/uL (ref 3.4–10.8)

## 2019-09-08 ENCOUNTER — Encounter: Payer: Self-pay | Admitting: Internal Medicine

## 2019-09-08 NOTE — Progress Notes (Signed)
Patient mail letter with normal/stable lab results

## 2019-09-12 ENCOUNTER — Encounter (HOSPITAL_COMMUNITY): Payer: Self-pay | Admitting: Nurse Practitioner

## 2019-09-30 ENCOUNTER — Other Ambulatory Visit (HOSPITAL_COMMUNITY): Payer: Self-pay | Admitting: *Deleted

## 2019-09-30 MED ORDER — FUROSEMIDE 40 MG PO TABS: 40.0000 mg | ORAL_TABLET | Freq: Every day | ORAL | 0 refills | Status: DC

## 2019-10-07 ENCOUNTER — Encounter: Payer: Self-pay | Admitting: Internal Medicine

## 2019-10-10 ENCOUNTER — Ambulatory Visit (HOSPITAL_COMMUNITY): Payer: Self-pay | Admitting: Nurse Practitioner

## 2019-11-08 ENCOUNTER — Other Ambulatory Visit (HOSPITAL_COMMUNITY): Payer: Self-pay | Admitting: Nurse Practitioner

## 2019-11-17 NOTE — Telephone Encounter (Signed)
Refill refused. He Has been transitioned to as needed lasix after recent AKI and this appears to be an automated request. He has not follow up since his last visit to discuss frequency of use. If patient request refill for leg swelling due to running out of medication, then refill would be appropriate. I will defer further refills to his PCP.

## 2020-03-05 ENCOUNTER — Other Ambulatory Visit (HOSPITAL_COMMUNITY): Payer: Self-pay | Admitting: Nurse Practitioner

## 2020-03-22 ENCOUNTER — Encounter: Payer: Self-pay | Admitting: *Deleted

## 2020-04-14 ENCOUNTER — Other Ambulatory Visit (HOSPITAL_COMMUNITY): Payer: Self-pay | Admitting: Nurse Practitioner

## 2020-05-26 ENCOUNTER — Other Ambulatory Visit (HOSPITAL_COMMUNITY): Payer: Self-pay | Admitting: Nurse Practitioner

## 2020-05-26 ENCOUNTER — Other Ambulatory Visit (HOSPITAL_COMMUNITY): Payer: Self-pay | Admitting: *Deleted

## 2020-05-26 MED ORDER — FUROSEMIDE 40 MG PO TABS: 40.0000 mg | ORAL_TABLET | Freq: Two times a day (BID) | ORAL | 0 refills | Status: DC

## 2020-06-25 ENCOUNTER — Telehealth: Payer: Self-pay | Admitting: *Deleted

## 2020-06-25 ENCOUNTER — Encounter: Payer: Self-pay | Admitting: Internal Medicine

## 2020-06-25 NOTE — Telephone Encounter (Signed)
Call from pt c/o cough x 2-3 weeks. Denies fever,loss of taste,chills, sore throat. states he does become sob when walking up stairs, walking distances. He has tried cough medication. Mucous is clear, white. He has not had covid vaccines. Appt scheduled on Monday 8/9 @ 0945 AM. Pt aware to go to UC/ED if symptoms become worse. Send to Attend - is plan appropriate?

## 2020-06-25 NOTE — Telephone Encounter (Signed)
I agree. If he does get worsening SOB or chest pain he should be seen urgently in ED/UC

## 2020-06-28 ENCOUNTER — Ambulatory Visit (HOSPITAL_COMMUNITY)
Admission: RE | Admit: 2020-06-28 | Discharge: 2020-06-28 | Disposition: A | Discharge status: Home / Self Care | Payer: Self-pay | Source: Ambulatory Visit | Attending: Internal Medicine | Admitting: Internal Medicine

## 2020-06-28 ENCOUNTER — Ambulatory Visit (INDEPENDENT_AMBULATORY_CARE_PROVIDER_SITE_OTHER): Payer: Self-pay | Admitting: Internal Medicine

## 2020-06-28 ENCOUNTER — Encounter: Payer: Self-pay | Admitting: Internal Medicine

## 2020-06-28 ENCOUNTER — Other Ambulatory Visit: Payer: Self-pay

## 2020-06-28 VITALS — BP 147/57 | HR 72 | Temp 98.1°F | Ht 69.0 in | Wt 283.1 lb

## 2020-06-28 DIAGNOSIS — R053 Chronic cough: Secondary | ICD-10-CM

## 2020-06-28 DIAGNOSIS — Z131 Encounter for screening for diabetes mellitus: Secondary | ICD-10-CM

## 2020-06-28 DIAGNOSIS — I480 Paroxysmal atrial fibrillation: Secondary | ICD-10-CM

## 2020-06-28 DIAGNOSIS — R059 Cough, unspecified: Secondary | ICD-10-CM | POA: Insufficient documentation

## 2020-06-28 DIAGNOSIS — R05 Cough: Secondary | ICD-10-CM | POA: Insufficient documentation

## 2020-06-28 LAB — POCT GLYCOSYLATED HEMOGLOBIN (HGB A1C): Hemoglobin A1C: 5.6 % (ref 4.0–5.6)

## 2020-06-28 LAB — GLUCOSE, CAPILLARY: Glucose-Capillary: 97 mg/dL (ref 70–99)

## 2020-06-28 LAB — BRAIN NATRIURETIC PEPTIDE: B Natriuretic Peptide: 135.9 pg/mL — ABNORMAL HIGH (ref 0.0–100.0)

## 2020-06-28 MED ORDER — RIVAROXABAN 20 MG PO TABS: 20.0000 mg | ORAL_TABLET | Freq: Every day | ORAL | 11 refills | Status: DC

## 2020-06-28 MED ORDER — DILTIAZEM HCL ER COATED BEADS 360 MG PO CP24: 360.0000 mg | ORAL_CAPSULE | Freq: Every day | ORAL | 11 refills | Status: DC

## 2020-06-28 MED ORDER — METOPROLOL TARTRATE 25 MG PO TABS: 25.0000 mg | ORAL_TABLET | Freq: Two times a day (BID) | ORAL | 11 refills | Status: DC

## 2020-06-28 NOTE — Assessment & Plan Note (Addendum)
Patient reports he has not been any of his medication for Afib ( Xarelto, Diltiazem, or Metoprolol). He stopped his xarelto due to hematuria, but he never followed up with urology. [name] restarting all of his medications today. He  Has an appointment with the Atrial Fibrillation Clinic tomorrow. He is on Lasix 40 mg twice daily and has has had worsening of his LE edema this week. This may be due to his atrial fibrillation. On my exam his pulse is irregulary irregular with a normal rate. Cardioversion was attempted January 2020.   Assessment: Long standing persistent atrial fibrillation  Plan: - Follow up with Atrial fibrillation clinic  - BMP8+Anion Gap - Magnesium - Phosphorus - Brain natriuretic peptide - Diltiazem 360 - Metoprolol 12.5 mg BID -Xarelto 20 mg daily ( given instruction to call if he has hematuria and I will send him to urologist) - Increase Lasix to 80 mg BID for 2 days, record weight and given instructions to call if gaining more than 3 pounds in a day of 5 pounds in a week.

## 2020-06-28 NOTE — Patient Instructions (Signed)
Thank you for trusting me with your care. To recap, today we discussed the following:   1. Paroxysmal atrial fibrillation ([place]) Follow up with Atrial fibrillation clinic tomorrow. Take Lasix 80 mg twice a day for 2 days. Then go back to your normal dosing. If you gain more than 3 pounds in one day or 5 pounds in 2 days call the clinic. I will follow up with your lab results. - BMP8+Anion Gap - POC Hbg A1C - Magnesium - Phosphorus - Brain natriuretic peptide  2. Chronic cough - DG Chest 2 View; Future  My best,  Tamsen Snider, MD

## 2020-06-28 NOTE — Progress Notes (Signed)
Internal Medicine Clinic Attending  Case discussed with Dr. Steen  At the time of the visit.  We reviewed the resident's history and exam and pertinent patient test results.  I agree with the assessment, diagnosis, and plan of care documented in the resident's note.  

## 2020-06-28 NOTE — Assessment & Plan Note (Signed)
Patient has had a cough for 2 months. Sometimes the cough is productive and sometimes it is not. He denies history of allergies and denies rhinorrhea. He denies a history of GERD. He is obese and does belch sometimes. No history of childhood asthma. Never smoker   A/P: I believe the cough could be related with increase fluid he has collected recently. We will obtain a chest xray to better evaluate his lungs. If patient does not find improvement with diuresis and chest xray is clear we may consider treating him for GERD. Plan: - Chest xray 2 view

## 2020-06-28 NOTE — Progress Notes (Signed)
   CC: Atrial fibrillation and cough for 2 months  HPI:[name] is a 49 y.o. male who presents for evaluation of atrial fibrillation and a chronic cough. Please see individual problem based A/P for details.   Past Medical History:  Diagnosis Date  . Accidental marijuana overdose, initial encounter   . Atrial fibrillation with rapid ventricular response ([place]) 08/29/2019  . Chronic diastolic (congestive) heart failure ([place])    a. dx 11/2018 in context of afib.  . Gout   . Marijuana use   . Mitral regurgitation   . Morbid obesity ([place])   . Persistent atrial fibrillation ([place]) 11/2018   NEW  . Sleep apnea    USES CPAP  . Testicular cancer ([place])   . Tricuspid regurgitation    Review of Systems:   ROS negative except as mentioned in individual problem based A/P.    Physical Exam: Vitals:   06/28/20 0951  BP: (!) 147/57  Pulse: 72  Temp: 98.1 F (36.7 C)  TempSrc: Oral  SpO2: 97%  Weight: 283 lb 1.6 oz (128.4 kg)  Height: 5\' 9"  (1.753 m)    General: NAD, obese HE: Normocephalic, atraumatic , EOMI, Conjunctivae normal ENT: No congestion, no rhinorrhea, no exudate or erythema  Cardiovascular: Irregularly irregular pulse No murmurs. No jvd Pulmonary : Effort normal, breath sounds normal. No wheezes, rales, or rhonchi Abdominal: Large abdominal scar, distended, soft, non tender, no fluid wave Musculoskeletal: LE edema 1+ , no deformities   Assessment & Plan:   See Encounters Tab for problem based charting.  Patient discussed with Dr. Angelia Mould

## 2020-06-29 ENCOUNTER — Telehealth: Payer: Self-pay | Admitting: Internal Medicine

## 2020-06-29 ENCOUNTER — Encounter (HOSPITAL_COMMUNITY): Payer: Self-pay | Admitting: Nurse Practitioner

## 2020-06-29 ENCOUNTER — Ambulatory Visit (HOSPITAL_COMMUNITY)
Admission: RE | Admit: 2020-06-29 | Discharge: 2020-06-29 | Disposition: A | Discharge status: Home / Self Care | Payer: Self-pay | Source: Ambulatory Visit | Attending: Nurse Practitioner | Admitting: Nurse Practitioner

## 2020-06-29 VITALS — BP 110/86 | HR 167 | Ht 69.0 in | Wt 275.0 lb

## 2020-06-29 DIAGNOSIS — Z7901 Long term (current) use of anticoagulants: Secondary | ICD-10-CM | POA: Insufficient documentation

## 2020-06-29 DIAGNOSIS — R319 Hematuria, unspecified: Secondary | ICD-10-CM | POA: Insufficient documentation

## 2020-06-29 DIAGNOSIS — Z9989 Dependence on other enabling machines and devices: Secondary | ICD-10-CM | POA: Insufficient documentation

## 2020-06-29 DIAGNOSIS — I5032 Chronic diastolic (congestive) heart failure: Secondary | ICD-10-CM | POA: Insufficient documentation

## 2020-06-29 DIAGNOSIS — D6869 Other thrombophilia: Secondary | ICD-10-CM

## 2020-06-29 DIAGNOSIS — M109 Gout, unspecified: Secondary | ICD-10-CM | POA: Insufficient documentation

## 2020-06-29 DIAGNOSIS — I340 Nonrheumatic mitral (valve) insufficiency: Secondary | ICD-10-CM | POA: Insufficient documentation

## 2020-06-29 DIAGNOSIS — G4733 Obstructive sleep apnea (adult) (pediatric): Secondary | ICD-10-CM | POA: Insufficient documentation

## 2020-06-29 DIAGNOSIS — Z8547 Personal history of malignant neoplasm of testis: Secondary | ICD-10-CM | POA: Insufficient documentation

## 2020-06-29 DIAGNOSIS — I4819 Other persistent atrial fibrillation: Secondary | ICD-10-CM

## 2020-06-29 DIAGNOSIS — N200 Calculus of kidney: Secondary | ICD-10-CM | POA: Insufficient documentation

## 2020-06-29 DIAGNOSIS — Z79899 Other long term (current) drug therapy: Secondary | ICD-10-CM | POA: Insufficient documentation

## 2020-06-29 DIAGNOSIS — I480 Paroxysmal atrial fibrillation: Secondary | ICD-10-CM

## 2020-06-29 LAB — BMP8+ANION GAP
Anion Gap: 21 mmol/L — ABNORMAL HIGH (ref 10.0–18.0)
BUN/Creatinine Ratio: 17 (ref 9–20)
BUN: 19 mg/dL (ref 6–24)
CO2: 20 mmol/L (ref 20–29)
Calcium: 9 mg/dL (ref 8.7–10.2)
Chloride: 97 mmol/L (ref 96–106)
Creatinine, Ser: 1.11 mg/dL (ref 0.76–1.27)
GFR calc Af Amer: 89 mL/min/{1.73_m2} (ref >59)
GFR calc non Af Amer: 77 mL/min/{1.73_m2} (ref >59)
Glucose: 98 mg/dL (ref 65–99)
Potassium: 4.1 mmol/L (ref 3.5–5.2)
Sodium: 138 mmol/L (ref 134–144)

## 2020-06-29 LAB — PHOSPHORUS: Phosphorus: 4.2 mg/dL — ABNORMAL HIGH (ref 2.8–4.1)

## 2020-06-29 LAB — MAGNESIUM: Magnesium: 2 mg/dL (ref 1.6–2.3)

## 2020-06-29 MED ORDER — RIVAROXABAN 20 MG PO TABS
20.0000 mg | ORAL_TABLET | Freq: Every day | ORAL | 11 refills | Status: DC
Start: 2020-06-29 — End: 2020-07-10

## 2020-06-29 MED ORDER — METOPROLOL TARTRATE 25 MG PO TABS
12.5000 mg | ORAL_TABLET | Freq: Two times a day (BID) | ORAL | 11 refills | Status: DC
Start: 2020-06-29 — End: 2020-07-10

## 2020-06-29 MED ORDER — DILTIAZEM HCL ER COATED BEADS 120 MG PO CP24: ORAL_CAPSULE | ORAL | 3 refills | Status: DC

## 2020-06-29 NOTE — Patient Instructions (Signed)
Start taking Diltiazem 120mg  Daily  Metoprolol 12.5mg  twice daily  Xarelto daily

## 2020-06-29 NOTE — Telephone Encounter (Signed)
Called patient to discuss results of chest xray. Personally reviewed and note pulmonary edema. No pleural effusions. Patient was seen at the atrial fibrillation clinic today and he said they are giving him medications. I expect additional medications for diuresis. He has follow up tomorrow and will be sent to the ED if he is not responding appropriately to therapy. Will defer further treatment to cardiology , but ask patient to follow up with [place] if needed.

## 2020-06-30 ENCOUNTER — Telehealth (HOSPITAL_COMMUNITY): Payer: Self-pay

## 2020-06-30 ENCOUNTER — Other Ambulatory Visit: Payer: Self-pay

## 2020-06-30 ENCOUNTER — Other Ambulatory Visit (HOSPITAL_COMMUNITY): Payer: Self-pay

## 2020-06-30 ENCOUNTER — Encounter (HOSPITAL_COMMUNITY): Payer: Self-pay | Admitting: Nurse Practitioner

## 2020-06-30 ENCOUNTER — Ambulatory Visit (HOSPITAL_COMMUNITY)
Admission: RE | Admit: 2020-06-30 | Discharge: 2020-06-30 | Disposition: A | Discharge status: Home / Self Care | Payer: Self-pay | Source: Ambulatory Visit | Attending: Nurse Practitioner | Admitting: Nurse Practitioner

## 2020-06-30 VITALS — BP 120/76 | HR 127 | Ht 69.0 in | Wt 275.0 lb

## 2020-06-30 DIAGNOSIS — D6869 Other thrombophilia: Secondary | ICD-10-CM

## 2020-06-30 DIAGNOSIS — J811 Chronic pulmonary edema: Secondary | ICD-10-CM | POA: Insufficient documentation

## 2020-06-30 DIAGNOSIS — Z79899 Other long term (current) drug therapy: Secondary | ICD-10-CM | POA: Insufficient documentation

## 2020-06-30 DIAGNOSIS — Z6841 Body Mass Index (BMI) 40.0 and over, adult: Secondary | ICD-10-CM | POA: Insufficient documentation

## 2020-06-30 DIAGNOSIS — I4819 Other persistent atrial fibrillation: Secondary | ICD-10-CM | POA: Insufficient documentation

## 2020-06-30 DIAGNOSIS — N200 Calculus of kidney: Secondary | ICD-10-CM

## 2020-06-30 DIAGNOSIS — I517 Cardiomegaly: Secondary | ICD-10-CM | POA: Insufficient documentation

## 2020-06-30 DIAGNOSIS — G4733 Obstructive sleep apnea (adult) (pediatric): Secondary | ICD-10-CM | POA: Insufficient documentation

## 2020-06-30 DIAGNOSIS — I5032 Chronic diastolic (congestive) heart failure: Secondary | ICD-10-CM | POA: Insufficient documentation

## 2020-06-30 DIAGNOSIS — Z7901 Long term (current) use of anticoagulants: Secondary | ICD-10-CM | POA: Insufficient documentation

## 2020-06-30 LAB — BASIC METABOLIC PANEL
Anion gap: 12 (ref 5–15)
BUN: 27 mg/dL — ABNORMAL HIGH (ref 6–20)
CO2: 29 mmol/L (ref 22–32)
Calcium: 8.7 mg/dL — ABNORMAL LOW (ref 8.9–10.3)
Chloride: 99 mmol/L (ref 98–111)
Creatinine, Ser: 1.2 mg/dL (ref 0.61–1.24)
GFR calc Af Amer: >60 mL/min (ref >60)
GFR calc non Af Amer: >60 mL/min (ref >60)
Glucose, Bld: 177 mg/dL — ABNORMAL HIGH (ref 70–99)
Potassium: 3.5 mmol/L (ref 3.5–5.1)
Sodium: 140 mmol/L (ref 135–145)

## 2020-06-30 MED ORDER — POTASSIUM CHLORIDE CRYS ER 20 MEQ PO TBCR: 20.0000 meq | EXTENDED_RELEASE_TABLET | Freq: Every day | ORAL | 3 refills | Status: DC

## 2020-06-30 MED ORDER — FUROSEMIDE 40 MG PO TABS: ORAL_TABLET | ORAL | Status: DC

## 2020-06-30 MED ORDER — DILTIAZEM HCL ER COATED BEADS 120 MG PO CP24
ORAL_CAPSULE | ORAL | Status: DC
Start: 2020-06-30 — End: 2020-07-10

## 2020-06-30 NOTE — Telephone Encounter (Signed)
Per Roderic Palau, Pt to be referred to [facility] for a kidney stone

## 2020-06-30 NOTE — Progress Notes (Signed)
Primary Care Physician: [facility] Referring Physician:  Madalyn Rob, MD    Anthony Byrd is a 49 y.o. male with a h/o obesity, untreated OSA, remote testicular cancer, that is in the Afib clinic for treatment of newly diagnosed afib. He presented to the ER, 11/22/18, with cough, shortness of breath and LLE. He was in  afib with rvr. He underwent TEE guided cardioversion which was unsuccessful. He is new start to  xarelto 20 mg daily as well as diltiazem 360 mg daily.  On last visit, pt was suppose to have f/u early February to discuss AAD or repeat ablation. He failed to show up. He was also having hematuria and was suppose to have urology f/u which did not happen, reasons unclear. He states that the hematuria has stopped. He states that he is still taking all of his meds.   I spoke to him on the phone and he feels that he is doing well. He can not tell if he still is in afib. He does not check his BP/HR at home.   F/u in afib clinic 06/30/20. He is in the afib clinic on referral of PCP, he had return to afib with RVR and fluid overload, pt thinks around 20 lbs. His PCP did increase lasix yesterday to 80 mg bid. Pt feels his legs and belly feel less swollen, He is tolerating well. He does not have any PND/orthopnea. Some exertional dyspnea. Not aware of irregular heart beat or when he went back into afib. He stopped this rate control meds in April. His CHA2DS2VASc score is 0, so he came off xarelto several months ago.   Today, he denies symptoms of palpitations, chest pain, shortness of breath, orthopnea, PND, lower extremity edema, dizziness, presyncope, syncope, or neurologic sequela. The patient is tolerating medications without difficulties and is otherwise without complaint today.   Past Medical History:  Diagnosis Date   Accidental marijuana overdose, initial encounter    Atrial fibrillation with rapid ventricular response ([place]) 08/29/2019   Chronic diastolic (congestive)  heart failure ([place])    a. dx 11/2018 in context of afib.   Gout    Marijuana use    Mitral regurgitation    Morbid obesity ([facility])    Persistent atrial fibrillation ([place]) 11/2018   NEW   Sleep apnea    USES CPAP   Testicular cancer (HCC)    Tricuspid regurgitation    Past Surgical History:  Procedure Laterality Date   CARDIOVERSION N/A 11/26/2018   Procedure: CARDIOVERSION;  Surgeon: Acie Fredrickson Wonda Cheng, MD;  Location: [place];  Service: Cardiovascular;  Laterality: N/A;   EXTERNAL FIXATION LEG Right 06/24/2016   Procedure: EXTERNAL FIXATION LEG;  Surgeon: Renette Butters, MD;  Location: [place];  Service: Orthopedics;  Laterality: Right;   EXTERNAL FIXATION REMOVAL Right 06/27/2016   Procedure: REMOVAL EXTERNAL FIXATION LEG;  Surgeon: Renette Butters, MD;  Location: [place];  Service: Orthopedics;  Laterality: Right;   ORIF ANKLE FRACTURE Left 07/03/2016   Procedure: OPEN REDUCTION INTERNAL FIXATION (ORIF) ANKLE FRACTURE; DRESSING CHANGE RIGHT LEG;  Surgeon: Renette Butters, MD;  Location: [place];  Service: Orthopedics;  Laterality: Left;   ORIF TIBIA PLATEAU Right 06/27/2016   Procedure: OPEN REDUCTION INTERNAL FIXATION (ORIF) TIBIAL PLATEAU;  Surgeon: Renette Butters, MD;  Location: [place];  Service: Orthopedics;  Laterality: Right;   SURGERY SCROTAL / TESTICULAR     TEE WITHOUT CARDIOVERSION N/A 11/26/2018   Procedure: TRANSESOPHAGEAL ECHOCARDIOGRAM (TEE);  Surgeon:  Nahser, Wonda Cheng, MD;  Location: [facility] ENDOSCOPY;  Service: Cardiovascular;  Laterality: N/A;    Current Outpatient Medications  Medication Sig Dispense Refill   furosemide (LASIX) 40 MG tablet TAKE ONE TABLET BY MOUTH TWICE A DAY 60 tablet 5   acetaminophen (TYLENOL) 325 MG tablet Take 2 tablets (650 mg total) by mouth every 6 (six) hours as needed for mild pain. (Patient not taking: Reported on 06/29/2020) 60 tablet 0   diltiazem (CARDIZEM CD) 120 MG 24 hr capsule Take 1 capsule daily 30 capsule 3   metoprolol  tartrate (LOPRESSOR) 25 MG tablet Take 0.5 tablets (12.5 mg total) by mouth 2 (two) times daily. 60 tablet 11   multivitamin (ONE-A-DAY MEN'S) TABS tablet Take 1 tablet by mouth daily. (Patient not taking: Reported on 06/29/2020)     rivaroxaban (XARELTO) 20 MG TABS tablet Take 1 tablet (20 mg total) by mouth daily with supper. 30 tablet 11   No current facility-administered medications for this encounter.    Allergies  Allergen Reactions   Peanut-Containing Drug Products Nausea And Vomiting    Pt reports tolerance to small amount of peanut now    Social History   Socioeconomic History   Marital status: Legally Separated    Spouse name: Not on file   Number of children: Not on file   Years of education: Not on file   Highest education level: Not on file  Occupational History   Not on file  Tobacco Use   Smoking status: Never Smoker   Smokeless tobacco: Never Used  Vaping Use   Vaping Use: Never used  Substance and Sexual Activity   Alcohol use: No   Drug use: Not Currently    Types: Marijuana   Sexual activity: Not on file  Other Topics Concern   Not on file  Social History Narrative   Not on file   Social Determinants of Health   Financial Resource Strain:    Difficulty of Paying Living Expenses:   Food Insecurity:    Worried About Charity fundraiser in the Last Year:    Arboriculturist in the Last Year:   Transportation Needs:    Film/video editor (Medical):    Lack of Transportation (Non-Medical):   Physical Activity:    Days of Exercise per Week:    Minutes of Exercise per Session:   Stress:    Feeling of Stress :   Social Connections:    Frequency of Communication with Friends and Family:    Frequency of Social Gatherings with Friends and Family:    Attends Religious Services:    Active Member of Clubs or Organizations:    Attends Music therapist:    Marital Status:   Intimate Partner Violence:    Fear  of Current or Ex-Partner:    Emotionally Abused:    Physically Abused:    Sexually Abused:     Family History  Problem Relation Age of Onset   CAD Neg Hx    Stroke Neg Hx     ROS- All systems are reviewed and negative except as per the HPI above  Physical Exam: Vitals:   06/29/20 1536  BP: 110/86  Pulse: (!) 167  SpO2: 96%  Weight: 124.7 kg  Height: 5\' 9"  (1.753 m)   Wt Readings from Last 3 Encounters:  06/29/20 124.7 kg  06/28/20 128.4 kg  09/05/19 133.4 kg    Labs: Lab Results  Component Value Date   NA 138  06/28/2020   K 4.1 06/28/2020   CL 97 06/28/2020   CO2 20 06/28/2020   GLUCOSE 98 06/28/2020   BUN 19 06/28/2020   CREATININE 1.11 06/28/2020   CALCIUM 9.0 06/28/2020   PHOS 4.2 (H) 06/28/2020   MG 2.0 06/28/2020   Lab Results  Component Value Date   INR 0.9 08/29/2019   No results found for: CHOL, HDL, LDLCALC, TRIG   GEN- NA, phone visit  EKG- Afib with RVR at 167 bpm Labs from  PCP drawn 8/9 reviewed  CXR- 8/9-FINDINGS: There is cardiomegaly and pulmonary edema. No pneumothorax or pleural effusion. No acute or focal bony abnormality.  IMPRESSION: Cardiomegaly and pulmonary edema.  10/9 Renal CT-Adrenals/Urinary Tract: Adrenal glands are unremarkable. Kidneys are symmetric. No definite renal lesions. Partial staghorn calculus again seen in the superior pole of the right kidney. The largest portion measures 1.7 cm in length. There are few additional calculi in the renal pelvis spanning 1.8 cm. There is mild fullness of the right renal pelvis slightly progressed from prior. No left renal calculi or hydronephrosis. Bladder is unremarkable.   Assessment and Plan: 1. Persistent  afib  Not sure when he went back into afib  [name] going to the ER vrs close management  of this as a outpatient  He appears stable  We will approach as an outpatient Start back on Cardizem 120 mg daily Restart metoprolol 12.5 mg bid   2.Weight  gain/edema  Continue  lasix 80 mg bid   3. CHA2DS2VASc score of 0 Restart  xarelto 20 mg daily as I anticipate he will need another cardioversion   4. Staghorn renal stone Has failed to f/u with urology Has hematuria when he takes DOAC Right now benefit outweighs the risk  Will refer back to urology    F/u in [place]. Sherah Lund, [facility] [address] [place], [place] [address] [phone]

## 2020-06-30 NOTE — Progress Notes (Signed)
Primary Care Physician: [facility] Referring Physician:  Madalyn Rob, MD    Anthony Byrd is a 49 y.o. male with a h/o obesity, untreated OSA, remote testicular cancer, that is in the Afib clinic for treatment of newly diagnosed afib. He presented to the ER, 11/22/18, with cough, shortness of breath and LLE. He was in  afib with rvr. He underwent TEE guided cardioversion which was unsuccessful. He is new start to  xarelto 20 mg daily as well as diltiazem 360 mg daily.  On last visit, pt was suppose to have f/u early February to discuss AAD or repeat ablation. He failed to show up. He was also having hematuria and was suppose to have urology f/u which did not happen, reasons unclear. He states that the hematuria has stopped. He states that he is still taking all of his meds.   I spoke to him on the phone and he feels that he is doing well. He can not tell if he still is in afib. He does not check his BP/HR at home.   F/u in afib clinic 06/30/20. He is in the afib clinic on referral of PCP, he had return to afib with RVR and fluid overload, pt thinks around 20 lbs. His PCP did increase lasix yesterday to 80 mg bid. Pt feels his legs and belly feel less swollen, He is tolerating well. He does not have any PND/orthopnea. Some exertional dyspnea. Not aware of irregular heart beat or when he went back into afib with RVR. He stopped this rate control meds in April. His CHA2DS2VASc score is 0, so he came off xarelto several months ago.   F/u in afib clinic 8/11. He is now back on rate control and v rate has slowed down today in the 120's. He has lost several more lbs on the lasix 80 mg bid. He still denies  PND/orthopnea. Has had a chronic cough for several weeks. He still looks stable to continue treatment as an outpatient. He started xarelto back last night.   Today, he denies symptoms of palpitations, chest pain, shortness of breath, orthopnea, PND, lower extremity edema, dizziness,  presyncope, syncope, or neurologic sequela. The patient is tolerating medications without difficulties and is otherwise without complaint today.   Past Medical History:  Diagnosis Date  . Accidental marijuana overdose, initial encounter   . Atrial fibrillation with rapid ventricular response ([place]) 08/29/2019  . Chronic diastolic (congestive) heart failure ([place])    a. dx 11/2018 in context of afib.  . Gout   . Marijuana use   . Mitral regurgitation   . Morbid obesity ([place])   . Persistent atrial fibrillation ([place]) 11/2018   NEW  . Sleep apnea    USES CPAP  . Testicular cancer ([place])   . Tricuspid regurgitation    Past Surgical History:  Procedure Laterality Date  . CARDIOVERSION N/A 11/26/2018   Procedure: CARDIOVERSION;  Surgeon: Acie Fredrickson Wonda Cheng, MD;  Location: [facility] ENDOSCOPY;  Service: Cardiovascular;  Laterality: N/A;  . EXTERNAL FIXATION LEG Right 06/24/2016   Procedure: EXTERNAL FIXATION LEG;  Surgeon: Renette Butters, MD;  Location: [place];  Service: Orthopedics;  Laterality: Right;  . EXTERNAL FIXATION REMOVAL Right 06/27/2016   Procedure: REMOVAL EXTERNAL FIXATION LEG;  Surgeon: Renette Butters, MD;  Location: [place];  Service: Orthopedics;  Laterality: Right;  . ORIF ANKLE FRACTURE Left 07/03/2016   Procedure: OPEN REDUCTION INTERNAL FIXATION (ORIF) ANKLE FRACTURE; DRESSING CHANGE RIGHT LEG;  Surgeon: Renette Butters,  MD;  Location: [place];  Service: Orthopedics;  Laterality: Left;  . ORIF TIBIA PLATEAU Right 06/27/2016   Procedure: OPEN REDUCTION INTERNAL FIXATION (ORIF) TIBIAL PLATEAU;  Surgeon: Renette Butters, MD;  Location: [place];  Service: Orthopedics;  Laterality: Right;  . SURGERY SCROTAL / TESTICULAR    . TEE WITHOUT CARDIOVERSION N/A 11/26/2018   Procedure: TRANSESOPHAGEAL ECHOCARDIOGRAM (TEE);  Surgeon: Acie Fredrickson Wonda Cheng, MD;  Location: [facility] ENDOSCOPY;  Service: Cardiovascular;  Laterality: N/A;    Current Outpatient Medications  Medication Sig Dispense Refill  . acetaminophen  (TYLENOL) 325 MG tablet Take 2 tablets (650 mg total) by mouth every 6 (six) hours as needed for mild pain. (Patient not taking: Reported on 06/29/2020) 60 tablet 0  . diltiazem (CARDIZEM CD) 120 MG 24 hr capsule Take 1 capsule daily 30 capsule 3  . furosemide (LASIX) 40 MG tablet TAKE ONE TABLET BY MOUTH TWICE A DAY 60 tablet 5  . metoprolol tartrate (LOPRESSOR) 25 MG tablet Take 0.5 tablets (12.5 mg total) by mouth 2 (two) times daily. 60 tablet 11  . multivitamin (ONE-A-DAY MEN'S) TABS tablet Take 1 tablet by mouth daily. (Patient not taking: Reported on 06/29/2020)    . rivaroxaban (XARELTO) 20 MG TABS tablet Take 1 tablet (20 mg total) by mouth daily with supper. 30 tablet 11   No current facility-administered medications for this encounter.    Allergies  Allergen Reactions  . Peanut-Containing Drug Products Nausea And Vomiting    Pt reports tolerance to small amount of peanut now    Social History   Socioeconomic History  . Marital status: Legally Separated    Spouse name: Not on file  . Number of children: Not on file  . Years of education: Not on file  . Highest education level: Not on file  Occupational History  . Not on file  Tobacco Use  . Smoking status: Never Smoker  . Smokeless tobacco: Never Used  Vaping Use  . Vaping Use: Never used  Substance and Sexual Activity  . Alcohol use: No  . Drug use: Not Currently    Types: Marijuana  . Sexual activity: Not on file  Other Topics Concern  . Not on file  Social History Narrative  . Not on file   Social Determinants of Health   Financial Resource Strain:   . Difficulty of Paying Living Expenses:   Food Insecurity:   . Worried About Charity fundraiser in the Last Year:   . Arboriculturist in the Last Year:   Transportation Needs:   . Film/video editor (Medical):   Marland Kitchen Lack of Transportation (Non-Medical):   Physical Activity:   . Days of Exercise per Week:   . Minutes of Exercise per Session:   Stress:     . Feeling of Stress :   Social Connections:   . Frequency of Communication with Friends and Family:   . Frequency of Social Gatherings with Friends and Family:   . Attends Religious Services:   . Active Member of Clubs or Organizations:   . Attends Archivist Meetings:   Marland Kitchen Marital Status:   Intimate Partner Violence:   . Fear of Current or Ex-Partner:   . Emotionally Abused:   Marland Kitchen Physically Abused:   . Sexually Abused:     Family History  Problem Relation Age of Onset  . CAD Neg Hx   . Stroke Neg Hx     ROS- All systems are reviewed and negative  except as per the HPI above  Physical Exam: There were no vitals filed for this visit. Wt Readings from Last 3 Encounters:  06/29/20 124.7 kg  06/28/20 128.4 kg  09/05/19 133.4 kg    Labs: Lab Results  Component Value Date   NA 138 06/28/2020   K 4.1 06/28/2020   CL 97 06/28/2020   CO2 20 06/28/2020   GLUCOSE 98 06/28/2020   BUN 19 06/28/2020   CREATININE 1.11 06/28/2020   CALCIUM 9.0 06/28/2020   PHOS 4.2 (H) 06/28/2020   MG 2.0 06/28/2020   Lab Results  Component Value Date   INR 0.9 08/29/2019   No results found for: CHOL, HDL, LDLCALC, TRIG   GEN- NA, phone visit  EKG- Afib with RVR at 167 bpm Labs from  PCP drawn 8/9 reviewed  CXR- 8/9-FINDINGS: There is cardiomegaly and pulmonary edema. No pneumothorax or pleural effusion. No acute or focal bony abnormality.  IMPRESSION: Cardiomegaly and pulmonary edema.  10/9 Renal CT-Adrenals/Urinary Tract: Adrenal glands are unremarkable. Kidneys are symmetric. No definite renal lesions. Partial staghorn calculus again seen in the superior pole of the right kidney. The largest portion measures 1.7 cm in length. There are few additional calculi in the renal pelvis spanning 1.8 cm. There is mild fullness of the right renal pelvis slightly progressed from prior. No left renal calculi or hydronephrosis. Bladder is unremarkable.   Assessment and  Plan: 1. Persistent  afib  Not sure when he went back into afib with RVR, as he appears to have been in afib for the majority of ekg's ran in 2020, probably had rvr when stopped rate control meds in April which has led to fluid overload  Will continue  close management  of this as a outpatient as he appears to be tolerating  Increase Cardizem 120 mg bid  Continue metoprolol 12.5 mg bid   2.Weight gain/edema  Continue  lasix 80 mg bid for now  Has lost a few more lbs bmet today Add k+ 20 meq daily   3. CHA2DS2VASc score of 0 Restart  xarelto 20 mg daily as I anticipate he will need another cardioversion   4. Staghorn renal stone Has failed to f/u with urology Has hematuria when he takes DOAC Right now benefit outweighs the risk  Will refer back to urology No blood seen as of today  Will bring back early next week  Will consider scheduling TEE guided cardioversion on f/u visit  Concerned if able to restore or maintain SR long term as I feel he has been in persistent afib for a year    F/u in afib clinic Monday   Butch Penny C. Angelia Hazell, [facility] [address] [place], [place] [address] [phone]

## 2020-07-02 ENCOUNTER — Inpatient Hospital Stay (HOSPITAL_COMMUNITY)
Admission: EM | Admit: 2020-07-02 | Discharge: 2020-07-10 | DRG: 291 | Disposition: A | Discharge status: Home / Self Care | Payer: Self-pay | Attending: Internal Medicine | Admitting: Internal Medicine

## 2020-07-02 ENCOUNTER — Inpatient Hospital Stay (HOSPITAL_COMMUNITY): Payer: Self-pay

## 2020-07-02 ENCOUNTER — Other Ambulatory Visit: Payer: Self-pay

## 2020-07-02 ENCOUNTER — Encounter (HOSPITAL_COMMUNITY): Payer: Self-pay | Admitting: Student in an Organized Health Care Education/Training Program

## 2020-07-02 ENCOUNTER — Emergency Department (HOSPITAL_COMMUNITY): Payer: Self-pay

## 2020-07-02 DIAGNOSIS — I130 Hypertensive heart and chronic kidney disease with heart failure and stage 1 through stage 4 chronic kidney disease, or unspecified chronic kidney disease: Principal | ICD-10-CM | POA: Diagnosis present

## 2020-07-02 DIAGNOSIS — I4819 Other persistent atrial fibrillation: Secondary | ICD-10-CM | POA: Diagnosis present

## 2020-07-02 DIAGNOSIS — B974 Respiratory syncytial virus as the cause of diseases classified elsewhere: Secondary | ICD-10-CM

## 2020-07-02 DIAGNOSIS — J9601 Acute respiratory failure with hypoxia: Secondary | ICD-10-CM | POA: Diagnosis present

## 2020-07-02 DIAGNOSIS — Z8547 Personal history of malignant neoplasm of testis: Secondary | ICD-10-CM

## 2020-07-02 DIAGNOSIS — Z9989 Dependence on other enabling machines and devices: Secondary | ICD-10-CM

## 2020-07-02 DIAGNOSIS — Z20822 Contact with and (suspected) exposure to covid-19: Secondary | ICD-10-CM | POA: Diagnosis present

## 2020-07-02 DIAGNOSIS — I4891 Unspecified atrial fibrillation: Secondary | ICD-10-CM

## 2020-07-02 DIAGNOSIS — Z9114 Patient's other noncompliance with medication regimen: Secondary | ICD-10-CM

## 2020-07-02 DIAGNOSIS — R059 Cough, unspecified: Secondary | ICD-10-CM

## 2020-07-02 DIAGNOSIS — R000 Tachycardia, unspecified: Secondary | ICD-10-CM

## 2020-07-02 DIAGNOSIS — I5033 Acute on chronic diastolic (congestive) heart failure: Secondary | ICD-10-CM | POA: Diagnosis present

## 2020-07-02 DIAGNOSIS — I272 Pulmonary hypertension, unspecified: Secondary | ICD-10-CM | POA: Diagnosis present

## 2020-07-02 DIAGNOSIS — E785 Hyperlipidemia, unspecified: Secondary | ICD-10-CM | POA: Diagnosis present

## 2020-07-02 DIAGNOSIS — N1831 Chronic kidney disease, stage 3a: Secondary | ICD-10-CM | POA: Diagnosis present

## 2020-07-02 DIAGNOSIS — I5031 Acute diastolic (congestive) heart failure: Secondary | ICD-10-CM

## 2020-07-02 DIAGNOSIS — Z9119 Patient's noncompliance with other medical treatment and regimen: Secondary | ICD-10-CM

## 2020-07-02 DIAGNOSIS — N179 Acute kidney failure, unspecified: Secondary | ICD-10-CM | POA: Diagnosis present

## 2020-07-02 DIAGNOSIS — Z6841 Body Mass Index (BMI) 40.0 and over, adult: Secondary | ICD-10-CM

## 2020-07-02 DIAGNOSIS — R600 Localized edema: Secondary | ICD-10-CM

## 2020-07-02 DIAGNOSIS — Z79899 Other long term (current) drug therapy: Secondary | ICD-10-CM

## 2020-07-02 DIAGNOSIS — R05 Cough: Secondary | ICD-10-CM

## 2020-07-02 DIAGNOSIS — G4733 Obstructive sleep apnea (adult) (pediatric): Secondary | ICD-10-CM | POA: Diagnosis present

## 2020-07-02 DIAGNOSIS — N3090 Cystitis, unspecified without hematuria: Secondary | ICD-10-CM | POA: Diagnosis present

## 2020-07-02 DIAGNOSIS — Z7901 Long term (current) use of anticoagulants: Secondary | ICD-10-CM

## 2020-07-02 DIAGNOSIS — R042 Hemoptysis: Secondary | ICD-10-CM

## 2020-07-02 DIAGNOSIS — I509 Heart failure, unspecified: Secondary | ICD-10-CM

## 2020-07-02 HISTORY — DX: Unspecified atrial fibrillation: I48.91

## 2020-07-02 LAB — TROPONIN I (HIGH SENSITIVITY)
Troponin I (High Sensitivity): 22 ng/L — ABNORMAL HIGH (ref <18)
Troponin I (High Sensitivity): 24 ng/L — ABNORMAL HIGH (ref <18)

## 2020-07-02 LAB — BASIC METABOLIC PANEL
Anion gap: 12 (ref 5–15)
BUN: 31 mg/dL — ABNORMAL HIGH (ref 6–20)
CO2: 25 mmol/L (ref 22–32)
Calcium: 8.8 mg/dL — ABNORMAL LOW (ref 8.9–10.3)
Chloride: 98 mmol/L (ref 98–111)
Creatinine, Ser: 1.52 mg/dL — ABNORMAL HIGH (ref 0.61–1.24)
GFR calc Af Amer: >60 mL/min (ref >60)
GFR calc non Af Amer: 52 mL/min — ABNORMAL LOW (ref >60)
Glucose, Bld: 137 mg/dL — ABNORMAL HIGH (ref 70–99)
Potassium: 4.1 mmol/L (ref 3.5–5.1)
Sodium: 135 mmol/L (ref 135–145)

## 2020-07-02 LAB — ECHOCARDIOGRAM COMPLETE
AR max vel: 1.29 cm2
AV Area VTI: 1.34 cm2
AV Area mean vel: 1.27 cm2
AV Mean grad: 7.4 mmHg
AV Peak grad: 13.2 mmHg
Ao pk vel: 1.81 m/s
Height: 69 in
MV M vel: 5.02 m/s
MV Peak grad: 100.8 mmHg
Radius: 0.8 cm
S' Lateral: 3.1 cm
Weight: 4400 oz

## 2020-07-02 LAB — CBC
HCT: 51.9 % (ref 39.0–52.0)
Hemoglobin: 16.4 g/dL (ref 13.0–17.0)
MCH: 28.3 pg (ref 26.0–34.0)
MCHC: 31.6 g/dL (ref 30.0–36.0)
MCV: 89.6 fL (ref 80.0–100.0)
Platelets: 228 10*3/uL (ref 150–400)
RBC: 5.79 MIL/uL (ref 4.22–5.81)
RDW: 15.6 % — ABNORMAL HIGH (ref 11.5–15.5)
WBC: 11.7 10*3/uL — ABNORMAL HIGH (ref 4.0–10.5)
nRBC: 0 % (ref 0.0–0.2)

## 2020-07-02 LAB — BRAIN NATRIURETIC PEPTIDE: B Natriuretic Peptide: 106.3 pg/mL — ABNORMAL HIGH (ref 0.0–100.0)

## 2020-07-02 LAB — SARS CORONAVIRUS 2 BY RT PCR (HOSPITAL ORDER, PERFORMED IN ~~LOC~~ HOSPITAL LAB): SARS Coronavirus 2: NEGATIVE

## 2020-07-02 MED ORDER — FUROSEMIDE 10 MG/ML IJ SOLN
80.0000 mg | Freq: Two times a day (BID) | INTRAMUSCULAR | Status: DC
  Administered 2020-07-02 – 2020-07-03 (×3): 80 mg via INTRAVENOUS
  Filled 2020-07-02 (×3): qty 8

## 2020-07-02 MED ORDER — POLYETHYLENE GLYCOL 3350 17 G PO PACK: 17.0000 g | PACK | Freq: Every day | ORAL | Status: DC | PRN

## 2020-07-02 MED ORDER — METOPROLOL TARTRATE 5 MG/5ML IV SOLN
2.5000 mg | Freq: Once | INTRAVENOUS | Status: AC
  Administered 2020-07-02: 2.5 mg via INTRAVENOUS
  Filled 2020-07-02: qty 5

## 2020-07-02 MED ORDER — FUROSEMIDE 10 MG/ML IJ SOLN
80.0000 mg | Freq: Once | INTRAMUSCULAR | Status: AC
  Administered 2020-07-02: 80 mg via INTRAVENOUS
  Filled 2020-07-02: qty 8

## 2020-07-02 MED ORDER — METOPROLOL TARTRATE 12.5 MG HALF TABLET
12.5000 mg | ORAL_TABLET | Freq: Two times a day (BID) | ORAL | Status: DC
  Administered 2020-07-02 (×2): 12.5 mg via ORAL
  Filled 2020-07-02 (×3): qty 1

## 2020-07-02 MED ORDER — DILTIAZEM LOAD VIA INFUSION
15.0000 mg | Freq: Once | INTRAVENOUS | Status: AC
  Administered 2020-07-02: 15 mg via INTRAVENOUS
  Filled 2020-07-02: qty 15

## 2020-07-02 MED ORDER — RIVAROXABAN 20 MG PO TABS
20.0000 mg | ORAL_TABLET | Freq: Every day | ORAL | Status: DC
  Administered 2020-07-02 – 2020-07-03 (×2): 20 mg via ORAL
  Filled 2020-07-02 (×3): qty 1

## 2020-07-02 MED ORDER — PERFLUTREN LIPID MICROSPHERE
1.0000 mL | INTRAVENOUS | Status: AC | PRN
  Administered 2020-07-02: 4 mL via INTRAVENOUS
  Filled 2020-07-02: qty 10

## 2020-07-02 MED ORDER — DILTIAZEM HCL-DEXTROSE 125-5 MG/125ML-% IV SOLN (PREMIX)
5.0000 mg/h | INTRAVENOUS | Status: DC
  Administered 2020-07-02: 5 mg/h via INTRAVENOUS
  Administered 2020-07-02 – 2020-07-03 (×2): 7.5 mg/h via INTRAVENOUS
  Administered 2020-07-03: 12.5 mg/h via INTRAVENOUS
  Administered 2020-07-04: 7.5 mg/h via INTRAVENOUS
  Filled 2020-07-02 (×4): qty 125

## 2020-07-02 NOTE — ED Triage Notes (Addendum)
Per pt he was dx the past few weeks with A-FIB and placed on medication. Pt said tonight that he was having SOB and heart palpitations. Pt also feeling SOB. Pt said no chest pain. Pt has labored breathing with sats at 92. Placed on O2 in triage

## 2020-07-02 NOTE — Progress Notes (Signed)
Placed patient on CPAP for the night via auto-mode with oxygen set at 2lpm  

## 2020-07-02 NOTE — ED Notes (Signed)
Breakfast Ordered 

## 2020-07-02 NOTE — ED Notes (Signed)
Echo at bedside

## 2020-07-02 NOTE — Consult Note (Signed)
Cardiology Consultation:   Patient ID: Anthony Byrd; [id number]; Dec 31, 1970   Admit date: 07/02/2020 Date of Consult: 07/02/2020  Primary Care Provider: Alexandria Lodge, MD Primary Cardiologist: Anthony Dresser, MD  Primary Electrophysiologist:  None  Patient Profile:   Anthony Byrd is a 49 y.o. male with a PMH of persistent atrial fibrillation, chronic diastolic CHF, mitral regurgitation, OSA on CPAP, morbid obesity, and remote testicular cancer who is being seen today for the evaluation of atrial fibrillation at the request of Anthony Byrd.  History of Present Illness:   Anthony Byrd was in his usual state of health until about 3 weeks ago when he began experiencing worsening LE edema and abdominal swelling. He was seen in the atrial fibrillation clinic 06/29/20 and 06/30/20 after having recurrent atrial fibrillation with RVR at a recent PCP visit and to evaluate weight gain and swelling. His lasix was increased to 80mg  BID 06/28/20 and he reported some improvement in symptoms. He was reported to be off his rate controlling medications and anticoagulation since April 2020. He was recommended to restart diltiazem 120mg  BID and metoprolol tartrate 12.5mg  BID for rate control and xarelto 20mg  daily for stroke ppx in anticipation of possibly needing another cardioversion. He was suggested to continue lasix 80mg  BID for volume management, however symptoms continued to decline and he subsequently developed SOB prompting him to present to the ED for further evaluation.   At the time of this evaluation he reports improvement in his breathing since presentation. He attributes his SOB to abdominal bloating pushing up into his chest. He states his LE edema and abdominal swelling have been going on for a few weeks. When asked about why he stopped his afib medications last year, he reports that he thought if he wasn't going for another cardioversion then he didn't need them. He was admitted 08/2019 with  delirium after eating a pan of pot brownies - he was seen by cardiology at that time and recommended to resume oral therapies for rate control and anticoagulation, however did not follow these instructions. Historically he is unaware of his atrial fibrillation. He denies any palpitations or racing heart beat sensations at his time or in recent weeks. There is mention of trouble with hematuria on xarelto in the past likely 2/2 kidney stones seen on imaging 08/2019, though has not followed with with urology. No complaints of hematuria since restarting xarelto a couple days ago. No complaints of melena, hematochezia, or hematemesis. He reports being off his CPAP for the past week because he couldn't get his breath while using it, though denies orthopnea or PND.     Hospital course: tachycardic with HR in the 100s-130s, intermittently tachypneic, otherwise VSS. Labs notable for K 4.1, Cr 1.52 (baseline 1.1), WBC 11.7, Hgb 16.4, PLT 228, HsTrop 22-24, BNP 106. COVID 19 negative.  CXR c/f pulmonary edema vs viral/atypical respiratory infection. EKG with atrial fibrillation with RVR, rate 124 bpm, non-specific T wave abnormalities, no STE/D. He was given IV lasix 80mg  x1 dose for management of his volume overload and IV metoprolol 2.5mg  x1 dose for rate control. Patient was admitted to medicine. Home metoprolol and xarelto continued with plans to continue IV lasix 80mg  BID. Echo pending. Cardiology asked to evaluate.   Past Medical History:  Diagnosis Date  . Accidental marijuana overdose, initial encounter   . Atrial fibrillation with rapid ventricular response ([place]) 08/29/2019  . Chronic diastolic (congestive) heart failure ([place])    a. dx 11/2018 in context of afib.  Marland Kitchen  Gout   . Marijuana use   . Mitral regurgitation   . Morbid obesity ([place])   . Persistent atrial fibrillation ([place]) 11/2018   NEW  . Sleep apnea    USES CPAP  . Testicular cancer ([place])   . Tricuspid regurgitation     Past Surgical  History:  Procedure Laterality Date  . CARDIOVERSION N/A 11/26/2018   Procedure: CARDIOVERSION;  Surgeon: Anthony Fredrickson Wonda Cheng, MD;  Location: [facility] ENDOSCOPY;  Service: Cardiovascular;  Laterality: N/A;  . EXTERNAL FIXATION LEG Right 06/24/2016   Procedure: EXTERNAL FIXATION LEG;  Surgeon: Anthony Butters, MD;  Location: [place];  Service: Orthopedics;  Laterality: Right;  . EXTERNAL FIXATION REMOVAL Right 06/27/2016   Procedure: REMOVAL EXTERNAL FIXATION LEG;  Surgeon: Anthony Butters, MD;  Location: [place];  Service: Orthopedics;  Laterality: Right;  . ORIF ANKLE FRACTURE Left 07/03/2016   Procedure: OPEN REDUCTION INTERNAL FIXATION (ORIF) ANKLE FRACTURE; DRESSING CHANGE RIGHT LEG;  Surgeon: Anthony Butters, MD;  Location: [place];  Service: Orthopedics;  Laterality: Left;  . ORIF TIBIA PLATEAU Right 06/27/2016   Procedure: OPEN REDUCTION INTERNAL FIXATION (ORIF) TIBIAL PLATEAU;  Surgeon: Anthony Butters, MD;  Location: [place];  Service: Orthopedics;  Laterality: Right;  . SURGERY SCROTAL / TESTICULAR    . TEE WITHOUT CARDIOVERSION N/A 11/26/2018   Procedure: TRANSESOPHAGEAL ECHOCARDIOGRAM (TEE);  Surgeon: Anthony Fredrickson Wonda Cheng, MD;  Location: [facility] ENDOSCOPY;  Service: Cardiovascular;  Laterality: N/A;     Home Medications:  Prior to Admission medications   Medication Sig Start Date End Date Taking? Authorizing Provider  diltiazem (CARDIZEM CD) 120 MG 24 hr capsule Take 1 capsule by mouth twice daily Patient taking differently: Take 120 mg by mouth in the morning and at bedtime. Take 1 capsule by mouth twice daily 06/30/20  Yes Anthony Needs, NP  furosemide (LASIX) 40 MG tablet Taking 2 tablets in the am and two tablets by mouth in the pm Patient taking differently: Take 80 mg by mouth 2 (two) times daily. Taking 2 tablets in the am and two tablets by mouth in the pm 06/30/20  Yes Anthony Needs, NP  metoprolol tartrate (LOPRESSOR) 25 MG tablet Take 0.5 tablets (12.5 mg total) by mouth 2 (two) times daily.  06/29/20  Yes Anthony Needs, NP  Multiple Vitamins-Minerals (MULTIVITAMIN WITH MINERALS) tablet Take 1 tablet by mouth daily.   Yes [provider]  potassium chloride SA (KLOR-CON M20) 20 MEQ tablet Take 1 tablet (20 mEq total) by mouth daily. 06/30/20  Yes Anthony Needs, NP  rivaroxaban (XARELTO) 20 MG TABS tablet Take 1 tablet (20 mg total) by mouth daily with supper. 06/29/20  Yes Anthony Needs, NP    Inpatient Medications: Scheduled Meds: . furosemide  80 mg Intravenous BID  . metoprolol tartrate  12.5 mg Oral BID  . rivaroxaban  20 mg Oral Q supper   Continuous Infusions:  PRN Meds: polyethylene glycol  Allergies:    Allergies  Allergen Reactions  . Peanut-Containing Drug Products Nausea And Vomiting    Pt reports tolerance to small amount of peanut now    Social History:   Social History   Socioeconomic History  . Marital status: Legally Separated    Spouse name: Not on file  . Number of children: Not on file  . Years of education: Not on file  . Highest education level: Not on file  Occupational History  . Not on file  Tobacco Use  . Smoking  status: Never Smoker  . Smokeless tobacco: Never Used  Vaping Use  . Vaping Use: Never used  Substance and Sexual Activity  . Alcohol use: No  . Drug use: Not Currently    Types: Marijuana  . Sexual activity: Not on file  Other Topics Concern  . Not on file  Social History Narrative  . Not on file   Social Determinants of Health   Financial Resource Strain:   . Difficulty of Paying Living Expenses:   Food Insecurity:   . Worried About Charity fundraiser in the Last Year:   . Arboriculturist in the Last Year:   Transportation Byrd:   . Film/video editor (Medical):   Marland Kitchen Lack of Transportation (Non-Medical):   Physical Activity:   . Days of Exercise per Week:   . Minutes of Exercise per Session:   Stress:   . Feeling of Stress :   Social Connections:   . Frequency of Communication with  Friends and Family:   . Frequency of Social Gatherings with Friends and Family:   . Attends Religious Services:   . Active Member of Clubs or Organizations:   . Attends Archivist Meetings:   Marland Kitchen Marital Status:   Intimate Partner Violence:   . Fear of Current or Ex-Partner:   . Emotionally Abused:   Marland Kitchen Physically Abused:   . Sexually Abused:     Family History:    Family History  Problem Relation Age of Onset  . CAD Neg Hx   . Stroke Neg Hx      ROS:  Please see the history of present illness.  ROS  All other ROS reviewed and negative.     Physical Exam/Data:   Vitals:   07/02/20 0930 07/02/20 0950 07/02/20 1016 07/02/20 1017  BP: (!) 136/91  133/80   Pulse: (!) 106 79 (!) 112 (!) 114  Resp:  19 (!) 23 (!) 22  Temp:      TempSrc:      SpO2: 94% 93%  96%  Weight:      Height:        Intake/Output Summary (Last 24 hours) at 07/02/2020 1036 Last data filed at 07/02/2020 0836 Gross per 24 hour  Intake --  Output 800 ml  Net -800 ml   Filed Weights   07/02/20 0444  Weight: 124.7 kg   Body mass index is 40.61 kg/m.  General:  Well nourished, well developed, in no acute distress HEENT: sclera anicteric  Neck: no JVD Vascular: No carotid bruits; distal pulses 2+ bilaterally Cardiac:  normal S1, S2; IRIR; no murmurs, rubs, or gallops Lungs:  Decreased breath sounds at lung bases without obvious wheezing, rhonchi, or rales  Abd: NABS, soft, obese, nontender, no hepatomegaly Ext: 2+ LE edema Musculoskeletal:  No deformities, BUE and BLE strength normal and equal Skin: warm and dry  Neuro:  CNs 2-12 intact, no focal abnormalities noted Psych:  Normal affect   EKG:  The EKG was personally reviewed and demonstrates:  atrial fibrillation with RVR, rate 124 bpm, non-specific T wave abnormalities, no STE/D. Telemetry:  Telemetry was personally reviewed and demonstrates:  Atrial fibrillation with rates in the 110s-130s.  Relevant CV Studies: - Left ventricle:  The cavity size was normal. Wall thickness was  increased in a pattern of mild LVH. Systolic function was normal.  The estimated ejection fraction was in the range of 60% to 65%.  Wall motion was normal; there were no regional  wall motion  abnormalities.  - Aortic valve: Valve mobility was restricted. There was very mild  stenosis. There was trivial regurgitation. Valve area (VTI): 2.11  cm^2. Valve area (Vmax): 1.77 cm^2. Valve area (Vmean): 1.86  cm^2.  - Mitral valve: There was moderate to severe regurgitation.  - Left atrium: The atrium was moderately dilated.   Impressions:   - Normal LV systolic function; mild LVH; calcified aortic valve  with very mild AS and trace AI; moderate to severe MR; moderate  LAE; mild TR.   Laboratory Data:  Chemistry Recent Labs  Lab 06/28/20 1047 06/30/20 1350 07/02/20 0246  NA 138 140 135  K 4.1 3.5 4.1  CL 97 99 98  CO2 20 29 25   GLUCOSE 98 177* 137*  BUN 19 27* 31*  CREATININE 1.11 1.20 1.52*  CALCIUM 9.0 8.7* 8.8*  GFRNONAA 77 >60 52*  GFRAA 89 >60 >60  ANIONGAP  --  12 12    No results for input(s): PROT, ALBUMIN, AST, ALT, ALKPHOS, BILITOT in the last 168 hours. Hematology Recent Labs  Lab 07/02/20 0246  WBC 11.7*  RBC 5.79  HGB 16.4  HCT 51.9  MCV 89.6  MCH 28.3  MCHC 31.6  RDW 15.6*  PLT 228   Cardiac EnzymesNo results for input(s): TROPONINI in the last 168 hours. No results for input(s): TROPIPOC in the last 168 hours.  BNP Recent Labs  Lab 06/28/20 1047 07/02/20 0246  BNP 135.9* 106.3*    DDimer No results for input(s): DDIMER in the last 168 hours.  Radiology/Studies:  DG Chest 2 View  Result Date: 07/02/2020 CLINICAL DATA:  49 year old male with shortness of breath, atrial fibrillation. EXAM: CHEST - 2 VIEW COMPARISON:  Chest radiographs 06/28/2020 and earlier. FINDINGS: AP and lateral views of the chest. Ongoing and progressed bilateral pulmonary interstitial opacity. Mild  cardiomegaly and mediastinal contours are stable. Visualized tracheal air column is within normal limits. No pneumothorax. No definite pleural effusion. No acute osseous abnormality identified. Numerous surgical clips partially visible in the upper abdomen. IMPRESSION: Ongoing and progressed since 06/28/2020 diffuse pulmonary interstitial opacity. Pulmonary edema is possible in the setting of atrial fibrillation, although the absence of pleural fluid would argue in favor of viral/atypical respiratory infection. Electronically Signed   By: Genevie Ann M.D.   On: 07/02/2020 03:16   DG Chest 2 View  Result Date: 06/28/2020 CLINICAL DATA:  Cough for 1 month. EXAM: CHEST - 2 VIEW COMPARISON:  Single-view of the chest 08/29/2019. FINDINGS: There is cardiomegaly and pulmonary edema. No pneumothorax or pleural effusion. No acute or focal bony abnormality. IMPRESSION: Cardiomegaly and pulmonary edema. Electronically Signed   By: Inge Rise M.D.   On: 06/28/2020 16:03    Assessment and Plan:   1. Persistent atrial fibrillation with RVR: seen in PCP office 8/9 and noted to have Afib with RVR. EKG at Afib clinic 06/29/20 showed HR in the 160s which improved to 120s on repeat the next day after resuming diltiazem 120mg  BID and metoprolol tartrate 12.5mg  BID for rate control which he had self discontinued several months prior. Additionally restarted on xarelto for stroke ppx which was also discontinued several months ago. He had a failed cardioversion 11/2018 and there was discussion of doing an ablation, however he was lost to follow-up. Echo is pending this admission - prelim read with normal LV EF - Will start diltiazem gtt for rate control - Continue metoprolol for now - Continue xarelto for stroke ppx - Could consider DCCV  after 3-4 weeks of anticoagulation should he prove compliance with medications  2. Acute on chronic diastolic CHF: he presented with SOB, LE edema, and abdominal bloating. BNP minimally  elevated to 106. CXR with pulmonary edema vs viral infection. No significant improvement in symptoms despite increasing lasix to 80mg  BID outpatient. Breathing improved some after receiving IV lasix 80 mg x1 dose. Prelim echo read with normal LV EF. He has 2+ LE edema though suspect some may be due to chronic venous insufficiency.  - F/u echo report - Can continue IV lasix 80mg  BID for now - Continue to monitor strict I&Os and daily weights - Continue to monitor electrolytes closely and replete to maintain K >4, Mg >2  3. Mitral regurgitation: mild on TEE 11/2018 (read as moderate to severe on TTE a few days prior).  - Will follow-up echo this admission  4. OSA: on CPAP outpatient though reports he was unable to use for the past week due to SOB at night.  - Continue CPAP therapy inpatient  5. AKI: Cr up to 1.5 today from 1.1 06/28/20. He is volume overloaded and in need of additional lasix - Continue to monitor closely with ongoing diuresis.   6. Morbid obesity: BMI 40 - Continue to encourage healthy dietary/lifestyle modifications to promote weight loss    For questions or updates, please contact [place] Please consult [email or web address] for contact info under Cardiology/STEMI.   Signed, Abigail Butts, PA-C  07/02/2020 10:36 AM [phone]
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
## 2020-07-02 NOTE — Progress Notes (Signed)
  Echocardiogram 2D Echocardiogram has been performed.  Anthony Byrd 07/02/2020, 11:48 AM

## 2020-07-02 NOTE — Plan of Care (Signed)
°  Problem: Education: °Goal: Knowledge of disease or condition will improve °Outcome: Progressing °Goal: Understanding of medication regimen will improve °Outcome: Progressing °  °

## 2020-07-02 NOTE — ED Notes (Signed)
Per Dr. Debara Pickett, pt is permitted to eat/drink at this time; pt given food and beverage

## 2020-07-02 NOTE — ED Notes (Signed)
Lunch tray ordered 

## 2020-07-02 NOTE — H&P (Addendum)
Date: 07/02/2020               Patient Name:  Anthony Byrd MRN: [id number]  DOB: 1971-09-23 Age / Sex: 49 y.o., male   PCP: Alexandria Lodge, MD         Medical Service: Internal Medicine Teaching Service         Attending Physician: Dr. Evette Doffing, Mallie Mussel, *      Pager:   Second Contact: Dr. Truman Hayward Pager: [phone]       After Hours (After 5p/  First Contact Pager: [phone]  weekends / holidays): Second Contact Pager: [phone]   Chief Complaint: Bilateral leg swelling  History of Present Illness:   Anthony Byrd. Couey is a 49yo male with PMH of atrial fibrillation with RVR s/p failed cardioversion 11/2018, OSA on CPAP, moderate to severe MR, staghorn calculi and remote history of testicular cancer presenting with 3 weeks of worsening bilateral lower extremity and abdominal swelling. He states that he has also had a cough, productive of white phlegm during this time with worsening shortness of breath that started this week. He believes his shortness of breath is due to the swelling in his abdomen.  He states that his SOB comes on suddenly and tends to be worse at night. He states that he sleeps laying flat with one pillow and does not notice orthopnea or PND but does note that his SOB is generally worse at night. He denies any history of COPD or asthma. He denies any known history of heart failure. He was noted to be in Afib with RVR in the emergency room with rates up to 140bpm and was given 2.5mg  Lopressor. He denies feeling any palpitations and denies CP, light-headedness, dizziness, fevers, chills, abdominal pain, hematuria, or any other symptoms. He has not been vaccinated for COVID-19 but denies any sick contacts. He states that he has increased his home Lasix dose to 80mg  BID at the request of his cardiologist and states that he has been urinating more secondary to this.   Social: Patient has never used tobacco products, does not drink alcohol, and denies any other drug use. He lives  at home with a roommate and states that he has a good support system at home.   Family History:  Family History  Problem Relation Age of Onset   CAD Neg Hx    Stroke Neg Hx    Meds:  Current Meds  Medication Sig   diltiazem (CARDIZEM CD) 120 MG 24 hr capsule Take 1 capsule by mouth twice daily (Patient taking differently: Take 120 mg by mouth in the morning and at bedtime. Take 1 capsule by mouth twice daily)   furosemide (LASIX) 40 MG tablet Taking 2 tablets in the am and two tablets by mouth in the pm (Patient taking differently: Take 80 mg by mouth 2 (two) times daily. Taking 2 tablets in the am and two tablets by mouth in the pm)   metoprolol tartrate (LOPRESSOR) 25 MG tablet Take 0.5 tablets (12.5 mg total) by mouth 2 (two) times daily.   Multiple Vitamins-Minerals (MULTIVITAMIN WITH MINERALS) tablet Take 1 tablet by mouth daily.   potassium chloride SA (KLOR-CON M20) 20 MEQ tablet Take 1 tablet (20 mEq total) by mouth daily.   rivaroxaban (XARELTO) 20 MG TABS tablet Take 1 tablet (20 mg total) by mouth daily with supper.    Allergies: Allergies as of 07/02/2020 - Review Complete 07/02/2020  Allergen Reaction Noted   Peanut-containing drug  products Nausea And Vomiting 06/23/2016   Past Medical History:  Diagnosis Date   Accidental marijuana overdose, initial encounter    Atrial fibrillation with rapid ventricular response ([place]) 08/29/2019   Chronic diastolic (congestive) heart failure ([place])    a. dx 11/2018 in context of afib.   Gout    Marijuana use    Mitral regurgitation    Morbid obesity ([facility])    Persistent atrial fibrillation ([place]) 11/2018   NEW   Sleep apnea    USES CPAP   Testicular cancer ([facility])    Tricuspid regurgitation     Review of Systems: A complete ROS was negative except as per HPI.   Physical Exam: Blood pressure (!) 128/95, pulse (!) 109, temperature 98.4 F (36.9 C), temperature source Oral, resp. rate (!) 27, height 5\' 9"  (1.753  m), weight 124.7 kg, SpO2 97 %. General: Patient is sitting comfortably in no acute distress. Eyes: Sclera non-icteric. Minimal conjunctival injection bilaterally.  HENT: No nasal discharge. Respiratory: Lung sounds are decreased at the bases, bilaterally but are otherwise CTA with no wheezes, rales, or rhonchi.  Cardiovascular: Rate is tachycardic. Rhythm is irregularly irregular. No murmurs or gallops appreciated. There is 2+ bilateral lower extremity edema. DP and PT pulses 2+ bilaterally.  Abdominal: Distended but soft and not tender to palpation. Bowel sounds intact. No rebound or guarding. There is a midline surgical scar.  Skin: Bilateral shins are erythematous and hyperpigmented without excessive warmth. Midline abdominal scar. No other lesions.  Neurological: Tremor is equal and present both at rest and with movement of the bilateral upper extremities.  Psych: Flat affect. Normal tone of voice.   EKG: personally reviewed my interpretation is atrial fibrillation with RVR at a rate of about 124bpm.   CXR: personally reviewed my interpretation is interstitial opacification likely representing vascular congestion with possible left-sided pleural effusion that appears to have progressed from previous CXR 06/28/20.  Assessment & Plan by Problem: Active Problems:   Atrial fibrillation with RVR [facility])  Anthony Byrd is a 49 y.o. M presenting with acute CHF exacerbation and hypoxic respiratory failure in the setting of Afib with RVR.   1. Afib with RVR Patient was diagnosed with Afib with RVR on similar presentation to the ED in January 2020. TEE cardioversion at that time was unsuccessful. He visited the Afib clinic after discharge and was supposed to follow up February 2020 for AAD or repeat ablation but did not show up to his appointment. He presented to the ED in Afib with RVR and rate slowed from 140's to 120's after 2.5mg  IV Lopressor. He was started back up on Cardizem 120mg  BID and metoprolol  12.5mg  BID by the Afib clinic on 06/29/20 and states he last took these medications at 7:30pm last night. - ED consulted cardiology who will see patient today - Continue Xarelto 20mg  daily - Appreciate cardiology's recommendations - Rate controlled ~ 120bpm currently - continuous telemetry  2. Acute CHF Exacerbation BNP 8/9 was 135.9. Troponin 22 > 24. Potassium 4.1. Patient had been taking Lasix 80mg  BID over the past 4 days with increased urination. He was given 80mg  IV Lasix in the ED. - Check ECHO - Check CMP, CBC w/ diff, Magnesium, lipid panel  - Continue diuresis as needed  3. Acute hypoxic respiratory failure  RF is likely multifactorial, due to CHF exacerbation in the setting of Afib with RVR. Patient also has OSA. CXR not concerning for PNA. He is satting around 96% on 2L [place].  -  Continue supplemental O2 with SpO2 goal > 92%   4. OSA on CPAP  Patient states he has a CPAP at home that he usually wears at night but states he stopped wearing his CPAP this past week as he believes it was worsening his SOB.  - CPAP at night  - Patient will likely need outpatient OSA follow up  Dispo: Admit patient to Inpatient with expected length of stay greater than 2 midnights.  Diet: NPO DVT PPx: Xarelto  Code Status: full code  Signed: Jeralyn Bennett, MD 07/02/2020, 7:57 AM  Pager: [phone]

## 2020-07-02 NOTE — ED Notes (Signed)
Lunch tray at bedside. ?

## 2020-07-02 NOTE — Progress Notes (Signed)
Subjective:  Anthony Byrd is a 49 yo male with a past medical history of Afib with RVR diagnosed 07/6788, chronic diastolic HF, OSA on CPAP, mitral regurgitation, and remote testicular cancer.  Patient seen and examined at the bedside this morning. He believes he has gained about 40 lbs in water weight. However, he does note that his SOB is improved since presentation.  Patient's O2 sat remained stable at 97% on room air while being examined. He attributes his SOB to his abdominal distention, which has worsened over the past 3 weeks. Patient also endorses a cough productive of white sputum that he has had for about 1 week as well as bilateral lower extremity swelling that has worsened over the past 3 weeks. He denies any feelings of palpitations, orthopnea, chest pain, dizziness, fever, chills, or abdominal pain currently. Overall he notes that he does feel improved since he was first seen in the ED.   Objective:  Vital signs in last 24 hours: Vitals:   07/02/20 1028 07/02/20 1058 07/02/20 1158 07/02/20 1208  BP: 138/89  (!) 121/94   Pulse: (!) 120 (!) 119 (!) 109 (!) 117  Resp: 19 (!) 22 17 (!) 21  Temp:      TempSrc:      SpO2: 97% 94% 97% 96%  Weight:      Height:       Weight change:   Intake/Output Summary (Last 24 hours) at 07/02/2020 1319 Last data filed at 07/02/2020 0836 Gross per 24 hour  Intake --  Output 800 ml  Net -800 ml   Physical Exam Constitutional:      Appearance: He is obese.  HENT:     Head: Normocephalic and atraumatic.  Eyes:     Pupils: Pupils are equal, round, and reactive to light.  Cardiovascular:     Rate and Rhythm: Tachycardia present. Rhythm irregular.     Heart sounds: No murmur heard.   Pulmonary:     Effort: Pulmonary effort is normal.     Breath sounds: Normal breath sounds. No wheezing, rhonchi or rales.  Abdominal:     General: There is distension.     Palpations: Abdomen is soft.  Musculoskeletal:     Right lower leg: Edema present.      Left lower leg: Edema present.     Comments: Bilateral 2+ edema up to the knees  Skin:    General: Skin is warm.     Findings: Erythema present.     Comments: Bilateral shins are erythematous   Neurological:     Mental Status: He is alert and oriented to person, place, and time.     Cranial Nerves: No cranial nerve deficit.     Comments: Bilateral hand tremor is noted at rest and with movement     Assessment/Plan:  Active Problems:   Atrial fibrillation with RVR (Anthony Byrd) 1. Persistent Afib with RVR Diagnosed with Afib with RVR in January of 2020 s/p failed cardioversion. There was discussion of doing an ablation but the patient was lost to follow up. On arrival to the ED, patient presented in Afib with RVR with rates in the 140s which decreased to the 120s after 2.5 mg IV Lopressor was given. At home, patient takes Cardizem 120 mg BID, metoprolol 12.5 mg BID, and Xarelto 20 mg which were started 3 days ago when he was seen in the Afib clinic. Last dose was in the evening of 8/12.  - Cardio consulted, appreciate recommendations - Diltiazem gtt for  rate control per cardio - Continuous telemetry monitoring - Continue Lopressor 12.5 mg PO BID - Continue Xarelto 20 mg tablet daily  2. Acute on chronic diastolic CHF Patient presented with LE edema and abdominal bloating. BNP level was elevated at 135.9 on 8/9, BNP today remained slightly elevated at 106.3. Troponin level also elevated on arrival at 22, followed by 24. Patient has been taking Lasix 80 mg BID at home and received 80 mg IV Lasix in the ED. - Continue diuresis with Lasix 80 mg IV BID  - Follow up TTE - Strict I&Os - Monitor CMP, CBC, Mg, and lipid panel 8/14  3. Acute hypoxic respiratory failure likely secondary to CHF exacerbation Patient presented with SOB on admission. His respiratory failure is likely secondary to his CHF exacerbation in the setting of Afib. Patient also has a history of OSA on CPAP. CXR shows diffuse  pulmonary interstitial opacity that has progressed since 8/9/1, notes that pulmonary edema is possible in the setting of Afib but no pleural fluid is seen.  - SpO2 goal >92%, currently 96% on room air - Supplemental O2 as needed  4. AKI Creatinine up to 1.52 today from 1.11 on 06/28/20. Patient appears to be volume overloaded. - Continue diuresis with Lasix 80 mg IV BID - Monitor Cr closely   5. Mitral regurgitation Mild MR noted on TEE in January of 2020.  - Follow up Echo   6. OSA  Patient has a history OSA and typically wears his CPAP at night. Patient states that he has not worn his CPAP during this past week because he believed it was contributing to his SOB. - Continue CPAP therapy    LOS: 0 days   Dorethea Clan, Medical Student 07/02/2020, 1:19 PM   Pager: [phone]

## 2020-07-03 LAB — HIV ANTIBODY (ROUTINE TESTING W REFLEX): HIV Screen 4th Generation wRfx: NONREACTIVE

## 2020-07-03 LAB — COMPREHENSIVE METABOLIC PANEL
ALT: 20 U/L (ref 0–44)
AST: 37 U/L (ref 15–41)
Albumin: 3.2 g/dL — ABNORMAL LOW (ref 3.5–5.0)
Alkaline Phosphatase: 57 U/L (ref 38–126)
Anion gap: 11 (ref 5–15)
BUN: 29 mg/dL — ABNORMAL HIGH (ref 6–20)
CO2: 28 mmol/L (ref 22–32)
Calcium: 8.5 mg/dL — ABNORMAL LOW (ref 8.9–10.3)
Chloride: 97 mmol/L — ABNORMAL LOW (ref 98–111)
Creatinine, Ser: 1.29 mg/dL — ABNORMAL HIGH (ref 0.61–1.24)
GFR calc Af Amer: >60 mL/min (ref >60)
GFR calc non Af Amer: >60 mL/min (ref >60)
Glucose, Bld: 118 mg/dL — ABNORMAL HIGH (ref 70–99)
Potassium: 3.5 mmol/L (ref 3.5–5.1)
Sodium: 136 mmol/L (ref 135–145)
Total Bilirubin: 2.5 mg/dL — ABNORMAL HIGH (ref 0.3–1.2)
Total Protein: 6.7 g/dL (ref 6.5–8.1)

## 2020-07-03 LAB — LIPID PANEL
Cholesterol: 161 mg/dL (ref 0–200)
HDL: 31 mg/dL — ABNORMAL LOW (ref >40)
LDL Cholesterol: 118 mg/dL — ABNORMAL HIGH (ref 0–99)
Total CHOL/HDL Ratio: 5.2 RATIO
Triglycerides: 61 mg/dL (ref <150)
VLDL: 12 mg/dL (ref 0–40)

## 2020-07-03 LAB — CBC WITH DIFFERENTIAL/PLATELET
Abs Immature Granulocytes: 0.05 10*3/uL (ref 0.00–0.07)
Basophils Absolute: 0.1 10*3/uL (ref 0.0–0.1)
Basophils Relative: 1 %
Eosinophils Absolute: 0.2 10*3/uL (ref 0.0–0.5)
Eosinophils Relative: 2 %
HCT: 45.9 % (ref 39.0–52.0)
Hemoglobin: 14.5 g/dL (ref 13.0–17.0)
Immature Granulocytes: 1 %
Lymphocytes Relative: 14 %
Lymphs Abs: 1.4 10*3/uL (ref 0.7–4.0)
MCH: 27.7 pg (ref 26.0–34.0)
MCHC: 31.6 g/dL (ref 30.0–36.0)
MCV: 87.6 fL (ref 80.0–100.0)
Monocytes Absolute: 1.2 10*3/uL — ABNORMAL HIGH (ref 0.1–1.0)
Monocytes Relative: 12 %
Neutro Abs: 7.4 10*3/uL (ref 1.7–7.7)
Neutrophils Relative %: 70 %
Platelets: 191 10*3/uL (ref 150–400)
RBC: 5.24 MIL/uL (ref 4.22–5.81)
RDW: 15.2 % (ref 11.5–15.5)
WBC: 10.4 10*3/uL (ref 4.0–10.5)
nRBC: 0 % (ref 0.0–0.2)

## 2020-07-03 LAB — TSH: TSH: 0.536 u[IU]/mL (ref 0.350–4.500)

## 2020-07-03 LAB — MAGNESIUM
Magnesium: 1.9 mg/dL (ref 1.7–2.4)
Magnesium: 2.2 mg/dL (ref 1.7–2.4)

## 2020-07-03 MED ORDER — GUAIFENESIN-DM 100-10 MG/5ML PO SYRP
5.0000 mL | ORAL_SOLUTION | ORAL | Status: DC | PRN
  Administered 2020-07-03 – 2020-07-08 (×3): 5 mL via ORAL
  Filled 2020-07-03 (×3): qty 5

## 2020-07-03 MED ORDER — ACETAMINOPHEN 325 MG PO TABS
650.0000 mg | ORAL_TABLET | Freq: Four times a day (QID) | ORAL | Status: DC | PRN
  Administered 2020-07-03: 650 mg via ORAL
  Filled 2020-07-03: qty 2

## 2020-07-03 MED ORDER — POTASSIUM CHLORIDE CRYS ER 20 MEQ PO TBCR
40.0000 meq | EXTENDED_RELEASE_TABLET | Freq: Two times a day (BID) | ORAL | Status: DC
  Administered 2020-07-03 – 2020-07-04 (×3): 40 meq via ORAL
  Filled 2020-07-03 (×3): qty 2

## 2020-07-03 MED ORDER — POTASSIUM CHLORIDE 20 MEQ PO PACK
40.0000 meq | PACK | Freq: Two times a day (BID) | ORAL | Status: DC
  Filled 2020-07-03: qty 2

## 2020-07-03 MED ORDER — MAGNESIUM SULFATE 2 GM/50ML IV SOLN
2.0000 g | Freq: Once | INTRAVENOUS | Status: AC
  Administered 2020-07-03: 2 g via INTRAVENOUS
  Filled 2020-07-03: qty 50

## 2020-07-03 MED ORDER — METOPROLOL TARTRATE 50 MG PO TABS
50.0000 mg | ORAL_TABLET | Freq: Four times a day (QID) | ORAL | Status: DC
  Administered 2020-07-03 – 2020-07-04 (×5): 50 mg via ORAL
  Filled 2020-07-03 (×5): qty 1

## 2020-07-03 NOTE — Progress Notes (Signed)
Cardiology Progress Note  Patient ID: Anthony Byrd MRN: [id number] DOB: Aug 08, 1971 Date of Encounter: 07/03/2020  Primary Cardiologist: Buford Dresser, MD  Subjective   Chief Complaint: Afib with RVR. +SOB.  HPI: Good urine output. Still volume overloaded. Rates not controlled.   ROS:  All other ROS reviewed and negative. Pertinent positives noted in the HPI.     Inpatient Medications  Scheduled Meds: . furosemide  80 mg Intravenous BID  . metoprolol tartrate  12.5 mg Oral BID  . potassium chloride  40 mEq Oral BID  . rivaroxaban  20 mg Oral Q supper   Continuous Infusions: . diltiazem (CARDIZEM) infusion 7.5 mg/hr (07/03/20 0257)   PRN Meds: polyethylene glycol   Vital Signs   Vitals:   07/02/20 2350 07/03/20 0302 07/03/20 0409 07/03/20 0410  BP: (!) 121/96 119/87 122/89 122/89  Pulse: (!) 112  (!) 118 (!) 118  Resp: 20  19 19   Temp: 98 F (36.7 C)  98.1 F (36.7 C) 98.1 F (36.7 C)  TempSrc: Oral  Oral   SpO2: 95%  94%   Weight:   125.2 kg   Height:        Intake/Output Summary (Last 24 hours) at 07/03/2020 0811 Last data filed at 07/03/2020 7001 Gross per 24 hour  Intake 361.88 ml  Output 3595 ml  Net -3233.12 ml   Last 3 Weights 07/03/2020 07/02/2020 07/02/2020  Weight (lbs) 276 lb 1.6 oz 272 lb 8 oz 275 lb  Weight (kg) 125.238 kg 123.605 kg 124.739 kg      Telemetry  Overnight telemetry shows Afib 100-130 bpm, which I personally reviewed.   ECG  The most recent ECG shows Afib with RVR 124 bpm, which I personally reviewed.   Physical Exam   Vitals:   07/02/20 2350 07/03/20 0302 07/03/20 0409 07/03/20 0410  BP: (!) 121/96 119/87 122/89 122/89  Pulse: (!) 112  (!) 118 (!) 118  Resp: 20  19 19   Temp: 98 F (36.7 C)  98.1 F (36.7 C) 98.1 F (36.7 C)  TempSrc: Oral  Oral   SpO2: 95%  94%   Weight:   125.2 kg   Height:         Intake/Output Summary (Last 24 hours) at 07/03/2020 0811 Last data filed at 07/03/2020 7494 Gross per 24  hour  Intake 361.88 ml  Output 3595 ml  Net -3233.12 ml    Last 3 Weights 07/03/2020 07/02/2020 07/02/2020  Weight (lbs) 276 lb 1.6 oz 272 lb 8 oz 275 lb  Weight (kg) 125.238 kg 123.605 kg 124.739 kg    Body mass index is 40.77 kg/m.   General: Well nourished, well developed, in no acute distress Head: Atraumatic, normal size  Eyes: PEERLA, EOMI  Neck: Supple,+ JVD Endocrine: No thryomegaly Cardiac: Normal S1, S2; irregular rhythm, no m/r/g Lungs: Clear to auscultation bilaterally, no wheezing, rhonchi or rales  Abd: distended abdomen +fluid wave  Ext: 2+ pitting edema  Musculoskeletal: No deformities, BUE and BLE strength normal and equal Skin: Warm and dry, no rashes   Neuro: Alert and oriented to person, place, time, and situation, CNII-XII grossly intact, no focal deficits  Psych: Normal mood and affect   Labs  High Sensitivity Troponin:   Recent Labs  Lab 07/02/20 0246 07/02/20 0439  TROPONINIHS 22* 24*     Cardiac EnzymesNo results for input(s): TROPONINI in the last 168 hours. No results for input(s): TROPIPOC in the last 168 hours.  Chemistry Recent Labs  Lab  06/30/20 1350 07/02/20 0246 07/03/20 0352  NA 140 135 136  K 3.5 4.1 3.5  CL 99 98 97*  CO2 29 25 28   GLUCOSE 177* 137* 118*  BUN 27* 31* 29*  CREATININE 1.20 1.52* 1.29*  CALCIUM 8.7* 8.8* 8.5*  PROT  --   --  6.7  ALBUMIN  --   --  3.2*  AST  --   --  37  ALT  --   --  20  ALKPHOS  --   --  57  BILITOT  --   --  2.5*  GFRNONAA >60 52* >60  GFRAA >60 >60 >60  ANIONGAP 12 12 11     Hematology Recent Labs  Lab 07/02/20 0246 07/03/20 0352  WBC 11.7* 10.4  RBC 5.79 5.24  HGB 16.4 14.5  HCT 51.9 45.9  MCV 89.6 87.6  MCH 28.3 27.7  MCHC 31.6 31.6  RDW 15.6* 15.2  PLT 228 191   BNP Recent Labs  Lab 06/28/20 1047 07/02/20 0246  BNP 135.9* 106.3*    DDimer No results for input(s): DDIMER in the last 168 hours.   Radiology  DG Chest 2 View  Result Date: 07/02/2020 CLINICAL DATA:   49 year old male with shortness of breath, atrial fibrillation. EXAM: CHEST - 2 VIEW COMPARISON:  Chest radiographs 06/28/2020 and earlier. FINDINGS: AP and lateral views of the chest. Ongoing and progressed bilateral pulmonary interstitial opacity. Mild cardiomegaly and mediastinal contours are stable. Visualized tracheal air column is within normal limits. No pneumothorax. No definite pleural effusion. No acute osseous abnormality identified. Numerous surgical clips partially visible in the upper abdomen. IMPRESSION: Ongoing and progressed since 06/28/2020 diffuse pulmonary interstitial opacity. Pulmonary edema is possible in the setting of atrial fibrillation, although the absence of pleural fluid would argue in favor of viral/atypical respiratory infection. Electronically Signed   By: Genevie Ann M.D.   On: 07/02/2020 03:16   ECHOCARDIOGRAM COMPLETE  Result Date: 07/02/2020    ECHOCARDIOGRAM REPORT   Patient Name:   Anthony Byrd Date of Exam: 07/02/2020 Medical Rec #:  [id number]      Height:       69.0 in Accession #:    [id number]     Weight:       275.0 lb Date of Birth:  Apr 11, 1971      BSA:          2.365 m Patient Age:    49 years       BP:           123/84 mmHg Patient Gender: M              HR:           123 bpm. Exam Location:  Inpatient Procedure: 2D Echo, Cardiac Doppler, Color Doppler and Intracardiac            Opacification Agent Indications:    CHF-Acute Diastolic  History:        Patient has prior history of Echocardiogram examinations, most                 recent 11/26/2018. Mitral Valve Disease and Aortic Valve Disease,                 Arrythmias:Atrial Fibrillation; Signs/Symptoms:Shortness of                 Breath.  Sonographer:    Clayton Lefort RDCS (AE) Referring Phys: [id number] [facility]  Sonographer Comments: Suboptimal parasternal window and  patient is morbidly obese. Image acquisition challenging due to patient body habitus. IMPRESSIONS  1. Left ventricular ejection fraction, by  estimation, is 55 to 60%. The left ventricle has normal function. The left ventricle has no regional wall motion abnormalities. There is moderate concentric left ventricular hypertrophy. Left ventricular diastolic function could not be evaluated.  2. Right ventricular systolic function is mildly reduced. The right ventricular size is mildly enlarged. There is moderately elevated pulmonary artery systolic pressure. The estimated right ventricular systolic pressure is 35.0 mmHg.  3. Left atrial size was severely dilated.  4. Right atrial size was mildly dilated.  5. Central jet of moderate to severe MR (ERO area 0.3 cmsq, regurgitant volume 37 mL, regurgitant fraction 50%). The mitral valve is degenerative. Moderate to severe mitral valve regurgitation. No evidence of mitral stenosis.  6. The aortic valve is grossly normal. Aortic valve regurgitation is trivial. Very mild aortic valve stenosis. Gradients may be underestimated.  7. The inferior vena cava is dilated in size with <50% respiratory variability, suggesting right atrial pressure of 15 mmHg. Comparison(s): Prior images unable to be directly viewed, comparison made by report only. Mitral regurgitation is substantially worse. Estimated systolic PA pressure is much higher. FINDINGS  Left Ventricle: Left ventricular ejection fraction, by estimation, is 55 to 60%. The left ventricle has normal function. The left ventricle has no regional wall motion abnormalities. Definity contrast agent was given IV to delineate the left ventricular  endocardial borders. The left ventricular internal cavity size was normal in size. There is moderate concentric left ventricular hypertrophy. Left ventricular diastolic function could not be evaluated due to atrial fibrillation. Left ventricular diastolic function could not be evaluated. Right Ventricle: The right ventricular size is mildly enlarged. No increase in right ventricular wall thickness. Right ventricular systolic function  is mildly reduced. There is moderately elevated pulmonary artery systolic pressure. The tricuspid regurgitant velocity is 2.88 m/s, and with an assumed right atrial pressure of 15 mmHg, the estimated right ventricular systolic pressure is 09.3 mmHg. Left Atrium: Left atrial size was severely dilated. Right Atrium: Right atrial size was mildly dilated. Pericardium: There is no evidence of pericardial effusion. Mitral Valve: Central jet of moderate to severe MR (ERO area 0.3 cmsq, regurgitant volume 37 mL, regurgitant fraction 50%). The mitral valve is degenerative in appearance. There is moderate thickening of the mitral valve leaflet(s). Moderate mitral annular calcification. Moderate to severe mitral valve regurgitation, with centrally-directed jet. No evidence of mitral valve stenosis. Tricuspid Valve: The tricuspid valve is grossly normal. Tricuspid valve regurgitation is not demonstrated. Aortic Valve: The aortic valve is grossly normal.. There is moderate thickening and moderate calcification of the aortic valve. Aortic valve regurgitation is trivial. Mild aortic stenosis is present. There is moderate thickening of the aortic valve. There is moderate calcification of the aortic valve. Aortic valve mean gradient measures 7.4 mmHg. Aortic valve peak gradient measures 13.2 mmHg. Aortic valve area, by VTI measures 1.34 cm. Pulmonic Valve: The pulmonic valve was not well visualized. Pulmonic valve regurgitation is not visualized. Aorta: The aortic root and ascending aorta are structurally normal, with no evidence of dilitation. Venous: The inferior vena cava is dilated in size with less than 50% respiratory variability, suggesting right atrial pressure of 15 mmHg. IAS/Shunts: No atrial level shunt detected by color flow Doppler.  LEFT VENTRICLE PLAX 2D LVIDd:         4.50 cm LVIDs:         3.10 cm LV PW:  1.40 cm LV IVS:        1.40 cm LVOT diam:     2.00 cm LV SV:         40 LV SV Index:   17 LVOT Area:      3.14 cm  RIGHT VENTRICLE            IVC RV Basal diam:  3.10 cm    IVC diam: 3.30 cm RV S prime:     7.65 cm/s TAPSE (M-mode): 1.1 cm LEFT ATRIUM             Index       RIGHT ATRIUM           Index LA diam:        5.00 cm 2.11 cm/m  RA Area:     20.40 cm LA Vol (A2C):   69.2 ml 29.25 ml/m RA Volume:   56.30 ml  23.80 ml/m LA Vol (A4C):   78.3 ml 33.10 ml/m LA Biplane Vol: 79.4 ml 33.57 ml/m  AORTIC VALVE AV Area (Vmax):    1.29 cm AV Area (Vmean):   1.27 cm AV Area (VTI):     1.34 cm AV Vmax:           181.40 cm/s AV Vmean:          129.400 cm/s AV VTI:            0.300 m AV Peak Grad:      13.2 mmHg AV Mean Grad:      7.4 mmHg LVOT Vmax:         74.32 cm/s LVOT Vmean:        52.240 cm/s LVOT VTI:          0.128 m LVOT/AV VTI ratio: 0.43  AORTA Ao Root diam: 4.30 cm Ao Asc diam:  3.70 cm MR Peak grad:    100.8 mmHg  TRICUSPID VALVE MR Mean grad:    57.0 mmHg   TR Peak grad:   33.2 mmHg MR Vmax:         502.00 cm/s TR Vmax:        288.00 cm/s MR Vmean:        348.0 cm/s MR PISA:         4.02 cm    SHUNTS MR PISA Eff ROA: 31 mm      Systemic VTI:  0.13 m MR PISA Radius:  0.80 cm     Systemic Diam: 2.00 cm Sanda Klein MD Electronically signed by Sanda Klein MD Signature Date/Time: 07/02/2020/2:06:40 PM    Final     Cardiac Studies  TTE 07/02/2020 1. Left ventricular ejection fraction, by estimation, is 55 to 60%. The  left ventricle has normal function. The left ventricle has no regional  wall motion abnormalities. There is moderate concentric left ventricular  hypertrophy. Left ventricular  diastolic function could not be evaluated.  2. Right ventricular systolic function is mildly reduced. The right  ventricular size is mildly enlarged. There is moderately elevated  pulmonary artery systolic pressure. The estimated right ventricular  systolic pressure is 18.5 mmHg.  3. Left atrial size was severely dilated.  4. Right atrial size was mildly dilated.  5. Central jet of moderate to  severe MR (ERO area 0.3 cmsq, regurgitant  volume 37 mL, regurgitant fraction 50%). The mitral valve is degenerative.  Moderate to severe mitral valve regurgitation. No evidence of mitral  stenosis.  6. The aortic valve is grossly normal. Aortic  valve regurgitation is  trivial. Very mild aortic valve stenosis. Gradients may be underestimated.  7. The inferior vena cava is dilated in size with <50% respiratory  variability, suggesting right atrial pressure of 15 mmHg.   Patient Profile  Anthony Byrd is a 49 y.o. male with persistent Afib, HFpEF, obesity, OSA on CPAP who was admitted 07/02/2020 with ADHF and Afib with RVR.   Assessment & Plan   1. Acute on Chronic HFpEF -grossly volume overloaded. BNP low due to obesity.  -good urine output with 80 mg IV lasix BID; continue this with goal 3-5 UOP daily -40 mEq potassium BID, 2 g magnesium today -rates not controlled due to volume overload  2. Afib with RVR -driven by volume. -continue dilt drip for now -increased metoprolol to 50 mg Q6 hours -continue xarelto -hopefully rates ill improve with diuresis -will transition to all oral agents as able, but not today -if rates remain an issue, may need in house TEE/DCCV  3. Moderate to severe MR? -no murmur of severe MR -TEE 2020 with mild MR -regurgitant lesions always looks worse with volume overload. Will consider limited TTE once euvolemic.   For questions or updates, please contact [place] Please consult [email or web address] for contact info under   Time Spent with Patient: I have spent a total of 25 minutes with patient reviewing hospital notes, telemetry, EKGs, labs and examining the patient as well as establishing an assessment and plan that was discussed with the patient.  > 50% of time was spent in direct patient care.    Signed, Addison Naegeli. Audie Box, [place]  07/03/2020 8:11 AM

## 2020-07-03 NOTE — Plan of Care (Signed)

## 2020-07-03 NOTE — Progress Notes (Signed)
   07/03/20 0836  Assess: MEWS Score  Temp 98.1 F (36.7 C)  BP 120/88  Pulse Rate (!) 109  ECG Heart Rate (!) 114  Resp 18  SpO2 95 %  O2 Device Room Air  Assess: MEWS Score  MEWS Temp 0  MEWS Systolic 0  MEWS Pulse 2  MEWS RR 0  MEWS LOC 0  MEWS Score 2  MEWS Score Color Yellow  Treat  Pain Scale 0-10  Pain Score 0

## 2020-07-03 NOTE — Progress Notes (Signed)
Patient refused CPAP for tonight. RT instructed patient to have RT called if he changes his mind. RT will monitor as needed. 

## 2020-07-03 NOTE — Progress Notes (Signed)
   Subjective:  Patient seen at bedside this AM. Reports he feels better and is breathing better. Notes that his legs are still swollen, but do not feel as tight as yesterday. Denies CP.  Objective:  Vital signs in last 24 hours: Vitals:   07/02/20 2350 07/03/20 0302 07/03/20 0409 07/03/20 0410  BP: (!) 121/96 119/87 122/89 122/89  Pulse: (!) 112  (!) 118 (!) 118  Resp: 20  19 19   Temp: 98 F (36.7 C)  98.1 F (36.7 C) 98.1 F (36.7 C)  TempSrc: Oral  Oral   SpO2: 95%  94%   Weight:   125.2 kg   Height:       Physical Exam: General: Sitting in chair without supp O2, no acute distress CV: Tachycardic, irregular rhythm. No murmurs/rubs/gallops Pulm: Decreased BS at bases bilaterally. No wheezing or rales. MSK: 2+ pitting edema bilaterally  Assessment/Plan:  Active Problems:   OSA (obstructive sleep apnea)   Atrial fibrillation with RVR (HCC)   Acute on chronic diastolic (congestive) heart failure ([place])  Patient is 49yo male with Afib, HFpEF, OSA admitted for acute on chronic diastolic heart failure exacerbation in setting of Afib with RVR.   #Acute on chronic diastolic HF exacerbation On arrival to ED, patient hypoxic, appeared grossly volume overloaded although BNP 135. Trop 22 > 24. TTE performed yesterday revealed diastolic dysfunction, minimal decrease in EF from last Echo in January, and moderate-severe MR. MR likely worsened by hypervolemic state. This AM patient with 2+ pitting edema, decreased BS at bases bilaterally, currently not requiring supp O2. Continue diuresis with goal 3-5L UOP daily. Goal K>4, Mg>2. Appreciate cardiology recs. -IV lasix 80mg  BID -Repleting K, Mg  -Daily K, Mg labs -Daily weights -I&Os  #Afib with RVR Patient previously failed cardioversion, appears there is element of medication non-compliance. This AM, patient w/ HR 100-120's, irregularly irregular rhythm. Cardiology following, plan to stabilize with rate control and follow-up for  possible cardioversion in 3-4 weeks. If unable to obtain rate control during stay, can consider inpatient cardioversion. Will increase beta blocker and continue with dilt drip. Appreciate cardiology recs. -Diltiazem gtt -Increase Lopressor 50mg  q6 -Xarelto 20mg  qd -Tele  #OSA on CPAP Patient reportedly not wearing CPAP prior to arrival. Will continue to encourage use of CPAP inpatient and at discharge. -CPAP QHS  #AKI Baseline Cr 1.11. On arrival, Cr 1.29 < 1.52. Hopeful to have continued improvement with diuresis. -I&Os -Daily BMP  Diet: HH IVF: n/a Bowel: Miralax DVT PPX: Xarelto Code Status: Full Prior to Admission Living Arrangement: home Anticipated Discharge Location: home Barriers to Discharge: rate control, volume overload Dispo: Anticipated discharge in approximately 2-3 day(s).   Anthony Dame, MD 07/03/2020, 5:47 AM Pager: [phone] After 5pm on weekdays and 1pm on weekends: On Call pager [phone]

## 2020-07-04 ENCOUNTER — Inpatient Hospital Stay (HOSPITAL_COMMUNITY): Payer: Self-pay

## 2020-07-04 DIAGNOSIS — G4733 Obstructive sleep apnea (adult) (pediatric): Secondary | ICD-10-CM

## 2020-07-04 LAB — URINALYSIS, ROUTINE W REFLEX MICROSCOPIC
Bacteria, UA: NONE SEEN
Bilirubin Urine: NEGATIVE
Glucose, UA: NEGATIVE mg/dL
Ketones, ur: NEGATIVE mg/dL
Nitrite: NEGATIVE
Protein, ur: 100 mg/dL — AB
RBC / HPF: >50 RBC/hpf — ABNORMAL HIGH (ref 0–5)
Specific Gravity, Urine: 1.016 (ref 1.005–1.030)
WBC, UA: >50 WBC/hpf — ABNORMAL HIGH (ref 0–5)
pH: 5 (ref 5.0–8.0)

## 2020-07-04 LAB — BASIC METABOLIC PANEL
Anion gap: 15 (ref 5–15)
BUN: 44 mg/dL — ABNORMAL HIGH (ref 6–20)
CO2: 22 mmol/L (ref 22–32)
Calcium: 9 mg/dL (ref 8.9–10.3)
Chloride: 97 mmol/L — ABNORMAL LOW (ref 98–111)
Creatinine, Ser: 2.61 mg/dL — ABNORMAL HIGH (ref 0.61–1.24)
GFR calc Af Amer: 32 mL/min — ABNORMAL LOW (ref >60)
GFR calc non Af Amer: 27 mL/min — ABNORMAL LOW (ref >60)
Glucose, Bld: 160 mg/dL — ABNORMAL HIGH (ref 70–99)
Potassium: 4.6 mmol/L (ref 3.5–5.1)
Sodium: 134 mmol/L — ABNORMAL LOW (ref 135–145)

## 2020-07-04 LAB — CBC
HCT: 50.1 % (ref 39.0–52.0)
Hemoglobin: 15.8 g/dL (ref 13.0–17.0)
MCH: 27.5 pg (ref 26.0–34.0)
MCHC: 31.5 g/dL (ref 30.0–36.0)
MCV: 87.1 fL (ref 80.0–100.0)
Platelets: 242 10*3/uL (ref 150–400)
RBC: 5.75 MIL/uL (ref 4.22–5.81)
RDW: 15.5 % (ref 11.5–15.5)
WBC: 13.1 10*3/uL — ABNORMAL HIGH (ref 4.0–10.5)
nRBC: 0 % (ref 0.0–0.2)

## 2020-07-04 LAB — CK: Total CK: 119 U/L (ref 49–397)

## 2020-07-04 LAB — HEPATIC FUNCTION PANEL
ALT: 40 U/L (ref 0–44)
AST: 56 U/L — ABNORMAL HIGH (ref 15–41)
Albumin: 3.2 g/dL — ABNORMAL LOW (ref 3.5–5.0)
Alkaline Phosphatase: 52 U/L (ref 38–126)
Bilirubin, Direct: 0.6 mg/dL — ABNORMAL HIGH (ref 0.0–0.2)
Indirect Bilirubin: 1.8 mg/dL — ABNORMAL HIGH (ref 0.3–0.9)
Total Bilirubin: 2.4 mg/dL — ABNORMAL HIGH (ref 0.3–1.2)
Total Protein: 6.7 g/dL (ref 6.5–8.1)

## 2020-07-04 MED ORDER — METOPROLOL TARTRATE 50 MG PO TABS: 50.0000 mg | ORAL_TABLET | Freq: Once | ORAL | Status: DC

## 2020-07-04 MED ORDER — SODIUM CHLORIDE 0.9 % IV SOLN
2.0000 g | INTRAVENOUS | Status: DC
  Administered 2020-07-04 – 2020-07-08 (×5): 2 g via INTRAVENOUS
  Filled 2020-07-04: qty 2
  Filled 2020-07-04 (×5): qty 20
  Filled 2020-07-04 (×2): qty 2

## 2020-07-04 MED ORDER — METOPROLOL TARTRATE 100 MG PO TABS
100.0000 mg | ORAL_TABLET | Freq: Four times a day (QID) | ORAL | Status: DC
  Administered 2020-07-04 – 2020-07-08 (×14): 100 mg via ORAL
  Filled 2020-07-04 (×14): qty 1

## 2020-07-04 MED ORDER — FUROSEMIDE 10 MG/ML IJ SOLN
120.0000 mg | Freq: Two times a day (BID) | INTRAVENOUS | Status: DC
  Administered 2020-07-04: 120 mg via INTRAVENOUS
  Filled 2020-07-04: qty 10
  Filled 2020-07-04: qty 12

## 2020-07-04 MED ORDER — RIVAROXABAN 15 MG PO TABS
15.0000 mg | ORAL_TABLET | Freq: Every day | ORAL | Status: DC
  Administered 2020-07-04: 15 mg via ORAL
  Filled 2020-07-04: qty 1

## 2020-07-04 MED ORDER — SIMETHICONE 80 MG PO CHEW
80.0000 mg | CHEWABLE_TABLET | Freq: Four times a day (QID) | ORAL | Status: DC | PRN
  Administered 2020-07-04: 80 mg via ORAL
  Filled 2020-07-04: qty 1

## 2020-07-04 NOTE — Progress Notes (Signed)
Patient voided 225 cc after receiving PM dose of lasix on 8/14.  Patient bladder scanned and showed 108 cc. He voided 275 cc this morning and it is very dark. Urine left in room for MD to see. Patient went to have a bowel movement this morning and attempted to urinate, but a small amount of bright red blood came out, no urine.  He did not sleep at all last night because he could not get comfortable.

## 2020-07-04 NOTE — Progress Notes (Addendum)
Cardiology Progress Note  Patient ID: Anthony Byrd MRN: [id number] DOB: Oct 10, 1971 Date of Encounter: 07/04/2020  Primary Cardiologist: Buford Dresser, MD  Subjective   Chief Complaint: Still short of breath.  HPI: Good diuresis but still short of breath.  Not back to baseline.  2+ pitting edema bilaterally.  ROS:  All other ROS reviewed and negative. Pertinent positives noted in the HPI.     Inpatient Medications  Scheduled Meds: . metoprolol tartrate  100 mg Oral Q6H  . metoprolol tartrate  50 mg Oral Once  . potassium chloride  40 mEq Oral BID  . rivaroxaban  20 mg Oral Q supper   Continuous Infusions: . furosemide     PRN Meds: acetaminophen, guaiFENesin-dextromethorphan, polyethylene glycol, simethicone   Vital Signs   Vitals:   07/04/20 0408 07/04/20 0554 07/04/20 0759 07/04/20 0759  BP: (!) 143/109  97/76 97/76  Pulse: 87 84 69 69  Resp: 20  20   Temp: (!) 97.4 F (36.3 C)  (!) 96.5 F (35.8 C) (!) 96.5 F (35.8 C)  TempSrc: Oral   Axillary  SpO2: 93%   96%  Weight: 126.2 kg     Height:        Intake/Output Summary (Last 24 hours) at 07/04/2020 0910 Last data filed at 07/04/2020 0327 Gross per 24 hour  Intake 362.5 ml  Output 1925 ml  Net -1562.5 ml   Last 3 Weights 07/04/2020 07/03/2020 07/02/2020  Weight (lbs) 278 lb 3.2 oz 276 lb 1.6 oz 272 lb 8 oz  Weight (kg) 126.19 kg 125.238 kg 123.605 kg      Telemetry  Overnight telemetry shows atrial fibrillation with heart rate in 70s, which I personally reviewed.   ECG  The most recent ECG shows A. fib heart rate 124, which I personally reviewed.   Physical Exam   Vitals:   07/04/20 0408 07/04/20 0554 07/04/20 0759 07/04/20 0759  BP: (!) 143/109  97/76 97/76  Pulse: 87 84 69 69  Resp: 20  20   Temp: (!) 97.4 F (36.3 C)  (!) 96.5 F (35.8 C) (!) 96.5 F (35.8 C)  TempSrc: Oral   Axillary  SpO2: 93%   96%  Weight: 126.2 kg     Height:         Intake/Output Summary (Last 24 hours)  at 07/04/2020 0910 Last data filed at 07/04/2020 0327 Gross per 24 hour  Intake 362.5 ml  Output 1925 ml  Net -1562.5 ml    Last 3 Weights 07/04/2020 07/03/2020 07/02/2020  Weight (lbs) 278 lb 3.2 oz 276 lb 1.6 oz 272 lb 8 oz  Weight (kg) 126.19 kg 125.238 kg 123.605 kg    Body mass index is 41.08 kg/m.  General: Well nourished, well developed, in no acute distress Head: Atraumatic, normal size  Eyes: PEERLA, EOMI  Neck: Supple, +JVD Endocrine: No thryomegaly Cardiac: Normal S1, S2; irregular rhythm, no murmurs rubs or gallops Lungs: +rales Abd: Soft, nontender, no hepatomegaly  Ext: 2+ pitting edema  Musculoskeletal: No deformities, BUE and BLE strength normal and equal Skin: Warm and dry, no rashes   Neuro: Alert and oriented to person, place, time, and situation, CNII-XII grossly intact, no focal deficits  Psych: Normal mood and affect   Labs  High Sensitivity Troponin:   Recent Labs  Lab 07/02/20 0246 07/02/20 0439  TROPONINIHS 22* 24*     Cardiac EnzymesNo results for input(s): TROPONINI in the last 168 hours. No results for input(s): TROPIPOC in the last  168 hours.  Chemistry Recent Labs  Lab 06/30/20 1350 07/02/20 0246 07/03/20 0352  NA 140 135 136  K 3.5 4.1 3.5  CL 99 98 97*  CO2 29 25 28   GLUCOSE 177* 137* 118*  BUN 27* 31* 29*  CREATININE 1.20 1.52* 1.29*  CALCIUM 8.7* 8.8* 8.5*  PROT  --   --  6.7  ALBUMIN  --   --  3.2*  AST  --   --  37  ALT  --   --  20  ALKPHOS  --   --  57  BILITOT  --   --  2.5*  GFRNONAA >60 52* >60  GFRAA >60 >60 >60  ANIONGAP 12 12 11     Hematology Recent Labs  Lab 07/02/20 0246 07/03/20 0352  WBC 11.7* 10.4  RBC 5.79 5.24  HGB 16.4 14.5  HCT 51.9 45.9  MCV 89.6 87.6  MCH 28.3 27.7  MCHC 31.6 31.6  RDW 15.6* 15.2  PLT 228 191   BNP Recent Labs  Lab 06/28/20 1047 07/02/20 0246  BNP 135.9* 106.3*    DDimer No results for input(s): DDIMER in the last 168 hours.   Radiology  ECHOCARDIOGRAM  COMPLETE  Result Date: 07/02/2020    ECHOCARDIOGRAM REPORT   Patient Name:   Anthony Byrd Date of Exam: 07/02/2020 Medical Rec #:  [id number]      Height:       69.0 in Accession #:    [id number]     Weight:       275.0 lb Date of Birth:  January 14, 1971      BSA:          2.365 m Patient Age:    49 years       BP:           123/84 mmHg Patient Gender: M              HR:           123 bpm. Exam Location:  Inpatient Procedure: 2D Echo, Cardiac Doppler, Color Doppler and Intracardiac            Opacification Agent Indications:    CHF-Acute Diastolic  History:        Patient has prior history of Echocardiogram examinations, most                 recent 11/26/2018. Mitral Valve Disease and Aortic Valve Disease,                 Arrythmias:Atrial Fibrillation; Signs/Symptoms:Shortness of                 Breath.  Sonographer:    Clayton Lefort RDCS (AE) Referring Phys: [id number] [facility]  Sonographer Comments: Suboptimal parasternal window and patient is morbidly obese. Image acquisition challenging due to patient body habitus. IMPRESSIONS  1. Left ventricular ejection fraction, by estimation, is 55 to 60%. The left ventricle has normal function. The left ventricle has no regional wall motion abnormalities. There is moderate concentric left ventricular hypertrophy. Left ventricular diastolic function could not be evaluated.  2. Right ventricular systolic function is mildly reduced. The right ventricular size is mildly enlarged. There is moderately elevated pulmonary artery systolic pressure. The estimated right ventricular systolic pressure is 60.1 mmHg.  3. Left atrial size was severely dilated.  4. Right atrial size was mildly dilated.  5. Central jet of moderate to severe MR (ERO area 0.3 cmsq, regurgitant volume 37 mL, regurgitant fraction  50%). The mitral valve is degenerative. Moderate to severe mitral valve regurgitation. No evidence of mitral stenosis.  6. The aortic valve is grossly normal. Aortic valve  regurgitation is trivial. Very mild aortic valve stenosis. Gradients may be underestimated.  7. The inferior vena cava is dilated in size with <50% respiratory variability, suggesting right atrial pressure of 15 mmHg. Comparison(s): Prior images unable to be directly viewed, comparison made by report only. Mitral regurgitation is substantially worse. Estimated systolic PA pressure is much higher. FINDINGS  Left Ventricle: Left ventricular ejection fraction, by estimation, is 55 to 60%. The left ventricle has normal function. The left ventricle has no regional wall motion abnormalities. Definity contrast agent was given IV to delineate the left ventricular  endocardial borders. The left ventricular internal cavity size was normal in size. There is moderate concentric left ventricular hypertrophy. Left ventricular diastolic function could not be evaluated due to atrial fibrillation. Left ventricular diastolic function could not be evaluated. Right Ventricle: The right ventricular size is mildly enlarged. No increase in right ventricular wall thickness. Right ventricular systolic function is mildly reduced. There is moderately elevated pulmonary artery systolic pressure. The tricuspid regurgitant velocity is 2.88 m/s, and with an assumed right atrial pressure of 15 mmHg, the estimated right ventricular systolic pressure is 16.9 mmHg. Left Atrium: Left atrial size was severely dilated. Right Atrium: Right atrial size was mildly dilated. Pericardium: There is no evidence of pericardial effusion. Mitral Valve: Central jet of moderate to severe MR (ERO area 0.3 cmsq, regurgitant volume 37 mL, regurgitant fraction 50%). The mitral valve is degenerative in appearance. There is moderate thickening of the mitral valve leaflet(s). Moderate mitral annular calcification. Moderate to severe mitral valve regurgitation, with centrally-directed jet. No evidence of mitral valve stenosis. Tricuspid Valve: The tricuspid valve is grossly  normal. Tricuspid valve regurgitation is not demonstrated. Aortic Valve: The aortic valve is grossly normal.. There is moderate thickening and moderate calcification of the aortic valve. Aortic valve regurgitation is trivial. Mild aortic stenosis is present. There is moderate thickening of the aortic valve. There is moderate calcification of the aortic valve. Aortic valve mean gradient measures 7.4 mmHg. Aortic valve peak gradient measures 13.2 mmHg. Aortic valve area, by VTI measures 1.34 cm. Pulmonic Valve: The pulmonic valve was not well visualized. Pulmonic valve regurgitation is not visualized. Aorta: The aortic root and ascending aorta are structurally normal, with no evidence of dilitation. Venous: The inferior vena cava is dilated in size with less than 50% respiratory variability, suggesting right atrial pressure of 15 mmHg. IAS/Shunts: No atrial level shunt detected by color flow Doppler.  LEFT VENTRICLE PLAX 2D LVIDd:         4.50 cm LVIDs:         3.10 cm LV PW:         1.40 cm LV IVS:        1.40 cm LVOT diam:     2.00 cm LV SV:         40 LV SV Index:   17 LVOT Area:     3.14 cm  RIGHT VENTRICLE            IVC RV Basal diam:  3.10 cm    IVC diam: 3.30 cm RV S prime:     7.65 cm/s TAPSE (M-mode): 1.1 cm LEFT ATRIUM             Index       RIGHT ATRIUM  Index LA diam:        5.00 cm 2.11 cm/m  RA Area:     20.40 cm LA Vol (A2C):   69.2 ml 29.25 ml/m RA Volume:   56.30 ml  23.80 ml/m LA Vol (A4C):   78.3 ml 33.10 ml/m LA Biplane Vol: 79.4 ml 33.57 ml/m  AORTIC VALVE AV Area (Vmax):    1.29 cm AV Area (Vmean):   1.27 cm AV Area (VTI):     1.34 cm AV Vmax:           181.40 cm/s AV Vmean:          129.400 cm/s AV VTI:            0.300 m AV Peak Grad:      13.2 mmHg AV Mean Grad:      7.4 mmHg LVOT Vmax:         74.32 cm/s LVOT Vmean:        52.240 cm/s LVOT VTI:          0.128 m LVOT/AV VTI ratio: 0.43  AORTA Ao Root diam: 4.30 cm Ao Asc diam:  3.70 cm MR Peak grad:    100.8 mmHg   TRICUSPID VALVE MR Mean grad:    57.0 mmHg   TR Peak grad:   33.2 mmHg MR Vmax:         502.00 cm/s TR Vmax:        288.00 cm/s MR Vmean:        348.0 cm/s MR PISA:         4.02 cm    SHUNTS MR PISA Eff ROA: 31 mm      Systemic VTI:  0.13 m MR PISA Radius:  0.80 cm     Systemic Diam: 2.00 cm Sanda Klein MD Electronically signed by Sanda Klein MD Signature Date/Time: 07/02/2020/2:06:40 PM    Final     Cardiac Studies  TTE 07/02/2020 1. Left ventricular ejection fraction, by estimation, is 55 to 60%. The  left ventricle has normal function. The left ventricle has no regional  wall motion abnormalities. There is moderate concentric left ventricular  hypertrophy. Left ventricular  diastolic function could not be evaluated.  2. Right ventricular systolic function is mildly reduced. The right  ventricular size is mildly enlarged. There is moderately elevated  pulmonary artery systolic pressure. The estimated right ventricular  systolic pressure is 32.6 mmHg.  3. Left atrial size was severely dilated.  4. Right atrial size was mildly dilated.  5. Central jet of moderate to severe MR (ERO area 0.3 cmsq, regurgitant  volume 37 mL, regurgitant fraction 50%). The mitral valve is degenerative.  Moderate to severe mitral valve regurgitation. No evidence of mitral  stenosis.  6. The aortic valve is grossly normal. Aortic valve regurgitation is  trivial. Very mild aortic valve stenosis. Gradients may be underestimated.  7. The inferior vena cava is dilated in size with <50% respiratory  variability, suggesting right atrial pressure of 15 mmHg.   Patient Profile  Anthony Byrd is a 49 y.o. male with persistent atrial fibrillation, HFpEF, obesity, OSA on CPAP who was admitted on 07/02/2020 with acute diastolic heart failure and A. fib with RVR.  Assessment & Plan   1.  Acute on chronic HFpEF -BNP low in the setting of morbid obesity. -I have increased his Lasix to 120 mg IV twice daily.   Goal urine output 3 to 5 L/day.  Aggressive potassium replacement.  Labs pending today.  Please ensure they get done. -Rates much better with volume removal. -He is net -1.3 L overnight.  Net -4.9 L since admission.  2.  Atrial fibrillation with RVR -Related to volume overload. -Rates are much better on metoprolol and diltiazem drip.  Wean off diltiazem drip.  Increase metoprolol tartrate to 100 mg every 4 hours. -Continue Xarelto. -We will see how he does with continued volume removal.  May consider TEE/cardioversion this admission.  If rates are better, and volume status improves, we can consider outpatient cardioversion.  He has had issues with compliance in the past so we should not rush into a cardioversion this admission.  3.  Moderate to severe MR? -No murmur on exam.  Suspect this was just worsening of his MR in the setting of volume overload.  I do not think this is driving his A. fib.  Continue with diuresis as above.  Can consider repeat echocardiogram at a later date.  Currently I am not concerned about this.  For questions or updates, please contact [place] Please consult [email or web address] for contact info under   Time Spent with Patient: I have spent a total of 25 minutes with patient reviewing hospital notes, telemetry, EKGs, labs and examining the patient as well as establishing an assessment and plan that was discussed with the patient.  > 50% of time was spent in direct patient care.    Signed, Addison Naegeli. Audie Box, [place]  07/04/2020 9:10 AM

## 2020-07-04 NOTE — Progress Notes (Addendum)
Subjective:  Hospital day 2 for Anthony Byrd, a 49 yo male with a past medical history of Afib with RVR, chronic diastolic HF, OSA on CPAP, MR, and remote testicular cancer.  Patient seen and examined this morning while sitting upright in his chair. Patient states that he feels bad overall. He was not able to sleep, as he could not get comfortable and his shortness of breath worsens when he lays flat. Patient denies any chest pain or palpitations currently. He states that his leg swelling seems to have improved, but is still noticeable and causing some discomfort. He is also having bilateral lower abdominal discomfort secondary to his abdominal swelling. Patient having good urine output, net -5 L since admission. Patient reports that his urine has been much darker in nature and he has noticed some bright red blood while urinating. Denies any dysuria, increased urgency, or increase in urinary frequency.   Objective:  Vital signs in last 24 hours: Vitals:   07/04/20 0408 07/04/20 0554 07/04/20 0759 07/04/20 0759  BP: (!) 143/109  97/76 97/76  Pulse: 87 84 69 69  Resp: 20  20   Temp: (!) 97.4 F (36.3 C)  (!) 96.5 F (35.8 C) (!) 96.5 F (35.8 C)  TempSrc: Oral   Axillary  SpO2: 93%   96%  Weight: 126.2 kg     Height:       Weight change: 2.585 kg  Intake/Output Summary (Last 24 hours) at 07/04/2020 1101 Last data filed at 07/04/2020 0327 Gross per 24 hour  Intake 326.75 ml  Output 825 ml  Net -498.25 ml   Physical Exam HENT:     Head: Normocephalic and atraumatic.  Eyes:     Pupils: Pupils are equal, round, and reactive to light.  Cardiovascular:     Rate and Rhythm: Normal rate. Rhythm irregular.     Heart sounds: Normal heart sounds. No murmur heard.   Pulmonary:     Effort: Pulmonary effort is normal.     Breath sounds: Rales present.     Comments: Decreased breath sounds at bilateral lung bases Abdominal:     General: There is distension.     Palpations: Abdomen is  soft.  Musculoskeletal:     Comments: 2+ pitting edema  Skin:    General: Skin is warm.  Neurological:     General: No focal deficit present.     Mental Status: He is alert.     Cranial Nerves: No cranial nerve deficit.     Assessment/Plan:  Active Problems:   OSA (obstructive sleep apnea)   Atrial fibrillation with RVR (HCC)   Acute on chronic diastolic (congestive) heart failure ([place])  1. Acute on chronic diastolic HF Patient is volume overloaded. BNP falsely low on admission due to body habitus. TTE performed 8/13 and revealed LVEF of 55-60% along with moderate-severe MR. Patient still with bilateral 2+ pitting edema. Currently not requiring supplemental O2. -2.3 L urine output yesterday, net -5 L since admission.  - Appreciate cardio recs  -Continue IV Lasix 80 mg BID - Monitor K and Mg, replete PRN - Daily weights and strict I&Os - Goal 3-5 L UOP daily   2. Afib with RVR Diagnosed with Afib with RVR in 11/2018 s/p failed cardioversion. This morning, HR much improved, in the 70s, irregular rhythm. Possible cardioversion in 3-4 weeks pending stable rate control during admission.  - Appreciate cardio recs - Diltiazem gtt weaned - Metoprolol 100 mg q6 PO - Continue xarelto 20 mg  qd - Telemetry monitoring  3. OSA Patient has a hx of OSA, has not been wearing CPAP at home recently as the patient states it makes his SOB worse. - Refused CPAP last night - CPAP qhs  4. AKI Baseline Cr around 1.11, elevated at 1.52 on arrival. Cr elevated at 2.61 this morning 8/15. - Monitor daily BMP - Continue diuresis with Lasix 80 mg IV - Follow CK level and UA  - Bladder scan   LOS: 2 days   Dorethea Clan, Medical Student 07/04/2020, 11:01 AM Pager: [phone]  Attestation for Student Documentation I personally was present and performed or re-performed the history, physical exam and medical decision-making activities of this service and have verified that the service and findings  are accurately documented in the student's note  Anthony Byrd is a 49 yo M w/ PMH of HFpEF, OSA, admit for a.fib w/ rvr and diastolic heart failure exacerbation. Had significant diuresis since admission but unfortunately renal fx worsening. Still appear significantly volume overloaded on exam. Urine also appears dark. With mitral valve regurg and a.fib w/ rvr, possibly having poor renal perfusion resulting in ATN. Need UA to further assess. Also need bladder scan to r/o obstruction. He may be intravascularly depleted although his exam is significantly hypervolemic in which case we would suspect hypoalbuminenmia. Suspect his hypervolemia may be in part attributed to continued diltiazem drip. HR appear improved  (down to 60-80s) on oral metoprolol. Plan to d/c dilt gtt today per cardiology.  Mosetta Anis, MD 07/04/2020, 11:31 AM Pager: [phone] After 5pm on weekdays and 1pm on weekends: On Call Pager: [phone]

## 2020-07-05 ENCOUNTER — Ambulatory Visit (HOSPITAL_COMMUNITY): Payer: Self-pay | Admitting: Physician Assistant

## 2020-07-05 DIAGNOSIS — N3000 Acute cystitis without hematuria: Secondary | ICD-10-CM

## 2020-07-05 LAB — CBC
HCT: 45.8 % (ref 39.0–52.0)
Hemoglobin: 14.7 g/dL (ref 13.0–17.0)
MCH: 27.8 pg (ref 26.0–34.0)
MCHC: 32.1 g/dL (ref 30.0–36.0)
MCV: 86.7 fL (ref 80.0–100.0)
Platelets: 224 10*3/uL (ref 150–400)
RBC: 5.28 MIL/uL (ref 4.22–5.81)
RDW: 15.3 % (ref 11.5–15.5)
WBC: 11.1 10*3/uL — ABNORMAL HIGH (ref 4.0–10.5)
nRBC: 0 % (ref 0.0–0.2)

## 2020-07-05 LAB — BASIC METABOLIC PANEL
Anion gap: 13 (ref 5–15)
BUN: 47 mg/dL — ABNORMAL HIGH (ref 6–20)
CO2: 23 mmol/L (ref 22–32)
Calcium: 8.4 mg/dL — ABNORMAL LOW (ref 8.9–10.3)
Chloride: 100 mmol/L (ref 98–111)
Creatinine, Ser: 1.7 mg/dL — ABNORMAL HIGH (ref 0.61–1.24)
GFR calc Af Amer: 53 mL/min — ABNORMAL LOW (ref >60)
GFR calc non Af Amer: 46 mL/min — ABNORMAL LOW (ref >60)
Glucose, Bld: 119 mg/dL — ABNORMAL HIGH (ref 70–99)
Potassium: 3.6 mmol/L (ref 3.5–5.1)
Sodium: 136 mmol/L (ref 135–145)

## 2020-07-05 LAB — URINE CULTURE: Culture: NO GROWTH

## 2020-07-05 LAB — UREA NITROGEN, URINE: Urea Nitrogen, Ur: 305 mg/dL

## 2020-07-05 MED ORDER — FUROSEMIDE 10 MG/ML IJ SOLN
120.0000 mg | Freq: Two times a day (BID) | INTRAVENOUS | Status: DC
  Administered 2020-07-05 – 2020-07-06 (×4): 120 mg via INTRAVENOUS
  Filled 2020-07-05: qty 10
  Filled 2020-07-05: qty 12
  Filled 2020-07-05 (×2): qty 10
  Filled 2020-07-05: qty 12
  Filled 2020-07-05: qty 10

## 2020-07-05 MED ORDER — RIVAROXABAN 20 MG PO TABS
20.0000 mg | ORAL_TABLET | Freq: Every day | ORAL | Status: DC
  Administered 2020-07-05 – 2020-07-08 (×4): 20 mg via ORAL
  Filled 2020-07-05 (×4): qty 1

## 2020-07-05 MED ORDER — POTASSIUM CHLORIDE CRYS ER 20 MEQ PO TBCR
40.0000 meq | EXTENDED_RELEASE_TABLET | Freq: Two times a day (BID) | ORAL | Status: DC
  Administered 2020-07-05 – 2020-07-06 (×4): 40 meq via ORAL
  Filled 2020-07-05 (×4): qty 2

## 2020-07-05 NOTE — Progress Notes (Signed)
Pt states he will place cpap on himself when ready.  RT will continue to monitor

## 2020-07-05 NOTE — Progress Notes (Signed)
Patient states he will place himself on CPAP once ready.  RT will continue to monitor.

## 2020-07-05 NOTE — Progress Notes (Signed)
Subjective:  Day 3 for Anthony Byrd, a 49 yo male with a past medical history of Afib with RVR s/p failed cardioversion, HFpEF, OSA, MR, and remote testicular cancer.  Patient seen and examined at the beside this morning. He reports that he is doing well today. States that his breathing continues to improve and he has not needed to use any supplemental O2. Continues to deny any chest pain or palpitations. He notes that his leg swelling and abdominal swelling are about the same as yesterday. Informed the patient that this could be because we held his diuretic dose due to elevation of his Creatinine level yesterday. Despite this, he is no longer having any leg discomfort or abdominal pain. Had 850 cc of urine output yesterday, net -4.5 L. Patient states that his urine is not as dark as it was yesterday and he feels like his antibiotic is helping him to feel better. Denies any dysuria. Overall he feels like he is improving.   Objective:  Vital signs in last 24 hours: Vitals:   07/04/20 2227 07/05/20 0556 07/05/20 0558 07/05/20 0818  BP: 115/79 110/89 110/89 109/89  Pulse: (!) 110 96 (!) 102 (!) 116  Resp:    20  Temp:   98 F (36.7 C) 97.6 F (36.4 C)  TempSrc:   Oral Oral  SpO2:   92% 93%  Weight:   125.4 kg   Height:       Weight change: -0.771 kg  Intake/Output Summary (Last 24 hours) at 07/05/2020 1122 Last data filed at 07/05/2020 1056 Gross per 24 hour  Intake 1207.71 ml  Output 1700 ml  Net -492.29 ml   Physical Exam HENT:     Head: Normocephalic and atraumatic.  Eyes:     Pupils: Pupils are equal, round, and reactive to light.  Cardiovascular:     Rate and Rhythm: Tachycardia present. Rhythm irregular.     Heart sounds: Normal heart sounds. No murmur heard.   Pulmonary:     Effort: Pulmonary effort is normal.     Breath sounds: Normal breath sounds.  Abdominal:     General: There is distension.     Palpations: Abdomen is soft.     Tenderness: There is no abdominal  tenderness.  Musculoskeletal:     Right lower leg: Edema present.     Left lower leg: Edema present.     Comments: 2+ edema bilaterally  Skin:    General: Skin is warm.  Neurological:     General: No focal deficit present.     Mental Status: He is alert. Mental status is at baseline.     Assessment/Plan:  Active Problems:   OSA (obstructive sleep apnea)   Atrial fibrillation with RVR (HCC)   Acute on chronic diastolic (congestive) heart failure ([place])  1. Acute on chronic diastolic HF Patient continues to be volume overloaded on exam with 2+ bilateral pitting edema. TTE from 8/13 revealed LVEF of 55-60% with moderate-severe MR. Remains off of supplemental O2. Lasix dose was held yesterday due to rising Cr level. Had 850 cc of urine output yesterday, net -4.5 L.  - Appreciate cardio recs - Resumed Lasix as Cr improved; Dose increased to 120 mg IV BID per cardio - Strict I&Os and daily weights  2. Afib with RVR s/p failed cardioversion Patienr remains in Afib, but asymptomatic. HR increased since yesterday, ranging from 100-120s. Diltiazem drip was weaned and discontinued yesterday. Cardio considering cardioversion once volume status is improved, if he does  not convert on his own. If not done this admission, will consider on an outpatient basis.  - Continue metoprolol 100 mg PO q6 - Xarelto 20 mg - Monitor K; aggressive K replacement as this is driving factor of his Afib - KCl 40 mEq oral BID -  3. OSA Patient continues to be noncompliant with CPAP - CPAP qhs  3. AKI Creatinine baseline ~1.1 Elevated to 2.6 yesterday and has decreased to 1.7 this morning. Lasix afternoon dose was held yesterday due to rise in Cr. CK level within normal range. UA performed showing protein, leukocytes, WBC clumps, mucus, hyaline casts, granular casts, and amorphous crystals. Bladder scan also performed yesterday showing no urinary retention.  - Resume Lasix - Continue to monitor Cr carefully  4.  Cystitis  CT Renal stone study performed yesterday after results of UA returned. CT showed large right sided renal calculi without hydronephrosis. Pericystic edema was also seen, concerning for cystitis.  - Urology consulted, patient will follow up on outpatient basis - Continue IV Ceftriaxone 2 g daily    LOS: 3 days   Anthony Byrd, Medical Student 07/05/2020, 11:22 AM  Pager: [phone]

## 2020-07-05 NOTE — Hospital Course (Addendum)
  Hospital Course:  Acute on Chronic Heart Failure. Patient presented with SOB and a cough secondary to a heart failure exacerbation. Prior to admission, patient was taking Lasix 80mg  BID at home for the last 4 days with increased urination. During admission, patient was diuresed with Lasix 80 IV BID and dose was increased on 8/16 to 120 BID. Net output -19.2 L. Discharged on PO Lasix 40 mg daily.  Afib with RVR. Patient diagnosed with Afib with RVR in 11/2018 s/p failed cardioversion. Patient was lost to follow up and was started on Cardizem 120 BID and metoprolol 12.5 BID on 8/10 when he was seen in the Afib clinic. Started on diltiazem drip on admission which was continued for 24 hours along with PO Lopressor 50 mg q6. After dilt drip was weaned, patient continued on metoprolol 100 q6. Successful cardioversion performed on hospital day 6 and metoprolol dose decreased to 100 BID. Xarelto 20 was switched to Eliquis 5 BID after patient developed hemoptysis. Mitral Regurgitation. Patient has history of MR, no murmur noted on exam. TTE this admission showed moderate to severe MR likely worsening in the setting of volume overload. TEE showing MR that appears to be restricted [place] pathology. TEE was cut short due to hypoxia. Will follow up with cardio for TTE as outpatient.  AKI. Patient's baseline creatinine is around 1.1 On hospital day 2, Cr elevated to 2.61 and his diuretics were subsequently held. Cr decreased to 1.7 on hospital day 3 and diuretics were resumed at an increased dose (Lasix 120 IV BID). Creatinine returned to baseline on hospital day 5.   Cystitis. Patient had an episode of dark urine on hospital day 2. UA was performed, concerning for ATN. Urine culture negative. CT renal stone study was performed and showed large R sided renal calculi without hydronephrosis. Pericystic edema seem, concerning for cystitis. Ceftriaxone 2 g IV initiated on 8/15 and finished 5 day course on 8/19.   Cough/Hemoptysis. Patient developed a cough on the night of hospital day 7 that was productive of blood tinged sputum. Per cardio, this could have been due to patient's Xarelto. Xarelto was then discontinued and switched to Eliquis 5 BID. CXR ordered to rule out other causes of hemoptysis. CXR similar when compared to previous one.

## 2020-07-05 NOTE — Progress Notes (Addendum)
Progress Note  Patient Name: Anthony Byrd Date of Encounter: 07/05/2020  [facility] Cardiologist: Buford Dresser, MD   Subjective   Urine output recorded as only 886mL. He remains in afib RVR with rates around 100bpm. He is still volume overloaded on exam. No chest pain.   Inpatient Medications    Scheduled Meds: . metoprolol tartrate  100 mg Oral Q6H  . metoprolol tartrate  50 mg Oral Once  . potassium chloride  40 mEq Oral BID  . rivaroxaban  15 mg Oral Q supper   Continuous Infusions: . cefTRIAXone (ROCEPHIN)  IV 2 g (07/04/20 1722)  . furosemide     PRN Meds: acetaminophen, guaiFENesin-dextromethorphan, polyethylene glycol, simethicone   Vital Signs    Vitals:   07/04/20 2032 07/04/20 2227 07/05/20 0556 07/05/20 0558  BP: 115/79 115/79 110/89 110/89  Pulse: (!) 106 (!) 110 96 (!) 102  Resp: (!) 22     Temp: 97.6 F (36.4 C)   98 F (36.7 C)  TempSrc:    Oral  SpO2:    92%  Weight:    125.4 kg  Height:        Intake/Output Summary (Last 24 hours) at 07/05/2020 0705 Last data filed at 07/05/2020 0600 Gross per 24 hour  Intake 1207.71 ml  Output 850 ml  Net 357.71 ml   Last 3 Weights 07/05/2020 07/04/2020 07/03/2020  Weight (lbs) 276 lb 8 oz 278 lb 3.2 oz 276 lb 1.6 oz  Weight (kg) 125.42 kg 126.19 kg 125.238 kg      Telemetry    Afib HR around 100, up to 120s - Personally Reviewed  ECG    No new - Personally Reviewed  Physical Exam   GEN: No acute distress.   Neck: No JVD Cardiac: RRR, no murmurs, rubs, or gallops.  Respiratory: Clear to auscultation bilaterally. GI: Soft, nontender, non-distended  MS: 2+ B/L edema; No deformity. Neuro:  Nonfocal  Psych: Normal affect   Labs    High Sensitivity Troponin:   Recent Labs  Lab 07/02/20 0246 07/02/20 0439  TROPONINIHS 22* 24*      Chemistry Recent Labs  Lab 07/03/20 0352 07/04/20 0936 07/04/20 1440 07/05/20 0325  NA 136 134*  --  136  K 3.5 4.6  --  3.6  CL 97* 97*   --  100  CO2 28 22  --  23  GLUCOSE 118* 160*  --  119*  BUN 29* 44*  --  47*  CREATININE 1.29* 2.61*  --  1.70*  CALCIUM 8.5* 9.0  --  8.4*  PROT 6.7  --  6.7  --   ALBUMIN 3.2*  --  3.2*  --   AST 37  --  56*  --   ALT 20  --  40  --   ALKPHOS 57  --  52  --   BILITOT 2.5*  --  2.4*  --   GFRNONAA >60 27*  --  46*  GFRAA >60 32*  --  53*  ANIONGAP 11 15  --  13     Hematology Recent Labs  Lab 07/03/20 0352 07/04/20 1440 07/05/20 0325  WBC 10.4 13.1* 11.1*  RBC 5.24 5.75 5.28  HGB 14.5 15.8 14.7  HCT 45.9 50.1 45.8  MCV 87.6 87.1 86.7  MCH 27.7 27.5 27.8  MCHC 31.6 31.5 32.1  RDW 15.2 15.5 15.3  PLT 191 242 224    BNP Recent Labs  Lab 06/28/20 1047 07/02/20 0246  BNP 135.9* 106.3*     DDimer No results for input(s): DDIMER in the last 168 hours.   Radiology    CT RENAL STONE STUDY  Result Date: 07/04/2020 CLINICAL DATA:  Acute nephritic syndrome. EXAM: CT ABDOMEN AND PELVIS WITHOUT CONTRAST TECHNIQUE: Multidetector CT imaging of the abdomen and pelvis was performed following the standard protocol without IV contrast. COMPARISON:  08/29/2019 FINDINGS: Lower chest: Right greater than left base airspace disease. Mild cardiomegaly. Right coronary artery atherosclerosis. Small bilateral pleural effusions, new. Hepatobiliary: Moderate caudate lobe enlargement. Normal gallbladder. No biliary duct dilatation. Pancreas: Normal, without mass or ductal dilatation. Spleen: Normal in size, without focal abnormality. Adrenals/Urinary Tract: Normal adrenal glands. Mild renal cortical thinning bilaterally. Large volume right-sided renal calculi, including within the inter/upper pole at up to the 2.1 cm on 34/3. A right renal pelvic stone measures 1.8 cm versus 1.6 cm previously. 7 mm hyperattenuating interpolar left renal lesion is incompletely characterized, most likely a hemorrhagic/proteinaceous cyst. No hydronephrosis. No hydroureter or ureteric calculi. Pericystic interstitial  thickening on 78/3 is new. Stomach/Bowel: Normal stomach, without wall thickening. Normal colon and terminal ileum. Vascular/Lymphatic: The visualized aorta is normal in caliber. There are extensive surgical changes within the abdominal retroperitoneum, which are grossly similar and obscure the aorta and IVC on this noncontrast exam. Porta hepatis node of 1.4 cm on 28/3 is not significantly changed. Reproductive: Normal prostate. Other: New small volume perihepatic ascites. A right paramidline ventral wall hernia contains fat and fluid. Possibly a small portion of small bowel without obstruction on 42/3. Musculoskeletal: Degenerate disc disease at the lumbosacral junction. IMPRESSION: 1. Large volume right-sided renal calculi, without hydronephrosis or distal stone. 2. New Pericystic edema, for which cystitis is a concern. 3. New small bilateral pleural effusions with bibasilar atelectasis. 4. New small volume perihepatic ascites. 5. Suspicion of cirrhosis.  Correlate with risk factors. 6. Right paramidline ventral abdominal wall hernia containing fat, fluid, and possibly minimal nonobstructive small bowel. 7. Age advanced coronary artery atherosclerosis. Recommend assessment of coronary risk factors and consideration of medical therapy. 8.  Aortic Atherosclerosis (ICD10-I70.0). Electronically Signed   By: Abigail Miyamoto M.D.   On: 07/04/2020 15:16    Cardiac Studies   TTE 07/02/2020 1. Left ventricular ejection fraction, by estimation, is 55 to 60%. The  left ventricle has normal function. The left ventricle has no regional  wall motion abnormalities. There is moderate concentric left ventricular  hypertrophy. Left ventricular  diastolic function could not be evaluated.  2. Right ventricular systolic function is mildly reduced. The right  ventricular size is mildly enlarged. There is moderately elevated  pulmonary artery systolic pressure. The estimated right ventricular  systolic pressure is 02.7  mmHg.  3. Left atrial size was severely dilated.  4. Right atrial size was mildly dilated.  5. Central jet of moderate to severe MR (ERO area 0.3 cmsq, regurgitant  volume 37 mL, regurgitant fraction 50%). The mitral valve is degenerative.  Moderate to severe mitral valve regurgitation. No evidence of mitral  stenosis.  6. The aortic valve is grossly normal. Aortic valve regurgitation is  trivial. Very mild aortic valve stenosis. Gradients may be underestimated.  7. The inferior vena cava is dilated in size with <50% respiratory  variability, suggesting right atrial pressure of 15 mmHg.   Patient Profile     49 y.o. male with persistent atrial fibrillation, HFpEF, obesity, OSA on CPAP who was admitted on 07/02/2020 with acute diastolic heart failure and A. fib with RVR.  Assessment &  Plan   Acute on chronic diastolic CHF - BNP low in the setting of obesity - Lasix increased to IV 120mg  BID, however held for rising creatinine - urine output overnight 867mL, he is net -4.5L - weight down 2 lbs since yesterday  - creatinine improved 2.61>1.70 - continue diuresis  Afib RVR  - in the setting of acute CHF - IV dilt discontinued and metoprolol increased to 100mg  Q4H - Xarelto - Echo showed severely dilated LA. Patient remains in afib with rates around 100bpm. Patient is asymptomatic. TEE/DCCV might be necessary if pt doesn't convert on his own.   Mod to severe MR - per echo this admission - continue to follow OP   OSA - noncompliant with CPAP  AKI - creatinine better today 2.60>1.70  For questions or updates, please contact [place] Please consult [email or web address] for contact info under        Signed, Karolyna Bianchini Ninfa Meeker, PA-C  07/05/2020, 7:05 AM

## 2020-07-06 DIAGNOSIS — R140 Abdominal distension (gaseous): Secondary | ICD-10-CM

## 2020-07-06 LAB — CBC
HCT: 46.9 % (ref 39.0–52.0)
Hemoglobin: 14.8 g/dL (ref 13.0–17.0)
MCH: 27.9 pg (ref 26.0–34.0)
MCHC: 31.6 g/dL (ref 30.0–36.0)
MCV: 88.3 fL (ref 80.0–100.0)
Platelets: 224 10*3/uL (ref 150–400)
RBC: 5.31 MIL/uL (ref 4.22–5.81)
RDW: 15.2 % (ref 11.5–15.5)
WBC: 9.7 10*3/uL (ref 4.0–10.5)
nRBC: 0 % (ref 0.0–0.2)

## 2020-07-06 LAB — BASIC METABOLIC PANEL
Anion gap: 11 (ref 5–15)
BUN: 41 mg/dL — ABNORMAL HIGH (ref 6–20)
CO2: 28 mmol/L (ref 22–32)
Calcium: 8.9 mg/dL (ref 8.9–10.3)
Chloride: 99 mmol/L (ref 98–111)
Creatinine, Ser: 1.49 mg/dL — ABNORMAL HIGH (ref 0.61–1.24)
GFR calc Af Amer: >60 mL/min (ref >60)
GFR calc non Af Amer: 54 mL/min — ABNORMAL LOW (ref >60)
Glucose, Bld: 121 mg/dL — ABNORMAL HIGH (ref 70–99)
Potassium: 4.1 mmol/L (ref 3.5–5.1)
Sodium: 138 mmol/L (ref 135–145)

## 2020-07-06 NOTE — Progress Notes (Addendum)
Patient had multiple pauses on tele. Patient sleeping with CPAP on and asymptomatic at the time of the events. RN sent message to [facility] medicine provider Arby Barrette, MD to notify of events and clarify need for AM dose of Lopressor.   Received callback from provider with verbal to hold 0500  Dose of Lopressor.

## 2020-07-06 NOTE — Progress Notes (Signed)
Visit made to patients room to help with CPAP.  He advised me he will administer CPAP when he is ready for bed.

## 2020-07-06 NOTE — Progress Notes (Signed)
Progress Note  Patient Name: Anthony Byrd Date of Encounter: 07/06/2020  Primary Cardiologist: Buford Dresser, MD   Subjective   Feeling well today. Remains in AF with rates in the upper 110's. Fluid volume improved. No chest pain.   Inpatient Medications    Scheduled Meds:  metoprolol tartrate  100 mg Oral Q6H   metoprolol tartrate  50 mg Oral Once   potassium chloride  40 mEq Oral BID   rivaroxaban  20 mg Oral Q supper   Continuous Infusions:  cefTRIAXone (ROCEPHIN)  IV 2 g (07/05/20 1720)   furosemide 120 mg (07/05/20 1723)   PRN Meds: acetaminophen, guaiFENesin-dextromethorphan, polyethylene glycol, simethicone   Vital Signs    Vitals:   07/05/20 1605 07/05/20 2033 07/06/20 0056 07/06/20 0323  BP: 117/78 122/88 (!) 130/92 100/73  Pulse: (!) 120 (!) 107 (!) 113 (!) 103  Resp: 18 20 18 18   Temp: 97.8 F (36.6 C) 98.2 F (36.8 C) 98.5 F (36.9 C)   TempSrc: Oral Oral Oral   SpO2: 94% 96% 91% 98%  Weight:      Height:        Intake/Output Summary (Last 24 hours) at 07/06/2020 0833 Last data filed at 07/05/2020 2346 Gross per 24 hour  Intake 480 ml  Output 5175 ml  Net -4695 ml   Filed Weights   07/03/20 0409 07/04/20 0408 07/05/20 0558  Weight: 125.2 kg 126.2 kg 125.4 kg    Physical Exam   General: Obese, NAD Neck: Negative for carotid bruits. No JVD Lungs:Clear to ausculation bilaterally. Breathing is unlabored. Cardiovascular: Irregularly irregular with S1 S2. No murmurs Abdomen: Soft, non-tender, distended. No obvious abdominal masses. Extremities: 1+ BLE edema. Radial pulses 2+ bilaterally Neuro: Alert and oriented. No focal deficits. No facial asymmetry. MAE spontaneously. Psych: Responds to questions appropriately with normal affect.    Labs    Chemistry Recent Labs  Lab 07/03/20 0352 07/03/20 0352 07/04/20 0936 07/04/20 1440 07/05/20 0325 07/06/20 0338  NA 136   < > 134*  --  136 138  K 3.5   < > 4.6  --  3.6 4.1   CL 97*   < > 97*  --  100 99  CO2 28   < > 22  --  23 28  GLUCOSE 118*   < > 160*  --  119* 121*  BUN 29*   < > 44*  --  47* 41*  CREATININE 1.29*   < > 2.61*  --  1.70* 1.49*  CALCIUM 8.5*   < > 9.0  --  8.4* 8.9  PROT 6.7  --   --  6.7  --   --   ALBUMIN 3.2*  --   --  3.2*  --   --   AST 37  --   --  56*  --   --   ALT 20  --   --  40  --   --   ALKPHOS 57  --   --  52  --   --   BILITOT 2.5*  --   --  2.4*  --   --   GFRNONAA >60   < > 27*  --  46* 54*  GFRAA >60   < > 32*  --  53* >60  ANIONGAP 11   < > 15  --  13 11   < > = values in this interval not displayed.     Hematology Recent Labs  Lab 07/04/20 1440 07/05/20 0325 07/06/20 0338  WBC 13.1* 11.1* 9.7  RBC 5.75 5.28 5.31  HGB 15.8 14.7 14.8  HCT 50.1 45.8 46.9  MCV 87.1 86.7 88.3  MCH 27.5 27.8 27.9  MCHC 31.5 32.1 31.6  RDW 15.5 15.3 15.2  PLT 242 224 224    Cardiac EnzymesNo results for input(s): TROPONINI in the last 168 hours. No results for input(s): TROPIPOC in the last 168 hours.   BNP Recent Labs  Lab 07/02/20 0246  BNP 106.3*     DDimer No results for input(s): DDIMER in the last 168 hours.   Radiology    CT RENAL STONE STUDY  Result Date: 07/04/2020 CLINICAL DATA:  Acute nephritic syndrome. EXAM: CT ABDOMEN AND PELVIS WITHOUT CONTRAST TECHNIQUE: Multidetector CT imaging of the abdomen and pelvis was performed following the standard protocol without IV contrast. COMPARISON:  08/29/2019 FINDINGS: Lower chest: Right greater than left base airspace disease. Mild cardiomegaly. Right coronary artery atherosclerosis. Small bilateral pleural effusions, new. Hepatobiliary: Moderate caudate lobe enlargement. Normal gallbladder. No biliary duct dilatation. Pancreas: Normal, without mass or ductal dilatation. Spleen: Normal in size, without focal abnormality. Adrenals/Urinary Tract: Normal adrenal glands. Mild renal cortical thinning bilaterally. Large volume right-sided renal calculi, including within the  inter/upper pole at up to the 2.1 cm on 34/3. A right renal pelvic stone measures 1.8 cm versus 1.6 cm previously. 7 mm hyperattenuating interpolar left renal lesion is incompletely characterized, most likely a hemorrhagic/proteinaceous cyst. No hydronephrosis. No hydroureter or ureteric calculi. Pericystic interstitial thickening on 78/3 is new. Stomach/Bowel: Normal stomach, without wall thickening. Normal colon and terminal ileum. Vascular/Lymphatic: The visualized aorta is normal in caliber. There are extensive surgical changes within the abdominal retroperitoneum, which are grossly similar and obscure the aorta and IVC on this noncontrast exam. Porta hepatis node of 1.4 cm on 28/3 is not significantly changed. Reproductive: Normal prostate. Other: New small volume perihepatic ascites. A right paramidline ventral wall hernia contains fat and fluid. Possibly a small portion of small bowel without obstruction on 42/3. Musculoskeletal: Degenerate disc disease at the lumbosacral junction. IMPRESSION: 1. Large volume right-sided renal calculi, without hydronephrosis or distal stone. 2. New Pericystic edema, for which cystitis is a concern. 3. New small bilateral pleural effusions with bibasilar atelectasis. 4. New small volume perihepatic ascites. 5. Suspicion of cirrhosis.  Correlate with risk factors. 6. Right paramidline ventral abdominal wall hernia containing fat, fluid, and possibly minimal nonobstructive small bowel. 7. Age advanced coronary artery atherosclerosis. Recommend assessment of coronary risk factors and consideration of medical therapy. 8.  Aortic Atherosclerosis (ICD10-I70.0). Electronically Signed   By: Abigail Miyamoto M.D.   On: 07/04/2020 15:16   Telemetry    Atrial fibrillation with rates in the 110's  - Personally Reviewed  ECG    No new tracing as of 07/06/2020- Personally Reviewed  Cardiac Studies   TTE 07/02/2020 1. Left ventricular ejection fraction, by estimation, is 55 to 60%.  The  left ventricle has normal function. The left ventricle has no regional  wall motion abnormalities. There is moderate concentric left ventricular  hypertrophy. Left ventricular  diastolic function could not be evaluated.  2. Right ventricular systolic function is mildly reduced. The right  ventricular size is mildly enlarged. There is moderately elevated  pulmonary artery systolic pressure. The estimated right ventricular  systolic pressure is 67.3 mmHg.  3. Left atrial size was severely dilated.  4. Right atrial size was mildly dilated.  5. Central jet of moderate to severe  MR (ERO area 0.3 cmsq, regurgitant  volume 37 mL, regurgitant fraction 50%). The mitral valve is degenerative.  Moderate to severe mitral valve regurgitation. No evidence of mitral  stenosis.  6. The aortic valve is grossly normal. Aortic valve regurgitation is  trivial. Very mild aortic valve stenosis. Gradients may be underestimated.  7. The inferior vena cava is dilated in size with <50% respiratory  variability, suggesting right atrial pressure of 15 mmHg.   Patient Profile     49 y.o. male with persistent atrial fibrillation, HFpEF, obesity, OSA on CPAP who was admitted on 07/02/2020 with acute diastolic heart failure and A. fib with RVR.  Assessment & Plan    1. Acute on chronic diastolic CHF: -IV Lasix 937 mg twice daily restarted yesterday given creatinine improvement -Weight, 276lb>> down from 278lb  -I&O, net -9.7 L -Creatinine significantly improved, 1.49 today down from 1.70 07/05/20 -Continue diuresis as above  2.  Afib RVR: -Likely in the setting of acute CHF -IV diltiazem discontinued>> replace with metoprolol 100 mg every 6 hours -Anticoagulated with Xarelto with stable hemoglobin -Echocardiogram with severely dilated LA -Plan for TEE/DCCV tomorrow given improvement in fluid volume status   3. Mod to severe MR: -Plan for reassessment once optimal fluid volume status  4.  OSA: -Noncompliant with CPAP  5. AKI: -Stabilizing, creatinine today 1.49 -Continue IV Lasix 120 mg BID -K+ stable at 4.0    Signed, Kathyrn Drown NP-C [place] Pager: [phone] 07/06/2020, 8:33 AM     For questions or updates, please contact   Please consult [email or web address] for contact info under Cardiology/STEMI.

## 2020-07-06 NOTE — Progress Notes (Signed)
Subjective:  Hospital day 4 for Anthony Byrd, a 49 yo male with a past medical history of Afib with RVR, HFpEF, MR, OSA, and remote testicular cancer.  Patient seen and examined at the bedside this morning. He states that he is feeling well overall. He reports improvements in his breathing and has not required supplemental O2. O2 sat remains around 95% on RA while being examined. Patient endorses a mild cough productive of white sputum still. He denies any SOB, chest pain, or palpitations at this time. He states that his leg swelling has also improved. He notes that although his abdomen is still swollen, he is not longer having any discomfort associated with this. He is urinating well and denies any dysuria or hematuria. He states that his urine is still somewhat dark in the morning, but improves throughout the day and is much lighter in color.  Objective:  Vital signs in last 24 hours: Vitals:   07/06/20 0323 07/06/20 0843 07/06/20 0955 07/06/20 1000  BP: 100/73 (!) 127/93 121/80 121/80  Pulse: (!) 103 (!) 107 (!) 115 (!) 134  Resp: 18 18  16   Temp:  98 F (36.7 C)    TempSrc:  Oral    SpO2: 98% 92%  95%  Weight:      Height:       Weight change:   Intake/Output Summary (Last 24 hours) at 07/06/2020 1108 Last data filed at 07/06/2020 1103 Gross per 24 hour  Intake 480 ml  Output 5575 ml  Net -5095 ml   Physical Exam HENT:     Head: Normocephalic and atraumatic.  Eyes:     Pupils: Pupils are equal, round, and reactive to light.  Cardiovascular:     Rate and Rhythm: Tachycardia present. Rhythm irregular.     Heart sounds: Normal heart sounds.  Pulmonary:     Effort: Pulmonary effort is normal.     Breath sounds: Normal breath sounds. No wheezing, rhonchi or rales.     Comments: Clear to auscultation bilaterally. Abdominal:     General: There is distension.     Palpations: Abdomen is soft.     Tenderness: There is no abdominal tenderness.  Musculoskeletal:        General:  No tenderness.     Right lower leg: Edema present.     Left lower leg: Edema present.     Comments: 1+ bilateral lower extremity edema  Skin:    General: Skin is warm.  Neurological:     General: No focal deficit present.     Mental Status: He is alert. Mental status is at baseline.     Assessment/Plan:  Active Problems:   OSA (obstructive sleep apnea)   Atrial fibrillation with RVR (HCC)   Acute on chronic diastolic (congestive) heart failure ([place]) 1. Acute on Chronic HF Patient remains volume overloaded on physical exam, but is diuresing well. Had 5.6 L of urine output over the last 24 hours with a net of -9.7 L. Remains off of supplemental O2 and reports no SOB. Blood pressure has been stable.  - Continue diuresis with Lasix 120 mg IV BID  - Strict I&Os and daily weights - Appreciate cardio recs  2. Afib with RVR Patient remains in Afib but is asymptomatic. HR elevated this morning in the 100-130 range. Morning dose of metoprolol was initially held due to pauses that were noted on tele overnight, but was later given due to increase in heart rate. Continue to aggressively replete potassium as  this is a driving factor behind his Afib. - Metoprolol 100 q6 - Xarelto 20  - KCl 40 mEq BID - Plan for TEE/Cardioversion tomorrow   - NPO at midnight  3. AKI Baseline creatinine is around 1.1. Elevated to 2.6 on 8/15 and has been  downtrending since then (2.6 --> 1.7 --> 1.49 today).  - Continue diuresis with Lasix - Monitor Cr/BMP   4. Cystitis  CT renal stone study performed and revealed a large right sided renal calculi without any evidence of hydronephrosis. Pericystic edema also noted, concerning for cystitis. Urine culture came back negative. - Continue IV Ceftriaxone 2 g daily (will complete 5 day course of antibiotics) - Follow up with urology as outpatient  5. OSA Patient worse his CPAP last night and reported no difficulty with breathing. - CPAP qhs     LOS: 4 days    Dorethea Clan, Medical Student 07/06/2020, 11:08 AM  Pager: [phone]

## 2020-07-07 LAB — CBC
HCT: 48.6 % (ref 39.0–52.0)
Hemoglobin: 15.2 g/dL (ref 13.0–17.0)
MCH: 27.4 pg (ref 26.0–34.0)
MCHC: 31.3 g/dL (ref 30.0–36.0)
MCV: 87.7 fL (ref 80.0–100.0)
Platelets: 237 10*3/uL (ref 150–400)
RBC: 5.54 MIL/uL (ref 4.22–5.81)
RDW: 15.1 % (ref 11.5–15.5)
WBC: 8.1 10*3/uL (ref 4.0–10.5)
nRBC: 0 % (ref 0.0–0.2)

## 2020-07-07 LAB — BASIC METABOLIC PANEL
Anion gap: 11 (ref 5–15)
BUN: 33 mg/dL — ABNORMAL HIGH (ref 6–20)
CO2: 27 mmol/L (ref 22–32)
Calcium: 9 mg/dL (ref 8.9–10.3)
Chloride: 101 mmol/L (ref 98–111)
Creatinine, Ser: 1.13 mg/dL (ref 0.61–1.24)
GFR calc Af Amer: >60 mL/min (ref >60)
GFR calc non Af Amer: >60 mL/min (ref >60)
Glucose, Bld: 90 mg/dL (ref 70–99)
Potassium: 3.9 mmol/L (ref 3.5–5.1)
Sodium: 139 mmol/L (ref 135–145)

## 2020-07-07 MED ORDER — SODIUM CHLORIDE 0.9 % IV SOLN: INTRAVENOUS | Status: DC

## 2020-07-07 MED ORDER — FUROSEMIDE 10 MG/ML IJ SOLN
120.0000 mg | Freq: Two times a day (BID) | INTRAVENOUS | Status: DC
  Administered 2020-07-07: 120 mg via INTRAVENOUS
  Filled 2020-07-07 (×3): qty 12
  Filled 2020-07-07: qty 10

## 2020-07-07 NOTE — Progress Notes (Signed)
Pt will self administer CPAP when ready for bed

## 2020-07-07 NOTE — H&P (View-Only) (Signed)
Progress Note  Patient Name: Anthony Byrd Date of Encounter: 07/07/2020  [facility] Cardiologist: Buford Dresser, MD   Subjective   Patient diuresed -5.2L overnight, weight down 4 lbs. Creatinine improved. Patient is still in afib with heart rates around 100. Plan for TEE/DCCV today. Patient denies chest pain.   Inpatient Medications    Scheduled Meds: . metoprolol tartrate  100 mg Oral Q6H  . metoprolol tartrate  50 mg Oral Once  . rivaroxaban  20 mg Oral Q supper   Continuous Infusions: . cefTRIAXone (ROCEPHIN)  IV Stopped (07/06/20 2200)   PRN Meds: acetaminophen, guaiFENesin-dextromethorphan, polyethylene glycol, simethicone   Vital Signs    Vitals:   07/06/20 1634 07/06/20 2028 07/07/20 0003 07/07/20 0445  BP: 133/90 130/85 119/70 109/81  Pulse: (!) 5 (!) 117 (!) 101 (!) 103  Resp:  17 17 18   Temp: 98.5 F (36.9 C) 98 F (36.7 C) 98.2 F (36.8 C) 98.1 F (36.7 C)  TempSrc: Oral Oral Oral Oral  SpO2: 100% 98% 96% 98%  Weight:    118.3 kg  Height:        Intake/Output Summary (Last 24 hours) at 07/07/2020 0752 Last data filed at 07/07/2020 0536 Gross per 24 hour  Intake 970 ml  Output 6250 ml  Net -5280 ml   Last 3 Weights 07/07/2020 07/05/2020 07/04/2020  Weight (lbs) 260 lb 11.2 oz 276 lb 8 oz 278 lb 3.2 oz  Weight (kg) 118.253 kg 125.42 kg 126.19 kg      Telemetry    Afib HR around 100 - Personally Reviewed  ECG    No new - Personally Reviewed  Physical Exam   GEN: No acute distress.   Neck: No JVD Cardiac: Irreg Irreg, no murmurs, rubs, or gallops.  Respiratory: Clear to auscultation bilaterally. GI: Soft, nontender, non-distended  MS: mild edema; No deformity. Neuro:  Nonfocal  Psych: Normal affect   Labs    High Sensitivity Troponin:   Recent Labs  Lab 07/02/20 0246 07/02/20 0439  TROPONINIHS 22* 24*      Chemistry Recent Labs  Lab 07/03/20 0352 07/04/20 0936 07/04/20 1440 07/05/20 0325 07/06/20 0338  07/07/20 0642  NA 136   < >  --  136 138 139  K 3.5   < >  --  3.6 4.1 3.9  CL 97*   < >  --  100 99 101  CO2 28   < >  --  23 28 27   GLUCOSE 118*   < >  --  119* 121* 90  BUN 29*   < >  --  47* 41* 33*  CREATININE 1.29*   < >  --  1.70* 1.49* 1.13  CALCIUM 8.5*   < >  --  8.4* 8.9 9.0  PROT 6.7  --  6.7  --   --   --   ALBUMIN 3.2*  --  3.2*  --   --   --   AST 37  --  56*  --   --   --   ALT 20  --  40  --   --   --   ALKPHOS 57  --  52  --   --   --   BILITOT 2.5*  --  2.4*  --   --   --   GFRNONAA >60   < >  --  46* 54* >60  GFRAA >60   < >  --  53* >60 >60  ANIONGAP 11   < >  --  13 11 11    < > = values in this interval not displayed.     Hematology Recent Labs  Lab 07/05/20 0325 07/06/20 0338 07/07/20 0642  WBC 11.1* 9.7 8.1  RBC 5.28 5.31 5.54  HGB 14.7 14.8 15.2  HCT 45.8 46.9 48.6  MCV 86.7 88.3 87.7  MCH 27.8 27.9 27.4  MCHC 32.1 31.6 31.3  RDW 15.3 15.2 15.1  PLT 224 224 237    BNP Recent Labs  Lab 07/02/20 0246  BNP 106.3*     DDimer No results for input(s): DDIMER in the last 168 hours.   Radiology    No results found.  Cardiac Studies   TTE 07/02/2020 1. Left ventricular ejection fraction, by estimation, is 55 to 60%. The  left ventricle has normal function. The left ventricle has no regional  wall motion abnormalities. There is moderate concentric left ventricular  hypertrophy. Left ventricular  diastolic function could not be evaluated.  2. Right ventricular systolic function is mildly reduced. The right  ventricular size is mildly enlarged. There is moderately elevated  pulmonary artery systolic pressure. The estimated right ventricular  systolic pressure is 46.6 mmHg.  3. Left atrial size was severely dilated.  4. Right atrial size was mildly dilated.  5. Central jet of moderate to severe MR (ERO area 0.3 cmsq, regurgitant  volume 37 mL, regurgitant fraction 50%). The mitral valve is degenerative.  Moderate to severe mitral  valve regurgitation. No evidence of mitral  stenosis.  6. The aortic valve is grossly normal. Aortic valve regurgitation is  trivial. Very mild aortic valve stenosis. Gradients may be underestimated.  7. The inferior vena cava is dilated in size with <50% respiratory  variability, suggesting right atrial pressure of 15 mmHg.   Patient Profile     49 y.o. male with persistent atrial fibrillation, HFpEF, obesity, OSA on CPAP who was admitted on 07/02/2020 with acute diastolic heart failure and A. fib with RVR.  Assessment & Plan    Acute on chronic diastolic CHF - IV lasix 120mg  BID restarted  - weight 275lbs>>260lbs - Overnight pt put out -5.2L, net -15L - creatinine improved 1.49>1.13 - Echo showed LVEF 55-60% - Volume status is improving however would continue with diuresis, suspect might not need much more IV diuresis.   Afib RVR - in the setting of acute CHF - metoprolol for rate control - Echo with severely dilated LA - Xarelto - TEE/DCCV today  Mod to severe MR - TEE today  OSA - noncompliance with CPAP  AKI - creatinine improving with diuresis   For questions or updates, please contact [facility] Please consult [email or web address] for contact info under        Signed, Urban Naval Ninfa Meeker, PA-C  07/07/2020, 7:52 AM

## 2020-07-07 NOTE — Progress Notes (Signed)
Progress Note  Patient Name: Anthony Byrd Date of Encounter: 07/07/2020  [facility] Cardiologist: Buford Dresser, MD   Subjective   Patient diuresed -5.2L overnight, weight down 4 lbs. Creatinine improved. Patient is still in afib with heart rates around 100. Plan for TEE/DCCV today. Patient denies chest pain.   Inpatient Medications    Scheduled Meds: . metoprolol tartrate  100 mg Oral Q6H  . metoprolol tartrate  50 mg Oral Once  . rivaroxaban  20 mg Oral Q supper   Continuous Infusions: . cefTRIAXone (ROCEPHIN)  IV Stopped (07/06/20 2200)   PRN Meds: acetaminophen, guaiFENesin-dextromethorphan, polyethylene glycol, simethicone   Vital Signs    Vitals:   07/06/20 1634 07/06/20 2028 07/07/20 0003 07/07/20 0445  BP: 133/90 130/85 119/70 109/81  Pulse: (!) 5 (!) 117 (!) 101 (!) 103  Resp:  17 17 18   Temp: 98.5 F (36.9 C) 98 F (36.7 C) 98.2 F (36.8 C) 98.1 F (36.7 C)  TempSrc: Oral Oral Oral Oral  SpO2: 100% 98% 96% 98%  Weight:    118.3 kg  Height:        Intake/Output Summary (Last 24 hours) at 07/07/2020 0752 Last data filed at 07/07/2020 0536 Gross per 24 hour  Intake 970 ml  Output 6250 ml  Net -5280 ml   Last 3 Weights 07/07/2020 07/05/2020 07/04/2020  Weight (lbs) 260 lb 11.2 oz 276 lb 8 oz 278 lb 3.2 oz  Weight (kg) 118.253 kg 125.42 kg 126.19 kg      Telemetry    Afib HR around 100 - Personally Reviewed  ECG    No new - Personally Reviewed  Physical Exam   GEN: No acute distress.   Neck: No JVD Cardiac: Irreg Irreg, no murmurs, rubs, or gallops.  Respiratory: Clear to auscultation bilaterally. GI: Soft, nontender, non-distended  MS: mild edema; No deformity. Neuro:  Nonfocal  Psych: Normal affect   Labs    High Sensitivity Troponin:   Recent Labs  Lab 07/02/20 0246 07/02/20 0439  TROPONINIHS 22* 24*      Chemistry Recent Labs  Lab 07/03/20 0352 07/04/20 0936 07/04/20 1440 07/05/20 0325 07/06/20 0338  07/07/20 0642  NA 136   < >  --  136 138 139  K 3.5   < >  --  3.6 4.1 3.9  CL 97*   < >  --  100 99 101  CO2 28   < >  --  23 28 27   GLUCOSE 118*   < >  --  119* 121* 90  BUN 29*   < >  --  47* 41* 33*  CREATININE 1.29*   < >  --  1.70* 1.49* 1.13  CALCIUM 8.5*   < >  --  8.4* 8.9 9.0  PROT 6.7  --  6.7  --   --   --   ALBUMIN 3.2*  --  3.2*  --   --   --   AST 37  --  56*  --   --   --   ALT 20  --  40  --   --   --   ALKPHOS 57  --  52  --   --   --   BILITOT 2.5*  --  2.4*  --   --   --   GFRNONAA >60   < >  --  46* 54* >60  GFRAA >60   < >  --  53* >60 >60  ANIONGAP 11   < >  --  13 11 11    < > = values in this interval not displayed.     Hematology Recent Labs  Lab 07/05/20 0325 07/06/20 0338 07/07/20 0642  WBC 11.1* 9.7 8.1  RBC 5.28 5.31 5.54  HGB 14.7 14.8 15.2  HCT 45.8 46.9 48.6  MCV 86.7 88.3 87.7  MCH 27.8 27.9 27.4  MCHC 32.1 31.6 31.3  RDW 15.3 15.2 15.1  PLT 224 224 237    BNP Recent Labs  Lab 07/02/20 0246  BNP 106.3*     DDimer No results for input(s): DDIMER in the last 168 hours.   Radiology    No results found.  Cardiac Studies   TTE 07/02/2020 1. Left ventricular ejection fraction, by estimation, is 55 to 60%. The  left ventricle has normal function. The left ventricle has no regional  wall motion abnormalities. There is moderate concentric left ventricular  hypertrophy. Left ventricular  diastolic function could not be evaluated.  2. Right ventricular systolic function is mildly reduced. The right  ventricular size is mildly enlarged. There is moderately elevated  pulmonary artery systolic pressure. The estimated right ventricular  systolic pressure is 06.3 mmHg.  3. Left atrial size was severely dilated.  4. Right atrial size was mildly dilated.  5. Central jet of moderate to severe MR (ERO area 0.3 cmsq, regurgitant  volume 37 mL, regurgitant fraction 50%). The mitral valve is degenerative.  Moderate to severe mitral  valve regurgitation. No evidence of mitral  stenosis.  6. The aortic valve is grossly normal. Aortic valve regurgitation is  trivial. Very mild aortic valve stenosis. Gradients may be underestimated.  7. The inferior vena cava is dilated in size with <50% respiratory  variability, suggesting right atrial pressure of 15 mmHg.   Patient Profile     49 y.o. male with persistent atrial fibrillation, HFpEF, obesity, OSA on CPAP who was admitted on 07/02/2020 with acute diastolic heart failure and A. fib with RVR.  Assessment & Plan    Acute on chronic diastolic CHF - IV lasix 120mg  BID restarted  - weight 275lbs>>260lbs - Overnight pt put out -5.2L, net -15L - creatinine improved 1.49>1.13 - Echo showed LVEF 55-60% - Volume status is improving however would continue with diuresis, suspect might not need much more IV diuresis.   Afib RVR - in the setting of acute CHF - metoprolol for rate control - Echo with severely dilated LA - Xarelto - TEE/DCCV today  Mod to severe MR - TEE today  OSA - noncompliance with CPAP  AKI - creatinine improving with diuresis   For questions or updates, please contact [facility] Please consult [email or web address] for contact info under        Signed, Noriel Guthrie Ninfa Meeker, PA-C  07/07/2020, 7:52 AM

## 2020-07-07 NOTE — Anesthesia Preprocedure Evaluation (Addendum)
Anesthesia Evaluation  Patient identified by MRN, date of birth, ID band Patient awake    Reviewed: Allergy & Precautions, H&P , NPO status , Patient's Chart, lab work & pertinent test results  Airway Mallampati: III  TM Distance: >3 FB Neck ROM: Full    Dental no notable dental hx. (+) Teeth Intact, Dental Advisory Given   Pulmonary sleep apnea and Continuous Positive Airway Pressure Ventilation ,    Pulmonary exam normal breath sounds clear to auscultation       Cardiovascular Exercise Tolerance: Good +CHF  + dysrhythmias Atrial Fibrillation + Valvular Problems/Murmurs MR  Rhythm:Irregular Rate:Tachycardia     Neuro/Psych negative neurological ROS  negative psych ROS   GI/Hepatic negative GI ROS, Neg liver ROS,   Endo/Other  Morbid obesity  Renal/GU Renal disease  negative genitourinary   Musculoskeletal   Abdominal   Peds  Hematology negative hematology ROS (+)   Anesthesia Other Findings   Reproductive/Obstetrics negative OB ROS                            Anesthesia Physical Anesthesia Plan  ASA: III  Anesthesia Plan: General   Post-op Pain Management:    Induction: Intravenous  PONV Risk Score and Plan: 2 and Propofol infusion and Treatment may vary due to age or medical condition  Airway Management Planned: Nasal Cannula  Additional Equipment:   Intra-op Plan:   Post-operative Plan:   Informed Consent: I have reviewed the patients History and Physical, chart, labs and discussed the procedure including the risks, benefits and alternatives for the proposed anesthesia with the patient or authorized representative who has indicated his/her understanding and acceptance.     Dental advisory given  Plan Discussed with: CRNA  Anesthesia Plan Comments:        Anesthesia Quick Evaluation

## 2020-07-07 NOTE — Progress Notes (Signed)
Subjective:  Hospital day 5 for Anthony Byrd, a 49 yo male with a past medical history of HFpEF, Afib with RVR, MR, OSA, and remote testicular cancer.  Patient seen and examined at the bedside this morning. He states that he is doing well overall. His shortness of breath and his cough continue to improve. Cough is still producing a white sputum. He has not required supplemental O2 and his O2 sat remains at 97% on RA while being interviewed. Patient continues to deny any chest pain or palpitations. He states that his abdominal and leg swelling have gone down as well. He does not have any pain or discomfort associated with the swelling. Patient continues to urinate well and is having no issues with this. Denies any hematuria or dysuria. Urine color has improved and is not as dark as it previously was. Discussed the plan to continue diuresis and antibiotics with the patient and he is in agreement. Patient noted that his TEE/DCCV was rescheduled for tomorrow due to the schedule being full.   Objective:  Vital signs in last 24 hours: Vitals:   07/07/20 0445 07/07/20 0836 07/07/20 1050 07/07/20 1124  BP: 109/81 (!) 120/102 111/77 (!) 118/94  Pulse: (!) 103 (!) 157 96 (!) 104  Resp: 18 18  18   Temp: 98.1 F (36.7 C) (!) 97.5 F (36.4 C)  (!) 97.5 F (36.4 C)  TempSrc: Oral Oral  Oral  SpO2: 98% 97%  93%  Weight: 118.3 kg     Height:       Weight change:   Intake/Output Summary (Last 24 hours) at 07/07/2020 1132 Last data filed at 07/07/2020 0536 Gross per 24 hour  Intake 730 ml  Output 5450 ml  Net -4720 ml   Physical Exam HENT:     Head: Normocephalic and atraumatic.  Eyes:     Pupils: Pupils are equal, round, and reactive to light.  Cardiovascular:     Rate and Rhythm: Tachycardia present. Rhythm irregular.     Heart sounds: Normal heart sounds. No murmur heard.   Pulmonary:     Breath sounds: Normal breath sounds. No wheezing, rhonchi or rales.  Abdominal:     General: There is  distension.     Palpations: Abdomen is soft.     Tenderness: There is no abdominal tenderness.  Musculoskeletal:     Right lower leg: Edema present.     Left lower leg: Edema present.     Comments: Bilateral 1+ edema up to knees  Skin:    General: Skin is warm.  Neurological:     General: No focal deficit present.     Mental Status: He is alert. Mental status is at baseline.     Assessment/Plan:  Active Problems:   OSA (obstructive sleep apnea)   Atrial fibrillation with RVR (HCC)   Acute on chronic diastolic (congestive) heart failure ([place])  1. Acute on Chronic Diastolic HF Patient still remains volume overloaded but continues to diurese well. He had 6.2 L of urine output over the past 24 hours for a net output of -15 L. Weight has decreased from 275 lbs on admission to 260 lbs today. Patient still has not required any supplemental O2 and has not been SOB.  - Continue Lasix 120 mg IV BID - I&Os and daily weights - Appreciate cardio recommendations   2. Afib with RVR Afib with RVR in the setting of acute heart failure. Patient remains in Afib with HR in the 100s-110s, but asymptomatic. Patient  was originally scheduled for TEE/DCCV today but was unable to get scheduled, is on the schedule for tomorrow morning. Diet was resumed for today, will be NPO at midnight.  - Continue metoprolol 100 q6 for rate control - Xarelto 20 qd - KCl discontinued for now, potassium level has been stable around 3.9-4.1 - TEE/DCCV tomorrow  - NPO at midnight  3. AKI Baseline Cr around 1.1. On hospital day 2 Cr jumped to 2.6 but has consistently been down-trending since. Creatinine down to 1.13 today, which is right around the patient's baseline.  - Continue diuresis - Monitor daily BMP  4. Cystitis CT renal stone study showing large right sided renal calculi without hydronephrosis, with some pericystic edema noted which is concerning for cystitis. Urine culture negative. - Day 4 of IV Ceftriaxone  2 g  - Will complete 5 day course of antibiotics tomorrow  - Outpatient urology follow up  5. OSA - CPAP qhs     LOS: 5 days   Anthony Byrd, Medical Student 07/07/2020, 11:32 AM  Pager: [phone]

## 2020-07-08 ENCOUNTER — Encounter (HOSPITAL_COMMUNITY): Payer: Self-pay | Admitting: Student in an Organized Health Care Education/Training Program

## 2020-07-08 ENCOUNTER — Inpatient Hospital Stay (HOSPITAL_COMMUNITY): Payer: Self-pay | Admitting: Anesthesiology

## 2020-07-08 ENCOUNTER — Inpatient Hospital Stay (HOSPITAL_COMMUNITY): Payer: Self-pay

## 2020-07-08 ENCOUNTER — Encounter (HOSPITAL_COMMUNITY)
Admission: EM | Disposition: A | Discharge status: Home / Self Care | Payer: Self-pay | Source: Home / Self Care | Attending: Internal Medicine

## 2020-07-08 DIAGNOSIS — I4891 Unspecified atrial fibrillation: Secondary | ICD-10-CM

## 2020-07-08 DIAGNOSIS — I340 Nonrheumatic mitral (valve) insufficiency: Secondary | ICD-10-CM

## 2020-07-08 HISTORY — PX: CARDIOVERSION: SHX1299

## 2020-07-08 HISTORY — PX: TEE WITHOUT CARDIOVERSION: SHX5443

## 2020-07-08 LAB — CBC
HCT: 47.1 % (ref 39.0–52.0)
Hemoglobin: 14.9 g/dL (ref 13.0–17.0)
MCH: 27.9 pg (ref 26.0–34.0)
MCHC: 31.6 g/dL (ref 30.0–36.0)
MCV: 88.2 fL (ref 80.0–100.0)
Platelets: 254 10*3/uL (ref 150–400)
RBC: 5.34 MIL/uL (ref 4.22–5.81)
RDW: 15 % (ref 11.5–15.5)
WBC: 7.8 10*3/uL (ref 4.0–10.5)
nRBC: 0 % (ref 0.0–0.2)

## 2020-07-08 LAB — ECHOCARDIOGRAM LIMITED
Height: 69 in
MV M vel: 3.92 m/s
MV Peak grad: 61.5 mmHg
Radius: 0.8 cm
Weight: 4171.2 oz

## 2020-07-08 LAB — BASIC METABOLIC PANEL
Anion gap: 10 (ref 5–15)
BUN: 31 mg/dL — ABNORMAL HIGH (ref 6–20)
CO2: 30 mmol/L (ref 22–32)
Calcium: 9.1 mg/dL (ref 8.9–10.3)
Chloride: 99 mmol/L (ref 98–111)
Creatinine, Ser: 1.14 mg/dL (ref 0.61–1.24)
GFR calc Af Amer: >60 mL/min (ref >60)
GFR calc non Af Amer: >60 mL/min (ref >60)
Glucose, Bld: 96 mg/dL (ref 70–99)
Potassium: 4.4 mmol/L (ref 3.5–5.1)
Sodium: 139 mmol/L (ref 135–145)

## 2020-07-08 SURGERY — ECHOCARDIOGRAM, TRANSESOPHAGEAL
Anesthesia: General

## 2020-07-08 MED ORDER — LIDOCAINE 2% (20 MG/ML) 5 ML SYRINGE
INTRAMUSCULAR | Status: DC | PRN
  Administered 2020-07-08: 50 mg via INTRAVENOUS

## 2020-07-08 MED ORDER — LACTATED RINGERS IV SOLN
INTRAVENOUS | Status: AC | PRN
  Administered 2020-07-08: 1000 mL via INTRAVENOUS

## 2020-07-08 MED ORDER — PHENYLEPHRINE 40 MCG/ML (10ML) SYRINGE FOR IV PUSH (FOR BLOOD PRESSURE SUPPORT)
PREFILLED_SYRINGE | INTRAVENOUS | Status: DC | PRN
  Administered 2020-07-08: 120 ug via INTRAVENOUS

## 2020-07-08 MED ORDER — LIP MEDEX EX OINT
TOPICAL_OINTMENT | CUTANEOUS | Status: DC | PRN
  Filled 2020-07-08: qty 7

## 2020-07-08 MED ORDER — METOPROLOL TARTRATE 100 MG PO TABS
100.0000 mg | ORAL_TABLET | Freq: Two times a day (BID) | ORAL | Status: DC
  Administered 2020-07-08 – 2020-07-10 (×4): 100 mg via ORAL
  Filled 2020-07-08 (×4): qty 1

## 2020-07-08 MED ORDER — BUTAMBEN-TETRACAINE-BENZOCAINE 2-2-14 % EX AERO
INHALATION_SPRAY | CUTANEOUS | Status: DC | PRN
  Administered 2020-07-08: 1 via TOPICAL

## 2020-07-08 MED ORDER — FUROSEMIDE 40 MG PO TABS
40.0000 mg | ORAL_TABLET | Freq: Two times a day (BID) | ORAL | Status: DC
  Administered 2020-07-08 – 2020-07-09 (×2): 40 mg via ORAL
  Filled 2020-07-08 (×2): qty 1

## 2020-07-08 MED ORDER — FUROSEMIDE 40 MG PO TABS
40.0000 mg | ORAL_TABLET | Freq: Every day | ORAL | Status: DC
  Administered 2020-07-08: 40 mg via ORAL
  Filled 2020-07-08: qty 1

## 2020-07-08 MED ORDER — PROPOFOL 500 MG/50ML IV EMUL
INTRAVENOUS | Status: DC | PRN
  Administered 2020-07-08: 100 ug/kg/min via INTRAVENOUS

## 2020-07-08 MED ORDER — PROPOFOL 10 MG/ML IV BOLUS
INTRAVENOUS | Status: DC | PRN
  Administered 2020-07-08 (×4): 20 mg via INTRAVENOUS

## 2020-07-08 NOTE — Progress Notes (Signed)
Pt states he can place himself on CPAP when ready for bed. Advised pt to notify for RT if any further assistance is needed.  

## 2020-07-08 NOTE — Anesthesia Postprocedure Evaluation (Signed)
Anesthesia Post Note  Patient: Anthony Byrd  Procedure(s) Performed: TRANSESOPHAGEAL ECHOCARDIOGRAM (TEE) (N/A ) CARDIOVERSION (N/A )     Patient location during evaluation: Endoscopy Anesthesia Type: General Level of consciousness: awake and alert Pain management: pain level controlled Vital Signs Assessment: post-procedure vital signs reviewed and stable Respiratory status: spontaneous breathing, nonlabored ventilation and respiratory function stable Cardiovascular status: blood pressure returned to baseline and stable Postop Assessment: no apparent nausea or vomiting Anesthetic complications: no   No complications documented.  Last Vitals:  Vitals:   07/08/20 0835 07/08/20 0840  BP: (!) 88/64 (!) 136/109  Pulse: 90 89  Resp: 20 18  Temp:    SpO2: (!) 86% 99%    Last Pain:  Vitals:   07/08/20 0835  TempSrc:   PainSc: 0-No pain                 Issac Moure,W. EDMOND

## 2020-07-08 NOTE — Interval H&P Note (Signed)
History and Physical Interval Note:  07/08/2020 7:28 AM  Anthony Byrd  has presented today for surgery, with the diagnosis of AFIB.  The various methods of treatment have been discussed with the patient and family. After consideration of risks, benefits and other options for treatment, the patient has consented to  Procedure(s): TRANSESOPHAGEAL ECHOCARDIOGRAM (TEE) (N/A) CARDIOVERSION (N/A) as a surgical intervention.  The patient's history has been reviewed, patient examined, no change in status, stable for surgery.  I have reviewed the patient's chart and labs.  Questions were answered to the patient's satisfaction.     Donato Heinz

## 2020-07-08 NOTE — CV Procedure (Signed)
   TRANSESOPHAGEAL ECHOCARDIOGRAM GUIDED DIRECT CURRENT CARDIOVERSION  NAME:  Anthony Byrd   MRN: [id number] DOB:  11-30-1970   ADMIT DATE: 07/02/2020  INDICATIONS: Symptomatic atrial fibrillation  PROCEDURE:   Informed consent was obtained prior to the procedure. The risks, benefits and alternatives for the procedure were discussed and the patient comprehended these risks.  Risks include, but are not limited to, cough, sore throat, vomiting, nausea, somnolence, esophageal and stomach trauma or perforation, bleeding, low blood pressure, aspiration, pneumonia, infection, trauma to the teeth and death.    After a procedural time-out, the oropharynx was anesthetized and the patient was sedated by the anesthesia service. The transesophageal probe was inserted in the esophagus and stomach without difficulty and multiple views were obtained. Anesthesia was monitored by Dr. Ola Spurr and Judeth Cornfield, CRNA.   COMPLICATIONS:    Complications: exam was ended early due to patient desaturations.  SpO2 in 70s.  Also briefly was hypotensive to SBP 60s, improved with phenylephrine injection.  FINDINGS: No LAA thrombus.  Moderate to severe MR.    CARDIOVERSION:     Indications:  Symptomatic Atrial Fibrillation  Procedure Details:  Once the TEE was complete, the patient had the defibrillator pads placed in the anterior and posterior position. Once an appropriate level of sedation was confirmed, the patient was cardioverted x 3 with 200J of biphasic synchronized energy.  Following initial DCCV, remained in AF.  Pressure was applied to anterior pad and second DCCV was attempted, but remained in AF following this.  For third cardioversion, anterior pad was repositioned and pressure applied.  Converted to sinus rhythm following third cardioversion.  The patient had normal neuro status and respiratory status post procedure with vitals stable as recorded elsewhere.  Adequate airway was maintained  throughout and vital signs monitored per protocol.  Oswaldo Milian MD [facility]  [address] [place], [place] [address] [phone]   2:10 PM

## 2020-07-08 NOTE — Transfer of Care (Signed)
Immediate Anesthesia Transfer of Care Note  Patient: Anthony Byrd  Procedure(s) Performed: TRANSESOPHAGEAL ECHOCARDIOGRAM (TEE) (N/A ) CARDIOVERSION (N/A )  Patient Location: PACU and Endoscopy Unit  Anesthesia Type:General  Level of Consciousness: patient cooperative and responds to stimulation  Airway & Oxygen Therapy: Patient Spontanous Breathing and Patient connected to face mask oxygen  Post-op Assessment: Report given to RN and Post -op Vital signs reviewed and stable  Post vital signs: Reviewed and stable  Last Vitals:  Vitals Value Taken Time  BP    Temp    Pulse    Resp    SpO2      Last Pain:  Vitals:   07/08/20 0655  TempSrc: Oral  PainSc: 0-No pain      Patients Stated Pain Goal: 0 (50/41/36 4383)  Complications: No complications documented.

## 2020-07-08 NOTE — Progress Notes (Signed)
Progress Note  Patient Name: Anthony Byrd Date of Encounter: 07/08/2020  [facility] Cardiologist: Buford Dresser, MD   Subjective   Successful TEE/DCCV today. Pt put out 3L urine overnight. Sill has some LLE on exam. No chest pain.   Inpatient Medications    Scheduled Meds: . metoprolol tartrate  100 mg Oral Q6H  . rivaroxaban  20 mg Oral Q supper   Continuous Infusions: . cefTRIAXone (ROCEPHIN)  IV 2 g (07/07/20 1709)  . furosemide Stopped (07/07/20 1930)   PRN Meds: acetaminophen, guaiFENesin-dextromethorphan, lip balm, polyethylene glycol, simethicone   Vital Signs    Vitals:   07/08/20 0655 07/08/20 0826 07/08/20 0835 07/08/20 0840  BP: 99/68 91/62 (!) 88/64 (!) 136/109  Pulse: (!) 104 84 90 89  Resp: (!) 23 (!) 38 20 18  Temp: 98.2 F (36.8 C) 97.8 F (36.6 C)    TempSrc: Oral Axillary    SpO2: 97% 98% (!) 86% 99%  Weight:      Height:        Intake/Output Summary (Last 24 hours) at 07/08/2020 0912 Last data filed at 07/08/2020 0818 Gross per 24 hour  Intake 500 ml  Output 3850 ml  Net -3350 ml   Last 3 Weights 07/07/2020 07/05/2020 07/04/2020  Weight (lbs) 260 lb 11.2 oz 276 lb 8 oz 278 lb 3.2 oz  Weight (kg) 118.253 kg 125.42 kg 126.19 kg      Telemetry    Afib>NSR - Personally Reviewed  ECG    NSR 88bpm  - Personally Reviewed  Physical Exam   GEN: No acute distress.   Neck: No JVD Cardiac: RRR, no murmurs, rubs, or gallops.  Respiratory: Clear to auscultation bilaterally. GI: Soft, nontender, non-distended  MS: pedal edema; No deformity. Neuro:  Nonfocal  Psych: Normal affect   Labs    High Sensitivity Troponin:   Recent Labs  Lab 07/02/20 0246 07/02/20 0439  TROPONINIHS 22* 24*      Chemistry Recent Labs  Lab 07/03/20 0352 07/04/20 0936 07/04/20 1440 07/05/20 0325 07/06/20 0338 07/07/20 0642 07/08/20 0432  NA 136   < >  --    < > 138 139 139  K 3.5   < >  --    < > 4.1 3.9 4.4  CL 97*   < >  --    < > 99  101 99  CO2 28   < >  --    < > 28 27 30   GLUCOSE 118*   < >  --    < > 121* 90 96  BUN 29*   < >  --    < > 41* 33* 31*  CREATININE 1.29*   < >  --    < > 1.49* 1.13 1.14  CALCIUM 8.5*   < >  --    < > 8.9 9.0 9.1  PROT 6.7  --  6.7  --   --   --   --   ALBUMIN 3.2*  --  3.2*  --   --   --   --   AST 37  --  56*  --   --   --   --   ALT 20  --  40  --   --   --   --   ALKPHOS 57  --  52  --   --   --   --   BILITOT 2.5*  --  2.4*  --   --   --   --  GFRNONAA >60   < >  --    < > 54* >60 >60  GFRAA >60   < >  --    < > >60 >60 >60  ANIONGAP 11   < >  --    < > 11 11 10    < > = values in this interval not displayed.     Hematology Recent Labs  Lab 07/06/20 0338 07/07/20 0642 07/08/20 0432  WBC 9.7 8.1 7.8  RBC 5.31 5.54 5.34  HGB 14.8 15.2 14.9  HCT 46.9 48.6 47.1  MCV 88.3 87.7 88.2  MCH 27.9 27.4 27.9  MCHC 31.6 31.3 31.6  RDW 15.2 15.1 15.0  PLT 224 237 254    BNP Recent Labs  Lab 07/02/20 0246  BNP 106.3*     DDimer No results for input(s): DDIMER in the last 168 hours.   Radiology    No results found.  Cardiac Studies   TTE 07/02/2020 1. Left ventricular ejection fraction, by estimation, is 55 to 60%. The  left ventricle has normal function. The left ventricle has no regional  wall motion abnormalities. There is moderate concentric left ventricular  hypertrophy. Left ventricular  diastolic function could not be evaluated.  2. Right ventricular systolic function is mildly reduced. The right  ventricular size is mildly enlarged. There is moderately elevated  pulmonary artery systolic pressure. The estimated right ventricular  systolic pressure is 69.6 mmHg.  3. Left atrial size was severely dilated.  4. Right atrial size was mildly dilated.  5. Central jet of moderate to severe MR (ERO area 0.3 cmsq, regurgitant  volume 37 mL, regurgitant fraction 50%). The mitral valve is degenerative.  Moderate to severe mitral valve regurgitation. No evidence  of mitral  stenosis.  6. The aortic valve is grossly normal. Aortic valve regurgitation is  trivial. Very mild aortic valve stenosis. Gradients may be underestimated.  7. The inferior vena cava is dilated in size with <50% respiratory  variability, suggesting right atrial pressure of 15 mmHg.   Patient Profile   49 y.o. male with persistent atrial fibrillation, HFpEF, obesity, OSA on CPAP who was admitted on 07/02/2020 with acute diastolic heart failure and A. fib with RVR.  Assessment & Plan    Acute on chronic diastolic CHF - Echo showed LVEF 55-60% - IV lasix 120mg  BID - weight 275lbs>>260lbs. Pending today's weight - Overnight pt put out -3.7L, net -18L - creatinine stable today - Volume status is improving however still with some LLE. Would continue diuresis  Afib RVR - in the setting of acute CHF - Successful TEE/DCCV today - Echo with severely dilated LA - Xarelto - metoprolol for rate control. Will decrease now that patient is in sinus and pressures are intermittently soft.   Mod to severe MR - TEE today  OSA - noncompliance with CPAP  AKI - creatinine stable   For questions or updates, please contact [place] Please consult [email or web address] for contact info under        Signed, Jerami Tammen Ninfa Meeker, PA-C  07/08/2020, 9:12 AM

## 2020-07-08 NOTE — Progress Notes (Addendum)
Floor rn aware of pt c/o cold sore to left upper lip, pink and slightly puffy to eye, no blister noted, also endo rn noted small amount of dried blood to mid upper back, area cleaned with chlorhexidine wipe, no active bleeding noted, safety maintained

## 2020-07-08 NOTE — Progress Notes (Signed)
  Echocardiogram Echocardiogram Transesophageal has been performed.  Anthony Byrd 07/08/2020, 8:42 AM

## 2020-07-08 NOTE — Progress Notes (Addendum)
Subjective:  Hospital day 6 for Mr. Rosa, a 49 yo male with a past medical hx of Afib with RVR, HFpEF, MR, OSA, and remote testicular cancer.  Patient seen and examined at the bedside this morning. He underwent TEE/DCCV this morning which was successful. Patient appears to be recovering well from the procedure. He states that his SOB and cough are improving. He has not needed any supplemental O2 and his O2 sat has consistently remained >92%. Abdominal and leg swelling have continued to decrease. Patient is urinating well and has had 3.8 L urine output in the last 24 hrs, net output of 18.7 L. Patient does report that he has episodes of dark urine in the morning which seem to get better as the day goes on. Denies any dysuria. Overall the patient is improving.   Objective:  Vital signs in last 24 hours: Vitals:   07/08/20 0655 07/08/20 0826 07/08/20 0835 07/08/20 0840  BP: 99/68 91/62 (!) 88/64 (!) 136/109  Pulse: (!) 104 84 90 89  Resp: (!) 23 (!) 38 20 18  Temp: 98.2 F (36.8 C) 97.8 F (36.6 C)    TempSrc: Oral Axillary    SpO2: 97% 98% (!) 86% 99%  Weight:      Height:       Weight change:   Intake/Output Summary (Last 24 hours) at 07/08/2020 1008 Last data filed at 07/08/2020 0818 Gross per 24 hour  Intake 500 ml  Output 3850 ml  Net -3350 ml   Physical Exam HENT:     Head: Normocephalic and atraumatic.  Eyes:     Pupils: Pupils are equal, round, and reactive to light.  Cardiovascular:     Rate and Rhythm: Normal rate and regular rhythm.     Heart sounds: Normal heart sounds. No murmur heard.   Pulmonary:     Effort: Pulmonary effort is normal.     Breath sounds: Normal breath sounds.  Abdominal:     Palpations: Abdomen is soft.     Tenderness: There is no abdominal tenderness.  Musculoskeletal:        General: No tenderness.     Comments: Trace bilateral lower extremity edema  Skin:    General: Skin is warm.  Neurological:     General: No focal deficit  present.     Mental Status: He is alert. Mental status is at baseline.     Assessment/Plan:  Active Problems:   OSA (obstructive sleep apnea)   Atrial fibrillation with RVR (HCC)   Acute on chronic diastolic (congestive) heart failure ([place])  1. Acute on chronic diastolic heart failure Patient's volume status continues to improve. Still seems to be slightly volume overloaded with trace bilateral lower extremity edema noted on exam. Patient had 3.8 L of urine output over the last 24 hours for a net output of 18.7 L. Weight is down 15 lbs as of yesterday (today's weight still pending). Patient remains off of supplemental O2. - PO Lasix 40 mg BID - Strict I&Os and daily weights - Appreciate cardio recs  2. Afib with RVR in the setting of acute CHF Patient had successful TEE/DCCV done this morning. Echo showed severely dilated LA. Will continue with metoprolol for rate control but dose will be decreased now that patient has returned to sinus rhythm and pressures have been softer. - Metoprolol 100 BID - Xarelto 20 qd - Will follow up as outpatient with cardio   3. AKI Patient's creatinine has been stable and remains at his  baseline, 1.14 today.  - Transition to oral diuresis - Monitor BMP  4. Cystitis Treating patient for cystitis due to large right sided renal calculi and pericystic edema noted on CT renal stone study. Patient continues to have intermittent episodes of dark urine that appears to be worse in the morning and lightens as the day goes on.  - Last day of IV Ceftriaxone 2 g (5 day course) - Follow up with urology outpatient  5. OSA  - CPAP qhs     LOS: 6 days   Dorethea Clan, Medical Student 07/08/2020, 10:08 AM  Pager: [phone]

## 2020-07-08 NOTE — Progress Notes (Signed)
Echocardiogram 2D Echocardiogram has been performed.  Oneal Deputy Anissia Wessells 07/08/2020, 3:38 PM

## 2020-07-09 ENCOUNTER — Encounter (HOSPITAL_COMMUNITY): Payer: Self-pay | Admitting: Cardiology

## 2020-07-09 ENCOUNTER — Inpatient Hospital Stay (HOSPITAL_COMMUNITY): Payer: Self-pay

## 2020-07-09 DIAGNOSIS — R05 Cough: Secondary | ICD-10-CM

## 2020-07-09 LAB — BASIC METABOLIC PANEL
Anion gap: 13 (ref 5–15)
BUN: 36 mg/dL — ABNORMAL HIGH (ref 6–20)
CO2: 25 mmol/L (ref 22–32)
Calcium: 9.3 mg/dL (ref 8.9–10.3)
Chloride: 99 mmol/L (ref 98–111)
Creatinine, Ser: 1.5 mg/dL — ABNORMAL HIGH (ref 0.61–1.24)
GFR calc Af Amer: >60 mL/min (ref >60)
GFR calc non Af Amer: 53 mL/min — ABNORMAL LOW (ref >60)
Glucose, Bld: 113 mg/dL — ABNORMAL HIGH (ref 70–99)
Potassium: 4 mmol/L (ref 3.5–5.1)
Sodium: 137 mmol/L (ref 135–145)

## 2020-07-09 LAB — CBC
HCT: 49.1 % (ref 39.0–52.0)
Hemoglobin: 15.8 g/dL (ref 13.0–17.0)
MCH: 27.7 pg (ref 26.0–34.0)
MCHC: 32.2 g/dL (ref 30.0–36.0)
MCV: 86.1 fL (ref 80.0–100.0)
Platelets: 244 10*3/uL (ref 150–400)
RBC: 5.7 MIL/uL (ref 4.22–5.81)
RDW: 14.8 % (ref 11.5–15.5)
WBC: 9.8 10*3/uL (ref 4.0–10.5)
nRBC: 0 % (ref 0.0–0.2)

## 2020-07-09 MED ORDER — ONDANSETRON HCL 4 MG/2ML IJ SOLN
4.0000 mg | Freq: Once | INTRAMUSCULAR | Status: DC | PRN
  Filled 2020-07-09 (×2): qty 2

## 2020-07-09 MED ORDER — FUROSEMIDE 10 MG/ML IJ SOLN
80.0000 mg | Freq: Four times a day (QID) | INTRAMUSCULAR | Status: AC
  Administered 2020-07-09 (×2): 80 mg via INTRAVENOUS
  Filled 2020-07-09 (×2): qty 8

## 2020-07-09 MED ORDER — APIXABAN 5 MG PO TABS
5.0000 mg | ORAL_TABLET | Freq: Two times a day (BID) | ORAL | Status: DC
  Administered 2020-07-09 – 2020-07-10 (×2): 5 mg via ORAL
  Filled 2020-07-09 (×2): qty 1

## 2020-07-09 MED ORDER — BENZONATATE 100 MG PO CAPS
200.0000 mg | ORAL_CAPSULE | Freq: Once | ORAL | Status: AC
  Administered 2020-07-09: 200 mg via ORAL
  Filled 2020-07-09: qty 2

## 2020-07-09 MED ORDER — HYDROCOD POLST-CPM POLST ER 10-8 MG/5ML PO SUER
5.0000 mL | Freq: Two times a day (BID) | ORAL | Status: DC | PRN
  Administered 2020-07-09 – 2020-07-10 (×2): 5 mL via ORAL
  Filled 2020-07-09 (×2): qty 5

## 2020-07-09 MED ORDER — FUROSEMIDE 40 MG PO TABS: 40.0000 mg | ORAL_TABLET | Freq: Every day | ORAL | Status: DC

## 2020-07-09 NOTE — Progress Notes (Signed)
Cardiology Progress Note  Patient ID: Anthony Byrd MRN: [id number] DOB: 17-Oct-1971 Date of Encounter: 07/09/2020  Primary Cardiologist: Buford Dresser, MD  Subjective   Chief Complaint: cough  HPI: Doing well today. Cough. Blood tinged sputum.  ROS:  All other ROS reviewed and negative. Pertinent positives noted in the HPI.     Inpatient Medications  Scheduled Meds: . [START ON 07/10/2020] furosemide  40 mg Oral Daily  . metoprolol tartrate  100 mg Oral BID  . rivaroxaban  20 mg Oral Q supper   Continuous Infusions: . cefTRIAXone (ROCEPHIN)  IV 2 g (07/08/20 1703)   PRN Meds: acetaminophen, guaiFENesin-dextromethorphan, lip balm, ondansetron (ZOFRAN) IV, polyethylene glycol, simethicone   Vital Signs   Vitals:   07/08/20 2118 07/08/20 2312 07/08/20 2313 07/09/20 0445  BP: 112/75 107/80 107/80 98/79  Pulse: 83 86 80 88  Resp: 18 19    Temp: 98 F (36.7 C) 97.9 F (36.6 C)  98.2 F (36.8 C)  TempSrc: Oral Oral  Oral  SpO2: 98% 97%  96%  Weight:      Height:        Intake/Output Summary (Last 24 hours) at 07/09/2020 0955 Last data filed at 07/09/2020 0154 Gross per 24 hour  Intake 200 ml  Output 1100 ml  Net -900 ml   Last 3 Weights 07/07/2020 07/05/2020 07/04/2020  Weight (lbs) 260 lb 11.2 oz 276 lb 8 oz 278 lb 3.2 oz  Weight (kg) 118.253 kg 125.42 kg 126.19 kg      Telemetry  Overnight telemetry shows SR 80s, which I personally reviewed.   ECG  The most recent ECG shows SR, which I personally reviewed.   Physical Exam   Vitals:   07/08/20 2118 07/08/20 2312 07/08/20 2313 07/09/20 0445  BP: 112/75 107/80 107/80 98/79  Pulse: 83 86 80 88  Resp: 18 19    Temp: 98 F (36.7 C) 97.9 F (36.6 C)  98.2 F (36.8 C)  TempSrc: Oral Oral  Oral  SpO2: 98% 97%  96%  Weight:      Height:         Intake/Output Summary (Last 24 hours) at 07/09/2020 0955 Last data filed at 07/09/2020 0154 Gross per 24 hour  Intake 200 ml  Output 1100 ml  Net -900 ml      Last 3 Weights 07/07/2020 07/05/2020 07/04/2020  Weight (lbs) 260 lb 11.2 oz 276 lb 8 oz 278 lb 3.2 oz  Weight (kg) 118.253 kg 125.42 kg 126.19 kg    Body mass index is 38.5 kg/m.   General: Well nourished, well developed, in no acute distress Head: Atraumatic, normal size  Eyes: PEERLA, EOMI  Neck: Supple, no JVD Endocrine: No thryomegaly Cardiac: Normal S1, S2; RRR; 2/6 HSM Lungs: Clear to auscultation bilaterally, no wheezing, rhonchi or rales  Abd: Soft, nontender, no hepatomegaly  Ext: No edema, pulses 2+ Musculoskeletal: No deformities, BUE and BLE strength normal and equal Skin: Warm and dry, no rashes   Neuro: Alert and oriented to person, place, time, and situation, CNII-XII grossly intact, no focal deficits  Psych: Normal mood and affect   Labs  High Sensitivity Troponin:   Recent Labs  Lab 07/02/20 0246 07/02/20 0439  TROPONINIHS 22* 24*     Cardiac EnzymesNo results for input(s): TROPONINI in the last 168 hours. No results for input(s): TROPIPOC in the last 168 hours.  Chemistry Recent Labs  Lab 07/03/20 0352 07/04/20 0762 07/04/20 1440 07/05/20 0325 07/06/20 2633 07/07/20 3545 07/08/20  0432  NA 136   < >  --    < > 138 139 139  K 3.5   < >  --    < > 4.1 3.9 4.4  CL 97*   < >  --    < > 99 101 99  CO2 28   < >  --    < > 28 27 30   GLUCOSE 118*   < >  --    < > 121* 90 96  BUN 29*   < >  --    < > 41* 33* 31*  CREATININE 1.29*   < >  --    < > 1.49* 1.13 1.14  CALCIUM 8.5*   < >  --    < > 8.9 9.0 9.1  PROT 6.7  --  6.7  --   --   --   --   ALBUMIN 3.2*  --  3.2*  --   --   --   --   AST 37  --  56*  --   --   --   --   ALT 20  --  40  --   --   --   --   ALKPHOS 57  --  52  --   --   --   --   BILITOT 2.5*  --  2.4*  --   --   --   --   GFRNONAA >60   < >  --    < > 54* >60 >60  GFRAA >60   < >  --    < > >60 >60 >60  ANIONGAP 11   < >  --    < > 11 11 10    < > = values in this interval not displayed.    Hematology Recent Labs  Lab  07/06/20 0338 07/07/20 0642 07/08/20 0432  WBC 9.7 8.1 7.8  RBC 5.31 5.54 5.34  HGB 14.8 15.2 14.9  HCT 46.9 48.6 47.1  MCV 88.3 87.7 88.2  MCH 27.9 27.4 27.9  MCHC 31.6 31.3 31.6  RDW 15.2 15.1 15.0  PLT 224 237 254   BNPNo results for input(s): BNP, PROBNP in the last 168 hours.  DDimer No results for input(s): DDIMER in the last 168 hours.   Radiology  ECHO TEE  Result Date: 07/08/2020    TRANSESOPHOGEAL ECHO REPORT   Patient Name:   Anthony Byrd Date of Exam: 07/08/2020 Medical Rec #:  [id number]      Height:       69.0 in Accession #:    [id number]     Weight:       260.7 lb Date of Birth:  1970/12/11      BSA:          2.312 m Patient Age:    49 years       BP:           91/78 mmHg Patient Gender: M              HR:           118 bpm. Exam Location:  Inpatient Procedure: 2D Echo and Transesophageal Echo Indications:     atrial fibrillation 427.31  History:         Patient has prior history of Echocardiogram examinations, most                  recent 07/02/2020. Risk Factors:Sleep Apnea.  Sonographer:     Johny Chess Referring Phys:  [id number] [place] Diagnosing Phys: Oswaldo Milian MD PROCEDURE: The transesophogeal probe was passed without difficulty through the esophogus of the patient. Sedation performed by different physician. The patient was monitored while under deep sedation. Anesthestetic sedation was provided intravenously by Anesthesiology: 330mg  of Propofol, 50mg  of Lidocaine. The patient developed Respiratory depression during the procedure. A direct current cardioversion was performed. IMPRESSIONS  1. Limited exam as patient developed respiratory distress during study. Hypoxia resolved with removing TEE probe and placement of oral airway  2. Left ventricular ejection fraction, by estimation, is 50 to 55%. The left ventricle has low normal function  3. Left atrial size was mildly dilated. No left atrial/left atrial appendage thrombus was detected.  4. The mitral  valve is abnormal. Restricted posterior leaflet. Moderate to severe mitral valve regurgitation. FINDINGS  Left Ventricle: Left ventricular ejection fraction, by estimation, is 50 to 55%. The left ventricle has low normal function. The left ventricle has no regional wall motion abnormalities. The left ventricular internal cavity size was normal in size. There is mild left ventricular hypertrophy. Right Ventricle: The right ventricular size is mildly enlarged. Right vetricular wall thickness was not assessed. Right ventricular systolic function is mildly reduced. Left Atrium: Left atrial size was mildly dilated. No left atrial/left atrial appendage thrombus was detected. Right Atrium: Right atrial size was mildly dilated. Pericardium: There is no evidence of pericardial effusion. Mitral Valve: The mitral valve is abnormal. Moderate to severe mitral valve regurgitation. Tricuspid Valve: The tricuspid valve is normal in structure. Tricuspid valve regurgitation not assessed. Aortic Valve: The aortic valve is tricuspid. Aortic valve regurgitation not assessed. Pulmonic Valve: The pulmonic valve was not assessed. Pulmonic valve regurgitation not assessed. Aorta: Aortic root could not be assessed. IAS/Shunts: The interatrial septum was not assessed. Oswaldo Milian MD Electronically signed by Oswaldo Milian MD Signature Date/Time: 07/08/2020/2:31:42 PM    Final (Updated)    ECHOCARDIOGRAM LIMITED  Result Date: 07/08/2020    ECHOCARDIOGRAM LIMITED REPORT   Patient Name:   Anthony Byrd Date of Exam: 07/08/2020 Medical Rec #:  [id number]      Height:       69.0 in Accession #:    [id number]     Weight:       260.7 lb Date of Birth:  1971-11-05      BSA:          2.312 m Patient Age:    60 years       BP:           100/66 mmHg Patient Gender: M              HR:           89 bpm. Exam Location:  Inpatient Procedure: Limited Echo, 3D Echo, Color Doppler and Cardiac Doppler Indications:    Mitral Regurgitation   History:        Patient has prior history of Echocardiogram examinations, most                 recent 07/08/2020. CHF, Arrythmias:Atrial Fibrillation; Risk                 Factors:Sleep Apnea.  Sonographer:    Raquel Sarna Senior RDCS Referring Phys: [id number] [place]  1. Limited echo.  2. Left ventricular ejection fraction, by estimation, is 55 to 60%. The left ventricle has normal function.  3. The right ventricular size is mildly enlarged.  4. MR is moderate, centrally directed ERO is 0.38cm2. MR volume is 35 mm. 3D images of valve show prolapse of A2 >P2 segments. . The mitral valve is abnormal. Moderate mitral valve regurgitation. FINDINGS  Left Ventricle: Left ventricular ejection fraction, by estimation, is 55 to 60%. The left ventricle has normal function. The left ventricular internal cavity size was normal in size. Right Ventricle: The right ventricular size is mildly enlarged. Left Atrium: Left atrial size was normal in size. Right Atrium: Right atrial size was normal in size. Pericardium: There is no evidence of pericardial effusion. Mitral Valve: MR is moderate, centrally directed ERO is 0.38cm2. MR volume is 35 mm. 3D images of valve show prolapse of A2 >P2 segments. The mitral valve is abnormal. There is moderate thickening of the mitral valve leaflet(s). Moderate mitral annular calcification. Moderate mitral valve regurgitation. Aorta: The aortic root is normal in size and structure. MR Peak grad:    61.5 mmHg MR Mean grad:    37.0 mmHg MR Vmax:         392.00 cm/s MR Vmean:        290.0 cm/s MR PISA:         4.02 cm MR PISA Eff ROA: 38 mm MR PISA Radius:  0.80 cm Dorris Carnes MD Electronically signed by Dorris Carnes MD Signature Date/Time: 07/08/2020/6:15:04 PM    Final     Cardiac Studies  TEE 07/08/2020 1. Limited exam as patient developed respiratory distress during study.  Hypoxia resolved with removing TEE probe and placement of oral airway  2. Left ventricular ejection  fraction, by estimation, is 50 to 55%. The  left ventricle has low normal function  3. Left atrial size was mildly dilated. No left atrial/left atrial  appendage thrombus was detected.  4. The mitral valve is abnormal. Restricted posterior leaflet. Moderate  to severe mitral valve regurgitation.   Patient Profile  Anthony Byrd is a 49 y.o. male with HFpEF, atrial fibrillation, obesity, OSA on CPAP was admitted on 07/02/2020 for acute diastolic heart failure and atrial fibrillation with RVR.  Assessment & Plan   1.  Acute on chronic diastolic heart failure -Unclear what set this off.  Likely A. fib.  MR is concerning as well. -Nonetheless, he is euvolemic.  I would continue Lasix 40 mg daily. -He is status post TEE/cardioversion.  Continue Xarelto 20 mg daily. -I would also continue his metoprolol 100 mg twice daily.  2.  Cough -Euvolemic.  I do not suspect this is fluid related.  I will order chest x-ray today. -He has no crackles but does have some rhonchi on exam.  Will defer to the primary team for work-up.  He does report some blood-tinged sputum.  CBC is pending this morning.  Anecdotally, I have seen this with Xarelto.  I have transitioned him to Eliquis.  3.  Atrial fibrillation -Status post TEE/cardioversion on 07/08/2020.  In sinus rhythm. -Transition to Eliquis today.  Continue metoprolol.  4.  Moderate to severe MR -Murmur of MR more apparent as he is in sinus rhythm.  Posterior mitral valve leaflet is restricted consistent with 3B pathology.  Appears calcified.  I suspect this is likely in the severe range.  He did not have a great transesophageal study due to hypoxia.  Limited TTE really not that helpful.  There is no prolapse.  I will plan to see him back in 4 to 6 weeks in office.  We will then consider take another look with transesophageal  study.  He will need to be intubated for his TEE due to his severe sleep apnea.  Again, we can follow this as an outpatient.  [facility] will sign off.   Medication Recommendations: Metoprolol tartrate 100 mg twice daily, Eliquis 5 mg twice daily, Lasix 40 mg daily Other recommendations (labs, testing, etc): None Follow up as an outpatient: We will arrange follow-up with me in 4 to 6 weeks  For questions or updates, please contact [place] Please consult [email or web address] for contact info under   Time Spent with Patient: I have spent a total of 15 minutes with patient reviewing hospital notes, telemetry, EKGs, labs and examining the patient as well as establishing an assessment and plan that was discussed with the patient.  > 50% of time was spent in direct patient care.    Signed, Addison Naegeli. Audie Box, [place]  07/09/2020 9:55 AM

## 2020-07-09 NOTE — Progress Notes (Addendum)
Subjective:  Hospital day 7 for Anthony Byrd, a 49 yo male with a past medical hx of Afib with RVR, HFpEF, MR, OSA, and remote testicular cancer.  Patient seen and examined at the bedside this morning. He reports that he is not doing well, as he has developed a cough that has been causing him some chest discomfort. Patient reports that he is coughing up blood tinged sputum. He also states that he gets nauseous from how hard he is coughing. Patient was placed on 1 L [place] and O2 sat has remained around 96%. He continues to diurese well and has had 1.1 L of urine output since being switched to oral diuretics. Net output -19.2 L since admission. Denies any hematuria or dysuria. Plan for discharge tomorrow.   Objective:  Vital signs in last 24 hours: Vitals:   07/08/20 2118 07/08/20 2312 07/08/20 2313 07/09/20 0445  BP: 112/75 107/80 107/80 98/79  Pulse: 83 86 80 88  Resp: 18 19    Temp: 98 F (36.7 C) 97.9 F (36.6 C)  98.2 F (36.8 C)  TempSrc: Oral Oral  Oral  SpO2: 98% 97%  96%  Weight:      Height:       Weight change:   Intake/Output Summary (Last 24 hours) at 07/09/2020 1053 Last data filed at 07/09/2020 0154 Gross per 24 hour  Intake 200 ml  Output 850 ml  Net -650 ml   Physical Exam HENT:     Head: Normocephalic and atraumatic.  Eyes:     Pupils: Pupils are equal, round, and reactive to light.  Cardiovascular:     Rate and Rhythm: Normal rate and regular rhythm.     Heart sounds: Normal heart sounds. No murmur heard.   Pulmonary:     Effort: Pulmonary effort is normal.     Breath sounds: Normal breath sounds.  Abdominal:     General: Bowel sounds are normal.     Palpations: Abdomen is soft.  Musculoskeletal:        General: Normal range of motion.     Comments: Trace bilateral lower extremity edema  Skin:    General: Skin is warm.     Capillary Refill: Capillary refill takes less than 2 seconds.  Neurological:     General: No focal deficit present.     Mental  Status: He is alert. Mental status is at baseline.     Assessment/Plan:  Active Problems:   OSA (obstructive sleep apnea)   Atrial fibrillation with RVR (HCC)   Acute on chronic diastolic (congestive) heart failure ([place])  1. Acute on chronic diastolic heart failure Patient's volume status has much improved since admission. Had 1.1 L urine output yesterday and was transitioned to PO diuretics. Net -19.2 L. Weight has not been measured in 2 days but was down 15 lbs since last weight. - Continue PO Lasix 40 daily  2. Afib with RVR in the setting of acute CHF Patient underwent successful TEE/Cardioversion x3 on hospital day 6. TEE had to be cut short due to episodes of hypoxia. Will continue on metoprolol for rate control as outpatient. Xarelto discontinued due to episodes of hemoptysis. - Metoprolol 100 BID - Eliquis 5 mg BID - Outpatient cardio follow up  3. Cough Patient developed a new cough overnight that has been causing chest discomfort. This morning, patient reports that he has been coughing up some blood. Cardio made patient aware that this could be due to the Xarelto he was taking, so  patient was switched to Eliquis today. Ordered repeat CXR to evaluate other causes of hemoptysis. CXR showed similar diffuse interstitial opacities and similar cardiomegaly when compared to previous CXR. No evidence of infection.  - Chlorpheniramine-hydrocodone (Tussionex) for cough q12 PRN  4. AKI, resolved. Patient's creatinine spiked on hospital day 2 but has since returned to his baseline of approximately 1.1.  5. Cystitis Large R sided renal calculi and pericystic edema, concerning for cystitis, noted on CT renal stone study. Patient previously was describing that his urine was darker than normal, as well. - Completed 5 day course of IV Ceftriaxone  6. OSA - CPAP qhs    LOS: 7 days   Dorethea Clan, Medical Student 07/09/2020, 10:53 AM  Pager: [phone]

## 2020-07-09 NOTE — Progress Notes (Signed)
Pt will wear cpap by himself. RT will continue to monitor.

## 2020-07-09 NOTE — TOC Benefit Eligibility Note (Signed)
Transition of Care [facility]) Benefit Eligibility Note    Patient Details  Name: Anthony Byrd MRN: [id number] Date of Birth: [birth year]   Medication/Dose: Arne Cleveland  5 MG BID                       Additional Notes: SELF-PAY  and   NO PHARMACY BENFIT ON [name] Phone Number: 07/09/2020, 2:11 PM

## 2020-07-10 DIAGNOSIS — I340 Nonrheumatic mitral (valve) insufficiency: Secondary | ICD-10-CM

## 2020-07-10 DIAGNOSIS — N210 Calculus in bladder: Secondary | ICD-10-CM

## 2020-07-10 LAB — CBC
HCT: 44.8 % (ref 39.0–52.0)
Hemoglobin: 14.4 g/dL (ref 13.0–17.0)
MCH: 28.1 pg (ref 26.0–34.0)
MCHC: 32.1 g/dL (ref 30.0–36.0)
MCV: 87.5 fL (ref 80.0–100.0)
Platelets: 226 10*3/uL (ref 150–400)
RBC: 5.12 MIL/uL (ref 4.22–5.81)
RDW: 14.7 % (ref 11.5–15.5)
WBC: 9 10*3/uL (ref 4.0–10.5)
nRBC: 0 % (ref 0.0–0.2)

## 2020-07-10 LAB — BASIC METABOLIC PANEL
Anion gap: 12 (ref 5–15)
BUN: 33 mg/dL — ABNORMAL HIGH (ref 6–20)
CO2: 29 mmol/L (ref 22–32)
Calcium: 8.8 mg/dL — ABNORMAL LOW (ref 8.9–10.3)
Chloride: 97 mmol/L — ABNORMAL LOW (ref 98–111)
Creatinine, Ser: 1.44 mg/dL — ABNORMAL HIGH (ref 0.61–1.24)
GFR calc Af Amer: >60 mL/min (ref >60)
GFR calc non Af Amer: 56 mL/min — ABNORMAL LOW (ref >60)
Glucose, Bld: 101 mg/dL — ABNORMAL HIGH (ref 70–99)
Potassium: 3.3 mmol/L — ABNORMAL LOW (ref 3.5–5.1)
Sodium: 138 mmol/L (ref 135–145)

## 2020-07-10 MED ORDER — FUROSEMIDE 80 MG PO TABS
80.0000 mg | ORAL_TABLET | Freq: Two times a day (BID) | ORAL | 0 refills | Status: DC
Start: 2020-07-10 — End: 2020-08-18

## 2020-07-10 MED ORDER — SODIUM CHLORIDE 0.9 % IV BOLUS
250.0000 mL | Freq: Once | INTRAVENOUS | Status: AC
  Administered 2020-07-10: 250 mL via INTRAVENOUS

## 2020-07-10 MED ORDER — HYDROCOD POLST-CPM POLST ER 10-8 MG/5ML PO SUER
5.0000 mL | Freq: Two times a day (BID) | ORAL | 0 refills | Status: DC | PRN
Start: 2020-07-10 — End: 2021-09-21

## 2020-07-10 MED ORDER — FUROSEMIDE 10 MG/ML IJ SOLN
80.0000 mg | Freq: Four times a day (QID) | INTRAMUSCULAR | Status: DC
  Administered 2020-07-10: 80 mg via INTRAVENOUS
  Filled 2020-07-10: qty 8

## 2020-07-10 MED ORDER — APIXABAN 5 MG PO TABS
5.0000 mg | ORAL_TABLET | Freq: Two times a day (BID) | ORAL | 0 refills | Status: DC
Start: 2020-07-10 — End: 2020-08-17

## 2020-07-10 MED ORDER — METOPROLOL TARTRATE 100 MG PO TABS
100.0000 mg | ORAL_TABLET | Freq: Two times a day (BID) | ORAL | 0 refills | Status: DC
Start: 2020-07-10 — End: 2021-01-28

## 2020-07-10 MED ORDER — POTASSIUM CHLORIDE CRYS ER 20 MEQ PO TBCR
40.0000 meq | EXTENDED_RELEASE_TABLET | ORAL | Status: AC
  Administered 2020-07-10 (×2): 40 meq via ORAL
  Filled 2020-07-10 (×2): qty 2

## 2020-07-10 NOTE — Discharge Instructions (Signed)
Cough, Adult A cough helps to clear your throat and lungs. A cough may be a sign of an illness or another medical condition. An acute cough may only last 2-3 weeks, while a chronic cough may last 8 or more weeks. Many things can cause a cough. They include:  Germs (viruses or bacteria) that attack the airway.  Breathing in things that bother (irritate) your lungs.  Allergies.  Asthma.  Mucus that runs down the back of your throat (postnasal drip).  Smoking.  Acid backing up from the stomach into the tube that moves food from the mouth to the stomach (gastroesophageal reflux).  Some medicines.  Lung problems.  Other medical conditions, such as heart failure or a blood clot in the lung (pulmonary embolism). Follow these instructions at home: Medicines  Take over-the-counter and prescription medicines only as told by your doctor.  Talk with your doctor before you take medicines that stop a cough (coughsuppressants). Lifestyle   Do not smoke, and try not to be around smoke. Do not use any products that contain nicotine or tobacco, such as cigarettes, e-cigarettes, and chewing tobacco. If you need help quitting, ask your doctor.  Drink enough fluid to keep your pee (urine) pale yellow.  Avoid caffeine.  Do not drink alcohol if your doctor tells you not to drink. General instructions   Watch for any changes in your cough. Tell your doctor about them.  Always cover your mouth when you cough.  Stay away from things that make you cough, such as perfume, candles, campfire smoke, or cleaning products.  If the air is dry, use a cool mist vaporizer or humidifier in your home.  If your cough is worse at night, try using extra pillows to raise your head up higher while you sleep.  Rest as needed.  Keep all follow-up visits as told by your doctor. This is important. Contact a doctor if:  You have new symptoms.  You cough up pus.  Your cough does not get better after  2-3 weeks, or your cough gets worse.  Cough medicine does not help your cough and you are not sleeping well.  You have pain that gets worse or pain that is not helped with medicine.  You have a fever.  You are losing weight and you do not know why.  You have night sweats. Get help right away if:  You cough up blood.  You have trouble breathing.  Your heartbeat is very fast. These symptoms may be an emergency. Do not wait to see if the symptoms will go away. Get medical help right away. Call your local emergency services (911 in the [place] Do not drive yourself to the hospital. Summary  A cough helps to clear your throat and lungs. Many things can cause a cough.  Take over-the-counter and prescription medicines only as told by your doctor.  Always cover your mouth when you cough.  Contact a doctor if you have new symptoms or you have a cough that does not get better or gets worse. This information is not intended to replace advice given to you by your health care provider. Make sure you discuss any questions you have with your health care provider. Document Revised: 11/25/2018 Document Reviewed: 11/25/2018 Elsevier Patient Education  [place].   Atrial Fibrillation  Atrial fibrillation is a type of irregular or rapid heartbeat (arrhythmia). In atrial fibrillation, the top part of the heart (atria) beats in an irregular pattern. This makes the heart unable  to pump blood normally and effectively. The goal of treatment is to prevent blood clots from forming, control your heart rate, or restore your heartbeat to a normal rhythm. If this condition is not treated, it can cause serious problems, such as a weakened heart muscle (cardiomyopathy) or a stroke. What are the causes? This condition is often caused by medical conditions that damage the heart's electrical system. These include:  High blood pressure (hypertension). This is the most common cause.  Certain heart  problems or conditions, such as heart failure, coronary artery disease, heart valve problems, or heart surgery.  Diabetes.  Overactive thyroid (hyperthyroidism).  Obesity.  Chronic kidney disease. In some cases, the cause of this condition is not known. What increases the risk? This condition is more likely to develop in:  Older people.  People who smoke.  Athletes who do endurance exercise.  People who have a family history of atrial fibrillation.  Men.  People who use drugs.  People who drink a lot of alcohol.  People who have lung conditions, such as emphysema, pneumonia, or COPD.  People who have obstructive sleep apnea. What are the signs or symptoms? Symptoms of this condition include:  A feeling that your heart is racing or beating irregularly.  Discomfort or pain in your chest.  Shortness of breath.  Sudden light-headedness or weakness.  Tiring easily during exercise or activity.  Fatigue.  Syncope (fainting).  Sweating. In some cases, there are no symptoms. How is this diagnosed? Your health care provider may detect atrial fibrillation when taking your pulse. If detected, this condition may be diagnosed with:  An electrocardiogram (ECG) to check electrical signals of the heart.  An ambulatory cardiac monitor to record your heart's activity for a few days.  A transthoracic echocardiogram (TTE) to create pictures of your heart.  A transesophageal echocardiogram (TEE) to create even closer pictures of your heart.  A stress test to check your blood supply while you exercise.  Imaging tests, such as a CT scan or chest X-ray.  Blood tests. How is this treated? Treatment depends on underlying conditions and how you feel when you experience atrial fibrillation. This condition may be treated with:  Medicines to prevent blood clots or to treat heart rate or heart rhythm problems.  Electrical cardioversion to reset the heart's rhythm.  A pacemaker  to correct abnormal heart rhythm.  Ablation to remove the heart tissue that sends abnormal signals.  Left atrial appendage closure to seal the area where blood clots can form. In some cases, underlying conditions will be treated. Follow these instructions at home: Medicines  Take over-the counter and prescription medicines only as told by your health care provider.  Do not take any new medicines without talking to your health care provider.  If you are taking blood thinners: ? Talk with your health care provider before you take any medicines that contain aspirin or NSAIDs, such as ibuprofen. These medicines increase your risk for dangerous bleeding. ? Take your medicine exactly as told, at the same time every day. ? Avoid activities that could cause injury or bruising, and follow instructions about how to prevent falls. ? Wear a medical alert bracelet or carry a card that lists what medicines you take. Lifestyle      Do not use any products that contain nicotine or tobacco, such as cigarettes, e-cigarettes, and chewing tobacco. If you need help quitting, ask your health care provider.  Eat heart-healthy foods. Talk with a dietitian to make an  eating plan that is right for you.  Exercise regularly as told by your health care provider.  Do not drink alcohol.  Lose weight if you are overweight.  Do not use drugs, including cannabis. General instructions  If you have obstructive sleep apnea, manage your condition as told by your health care provider.  Do not use diet pills unless your health care provider approves. Diet pills can make heart problems worse.  Keep all follow-up visits as told by your health care provider. This is important. Contact a health care provider if you:  Notice a change in the rate, rhythm, or strength of your heartbeat.  Are taking a blood thinner and you notice more bruising.  Tire more easily when you exercise or do heavy work.  Have a sudden  change in weight. Get help right away if you have:   Chest pain, abdominal pain, sweating, or weakness.  Trouble breathing.  Side effects of blood thinners, such as blood in your vomit, stool, or urine, or bleeding that cannot stop.  Any symptoms of a stroke. "BE FAST" is an easy way to remember the main warning signs of a stroke: ? B - Balance. Signs are dizziness, sudden trouble walking, or loss of balance. ? E - Eyes. Signs are trouble seeing or a sudden change in vision. ? F - Face. Signs are sudden weakness or numbness of the face, or the face or eyelid drooping on one side. ? A - Arms. Signs are weakness or numbness in an arm. This happens suddenly and usually on one side of the body. ? S - Speech. Signs are sudden trouble speaking, slurred speech, or trouble understanding what people say. ? T - Time. Time to call emergency services. Write down what time symptoms started.  Other signs of a stroke, such as: ? A sudden, severe headache with no known cause. ? Nausea or vomiting. ? Seizure. These symptoms may represent a serious problem that is an emergency. Do not wait to see if the symptoms will go away. Get medical help right away. Call your local emergency services (911 in the [place] Do not drive yourself to the hospital. Summary  Atrial fibrillation is a type of irregular or rapid heartbeat (arrhythmia).  Symptoms include a feeling that your heart is beating fast or irregularly.  You may be given medicines to prevent blood clots or to treat heart rate or heart rhythm problems.  Get help right away if you have signs or symptoms of a stroke.  Get help right away if you cannot catch your breath or have chest pain or pressure. This information is not intended to replace advice given to you by your health care provider. Make sure you discuss any questions you have with your health care provider. Document Revised: 04/30/2019 Document Reviewed: 04/30/2019 Elsevier Patient  Education  [place].   Acute Kidney Injury, Adult  Acute kidney injury is a sudden worsening of kidney function. The kidneys are organs that have several jobs. They filter the blood to remove waste products and extra fluid. They also maintain a healthy balance of minerals and hormones in the body, which helps control blood pressure and keep bones strong. With this condition, your kidneys do not do their jobs as well as they should. This condition ranges from mild to severe. Over time it may develop into long-lasting (chronic) kidney disease. Early detection and treatment may prevent acute kidney injury from developing into a chronic condition. What are the causes? Common causes  of this condition include:  A problem with blood flow to the kidneys. This may be caused by: ? Low blood pressure (hypotension) or shock. ? Blood loss. ? Heart and blood vessel (cardiovascular) disease. ? Severe burns. ? Liver disease.  Direct damage to the kidneys. This may be caused by: ? Certain medicines. ? A kidney infection. ? Poisoning. ? Being around or in contact with toxic substances. ? A surgical wound. ? A hard, direct hit to the kidney area.  A sudden blockage of urine flow. This may be caused by: ? Cancer. ? Kidney stones. ? An enlarged prostate in males. What are the signs or symptoms? Symptoms of this condition may not be obvious until the condition becomes severe. Symptoms of this condition can include:  Tiredness (lethargy), or difficulty staying awake.  Nausea or vomiting.  Swelling (edema) of the face, legs, ankles, or feet.  Problems with urination, such as: ? Abdominal pain, or pain along the side of your stomach (flank). ? Decreased urine production. ? Decrease in the force of urine flow.  Muscle twitches and cramps, especially in the legs.  Confusion or trouble concentrating.  Loss of appetite.  Fever. How is this diagnosed? This condition may be diagnosed  with tests, including:  Blood tests.  Urine tests.  Imaging tests.  A test in which a sample of tissue is removed from the kidneys to be examined under a microscope (kidney biopsy). How is this treated? Treatment for this condition depends on the cause and how severe the condition is. In mild cases, treatment may not be needed. The kidneys may heal on their own. In more severe cases, treatment will involve:  Treating the cause of the kidney injury. This may involve changing any medicines you are taking or adjusting your dosage.  Fluids. You may need specialized IV fluids to balance your body's needs.  Having a catheter placed to drain urine and prevent blockages.  Preventing problems from occurring. This may mean avoiding certain medicines or procedures that can cause further injury to the kidneys. In some cases treatment may also require:  A procedure to remove toxic wastes from the body (dialysis or continuous renal replacement therapy - CRRT).  Surgery. This may be done to repair a torn kidney, or to remove the blockage from the urinary system. Follow these instructions at home: Medicines  Take over-the-counter and prescription medicines only as told by your health care provider.  Do not take any new medicines without your health care provider's approval. Many medicines can worsen your kidney damage.  Do not take any vitamin and mineral supplements without your health care provider's approval. Many nutritional supplements can worsen your kidney damage. Lifestyle  If your health care provider prescribed changes to your diet, follow them. You may need to decrease the amount of protein you eat.  Achieve and maintain a healthy weight. If you need help with this, ask your health care provider.  Start or continue an exercise plan. Try to exercise at least 30 minutes a day, 5 days a week.  Do not use any tobacco products, such as cigarettes, chewing tobacco, and e-cigarettes. If  you need help quitting, ask your health care provider. General instructions  Keep track of your blood pressure. Report changes in your blood pressure as told by your health care provider.  Stay up to date with immunizations. Ask your health care provider which immunizations you need.  Keep all follow-up visits as told by your health care provider. This is  important. Where to find more information  American Association of Kidney Patients: BombTimer.gl  National Kidney Foundation: www.kidney.[place]: https://mathis.com/  Life Options Rehabilitation Program: ? www.lifeoptions.org ? www.kidneyschool.org Contact a health care provider if:  Your symptoms get worse.  You develop new symptoms. Get help right away if:  You develop symptoms of worsening kidney disease, which include: ? Headaches. ? Abnormally dark or light skin. ? Easy bruising. ? Frequent hiccups. ? Chest pain. ? Shortness of breath. ? End of menstruation in women. ? Seizures. ? Confusion or altered mental status. ? Abdominal or back pain. ? Itchiness.  You have a fever.  Your body is producing less urine.  You have pain or bleeding when you urinate. Summary  Acute kidney injury is a sudden worsening of kidney function.  Acute kidney injury can be caused by problems with blood flow to the kidneys, direct damage to the kidneys, and sudden blockage of urine flow.  Symptoms of this condition may not be obvious until it becomes severe. Symptoms may include edema, lethargy, confusion, nausea or vomiting, and problems passing urine.  This condition can usually be diagnosed with blood tests, urine tests, and imaging tests. Sometimes a kidney biopsy is done to diagnose this condition.  Treatment for this condition often involves treating the underlying cause. It is treated with fluids, medicines, dialysis, diet changes, or surgery. This information is not intended to replace advice given to you by  your health care provider. Make sure you discuss any questions you have with your health care provider. Document Revised: 10/19/2017 Document Reviewed: 10/27/2016 Elsevier Patient Education  2020 [place] on my medicine - ELIQUIS (apixaban)  This medication education was reviewed with me or my healthcare representative as part of my discharge preparation.  The pharmacist that spoke with me during my hospital stay was:  Einar Grad, [facility]  Why was Eliquis prescribed for you? Eliquis was prescribed for you to reduce the risk of a blood clot forming that can cause a stroke if you have a medical condition called atrial fibrillation (a type of irregular heartbeat).  What do You need to know about Eliquis ? Take your Eliquis TWICE DAILY - one tablet in the morning and one tablet in the evening with or without food. If you have difficulty swallowing the tablet whole please discuss with your pharmacist how to take the medication safely.  Take Eliquis exactly as prescribed by your doctor and DO NOT stop taking Eliquis without talking to the doctor who prescribed the medication.  Stopping may increase your risk of developing a stroke.  Refill your prescription before you run out.  After discharge, you should have regular check-up appointments with your healthcare provider that is prescribing your Eliquis.  In the future your dose may need to be changed if your kidney function or weight changes by a significant amount or as you get older.  What do you do if you miss a dose? If you miss a dose, take it as soon as you remember on the same day and resume taking twice daily.  Do not take more than one dose of ELIQUIS at the same time to make up a missed dose.  Important Safety Information A possible side effect of Eliquis is bleeding. You should call your healthcare provider right away if you experience any of the following: ? Bleeding from an injury or your nose that does not  stop. ? Unusual colored urine (red or dark brown) or unusual  colored stools (red or black). ? Unusual bruising for unknown reasons. ? A serious fall or if you hit your head (even if there is no bleeding).  Some medicines may interact with Eliquis and might increase your risk of bleeding or clotting while on Eliquis. To help avoid this, consult your healthcare provider or pharmacist prior to using any new prescription or non-prescription medications, including herbals, vitamins, non-steroidal anti-inflammatory drugs (NSAIDs) and supplements.  This website has more information on Eliquis (apixaban): http://www.eliquis.com/eliquis/home

## 2020-07-10 NOTE — Discharge Summary (Signed)
Name: Anthony Byrd MRN: [id number] DOB: [birth year] 49 y.o. PCP: Alexandria Lodge, MD  Date of Admission: 07/02/2020  2:29 AM Date of Discharge: 07/10/2020 Attending Physician: Aldine Contes  Discharge Diagnosis: 1. Acute on chronic diastolic heart failure 2. Atrial Fibrillation with Rapid Ventricular Rate 3. Acute Kidney Injury 4. Mitral valve regurgitation 5. Simple cystitis with non-obstructive nephrolithiasis  Discharge Medications: Allergies as of 07/10/2020      Reactions   Peanut-containing Drug Products Nausea And Vomiting   Pt reports tolerance to small amount of peanut now      Medication List    STOP taking these medications   diltiazem 120 MG 24 hr capsule Commonly known as: Cardizem CD   rivaroxaban 20 MG Tabs tablet Commonly known as: XARELTO     TAKE these medications   apixaban 5 MG Tabs tablet Commonly known as: ELIQUIS Take 1 tablet (5 mg total) by mouth 2 (two) times daily.   chlorpheniramine-HYDROcodone 10-8 MG/5ML Suer Commonly known as: TUSSIONEX Take 5 mLs by mouth every 12 (twelve) hours as needed for cough.   furosemide 80 MG tablet Commonly known as: LASIX Take 1 tablet (80 mg total) by mouth 2 (two) times daily. Taking 2 tablets in the am and two tablets by mouth in the pm What changed:   medication strength  how much to take  how to take this  when to take this   metoprolol tartrate 100 MG tablet Commonly known as: LOPRESSOR Take 1 tablet (100 mg total) by mouth 2 (two) times daily. What changed:   medication strength  how much to take   multivitamin with minerals tablet Take 1 tablet by mouth daily.   potassium chloride SA 20 MEQ tablet Commonly known as: Klor-Con M20 Take 1 tablet (20 mEq total) by mouth daily.       Disposition and follow-up:   Anthony Byrd was discharged from [facility] in Stable condition.  At the hospital follow up visit please address:  1.  Atrial Fibrillation with  RVR s/p cardioversion - Check HR and assess for need to up-titrate rate control (d/c w/ metoprolol tartrate 100mg  BID) - Ensure continued anti-coagulation therapy with eliquis  2. Acute on chronic diastolic heart failure - Discharged w/ furosemide 80mg  BID - Check volume status and titrate diuretic regimen as needed (discharge weight 115.3kg) - Check electrolytes  3. AKI - Creatinine at discharge stable at 1.44 - Check bmp  2.  Labs / imaging needed at time of follow-up: bmp  3.  Pending labs/ test needing follow-up: N/A  Follow-up Appointments:  Follow-up Information    O'Neal, Cassie Freer, MD Follow up on 07/30/2020.   Specialties: Internal Medicine, Cardiology, Radiology Why: @ 9AM Contact information: [place] [address] [phone]        Alexandria Lodge, MD. Call.   Specialty: Internal Medicine Contact information: [place] [address] [place] Hospital Course by problem list: 1. Acute on Chronic Diastolic Heart Failure. Patient presented with SOB and a cough secondary to a heart failure exacerbation. Prior to admission, patient was taking Lasix 80mg  BID at home for the last 4 days with increased urination. During admission, patient was diuresed with Lasix 80 IV BID and dose was increased on 8/16 to 120 BID. Net output -19.2 L. Discharged on PO Lasix 40 mg daily.  2. Afib with RVR. Patient diagnosed with Afib  with RVR in 11/2018 s/p failed cardioversion. Patient was lost to follow up and was started on Cardizem 120 BID and metoprolol 12.5 BID on 8/10 when he was seen in the Afib clinic. Started on diltiazem drip on admission which was continued for 24 hours along with PO Lopressor 50 mg q6. After dilt drip was weaned, patient continued on metoprolol 100 q6. Successful cardioversion performed on hospital day 6 and metoprolol dose decreased to 100 BID. Xarelto 20 was switched to Eliquis 5 BID after patient developed  hemoptysis. 3. Mitral Regurgitation. Patient has history of MR, no murmur noted on exam. TTE this admission showed moderate to severe MR likely worsening in the setting of volume overload. TEE showing MR that appears to be restricted [place] pathology. TEE was cut short due to hypoxia. Will follow up with cardio for TTE as outpatient.  4. AKI. Patient's baseline creatinine is around 1.1 On hospital day 2, Cr elevated to 2.61 and his diuretics were subsequently held. Cr decreased to 1.7 on hospital day 3 and diuretics were resumed at an increased dose (Lasix 120 IV BID). Creatinine returned to baseline on hospital day 5.   5. Cystitis. Patient had an episode of dark urine on hospital day 2. UA was performed, concerning for ATN. Urine culture negative. CT renal stone study was performed and showed large R sided renal calculi without hydronephrosis. Pericystic edema seem, concerning for cystitis. Ceftriaxone 2 g IV initiated on 8/15 and finished 5 day course on 8/19.   Discharge Vitals:   BP 100/74 (BP Location: Left Arm)   Pulse 80   Temp 97.7 F (36.5 C) (Oral)   Resp 17   Ht 5\' 9"  (1.753 m)   Wt 115.3 kg   SpO2 96%   BMI 37.55 kg/m   Pertinent Labs, Studies, and Procedures:  CBC Latest Ref Rng & Units 07/10/2020 07/09/2020 07/08/2020  WBC 4.0 - 10.5 K/uL 9.0 9.8 7.8  Hemoglobin 13.0 - 17.0 g/dL 14.4 15.8 14.9  Hematocrit 39 - 52 % 44.8 49.1 47.1  Platelets 150 - 400 K/uL 226 244 254    BMP Latest Ref Rng & Units 07/10/2020 07/09/2020 07/08/2020  Glucose 70 - 99 mg/dL 101(H) 113(H) 96  BUN 6 - 20 mg/dL 33(H) 36(H) 31(H)  Creatinine 0.61 - 1.24 mg/dL 1.44(H) 1.50(H) 1.14  BUN/Creat Ratio 9 - 20 - - -  Sodium 135 - 145 mmol/L 138 137 139  Potassium 3.5 - 5.1 mmol/L 3.3(L) 4.0 4.4  Chloride 98 - 111 mmol/L 97(L) 99 99  CO2 22 - 32 mmol/L 29 25 30   Calcium 8.9 - 10.3 mg/dL 8.8(L) 9.3 9.1   Transthoracic Echocardiogram IMPRESSIONS  1. Left ventricular ejection fraction, by estimation, is 55  to 60%. The  left ventricle has normal function. The left ventricle has no regional  wall motion abnormalities. There is moderate concentric left ventricular  hypertrophy. Left ventricular  diastolic function could not be evaluated.  2. Right ventricular systolic function is mildly reduced. The right  ventricular size is mildly enlarged. There is moderately elevated  pulmonary artery systolic pressure. The estimated right ventricular  systolic pressure is 40.9 mmHg.  3. Left atrial size was severely dilated.  4. Right atrial size was mildly dilated.  5. Central jet of moderate to severe MR (ERO area 0.3 cmsq, regurgitant  volume 37 mL, regurgitant fraction 50%). The mitral valve is degenerative.  Moderate to severe mitral valve regurgitation. No evidence of mitral  stenosis.  6. The aortic valve is  grossly normal. Aortic valve regurgitation is  trivial. Very mild aortic valve stenosis. Gradients may be underestimated.  7. The inferior vena cava is dilated in size with <50% respiratory  variability, suggesting right atrial pressure of 15 mmHg.   Comparison(s): Prior images unable to be directly viewed, comparison made  by report only. Mitral regurgitation is substantially worse. Estimated  systolic PA pressure is much higher.   Transesophageal echocardiogram IMPRESSIONS  1. Limited exam as patient developed respiratory distress during study.  Hypoxia resolved with removing TEE probe and placement of oral airway  2. Left ventricular ejection fraction, by estimation, is 50 to 55%. The  left ventricle has low normal function  3. Left atrial size was mildly dilated. No left atrial/left atrial  appendage thrombus was detected.  4. The mitral valve is abnormal. Restricted posterior leaflet. Moderate  to severe mitral valve regurgitation.   CT ABDOMEN AND PELVIS WITHOUT CONTRAST FINDINGS: Lower chest: Right greater than left base airspace disease. Mild cardiomegaly. Right  coronary artery atherosclerosis. Small bilateral pleural effusions, new.  Hepatobiliary: Moderate caudate lobe enlargement. Normal gallbladder. No biliary duct dilatation.  Pancreas: Normal, without mass or ductal dilatation.  Spleen: Normal in size, without focal abnormality.  Adrenals/Urinary Tract: Normal adrenal glands. Mild renal cortical thinning bilaterally. Large volume right-sided renal calculi, including within the inter/upper pole at up to the 2.1 cm on 34/3. A right renal pelvic stone measures 1.8 cm versus 1.6 cm previously.  7 mm hyperattenuating interpolar left renal lesion is incompletely characterized, most likely a hemorrhagic/proteinaceous cyst. No hydronephrosis. No hydroureter or ureteric calculi. Pericystic interstitial thickening on 78/3 is new.  Stomach/Bowel: Normal stomach, without wall thickening. Normal colon and terminal ileum.  Vascular/Lymphatic: The visualized aorta is normal in caliber. There are extensive surgical changes within the abdominal retroperitoneum, which are grossly similar and obscure the aorta and IVC on this noncontrast exam.  Porta hepatis node of 1.4 cm on 28/3 is not significantly changed.  Reproductive: Normal prostate.  Other: New small volume perihepatic ascites. A right paramidline ventral wall hernia contains fat and fluid. Possibly a small portion of small bowel without obstruction on 42/3.  Musculoskeletal: Degenerate disc disease at the lumbosacral junction.  IMPRESSION: 1. Large volume right-sided renal calculi, without hydronephrosis or distal stone. 2. New Pericystic edema, for which cystitis is a concern. 3. New small bilateral pleural effusions with bibasilar atelectasis. 4. New small volume perihepatic ascites. 5. Suspicion of cirrhosis.  Correlate with risk factors. 6. Right paramidline ventral abdominal wall hernia containing fat, fluid, and possibly minimal nonobstructive small bowel. 7.  Age advanced coronary artery atherosclerosis. Recommend assessment of coronary risk factors and consideration of medical therapy. 8.  Aortic Atherosclerosis (ICD10-I70.0).  Discharge Instructions: Discharge Instructions    (HEART FAILURE PATIENTS) Call MD:  Anytime you have any of the following symptoms: 1) 3 pound weight gain in 24 hours or 5 pounds in 1 week 2) shortness of breath, with or without a dry hacking cough 3) swelling in the hands, feet or stomach 4) if you have to sleep on extra pillows at night in order to breathe.   Complete by: As directed    Diet - low sodium heart healthy   Complete by: As directed    Discharge instructions   Complete by: As directed    Dear Anthony Byrd  You came to [place] with shortness of breath and palpitations. We have determined this was caused by heart failure and atrial fibrillation. Here are our recommendations for  you at discharge:  Please STOP your xarelto and START eliquis 5mg  twice daily Please STOP your diltiazem and START metoprolol tartrate 100mg  twice daily Please remember to take your furosemide as prescribed I have also sent Tussionex cough syrup to your pharmacy  Thank you for choosing [place]   Increase activity slowly   Complete by: As directed      Signed: Mosetta Anis, MD 07/12/2020, 10:25 AM Pager: [phone] After 5pm on weekdays and 1pm on weekends: On Call Pager: [phone]

## 2020-07-10 NOTE — TOC Transition Note (Signed)
Transition of Care [facility]) - CM/SW Discharge Note   Patient Details  Name: Anthony Byrd MRN: [id number] Date of Birth: 05-Aug-1971  Transition of Care [facility]) CM/SW Contact:  Bartholomew Crews, RN Phone Number: [phone] 07/10/2020, 2:30 PM   Clinical Narrative:     Spoke with patient at the bedside. Patient to transition home on Eliquis. Provided Free Trial card and advised to follow up with Eliquis ASAP for patient assistance. Discussed to follow up with MD for assistance with paperwork and MD signatures. Patient verbalized understanding. No further TOC needs identified at this time.   Final next level of care: Home/Self Care Barriers to Discharge: No Barriers Identified   Patient Goals and CMS Choice   CMS Medicare.gov Compare Post Acute Care list provided to:: Patient Choice offered to / list presented to : NA  Discharge Placement                       Discharge Plan and Services                DME Arranged: N/A DME Agency: NA       HH Arranged: NA HH Agency: NA        Social Determinants of Health (SDOH) Interventions     Readmission Risk Interventions No flowsheet data found.

## 2020-07-10 NOTE — Progress Notes (Signed)
Rechecked BP 100/74, pt reported feeling better, no light-headedness. RN updated resident, received order to DC pt home today.

## 2020-07-10 NOTE — Progress Notes (Signed)
   Subjective:  Anthony Byrd is a 49 y.o. with PMH of HFpEF, CKD, OSA, HTN, HLD admit for A.Fib w/ RVR on hospital day 8  Anthony Byrd was examined and evaluated at bedside this am. He mentions having significant improvement in his cough overnight and states he feels well. Discussed plan to discharge home today. Anthony Byrd expressed understanding.  Objective:  Vital signs in last 24 hours: Vitals:   07/09/20 2116 07/09/20 2304 07/10/20 0441 07/10/20 0852  BP:  101/63 103/72 94/80  Pulse: 89 78 83   Resp: 18  17   Temp: 97.7 F (36.5 C)  98.4 F (36.9 C)   TempSrc: Oral  Axillary   SpO2: 98%  100%   Weight:   115.3 kg   Height:       Gen: Well-developed, well nourished, NAD HEENT: NCAT head, hearing intact, EOMI, MMM Neck: supple, ROM intact, no JVD CV: Irregular rhythm, s1 s2 wnl Pulm: CTAB, No rales, no wheezes Extm: ROM intact, Peripheral pulses intact, Trace ankle peripheral edema Skin: Dry, Warm Neuro: AAOx3  Assessment/Plan:  Active Problems:   OSA (obstructive sleep apnea)   Atrial fibrillation with RVR (HCC)   Acute on chronic diastolic (congestive) heart failure (HCC)  Anthony Byrd is a 49 y.o. with PMH of HFpEF, CKD, OSA, HTN, HLD admit for dyspnea due to A.Fib w/ RVR and acute on chronic diastolic heart failure exacerbation  Acute on chronic diastolic heart failure Given 1 more day of IV diuresis after chest X-ray showed continued vascular congestion. Given IV furosemide 80mg  BID yesterday w/ 3L output. Cough improving. Dyspnea subjectively improved. On room air this am. K 3.3 Creatinine 1.5->1.4 Safe for discharge - Kcl 62mEq this am - D/c w/ furosemide 80mg  BID PO - F/u with cardiology - Discharge weight 115.3kg  A.Fib with RVR Cardioversion x3 on 8/19 with conversion to sinus. Overnight had irregular rhythm but review of telemetry shows frequent PACs but p waves are present. He is at higher risk of converting back to A.fib due to his OSA, MVR. Fortunately  current rate is in 80s. Denies any further episodes of hemoptysis on eliquis - C/w metoprolol 100mg  BID - C/w eliquis 5mg  BID - F/u w/ A.fib clinic  Acute Kidney Injury due to hypervolemia Transient elevation in creatinine after holding IV diuresis 2 days prior. BUN 30s, Creatinine 1.14->1.5->1.44 - F/u w/ [facility] for BMP check   DVT prophx: eliquis Diet: low salt Bowel: Miralax Code: Full  Prior to Admission Living Arrangement: Home Anticipated Discharge Location: Home Barriers to Discharge: None Dispo: Anticipated discharge in approximately today.   Mosetta Anis, MD 07/10/2020, 1:19 PM Pager: [phone] After 5pm on weekdays and 1pm on weekends: On Call Pager: [phone]

## 2020-07-10 NOTE — Progress Notes (Addendum)
Pt reported feeling light-headed, BB 88/74. RN updated resident, will hold off on DC for now.   Received order for NS bolus 250cc, will recheck BP in an hr.

## 2020-07-12 ENCOUNTER — Telehealth: Payer: Self-pay

## 2020-07-12 NOTE — Telephone Encounter (Signed)
TOC per Dr. Truman Hayward, discharge 07/10/2020 appt 07/15/2020.

## 2020-07-14 NOTE — Telephone Encounter (Signed)
Transition Care Management Follow-up Telephone Call  Date of discharge and from where: 07/10/2020 from [facility]  How have you been since you were released from the hospital? "I'm better." Denies SHOB, cough, weight gain, edema. Sleeps on one pillow  Any questions or concerns? No  Items Reviewed:  Did the pt receive and understand the discharge instructions provided? Yes   Medications obtained and verified? Yes , patient able to correctly state Eliquis 5 mg BID, Furosemide 80 mg BID, metoprolol 100 mg BID, potassium 20 mEq daily. States he spoke with Cards after d/c and was placed back on diltiazem as they thought he might be back in A-Fib. He has stopped Xarelto. Has taken 3 doses of tussionex but has not had a cough so won't take any more unless cough returns.  Any new allergies since your discharge? No   Dietary orders reviewed? Yes, low sodium, heart healthy  Do you have support at home? No , patient lives alone  Functional Questionnaire: (I = Independent and D = Dependent) ADLs: I knows to avoid straining and stop any activity that causes pain or weakness  Bathing/Dressing- I  Meal Prep- I  Eating- I  Maintaining continence- I  Transferring/Ambulation- I  Managing Meds- I  Follow up appointments reviewed:   PCP Hospital f/u appt confirmed? Yes  Scheduled to see Dr. Charleen Kirks on 07/15/2020 @ 1:45.  [place] [facility] f/u appt confirmed? Yes  Scheduled to see Dr. Audie Box on 07/16/2020 @ 0800.  Are transportation arrangements needed? No   If their condition worsens, is the pt aware to call PCP or go to the Emergency Dept.? Yes Was the patient provided with contact information for the PCP's office or ED? Yes. Also, given main hospital number [phone]) for after hours with instructions to ask for [facility] Resident on call.  Was to pt encouraged to call back with questions or concerns? Yes  L. Eugena Rhue, BSN, RN-BC

## 2020-07-15 ENCOUNTER — Encounter: Payer: Self-pay | Admitting: Internal Medicine

## 2020-07-15 ENCOUNTER — Ambulatory Visit (INDEPENDENT_AMBULATORY_CARE_PROVIDER_SITE_OTHER): Payer: Self-pay | Admitting: Internal Medicine

## 2020-07-15 ENCOUNTER — Other Ambulatory Visit: Payer: Self-pay

## 2020-07-15 VITALS — BP 99/81 | HR 97 | Temp 98.0°F | Ht 69.0 in | Wt 254.9 lb

## 2020-07-15 DIAGNOSIS — I4811 Longstanding persistent atrial fibrillation: Secondary | ICD-10-CM

## 2020-07-15 DIAGNOSIS — R251 Tremor, unspecified: Secondary | ICD-10-CM

## 2020-07-15 DIAGNOSIS — I4819 Other persistent atrial fibrillation: Secondary | ICD-10-CM

## 2020-07-15 DIAGNOSIS — I5033 Acute on chronic diastolic (congestive) heart failure: Secondary | ICD-10-CM

## 2020-07-15 NOTE — Progress Notes (Signed)
   CC: HF follow up; hospital follow up; tremor  HPI:  [name] is a 49 y.o. with a PMHx as listed below who presents to the clinic for HF follow up; hospital follow up; tremor.   Please see the Encounters tab for problem-based Assessment & Plan regarding status of patient's acute and chronic conditions.  Past Medical History:  Diagnosis Date  . Accidental marijuana overdose, initial encounter   . Atrial fibrillation with rapid ventricular response ([place]) 08/29/2019  . Chronic diastolic (congestive) heart failure ([place])    a. dx 11/2018 in context of afib.  . Gout   . Marijuana use   . Mitral regurgitation   . Morbid obesity ([place])   . Persistent atrial fibrillation ([place]) 11/2018   NEW  . Sleep apnea    USES CPAP  . Testicular cancer ([place])   . Tricuspid regurgitation    Review of Systems: Review of Systems  Constitutional: Negative for chills, fever, malaise/fatigue and weight loss.  Respiratory: Negative for shortness of breath and wheezing.   Cardiovascular: Positive for leg swelling (minimal). Negative for chest pain, palpitations and orthopnea.  Gastrointestinal: Negative for abdominal pain, diarrhea, nausea and vomiting.  Musculoskeletal: Negative for myalgias.  Neurological: Positive for tremors. Negative for dizziness, sensory change, focal weakness, weakness and headaches.   Physical Exam:  Vitals:   07/15/20 1358  BP: 99/81  Pulse: 97  Temp: 98 F (36.7 C)  TempSrc: Oral  SpO2: 96%  Weight: 254 lb 14.4 oz (115.6 kg)  Height: 5\' 9"  (1.753 m)   Physical Exam Vitals and nursing note reviewed.  Constitutional:      General: He is not in acute distress.    Appearance: He is obese.  Cardiovascular:     Rate and Rhythm: Normal rate. Rhythm irregular.     Heart sounds: No murmur heard.   Pulmonary:     Effort: Pulmonary effort is normal. No respiratory distress.     Breath sounds: No wheezing or rales.  Abdominal:     General: Bowel sounds are normal.       Palpations: Abdomen is soft.  Neurological:     General: No focal deficit present.     Mental Status: He is alert and oriented to person, place, and time.     Comments: Tremor present when patient holds up hands.  Not present with full relaxation, or with motion, including finger-to-nose and writing.  Tremor is not consistent with pill-rolling features.  No cogwheel rigidity.  5 out of 5 strength bilateral upper and lower extremity.  No myoclonus in bilateral upper and lower extremities  Psychiatric:        Mood and Affect: Mood normal.        Behavior: Behavior normal.    Assessment & Plan:   See Encounters Tab for problem based charting.  Patient discussed with Dr. Heber Gratiot

## 2020-07-15 NOTE — Patient Instructions (Addendum)
It was nice seeing you today! Thank you for choosing [facility] for your Primary Care.    Today we talked about:   1. Heart Failure: I am glad you are still at your dry weight. Continue with your current medication.   2. Atrial fibrillation: I will follow up your heart doctor's recommendations  3. Tremor: I am reassured by your physical exam that is not something dangerous right now. The best course of action is to monitor at this time. Keep a journal if it gets worse. We will recheck how your tremor is doing at the next visit.
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
## 2020-07-16 ENCOUNTER — Other Ambulatory Visit: Payer: Self-pay

## 2020-07-16 ENCOUNTER — Encounter: Payer: Self-pay | Admitting: Cardiovascular Disease

## 2020-07-16 ENCOUNTER — Ambulatory Visit (INDEPENDENT_AMBULATORY_CARE_PROVIDER_SITE_OTHER): Payer: Self-pay | Admitting: Cardiovascular Disease

## 2020-07-16 VITALS — BP 100/80 | HR 96 | Ht 69.0 in | Wt 258.0 lb

## 2020-07-16 DIAGNOSIS — I4819 Other persistent atrial fibrillation: Secondary | ICD-10-CM

## 2020-07-16 DIAGNOSIS — I5032 Chronic diastolic (congestive) heart failure: Secondary | ICD-10-CM

## 2020-07-16 DIAGNOSIS — I340 Nonrheumatic mitral (valve) insufficiency: Secondary | ICD-10-CM

## 2020-07-16 DIAGNOSIS — E669 Obesity, unspecified: Secondary | ICD-10-CM

## 2020-07-16 LAB — CBC
Hematocrit: 50.8 % (ref 37.5–51.0)
Hemoglobin: 16.8 g/dL (ref 13.0–17.7)
MCH: 28.3 pg (ref 26.6–33.0)
MCHC: 33.1 g/dL (ref 31.5–35.7)
MCV: 86 fL (ref 79–97)
Platelets: 265 10*3/uL (ref 150–450)
RBC: 5.93 x10E6/uL — ABNORMAL HIGH (ref 4.14–5.80)
RDW: 14.1 % (ref 11.6–15.4)
WBC: 7.8 10*3/uL (ref 3.4–10.8)

## 2020-07-16 LAB — COMPREHENSIVE METABOLIC PANEL
ALT: 42 IU/L (ref 0–44)
AST: 87 IU/L — ABNORMAL HIGH (ref 0–40)
Albumin/Globulin Ratio: 1.1 — ABNORMAL LOW (ref 1.2–2.2)
Albumin: 3.8 g/dL (ref 3.8–4.9)
Alkaline Phosphatase: 96 IU/L (ref 48–121)
BUN/Creatinine Ratio: 18 (ref 9–20)
BUN: 21 mg/dL (ref 6–24)
Bilirubin Total: 0.5 mg/dL (ref 0.0–1.2)
CO2: 24 mmol/L (ref 20–29)
Calcium: 9.1 mg/dL (ref 8.7–10.2)
Chloride: 99 mmol/L (ref 96–106)
Creatinine, Ser: 1.15 mg/dL (ref 0.76–1.27)
GFR calc Af Amer: 85 mL/min/{1.73_m2} (ref >59)
GFR calc non Af Amer: 73 mL/min/{1.73_m2} (ref >59)
Globulin, Total: 3.4 g/dL (ref 1.5–4.5)
Glucose: 82 mg/dL (ref 65–99)
Potassium: 4.7 mmol/L (ref 3.5–5.2)
Sodium: 141 mmol/L (ref 134–144)
Total Protein: 7.2 g/dL (ref 6.0–8.5)

## 2020-07-16 LAB — BMP8+ANION GAP
Anion Gap: 17 mmol/L (ref 10.0–18.0)
BUN/Creatinine Ratio: 21 — ABNORMAL HIGH (ref 9–20)
BUN: 24 mg/dL (ref 6–24)
CO2: 23 mmol/L (ref 20–29)
Calcium: 8.9 mg/dL (ref 8.7–10.2)
Chloride: 100 mmol/L (ref 96–106)
Creatinine, Ser: 1.13 mg/dL (ref 0.76–1.27)
GFR calc Af Amer: 87 mL/min/{1.73_m2} (ref >59)
GFR calc non Af Amer: 75 mL/min/{1.73_m2} (ref >59)
Glucose: 91 mg/dL (ref 65–99)
Potassium: 4.5 mmol/L (ref 3.5–5.2)
Sodium: 140 mmol/L (ref 134–144)

## 2020-07-16 MED ORDER — AMIODARONE HCL 200 MG PO TABS: 200.0000 mg | ORAL_TABLET | Freq: Every day | ORAL | 1 refills | Status: DC

## 2020-07-16 NOTE — Patient Instructions (Addendum)
Medication Instructions:  Start Amiodarone 400 mg twice daily for 7 days, then go to 200 mg daily after.  *If you need a refill on your cardiac medications before your next appointment, please call your pharmacy*   Lab Work: CMET, CBC today COVID TESTING- September 4th @ 12:05  [address] [place] [address] If you have labs (blood work) drawn today and your tests are completely normal, you will receive your results only by: Marland Kitchen MyChart Message (if you have MyChart) OR . A paper copy in the mail If you have any lab test that is abnormal or we need to change your treatment, we will call you to review the results.   Testing/Procedures: Your physician has requested that you have a Cardioversion. Electrical Cardioversion uses a jolt of electricity to your heart either through paddles or wired patches attached to your chest. This is a controlled, usually prescheduled, procedure. This procedure is done at the hospital and you are not awake during the procedure. You usually go home the day of the procedure. Please see the instruction sheet given to you today for more information.   Follow-Up: At [facility], you and your health needs are our priority.  As part of our continuing mission to provide you with exceptional heart care, we have created designated Provider Care Teams.  These Care Teams include your primary Cardiologist (physician) and Advanced Practice Providers (APPs -  Physician Assistants and Nurse Practitioners) who all work together to provide you with the care you need, when you need it.  We recommend signing up for the patient portal called "MyChart".  Sign up information is provided on this After Visit Summary.  MyChart is used to connect with patients for Virtual Visits (Telemedicine).  Patients are able to view lab/test results, encounter notes, upcoming appointments, etc.  Non-urgent messages can be sent to your provider as well.   To learn more about what you can do  with MyChart, go to [email or web address].    Your next appointment:   2 week(s) post Cardioversion  The format for your next appointment:   In Person  Provider:   Eleonore Chiquito, MD   Other Instructions  You are scheduled for a Cardioversion on September 7th with Dr. Audie Box.  Please arrive at the [facility] (Main Entrance A) at [facility]: [place], [place] [address] at 7 am/pm. (1 hour prior to procedure unless lab work is needed; if lab work is needed arrive 1.5 hours ahead)  DIET: Nothing to eat or drink after midnight except a sip of water with medications (see medication instructions below)  Medication Instructions: Hold Lasix day of Cardioversion   Continue your anticoagulant: Eliquis You will need to continue your anticoagulant after your procedure until you  are told by your  Provider that it is safe to stop   Labs: CBC, CMET today- covid testing September 4th at 12:05   You must have a responsible person to drive you home and stay in the waiting area during your procedure. Failure to do so could result in cancellation.  Bring your insurance cards.  *Special Note: Every effort is made to have your procedure done on time. Occasionally there are emergencies that occur at the hospital that may cause delays. Please be patient if a delay does occur.

## 2020-07-18 ENCOUNTER — Encounter: Payer: Self-pay | Admitting: Internal Medicine

## 2020-07-18 NOTE — Assessment & Plan Note (Signed)
Mr. Keisling states he has been having a fine bilateral upper extremity and lower extremity tremor that began around the time of his hospitalization and has persisted since then.  The tremor has not significantly affected his life, but he is just curious as to what is the cause.  He is still able to write, eat and drink without difficulty.  He denies any previous history of a tremor.  He denies any family history of a tremor.  Assessment/plan: On examination, patient has a fine tremor when hands are lifted to gravity, but not impressed with intention.  He has no myoclonus or acute neurological deficits.  I suspect this may be secondary to stress versus medication reaction.  He was started on chlorpheniramine for his cough which is known to occasionally cause a tremor.  His last dose was yesterday.  Tremor is not a known side effect of his diltiazem or metoprolol though.  BMP was evaluated today and does not show acute electrolyte abnormalities that would explain this, including hypo or hyper kalemia.  -Encouraged patient to keep a log of his tremor and we will reassess at next visit

## 2020-07-18 NOTE — Assessment & Plan Note (Signed)
Anthony Byrd was recently hospitalized for acute exacerbation of his HFpEF that was treated with IV Lasix.  He diuresed approximately 19 L over the course of his hospitalization.  He was discharged on 80 of Lasix twice daily and has been tolerating this well.  At this time he denies any shortness of breath, cough, orthopnea, and has noted significant improvement in his leg swelling.  Assessment/plan: On examination, patient is nearly euvolemic with trace pitting edema of his bilateral lower extremities and no crackles on pulmonary examination.  We will continue with current management.  -Lasix 80 mg twice daily -Potassium 20 mEq daily -Patient has follow-up with cardiology tomorrow

## 2020-07-18 NOTE — Assessment & Plan Note (Signed)
Anthony Byrd denies any palpitations, SOB. He he notes that his cardiologist called a few days after his recent hospitalization stating that he is back in atrial fibrillation.  He has not felt any symptoms from this.  At the time, his cardiologist, Dr. Davina Poke, instructed him to start back on diltiazem.  Assessment/plan: Patient's rhythm is irregular with a heart rate in the 80s on examination.  He is currently anticoagulated on Eliquis and on metoprolol and diltiazem for rate control.  Recommended he continue with his current management and we will follow-up Dr. Vivia Byrd recommendations from his appointment tomorrow.

## 2020-07-21 NOTE — Progress Notes (Signed)
Internal Medicine Clinic Attending  Case discussed with Dr. Basaraba  At the time of the visit.  We reviewed the resident's history and exam and pertinent patient test results.  I agree with the assessment, diagnosis, and plan of care documented in the resident's note.  

## 2020-07-23 ENCOUNTER — Telehealth: Payer: Self-pay | Admitting: *Deleted

## 2020-07-23 ENCOUNTER — Telehealth: Payer: Self-pay | Admitting: Cardiovascular Disease

## 2020-07-23 NOTE — Telephone Encounter (Signed)
Called pt - requesting abx like the one he received in the hospital. I asked pt why he needs abx - stated "I felt like I did in the hospital and it made me feel better". Informed he needs to schedule an appt; he already has an appt on 9/15; offered am appt on the 9th but stated he cannot do mornings. Told him, I will inform his doctor.

## 2020-07-23 NOTE — Telephone Encounter (Signed)
New message:   . Patient calling to get his Cardioversion done. Patient needs to reschedule apt.

## 2020-07-23 NOTE — Telephone Encounter (Signed)
Thank you :)

## 2020-07-23 NOTE — Telephone Encounter (Signed)
Patient requesting a refill on antibiotics that he was given .

## 2020-07-23 NOTE — Telephone Encounter (Signed)
I spoke with patient.  He is unable to have cardioversion done on 9/7 due to transportation problems.  He has someone who can drive him on 2/90 and is asking if cardioversion could be done this day.  Dr Audie Box is not scheduled to do cardioversions that day. Patient is fine with having another doctor do procedure.  Will forward to Dr Audie Box to see if OK to change to 9/17

## 2020-07-23 NOTE — Telephone Encounter (Signed)
Patient appointment was removed. Thanks!

## 2020-07-23 NOTE — Telephone Encounter (Signed)
Sure, we will get this arranged. -W

## 2020-07-24 ENCOUNTER — Other Ambulatory Visit (HOSPITAL_COMMUNITY): Payer: Self-pay

## 2020-07-27 ENCOUNTER — Ambulatory Visit (HOSPITAL_COMMUNITY): Admission: RE | Admit: 2020-07-27 | Payer: Self-pay | Source: Home / Self Care | Admitting: Cardiovascular Disease

## 2020-07-27 ENCOUNTER — Encounter (HOSPITAL_COMMUNITY): Admission: RE | Payer: Self-pay | Source: Home / Self Care

## 2020-07-27 SURGERY — CARDIOVERSION
Anesthesia: Monitor Anesthesia Care

## 2020-07-28 ENCOUNTER — Telehealth: Payer: Self-pay

## 2020-07-28 NOTE — Telephone Encounter (Signed)
Called patient to reschedule his Cardioversion on 09/17 when he has transportation.  Patient did not answer, LVM and call back number to return call to discuss times and options for COVID testing/labs.

## 2020-07-30 ENCOUNTER — Ambulatory Visit: Payer: Self-pay | Admitting: Cardiovascular Disease

## 2020-08-04 ENCOUNTER — Encounter: Payer: Self-pay | Admitting: Student

## 2020-08-06 ENCOUNTER — Telehealth: Payer: Self-pay | Admitting: Cardiovascular Disease

## 2020-08-06 NOTE — Telephone Encounter (Signed)
Patient never called back to get scheduled-  Appointment with Dr.O'Neal on 09/24- will discuss then.

## 2020-08-06 NOTE — Telephone Encounter (Signed)
New message    Calling to schedule an ablation.  Sept 28th is the best day for him if possible.

## 2020-08-11 ENCOUNTER — Other Ambulatory Visit: Payer: Self-pay

## 2020-08-11 DIAGNOSIS — I4819 Other persistent atrial fibrillation: Secondary | ICD-10-CM

## 2020-08-11 NOTE — Telephone Encounter (Signed)
Called patient, spoke in regards to scheduling the Cardioversion.   He is scheduled for 07/17/20 at 11:30, patient is aware to arrive at 10:30. He is going to call his friend to see if he can still take him on this day and time.  He is aware that blood work is needed, and the COVID test is on Sunday. I did schedule on COVID testing- and will make him aware when I call him back on Friday morning... patient asked me to call him on Friday morning.
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
## 2020-08-13 ENCOUNTER — Ambulatory Visit: Payer: Self-pay | Admitting: Cardiovascular Disease

## 2020-08-15 ENCOUNTER — Other Ambulatory Visit (HOSPITAL_COMMUNITY): Payer: Self-pay

## 2020-08-16 ENCOUNTER — Other Ambulatory Visit (HOSPITAL_COMMUNITY)
Admission: RE | Admit: 2020-08-16 | Discharge: 2020-08-16 | Disposition: A | Discharge status: Home / Self Care | Payer: HRSA Program | Source: Ambulatory Visit | Attending: Cardiovascular Disease | Admitting: Cardiovascular Disease

## 2020-08-16 ENCOUNTER — Other Ambulatory Visit: Payer: Self-pay

## 2020-08-16 DIAGNOSIS — I4819 Other persistent atrial fibrillation: Secondary | ICD-10-CM

## 2020-08-16 DIAGNOSIS — Z01812 Encounter for preprocedural laboratory examination: Secondary | ICD-10-CM | POA: Insufficient documentation

## 2020-08-16 DIAGNOSIS — Z20822 Contact with and (suspected) exposure to covid-19: Secondary | ICD-10-CM | POA: Insufficient documentation

## 2020-08-17 ENCOUNTER — Ambulatory Visit (HOSPITAL_COMMUNITY): Payer: Self-pay | Admitting: Anesthesiology

## 2020-08-17 ENCOUNTER — Encounter (HOSPITAL_COMMUNITY)
Admission: RE | Disposition: A | Discharge status: Home / Self Care | Payer: Self-pay | Source: Home / Self Care | Attending: Cardiovascular Disease

## 2020-08-17 ENCOUNTER — Ambulatory Visit (HOSPITAL_COMMUNITY)
Admission: RE | Admit: 2020-08-17 | Discharge: 2020-08-17 | Disposition: A | Discharge status: Home / Self Care | Payer: Self-pay | Attending: Cardiovascular Disease | Admitting: Cardiovascular Disease

## 2020-08-17 ENCOUNTER — Other Ambulatory Visit: Payer: Self-pay

## 2020-08-17 DIAGNOSIS — I4819 Other persistent atrial fibrillation: Secondary | ICD-10-CM | POA: Insufficient documentation

## 2020-08-17 DIAGNOSIS — Z6838 Body mass index (BMI) 38.0-38.9, adult: Secondary | ICD-10-CM | POA: Insufficient documentation

## 2020-08-17 DIAGNOSIS — M109 Gout, unspecified: Secondary | ICD-10-CM | POA: Insufficient documentation

## 2020-08-17 DIAGNOSIS — Z7901 Long term (current) use of anticoagulants: Secondary | ICD-10-CM | POA: Insufficient documentation

## 2020-08-17 DIAGNOSIS — Z79899 Other long term (current) drug therapy: Secondary | ICD-10-CM | POA: Insufficient documentation

## 2020-08-17 DIAGNOSIS — Z8547 Personal history of malignant neoplasm of testis: Secondary | ICD-10-CM | POA: Insufficient documentation

## 2020-08-17 DIAGNOSIS — I5032 Chronic diastolic (congestive) heart failure: Secondary | ICD-10-CM | POA: Insufficient documentation

## 2020-08-17 DIAGNOSIS — I081 Rheumatic disorders of both mitral and tricuspid valves: Secondary | ICD-10-CM | POA: Insufficient documentation

## 2020-08-17 DIAGNOSIS — G4733 Obstructive sleep apnea (adult) (pediatric): Secondary | ICD-10-CM | POA: Insufficient documentation

## 2020-08-17 HISTORY — PX: CARDIOVERSION: SHX1299

## 2020-08-17 LAB — POCT I-STAT, CHEM 8
BUN: 34 mg/dL — ABNORMAL HIGH (ref 6–20)
Calcium, Ion: 1.11 mmol/L — ABNORMAL LOW (ref 1.15–1.40)
Chloride: 103 mmol/L (ref 98–111)
Creatinine, Ser: 1.4 mg/dL — ABNORMAL HIGH (ref 0.61–1.24)
Glucose, Bld: 113 mg/dL — ABNORMAL HIGH (ref 70–99)
HCT: 55 % — ABNORMAL HIGH (ref 39.0–52.0)
Hemoglobin: 18.7 g/dL — ABNORMAL HIGH (ref 13.0–17.0)
Potassium: 4.1 mmol/L (ref 3.5–5.1)
Sodium: 140 mmol/L (ref 135–145)
TCO2: 26 mmol/L (ref 22–32)

## 2020-08-17 LAB — BASIC METABOLIC PANEL
BUN/Creatinine Ratio: 17 (ref 9–20)
BUN: 31 mg/dL — ABNORMAL HIGH (ref 6–24)
CO2: 26 mmol/L (ref 20–29)
Calcium: 10.2 mg/dL (ref 8.7–10.2)
Chloride: 97 mmol/L (ref 96–106)
Creatinine, Ser: 1.82 mg/dL — ABNORMAL HIGH (ref 0.76–1.27)
GFR calc Af Amer: 49 mL/min/{1.73_m2} — ABNORMAL LOW (ref >59)
GFR calc non Af Amer: 42 mL/min/{1.73_m2} — ABNORMAL LOW (ref >59)
Glucose: 95 mg/dL (ref 65–99)
Potassium: 4.2 mmol/L (ref 3.5–5.2)
Sodium: 139 mmol/L (ref 134–144)

## 2020-08-17 LAB — CBC
Hematocrit: 56.7 % — ABNORMAL HIGH (ref 37.5–51.0)
Hemoglobin: 18.8 g/dL — ABNORMAL HIGH (ref 13.0–17.7)
MCH: 28.6 pg (ref 26.6–33.0)
MCHC: 33.2 g/dL (ref 31.5–35.7)
MCV: 86 fL (ref 79–97)
Platelets: 239 10*3/uL (ref 150–450)
RBC: 6.58 x10E6/uL — ABNORMAL HIGH (ref 4.14–5.80)
RDW: 15.7 % — ABNORMAL HIGH (ref 11.6–15.4)
WBC: 8.6 10*3/uL (ref 3.4–10.8)

## 2020-08-17 LAB — SARS CORONAVIRUS 2 (TAT 6-24 HRS): SARS Coronavirus 2: NEGATIVE

## 2020-08-17 SURGERY — CARDIOVERSION
Anesthesia: General

## 2020-08-17 MED ORDER — PROPOFOL 10 MG/ML IV BOLUS
INTRAVENOUS | Status: DC | PRN
  Administered 2020-08-17: 80 mg via INTRAVENOUS
  Administered 2020-08-17: 20 mg via INTRAVENOUS

## 2020-08-17 MED ORDER — SODIUM CHLORIDE 0.9 % IV SOLN: INTRAVENOUS | Status: DC | PRN

## 2020-08-17 MED ORDER — RIVAROXABAN 20 MG PO TABS: 20.0000 mg | ORAL_TABLET | Freq: Every day | ORAL | 6 refills | Status: DC

## 2020-08-17 MED ORDER — LIDOCAINE 2% (20 MG/ML) 5 ML SYRINGE
INTRAMUSCULAR | Status: DC | PRN
  Administered 2020-08-17: 60 mg via INTRAVENOUS

## 2020-08-17 NOTE — Anesthesia Preprocedure Evaluation (Signed)
Anesthesia Evaluation  Patient identified by MRN, date of birth, ID band Patient awake    Reviewed: Allergy & Precautions, H&P , NPO status , Patient's Chart, lab work & pertinent test results  Airway Mallampati: III  TM Distance: >3 FB Neck ROM: Full    Dental no notable dental hx. (+) Teeth Intact, Dental Advisory Given   Pulmonary sleep apnea and Continuous Positive Airway Pressure Ventilation ,    Pulmonary exam normal breath sounds clear to auscultation       Cardiovascular Exercise Tolerance: Good +CHF  + dysrhythmias Atrial Fibrillation + Valvular Problems/Murmurs MR  Rhythm:Irregular Rate:Tachycardia     Neuro/Psych negative neurological ROS  negative psych ROS   GI/Hepatic negative GI ROS, (+)     substance abuse  marijuana use,   Endo/Other  Morbid obesity  Renal/GU Renal disease  negative genitourinary   Musculoskeletal   Abdominal   Peds  Hematology negative hematology ROS (+)   Anesthesia Other Findings   Reproductive/Obstetrics negative OB ROS                             Anesthesia Physical  Anesthesia Plan  ASA: III  Anesthesia Plan: General   Post-op Pain Management:    Induction: Intravenous  PONV Risk Score and Plan: 2 and Propofol infusion and Treatment may vary due to age or medical condition  Airway Management Planned: Nasal Cannula  Additional Equipment: None  Intra-op Plan:   Post-operative Plan:   Informed Consent: I have reviewed the patients History and Physical, chart, labs and discussed the procedure including the risks, benefits and alternatives for the proposed anesthesia with the patient or authorized representative who has indicated his/her understanding and acceptance.     Dental advisory given  Plan Discussed with: CRNA and Anesthesiologist  Anesthesia Plan Comments:         Anesthesia Quick Evaluation

## 2020-08-17 NOTE — Progress Notes (Signed)
Thanks Tiffany!

## 2020-08-17 NOTE — Telephone Encounter (Signed)
Patient had Cardioversion completed today 09/28

## 2020-08-17 NOTE — CV Procedure (Signed)
Electrical Cardioversion Procedure Note KELLEY KNOTH [id number] Nov 01, 1971  Procedure: Electrical Cardioversion Indications:  Atrial Fibrillation  Procedure Details Consent: Risks of procedure as well as the alternatives and risks of each were explained to the (patient/caregiver).  Consent for procedure obtained. Time Out: Verified patient identification, verified procedure, site/side was marked, verified correct patient position, special equipment/implants available, medications/allergies/relevent history reviewed, required imaging and test results available.  Performed  Patient placed on cardiac monitor, pulse oximetry, supplemental oxygen as necessary.  Sedation given: propofol Pacer pads placed anterior and posterior chest.  Cardioverted 1 time(s).  Cardioverted at [facility].  Evaluation Findings: Post procedure EKG shows: NSR Complications: None Patient did tolerate procedure well.   Skeet Latch, MD 08/17/2020, 11:35 AM

## 2020-08-17 NOTE — H&P (View-Only) (Signed)
Thanks Tiffany!

## 2020-08-17 NOTE — Discharge Instructions (Signed)
It is OK to stop Eliquis and start back taking Xarelto 20 mg every night with dinner.  If you note any more bleeding please let Dr. Audie Box know right away.    Electrical Cardioversion Electrical cardioversion is the delivery of a jolt of electricity to restore a normal rhythm to the heart. A rhythm that is too fast or is not regular keeps the heart from pumping well. In this procedure, sticky patches or metal paddles are placed on the chest to deliver electricity to the heart from a device. This procedure may be done in an emergency if:  There is low or no blood pressure as a result of the heart rhythm.  Normal rhythm must be restored as fast as possible to protect the brain and heart from further damage.  It may save a life. This may also be a scheduled procedure for irregular or fast heart rhythms that are not immediately life-threatening. Tell a health care provider about:  Any allergies you have.  All medicines you are taking, including vitamins, herbs, eye drops, creams, and over-the-counter medicines.  Any problems you or family members have had with anesthetic medicines.  Any blood disorders you have.  Any surgeries you have had.  Any medical conditions you have.  Whether you are pregnant or may be pregnant. What are the risks? Generally, this is a safe procedure. However, problems may occur, including:  Allergic reactions to medicines.  A blood clot that breaks free and travels to other parts of your body.  The possible return of an abnormal heart rhythm within hours or days after the procedure.  Your heart stopping (cardiac arrest). This is rare. What happens before the procedure? Medicines  Your health care provider may have you start taking: ? Blood-thinning medicines (anticoagulants) so your blood does not clot as easily. ? Medicines to help stabilize your heart rate and rhythm.  Ask your health care provider about: ? Changing or stopping your regular  medicines. This is especially important if you are taking diabetes medicines or blood thinners. ? Taking medicines such as aspirin and ibuprofen. These medicines can thin your blood. Do not take these medicines unless your health care provider tells you to take them. ? Taking over-the-counter medicines, vitamins, herbs, and supplements. General instructions  Follow instructions from your health care provider about eating or drinking restrictions.  Plan to have someone take you home from the hospital or clinic.  If you will be going home right after the procedure, plan to have someone with you for 24 hours.  Ask your health care provider what steps will be taken to help prevent infection. These may include washing your skin with a germ-killing soap. What happens during the procedure?   An IV will be inserted into one of your veins.  Sticky patches (electrodes) or metal paddles may be placed on your chest.  You will be given a medicine to help you relax (sedative).  An electrical shock will be delivered. The procedure may vary among health care providers and hospitals. What can I expect after the procedure?  Your blood pressure, heart rate, breathing rate, and blood oxygen level will be monitored until you leave the hospital or clinic.  Your heart rhythm will be watched to make sure it does not change.  You may have some redness on the skin where the shocks were given. Follow these instructions at home:  Do not drive for 24 hours if you were given a sedative during your procedure.  Take over-the-counter  and prescription medicines only as told by your health care provider.  Ask your health care provider how to check your pulse. Check it often.  Rest for 48 hours after the procedure or as told by your health care provider.  Avoid or limit your caffeine use as told by your health care provider.  Keep all follow-up visits as told by your health care provider. This is  important. Contact a health care provider if:  You feel like your heart is beating too quickly or your pulse is not regular.  You have a serious muscle cramp that does not go away. Get help right away if:  You have discomfort in your chest.  You are dizzy or you feel faint.  You have trouble breathing or you are short of breath.  Your speech is slurred.  You have trouble moving an arm or leg on one side of your body.  Your fingers or toes turn cold or blue. Summary  Electrical cardioversion is the delivery of a jolt of electricity to restore a normal rhythm to the heart.  This procedure may be done right away in an emergency or may be a scheduled procedure if the condition is not an emergency.  Generally, this is a safe procedure.  After the procedure, check your pulse often as told by your health care provider. This information is not intended to replace advice given to you by your health care provider. Make sure you discuss any questions you have with your health care provider. Document Revised: 06/09/2019 Document Reviewed: 06/09/2019 Elsevier Patient Education  [place].

## 2020-08-17 NOTE — Interval H&P Note (Signed)
History and Physical Interval Note:  08/17/2020 4:53 PM  Anthony Byrd  has presented today for surgery, with the diagnosis of A-FIB.  The various methods of treatment have been discussed with the patient and family. After consideration of risks, benefits and other options for treatment, the patient has consented to  Procedure(s): CARDIOVERSION (N/A) as a surgical intervention.  The patient's history has been reviewed, patient examined, no change in status, stable for surgery.  I have reviewed the patient's chart and labs.  Questions were answered to the patient's satisfaction.     Skeet Latch, MD

## 2020-08-17 NOTE — Anesthesia Procedure Notes (Signed)
Procedure Name: General with mask airway Date/Time: 08/17/2020 11:30 AM Performed by: Amadeo Garnet, CRNA Pre-anesthesia Checklist: Patient identified, Emergency Drugs available, Suction available, Patient being monitored and Timeout performed Patient Re-evaluated:Patient Re-evaluated prior to induction Oxygen Delivery Method: Ambu bag Preoxygenation: Pre-oxygenation with 100% oxygen Induction Type: IV induction Placement Confirmation: positive ETCO2 Dental Injury: Teeth and Oropharynx as per pre-operative assessment

## 2020-08-17 NOTE — Transfer of Care (Signed)
Immediate Anesthesia Transfer of Care Note  Patient: Anthony Byrd  Procedure(s) Performed: CARDIOVERSION (N/A )  Patient Location: Endoscopy Unit  Anesthesia Type:General  Level of Consciousness: awake, alert  and oriented  Airway & Oxygen Therapy: Patient Spontanous Breathing  Post-op Assessment: Report given to RN, Post -op Vital signs reviewed and stable and Patient moving all extremities  Post vital signs: Reviewed and stable  Last Vitals:  Vitals Value Taken Time  BP    Temp    Pulse    Resp    SpO2      Last Pain:  Vitals:   08/17/20 1102  TempSrc: Oral  PainSc: 0-No pain         Complications: No complications documented.

## 2020-08-17 NOTE — Anesthesia Postprocedure Evaluation (Signed)
Anesthesia Post Note  Patient: Anthony Byrd  Procedure(s) Performed: CARDIOVERSION (N/A )     Patient location during evaluation: PACU Anesthesia Type: General Level of consciousness: awake and alert Pain management: pain level controlled Vital Signs Assessment: post-procedure vital signs reviewed and stable Respiratory status: spontaneous breathing, nonlabored ventilation, respiratory function stable and patient connected to nasal cannula oxygen Cardiovascular status: blood pressure returned to baseline and stable Postop Assessment: no apparent nausea or vomiting Anesthetic complications: no   No complications documented.  Last Vitals:  Vitals:   08/17/20 1138 08/17/20 1145  BP: 108/77 111/77  Pulse: 73 73  Resp: 12 10  Temp:    SpO2: 100% 99%    Last Pain:  Vitals:   08/17/20 1145  TempSrc:   PainSc: 0-No pain                 Daylin Eads

## 2020-08-18 ENCOUNTER — Other Ambulatory Visit: Payer: Self-pay | Admitting: Internal Medicine

## 2020-08-18 ENCOUNTER — Encounter (HOSPITAL_COMMUNITY): Payer: Self-pay | Admitting: Cardiovascular Disease
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
## 2020-08-26 ENCOUNTER — Ambulatory Visit: Payer: Self-pay | Admitting: Cardiovascular Disease

## 2020-08-27 ENCOUNTER — Ambulatory Visit: Payer: Self-pay | Admitting: Cardiovascular Disease
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
## 2020-09-06 ENCOUNTER — Ambulatory Visit: Payer: Self-pay | Admitting: Cardiovascular Disease

## 2020-09-06 DIAGNOSIS — E669 Obesity, unspecified: Secondary | ICD-10-CM

## 2020-09-06 DIAGNOSIS — I4819 Other persistent atrial fibrillation: Secondary | ICD-10-CM

## 2020-09-06 DIAGNOSIS — I340 Nonrheumatic mitral (valve) insufficiency: Secondary | ICD-10-CM

## 2020-09-06 DIAGNOSIS — I5032 Chronic diastolic (congestive) heart failure: Secondary | ICD-10-CM

## 2020-10-01 ENCOUNTER — Other Ambulatory Visit: Payer: Self-pay | Admitting: *Deleted

## 2020-10-01 ENCOUNTER — Encounter: Payer: Self-pay | Admitting: *Deleted

## 2020-10-04 MED ORDER — FUROSEMIDE 80 MG PO TABS
80.0000 mg | ORAL_TABLET | Freq: Two times a day (BID) | ORAL | 2 refills | Status: DC
Start: 2020-10-04 — End: 2021-01-19

## 2020-10-04 NOTE — Telephone Encounter (Signed)
It seems that the patient has been taking 2 tablets BID (160mg  BID) rather than the plan of 80mg  BID. He states symptoms are well controlled at this time. I am changing the prescription to be the intended dose of 80mg  BID and instructed him to call for worsening symptoms. He states he is going to follow up with cardiology in early December.

## 2020-12-16 ENCOUNTER — Encounter: Payer: Self-pay | Admitting: Internal Medicine

## 2020-12-30 ENCOUNTER — Other Ambulatory Visit: Payer: Self-pay

## 2020-12-30 ENCOUNTER — Ambulatory Visit: Payer: Self-pay | Admitting: Internal Medicine

## 2020-12-30 ENCOUNTER — Encounter: Payer: Self-pay | Admitting: Internal Medicine

## 2020-12-30 DIAGNOSIS — R0602 Shortness of breath: Secondary | ICD-10-CM | POA: Insufficient documentation
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
## 2020-12-30 NOTE — Assessment & Plan Note (Signed)
Patient request a letter to support not wearing a mask in public place while working. The mask causes him difficulty breathing. 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

## 2020-12-31 NOTE — Progress Notes (Signed)
Internal Medicine Clinic Attending  Case discussed with Dr. Steen  At the time of the visit.  We reviewed the resident's history and exam and pertinent patient test results.  I agree with the assessment, diagnosis, and plan of care documented in the resident's note.  

## 2021-01-18 ENCOUNTER — Other Ambulatory Visit: Payer: Self-pay | Admitting: Student

## 2021-01-19 NOTE — Telephone Encounter (Signed)
Message sent to me in error.  Patient is requesting medication refill.  Forwarding to triage.

## 2021-01-19 NOTE — Telephone Encounter (Signed)
I sent a 30d supply to his pharmacy this morning.

## 2021-01-19 NOTE — Telephone Encounter (Signed)
Pt needs an office visit. It appears that the 2/10 visit was virtual so we need to assess his volume status and obtain labs. Will send in a 30d supply

## 2021-01-19 NOTE — Telephone Encounter (Signed)
This appointment needs to be in person as it is for diuretic management. I had mentioned it in a message earlier this morning.

## 2021-01-28 ENCOUNTER — Encounter: Payer: Self-pay | Admitting: Internal Medicine

## 2021-01-28 ENCOUNTER — Other Ambulatory Visit (HOSPITAL_COMMUNITY): Payer: Self-pay | Admitting: Internal Medicine

## 2021-01-28 ENCOUNTER — Ambulatory Visit: Payer: Self-pay | Admitting: Internal Medicine

## 2021-01-28 ENCOUNTER — Ambulatory Visit (HOSPITAL_COMMUNITY)
Admission: RE | Admit: 2021-01-28 | Discharge: 2021-01-28 | Disposition: A | Discharge status: Home / Self Care | Payer: Self-pay | Source: Ambulatory Visit | Attending: Internal Medicine | Admitting: Internal Medicine

## 2021-01-28 VITALS — BP 160/73 | HR 69 | Wt 290.0 lb

## 2021-01-28 DIAGNOSIS — R319 Hematuria, unspecified: Secondary | ICD-10-CM

## 2021-01-28 DIAGNOSIS — M25572 Pain in left ankle and joints of left foot: Secondary | ICD-10-CM

## 2021-01-28 DIAGNOSIS — I509 Heart failure, unspecified: Secondary | ICD-10-CM | POA: Insufficient documentation

## 2021-01-28 DIAGNOSIS — I4811 Longstanding persistent atrial fibrillation: Secondary | ICD-10-CM | POA: Insufficient documentation

## 2021-01-28 DIAGNOSIS — Z1211 Encounter for screening for malignant neoplasm of colon: Secondary | ICD-10-CM

## 2021-01-28 DIAGNOSIS — Z0000 Encounter for general adult medical examination without abnormal findings: Secondary | ICD-10-CM

## 2021-01-28 DIAGNOSIS — I340 Nonrheumatic mitral (valve) insufficiency: Secondary | ICD-10-CM

## 2021-01-28 DIAGNOSIS — D751 Secondary polycythemia: Secondary | ICD-10-CM

## 2021-01-28 DIAGNOSIS — I5032 Chronic diastolic (congestive) heart failure: Secondary | ICD-10-CM

## 2021-01-28 DIAGNOSIS — G4733 Obstructive sleep apnea (adult) (pediatric): Secondary | ICD-10-CM

## 2021-01-28 DIAGNOSIS — I5020 Unspecified systolic (congestive) heart failure: Secondary | ICD-10-CM | POA: Insufficient documentation

## 2021-01-28 DIAGNOSIS — M25511 Pain in right shoulder: Secondary | ICD-10-CM

## 2021-01-28 MED ORDER — FUROSEMIDE 80 MG PO TABS
80.0000 mg | ORAL_TABLET | Freq: Two times a day (BID) | ORAL | 0 refills | Status: DC
Start: 2021-01-28 — End: 2021-01-28

## 2021-01-28 MED ORDER — DILTIAZEM HCL ER COATED BEADS 120 MG PO CP24
120.0000 mg | ORAL_CAPSULE | Freq: Every day | ORAL | 2 refills | Status: DC
Start: 2021-01-28 — End: 2021-01-28

## 2021-01-28 MED ORDER — METOPROLOL TARTRATE 100 MG PO TABS
100.0000 mg | ORAL_TABLET | Freq: Two times a day (BID) | ORAL | 0 refills | Status: DC
Start: 2021-01-28 — End: 2021-12-09

## 2021-01-28 MED ORDER — METOPROLOL TARTRATE 100 MG PO TABS
100.0000 mg | ORAL_TABLET | Freq: Two times a day (BID) | ORAL | 0 refills | Status: DC
Start: 2021-01-28 — End: 2021-01-28

## 2021-01-28 MED ORDER — DILTIAZEM HCL ER COATED BEADS 120 MG PO CP24
120.0000 mg | ORAL_CAPSULE | Freq: Every day | ORAL | 3 refills | Status: DC
Start: 2021-01-28 — End: 2021-01-28

## 2021-01-28 MED ORDER — POTASSIUM CHLORIDE CRYS ER 20 MEQ PO TBCR
20.0000 meq | EXTENDED_RELEASE_TABLET | Freq: Every day | ORAL | 3 refills | Status: DC
Start: 2021-01-28 — End: 2021-01-28

## 2021-01-28 MED ORDER — FUROSEMIDE 80 MG PO TABS
80.0000 mg | ORAL_TABLET | Freq: Two times a day (BID) | ORAL | 2 refills | Status: DC
Start: 2021-01-28 — End: 2021-09-21

## 2021-01-28 MED ORDER — DILTIAZEM HCL ER COATED BEADS 120 MG PO CP24
120.0000 mg | ORAL_CAPSULE | Freq: Every day | ORAL | 2 refills | Status: DC
Start: 2021-01-28 — End: 2021-09-21

## 2021-01-28 MED ORDER — POTASSIUM CHLORIDE CRYS ER 20 MEQ PO TBCR
20.0000 meq | EXTENDED_RELEASE_TABLET | Freq: Every day | ORAL | 3 refills | Status: DC
Start: 2021-01-28 — End: 2021-12-09

## 2021-01-28 MED ORDER — FUROSEMIDE 80 MG PO TABS
80.0000 mg | ORAL_TABLET | Freq: Two times a day (BID) | ORAL | 2 refills | Status: DC
Start: 2021-01-28 — End: 2021-01-28

## 2021-01-28 MED FILL — FUROSEMIDE 80 MG TAB: 80 | 30 days supply | Qty: 60 | Fill #0

## 2021-01-28 MED FILL — POTASSIUM CHLORIDE CRYS ER: 20 | 30 days supply | Qty: 30 | Fill #0

## 2021-01-28 MED FILL — CARTIA XT 120 MG CP24: 120 | 30 days supply | Qty: 30 | Fill #0

## 2021-01-28 MED FILL — METOPROLOL TARTRATE 100 MG: 100 | 30 days supply | Qty: 60 | Fill #0

## 2021-01-28 NOTE — Assessment & Plan Note (Addendum)
Current medications: lasix 80mg  BID Follows with [facility] cardiology.  Last echocardiogram was 2021 which showed mod-severe MR (see separate problem).  Assessment: his weights are up around 40# from last year. He does not appear terribly volume overloaded on exam and is not having any symptoms related to volume overload. Plan -BMP today -continue lasix -consider starting SGLT2 at next visit -will need follow up with cardiology  -f/u in [facility] in 3 months. I reiterated the importance of routine follow ups every 3 months to monitor electrolytes and volume status

## 2021-01-28 NOTE — Assessment & Plan Note (Addendum)
Last echocardiogram was in 2021 which showed ERO of 0.38 indicating moderate, borderline severe MR.  Assessment: suspect he is heading toward needing mitral valve surgery. He is back in afib at today's visit which is likely due to MVP Plan -will defer referal to cardiothoracic surgery at this time as he will need further cardiac workup prior to surgical management -f/u with cardiology

## 2021-01-28 NOTE — Assessment & Plan Note (Addendum)
Hematuria initially noted in 2020 in the setting of renal calculi. He was referred to urology however did not follow up with them.  One of the calculi measured 2.3cm so is unlikely to pass and hematuria would likely still be present so I do not think another UA would be helpful. He is not symptomatic of nephrolithiasis today so I think it's reasonable to continue to monitor.

## 2021-01-28 NOTE — Assessment & Plan Note (Signed)
He is due for colorectal cancer screening.
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
## 2021-01-28 NOTE — Patient Instructions (Signed)
Please arrange an office visit with cardiology. I will speak to our pharmacist to work on getting you back on anticoagulation.  I would also like you to sit down with our financial counselor to work on getting you the [employer] if you qualify.

## 2021-01-28 NOTE — Assessment & Plan Note (Signed)
He reports compliance with his CPAP machine. I encouraged him to continue to use this to prevent worsening cardiac disease.
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
## 2021-01-28 NOTE — Assessment & Plan Note (Signed)
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 He notes that CBD oil is helping somewhat. Will have him try voltaren gel as well.  -if it doesn't improve significantly in the future, we can consider a joint injection

## 2021-01-28 NOTE — Assessment & Plan Note (Addendum)
Prescribed rate control: diltazem 120mg  daily, metoprolol 100mg  BID Prescribed rhythm control: amiodarone 200mg  daily Prescribed AC: xarelto 20mg  daily  He was successfully cardioverted last September. Unfortunately, he did not follow up with cardiology again after that. He reports being adherent with his rate and rhythm control agents however I question this as many would have been due for a refill months ago.  He is unable to afford the xarelto and has not taken it for several months.  Assessment: He is back in atrial fibrillation at today's visit but fortunately is rate controlled. CHADSVASc score 2 = 2.2% stroke risk per year Plan -he ultimately needs to see cardiology for this as well. 3 months worth of refills of metoprolol and diltazem sent to [facility] under IM program. Unfortunately, the program does not cover DOACs. -I will defer reordering amiodarone due to questionable adherence and suspicion that he has not taken it for awhile -I have consulted our clinical pharmacist to assess options for affordability of anticoagulation. I do not think he is a candidate for warfarin due to poor follow up

## 2021-01-29 ENCOUNTER — Encounter: Payer: Self-pay | Admitting: Internal Medicine

## 2021-02-01 ENCOUNTER — Other Ambulatory Visit: Payer: Self-pay

## 2021-02-01 NOTE — Progress Notes (Signed)
Internal Medicine Clinic Attending  Case discussed with Dr. Christian  At the time of the visit.  We reviewed the resident's history and exam and pertinent patient test results.  I agree with the assessment, diagnosis, and plan of care documented in the resident's note.  

## 2021-02-02 ENCOUNTER — Telehealth: Payer: Self-pay

## 2021-02-02 LAB — CBC
Hematocrit: 53.7 % — ABNORMAL HIGH (ref 37.5–51.0)
Hemoglobin: 18 g/dL — ABNORMAL HIGH (ref 13.0–17.7)
MCH: 29.5 pg (ref 26.6–33.0)
MCHC: 33.5 g/dL (ref 31.5–35.7)
MCV: 88 fL (ref 79–97)
Platelets: 217 10*3/uL (ref 150–450)
RBC: 6.1 x10E6/uL — ABNORMAL HIGH (ref 4.14–5.80)
RDW: 13.3 % (ref 11.6–15.4)
WBC: 8.2 10*3/uL (ref 3.4–10.8)

## 2021-02-02 LAB — BASIC METABOLIC PANEL
BUN/Creatinine Ratio: 14 (ref 9–20)
BUN: 16 mg/dL (ref 6–24)
CO2: 25 mmol/L (ref 20–29)
Calcium: 9.3 mg/dL (ref 8.7–10.2)
Chloride: 98 mmol/L (ref 96–106)
Creatinine, Ser: 1.11 mg/dL (ref 0.76–1.27)
Glucose: 92 mg/dL (ref 65–99)
Potassium: 4 mmol/L (ref 3.5–5.2)
Sodium: 138 mmol/L (ref 134–144)
eGFR: 80 mL/min/{1.73_m2} (ref >59)

## 2021-02-02 NOTE — Telephone Encounter (Signed)
Left voice message regarding possible patient assistance for one of his more expensive medications (Xarelto).   Gave patient my call back number [phone]

## 2021-02-08 ENCOUNTER — Encounter: Payer: Self-pay | Admitting: *Deleted

## 2021-03-03 ENCOUNTER — Ambulatory Visit: Payer: Self-pay

## 2021-03-09 NOTE — Addendum Note (Signed)
Addended by: Hulan Fray on: 03/09/2021 05:54 PM   Modules accepted: Orders

## 2021-04-03 ENCOUNTER — Telehealth: Payer: Self-pay | Admitting: Internal Medicine

## 2021-04-12 NOTE — Telephone Encounter (Signed)
[place] office, can you schedule pt an appt to discuss medication Thanks
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
## 2021-04-15 ENCOUNTER — Encounter: Payer: Self-pay | Admitting: Student

## 2021-04-16 ENCOUNTER — Encounter: Payer: Self-pay | Admitting: *Deleted

## 2021-04-22 ENCOUNTER — Other Ambulatory Visit (HOSPITAL_COMMUNITY): Payer: Self-pay

## 2021-04-22 MED FILL — Furosemide Tab 80 MG: ORAL | 30 days supply | Qty: 60 | Fill #0 | Status: CN

## 2021-05-02 ENCOUNTER — Other Ambulatory Visit (HOSPITAL_COMMUNITY): Payer: Self-pay

## 2021-05-24 ENCOUNTER — Encounter: Payer: Self-pay | Admitting: *Deleted

## 2021-06-18 IMAGING — DX DG CHEST 2V
3 series · 3 of 3 positions shown · non-contrast
Comparison: Chest radiographs 06/28/2020 and earlier.

CLINICAL DATA: 49-year-old male with shortness of breath, atrial
fibrillation.

EXAM:
CHEST - 2 VIEW

[chest lat]
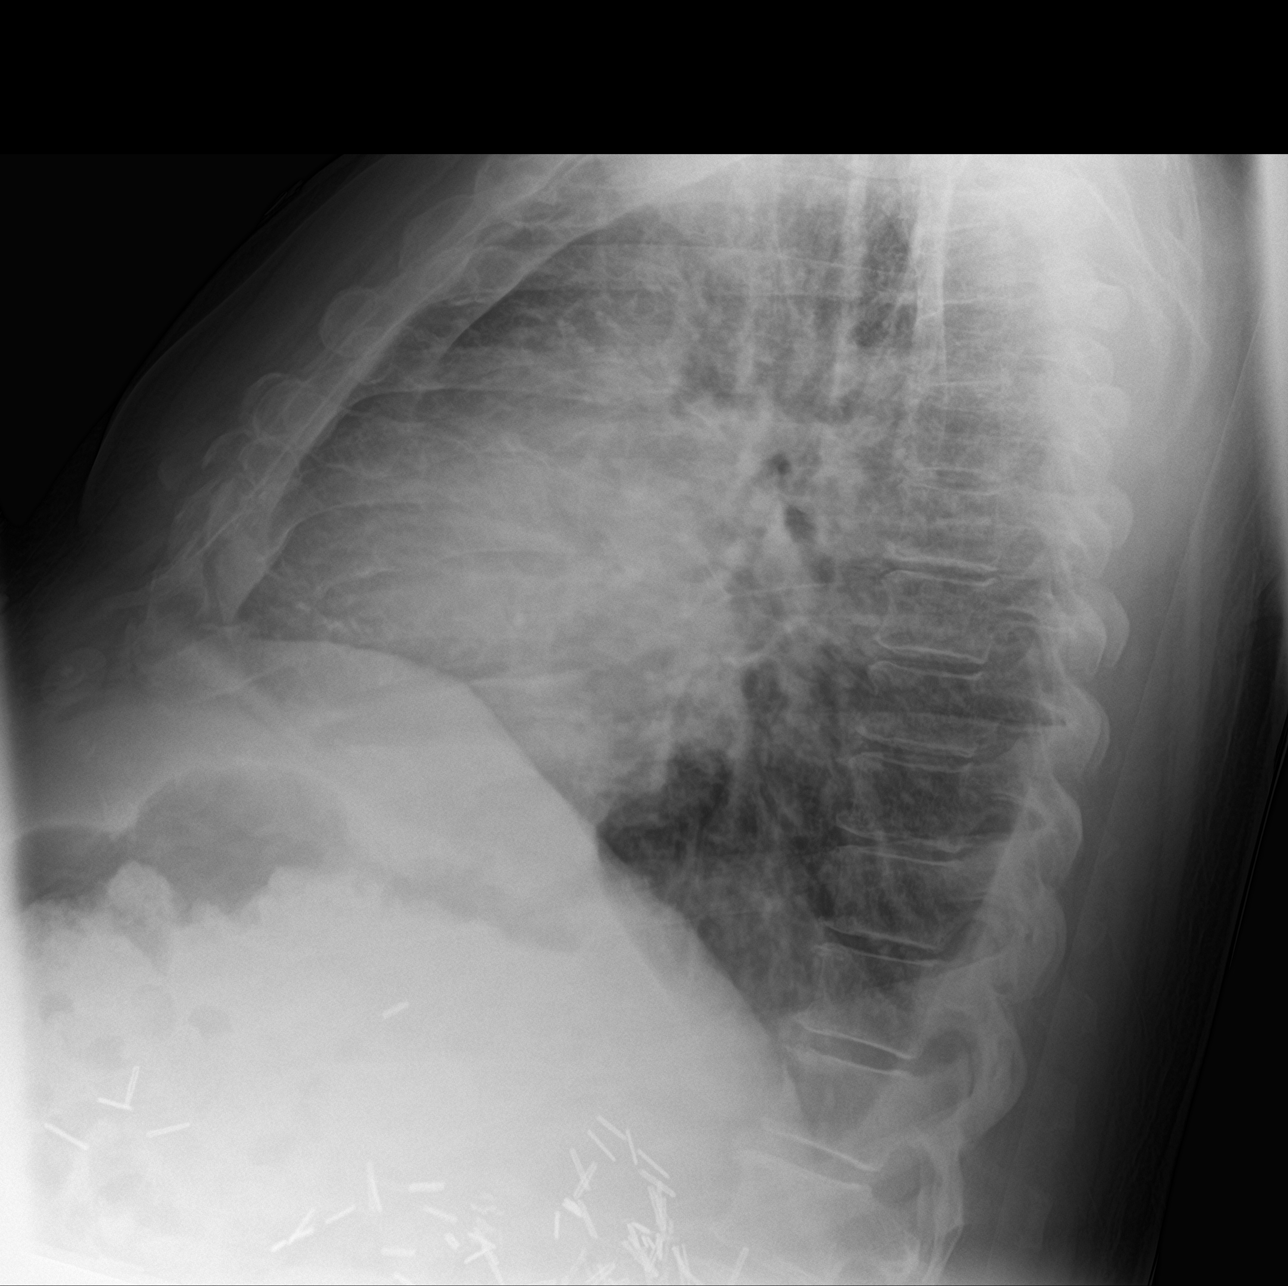

[chest ap (1 of 2)]
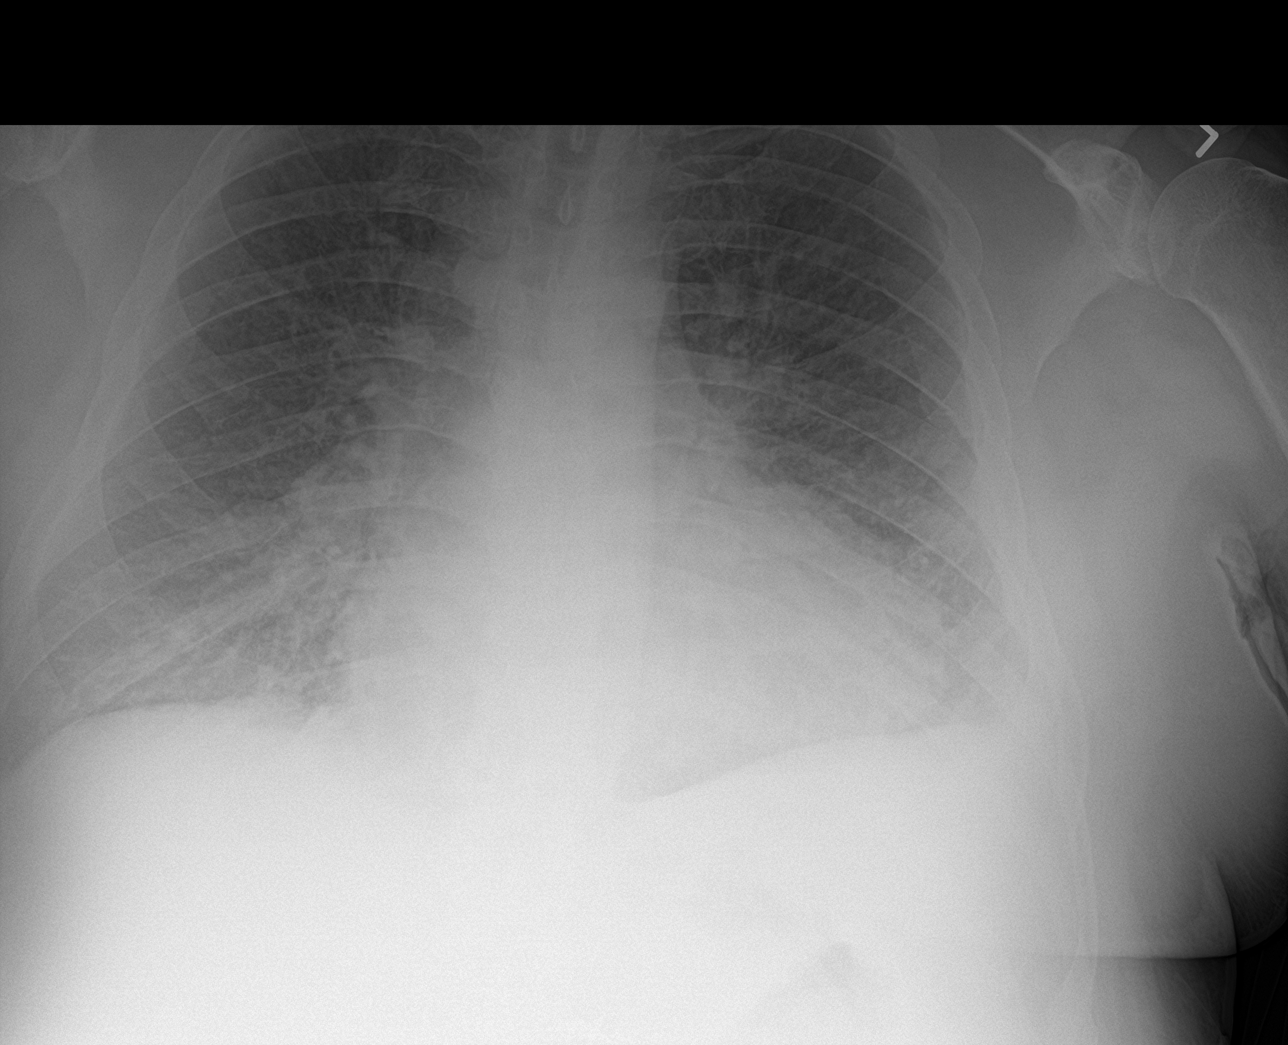

[chest ap (2 of 2)]
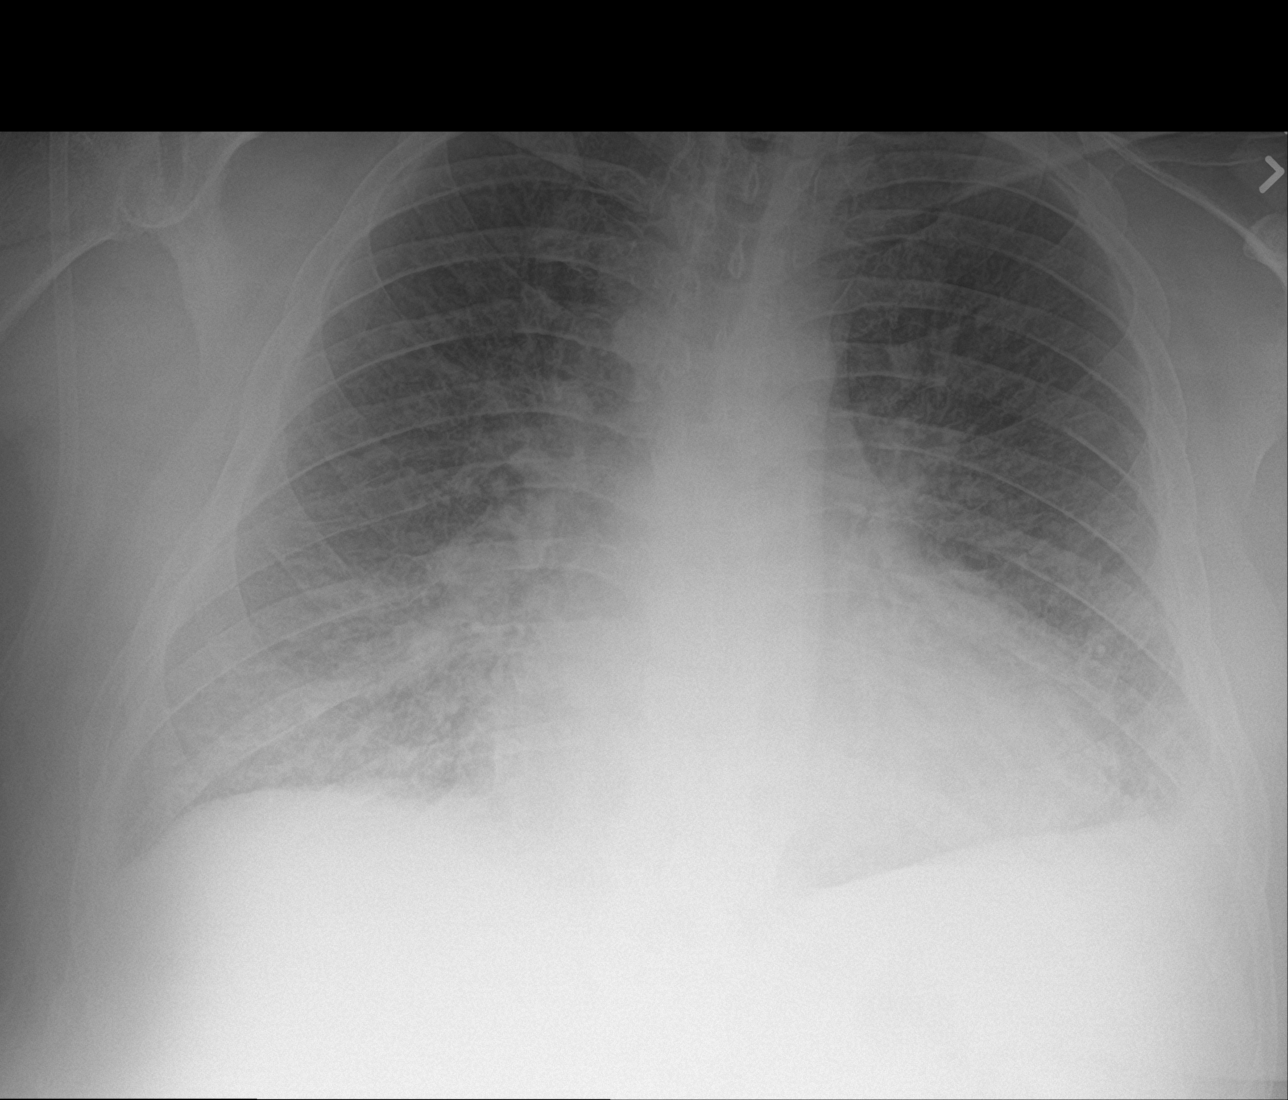

[3 of 3 positions shown; findings below may reference images not displayed]

FINDINGS: AP and lateral views of the chest. Ongoing and progressed bilateral
pulmonary interstitial opacity. Mild cardiomegaly and mediastinal
contours are stable. Visualized tracheal air column is within normal
limits. No pneumothorax. No definite pleural effusion.

No acute osseous abnormality identified. Numerous surgical clips
partially visible in the upper abdomen.
IMPRESSION: Ongoing and progressed since 06/28/2020 diffuse pulmonary
interstitial opacity.
Pulmonary edema is possible in the setting of atrial fibrillation,
although the absence of pleural fluid would argue in favor of
viral/atypical respiratory infection.

## 2021-08-03 ENCOUNTER — Encounter: Payer: Self-pay | Admitting: Internal Medicine

## 2021-09-21 ENCOUNTER — Encounter: Payer: Self-pay | Admitting: Student

## 2021-09-21 ENCOUNTER — Ambulatory Visit: Payer: Self-pay | Admitting: Student

## 2021-09-21 ENCOUNTER — Other Ambulatory Visit: Payer: Self-pay

## 2021-09-21 DIAGNOSIS — I340 Nonrheumatic mitral (valve) insufficiency: Secondary | ICD-10-CM

## 2021-09-21 DIAGNOSIS — R058 Other specified cough: Secondary | ICD-10-CM

## 2021-09-21 MED ORDER — HYDROCOD POLST-CPM POLST ER 10-8 MG/5ML PO SUER
5.0000 mL | Freq: Two times a day (BID) | ORAL | 0 refills | Status: DC | PRN
Start: 2021-09-21 — End: 2021-09-21

## 2021-09-21 MED ORDER — LIRAGLUTIDE 18 MG/3ML ~~LOC~~ SOPN
0.6000 mg | PEN_INJECTOR | Freq: Every day | SUBCUTANEOUS | 0 refills | Status: DC
  Filled 2021-09-21: qty 6, 30d supply, fill #0

## 2021-09-21 MED ORDER — DILTIAZEM HCL ER COATED BEADS 120 MG PO CP24
120.0000 mg | ORAL_CAPSULE | Freq: Every day | ORAL | 2 refills | Status: DC
Start: 2021-09-21 — End: 2021-12-09

## 2021-09-21 MED ORDER — HYDROCOD POLST-CPM POLST ER 10-8 MG/5ML PO SUER
5.0000 mL | Freq: Two times a day (BID) | ORAL | 0 refills | Status: DC | PRN
Start: 2021-09-21 — End: 2021-10-03

## 2021-09-21 MED ORDER — FUROSEMIDE 80 MG PO TABS
80.0000 mg | ORAL_TABLET | Freq: Two times a day (BID) | ORAL | 2 refills | Status: DC
Start: 2021-09-21 — End: 2021-12-09
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
## 2021-09-21 NOTE — Patient Instructions (Addendum)
Thank you, [name] for allowing [place] to provide your care today. Today we discussed   Cough I have written you a prescription for cough medicine. I would like you to follow up with our clinic in 2 weeks for a phone visit with me.  We will then discuss how you are doing.  If you are not doing any better I will then order a echocardiogram and chest x-ray  to make sure that this is not worsening of your mitral valve  If you have any worsening of your symptoms or new symptoms please call our clinic immediately.  If you feel that you are having a medical emergency please go to the nearest hospital.   Call your cardiologist and schedule visit to be seen.  I have ordered the following labs for you:  Lab Orders  No laboratory test(s) ordered today     Referrals ordered today:   Referral Orders  No referral(s) requested today     I have ordered the following medication/changed the following medications:   Stop the following medications: Medications Discontinued During This Encounter  Medication Reason   diltiazem (CARDIZEM CD) 120 MG 24 hr capsule    furosemide (LASIX) 80 MG tablet      Start the following medications: No orders of the defined types were placed in this encounter.    Follow up:  2 week tele health appointment      Should you have any questions or concerns please call the internal medicine clinic at [phone].    Sanjuana Letters, D.O. [place]

## 2021-09-21 NOTE — Assessment & Plan Note (Signed)
Assessment: Last echocardiogram 2021 patient with severe MR.  Per recent cardiology notes they were going to reach out to cardiothoracic surgery to discuss valve replacement.  When asked about this, patient states that he was told by Dr. Davina Poke to continue to work on weight loss.  Patient states he is struggled with this for some time.  He notes that he has difficulty with portion control.  As such I believe he would be a good candidate for GLP-1 agonist.  We will start patient on Victoza.  We will start off at 0.6 mg daily for 1 month.  Because patient is self-pay, [facility] will write him a 30-day prescription for $4 and so we will titrate up once a month.  Plan: -Follow-up with cardiology -Start Victoza for weight loss

## 2021-09-21 NOTE — Assessment & Plan Note (Signed)
Assessment: Patient states for the last 2 weeks since having a upper respiratory cold with nasal congestion he has had a productive cough with white sputum.  States in the past he had a chronic cough that resolved after treatment Tussionex.  He was worked up at that time with a chest x-ray that was negative.  He denies history of acid reflux, asthma or postnasal drip.  Denies waking up in the middle the night with burning sensation in his chest or having this feeling after meals.  Will expect this has been exacerbated by his recent upper respiratory function, possible that it is secondary to worsening of his mitral valve disease.  If no improvement in 2 weeks will order echocardiogram to make certain there has been no worsening of his mitral valve.  Low suspicion is currently volume overloaded, pulmonary exam unremarkable.  No JVD appreciated.  Trace bilateral lower extremity edema.  Plan: -Tussionex -Follow-up in 2 weeks if no improvement follow-up with cardiac work-up

## 2021-09-22 ENCOUNTER — Other Ambulatory Visit (HOSPITAL_COMMUNITY): Payer: Self-pay

## 2021-09-23 ENCOUNTER — Other Ambulatory Visit (HOSPITAL_COMMUNITY): Payer: Self-pay

## 2021-09-23 ENCOUNTER — Other Ambulatory Visit: Payer: Self-pay | Admitting: Student

## 2021-09-26 ENCOUNTER — Other Ambulatory Visit (HOSPITAL_COMMUNITY): Payer: Self-pay

## 2021-09-26 MED ORDER — INSULIN PEN NEEDLE 32G X 4 MM MISC
3 refills | Status: DC
  Filled 2021-09-26: qty 100, 30d supply, fill #0

## 2021-09-26 NOTE — Telephone Encounter (Signed)
Please send Rx for pen needles with IM Program in comments. Thank you.

## 2021-09-26 NOTE — Progress Notes (Signed)
Internal Medicine Clinic Attending  Case discussed with Dr. Katsadouros  At the time of the visit.  We reviewed the resident's history and exam and pertinent patient test results.  I agree with the assessment, diagnosis, and plan of care documented in the resident's note.  

## 2021-09-30 ENCOUNTER — Ambulatory Visit (INDEPENDENT_AMBULATORY_CARE_PROVIDER_SITE_OTHER): Payer: Self-pay | Admitting: Student

## 2021-09-30 VITALS — BP 132/84 | HR 96 | Temp 98.4°F | Ht 68.0 in | Wt 291.5 lb

## 2021-09-30 DIAGNOSIS — Z6841 Body Mass Index (BMI) 40.0 and over, adult: Secondary | ICD-10-CM

## 2021-09-30 DIAGNOSIS — R590 Localized enlarged lymph nodes: Secondary | ICD-10-CM
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
## 2021-09-30 NOTE — Patient Instructions (Signed)
Anthony Byrd, it was a pleasure seeing you today!  Today we discussed: - Painful lymph node: We believe the lump in your neck is a reactive lymph node. If it does not improve within the next week, please return to clinic. You can take Tylenol for this.  Follow-up:  if symptoms do not improve    Please make sure to arrive 15 minutes prior to your next appointment. If you arrive late, you may be asked to reschedule.   We look forward to seeing you next time. Please call our clinic at [phone] if you have any questions or concerns. The best time to call is Monday-Friday from 9am-4pm, but there is someone available 24/7. If after hours or the weekend, call the main hospital number and ask for the Internal Medicine Resident On-Call. If you need medication refills, please notify your pharmacy one week in advance and they will send [place] a request.  Thank you for letting us take part in your care. Wishing you the best!  Thank you, Sanjuan Dame, MD

## 2021-09-30 NOTE — Assessment & Plan Note (Addendum)
Patient is presenting today with 2-3 day history of painful lymphadenopathy. Patient was seen in clinic on 11/2 for cough, thought at the time to be most consistent with viral upper respiratory infection. Since this time, patient reports his cough has vastly improved. Earlier this week, patient had one day of tingling sensation in his tongue. After this, patient reports resolution of tingling but noticed a painful bump on the left side of his neck. He only noticed this once he rubbed against it. Since onset patient denies any changes in the region. He denies any recent weight changes, fevers, chills, night sweats, nasal congestion, tooth pain, skin changes.   On exam, there is a large, mobile, and painful lymph node located on left anterior cervical chain without overlying skin changes. Patient is not up-to-date on cancer screenings (no recent colonoscopy), but has no obvious high-risk features of underlying malignancy thus far. Most likely this is a reactive lymphadenopathy 2/2 recent viral infection. I have discussed with Anthony Byrd to continue with supportive care and return to clinic in one week if there is no improvement.    - Tylenol 650mg  q6h as needed for pain - Return to clinic in one week if no improvement

## 2021-10-03 ENCOUNTER — Other Ambulatory Visit: Payer: Self-pay | Admitting: Student

## 2021-10-03 DIAGNOSIS — R058 Other specified cough: Secondary | ICD-10-CM

## 2021-10-03 MED ORDER — HYDROCOD POLST-CPM POLST ER 10-8 MG/5ML PO SUER: 5.0000 mL | Freq: Two times a day (BID) | ORAL | 0 refills | Status: DC | PRN

## 2021-10-03 NOTE — Progress Notes (Signed)
Internal Medicine Clinic Attending  I saw and evaluated the patient.  I personally confirmed the key portions of the history and exam documented by Dr. Braswell and I reviewed pertinent patient test results.  The assessment, diagnosis, and plan were formulated together and I agree with the documentation in the resident's note.  

## 2021-10-04 ENCOUNTER — Other Ambulatory Visit (HOSPITAL_COMMUNITY): Payer: Self-pay

## 2021-10-05 ENCOUNTER — Telehealth: Payer: Self-pay | Admitting: *Deleted

## 2021-10-05 ENCOUNTER — Other Ambulatory Visit: Payer: Self-pay

## 2021-10-05 ENCOUNTER — Ambulatory Visit (INDEPENDENT_AMBULATORY_CARE_PROVIDER_SITE_OTHER): Payer: Self-pay | Admitting: Student

## 2021-10-05 DIAGNOSIS — R590 Localized enlarged lymph nodes: Secondary | ICD-10-CM

## 2021-10-05 DIAGNOSIS — R058 Other specified cough: Secondary | ICD-10-CM

## 2021-10-05 DIAGNOSIS — R053 Chronic cough: Secondary | ICD-10-CM

## 2021-10-05 MED ORDER — GUAIFENESIN-CODEINE 100-6.3 MG/5ML PO SOLN: 15.0000 mL | Freq: Two times a day (BID) | ORAL | 0 refills | Status: DC | PRN

## 2021-10-05 MED ORDER — GUAIFENESIN-CODEINE 100-6.3 MG/5ML PO SOLN: 5.0000 mL | Freq: Two times a day (BID) | ORAL | 0 refills | Status: DC | PRN

## 2021-10-05 NOTE — Telephone Encounter (Signed)
Thank you :)

## 2021-10-05 NOTE — Assessment & Plan Note (Signed)
Assessment: Patient has been dealing with productive cough for the past 6 weeks or so.  Patient has had this persistent cough since an upper respiratory infection 6 weeks ago.  He has been evaluated in our clinic 3 times for this.  We will repeat a chest x-ray as well as order an echocardiogram to make certain that he is not having worsening of his mitral valve disease.  We will also represcribe cough medicine, guaifenesin-codeine.  Patient denies any other symptoms at this time feels well aside from the cough.  Plan: -Prescribed guaifenesin-codeine 2-3 times a day -Chest x-ray and echocardiogram ordered

## 2021-10-05 NOTE — Assessment & Plan Note (Signed)
Assessment: Patient with improvement and resolution of his lymphadenopathy.  He states over the phone that he is no longer having any pain in the area and that the swelling has gone down.  Suspect this is secondary to his most recent viral infection.  Plan: -Continue to monitor

## 2021-10-05 NOTE — Telephone Encounter (Signed)
Anthony Byrd from [name] called in for clarification on guaifenesin-codeine Rx. States usual dose is 5 mL q 12 hours with qty of 118 mL. Spoke with Provider who has sent new Rx. Manuela Schwartz at [facility] has deleted the original Rx.
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
## 2021-10-05 NOTE — Patient Instructions (Signed)
Thank you, Mr.Deral E Beth for allowing [place] to provide your care today. Today we discussed .  Chronic cough We will represcribe you cough medicine.  We will also be ordering a chest x-ray and echocardiogram to make sure that you are not having further inflammation in your lungs or that this is worsening of your mitral valve disease.  We will call with these results.  Please look on a phone call for scheduling these test  I have ordered the following labs for you:  Lab Orders  No laboratory test(s) ordered today     Referrals ordered today:   Referral Orders  No referral(s) requested today     I have ordered the following medication/changed the following medications:   Stop the following medications: Medications Discontinued During This Encounter  Medication Reason   chlorpheniramine-HYDROcodone (TUSSIONEX) 10-8 MG/5ML SUER      Start the following medications: Meds ordered this encounter  Medications   guaiFENesin-Codeine 100-6.3 MG/5ML SOLN    Sig: Take 15 mLs by mouth every 12 (twelve) hours as needed (cough).    Dispense:  473 mL    Refill:  0     Follow up:  1 month follow up for cough    Remember: If you develop any other symptoms please give our clinic a call  Should you have any questions or concerns please call the internal medicine clinic at [phone].    Sanjuana Letters, D.O. [place]

## 2021-10-05 NOTE — Addendum Note (Signed)
Addended by: Riesa Pope on: 10/05/2021 09:26 AM   Modules accepted: Orders

## 2021-10-07 NOTE — Progress Notes (Signed)
Internal Medicine Clinic Attending  Case discussed with Dr. Katsadouros  At the time of the visit.  We reviewed the resident's history and exam and pertinent patient test results.  I agree with the assessment, diagnosis, and plan of care documented in the resident's note.  

## 2021-11-17 ENCOUNTER — Encounter: Payer: Self-pay | Admitting: Internal Medicine

## 2021-11-17 MED ORDER — GUAIFENESIN-CODEINE 100-6.3 MG/5ML PO SOLN: 5.0000 mL | Freq: Two times a day (BID) | ORAL | 0 refills | Status: DC | PRN

## 2021-11-17 NOTE — Telephone Encounter (Signed)
Refill sent.

## 2021-11-28 NOTE — Telephone Encounter (Signed)
Called pt - stated he needs to make an appt. I asked him what's going on - he stated he needs pain med for his legs and for his breathing ( somewhat sob). Call transferred to front office - appt schedule with Dr Elliot Gurney 11/29/20 @ 1015 AM.

## 2021-11-29 ENCOUNTER — Ambulatory Visit: Payer: Self-pay | Admitting: Internal Medicine

## 2021-11-29 VITALS — BP 114/78 | HR 111 | Temp 98.5°F | Ht 68.0 in | Wt 297.0 lb

## 2021-11-29 DIAGNOSIS — M25562 Pain in left knee: Secondary | ICD-10-CM

## 2021-11-29 DIAGNOSIS — I5032 Chronic diastolic (congestive) heart failure: Secondary | ICD-10-CM

## 2021-11-29 DIAGNOSIS — G8929 Other chronic pain: Secondary | ICD-10-CM

## 2021-11-29 DIAGNOSIS — I4811 Longstanding persistent atrial fibrillation: Secondary | ICD-10-CM

## 2021-11-29 LAB — BASIC METABOLIC PANEL
Anion gap: 9 (ref 5–15)
BUN: 22 mg/dL — ABNORMAL HIGH (ref 6–20)
CO2: 28 mmol/L (ref 22–32)
Calcium: 9.3 mg/dL (ref 8.9–10.3)
Chloride: 101 mmol/L (ref 98–111)
Creatinine, Ser: 1.05 mg/dL (ref 0.61–1.24)
GFR, Estimated: >60 mL/min (ref >60)
Glucose, Bld: 132 mg/dL — ABNORMAL HIGH (ref 70–99)
Potassium: 3.6 mmol/L (ref 3.5–5.1)
Sodium: 138 mmol/L (ref 135–145)

## 2021-11-29 LAB — BRAIN NATRIURETIC PEPTIDE: B Natriuretic Peptide: 55.1 pg/mL (ref 0.0–100.0)
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
## 2021-11-29 NOTE — Patient Instructions (Addendum)
Dear Anthony Byrd,  Please continue to take the lasix once in the morning and once at night.   Please restart taking the diltiazem, metoprolol, and amiodarone. Please restart taking the amiodarone as well.   We will send these prescriptions to the outpatient pharmacy.

## 2021-12-01 ENCOUNTER — Inpatient Hospital Stay (HOSPITAL_COMMUNITY)
Admission: EM | Admit: 2021-12-01 | Discharge: 2021-12-09 | DRG: 286 | Disposition: A | Discharge status: Other Acute Inpatient Hospital | Payer: Self-pay | Attending: Internal Medicine | Admitting: Internal Medicine

## 2021-12-01 ENCOUNTER — Other Ambulatory Visit: Payer: Self-pay

## 2021-12-01 ENCOUNTER — Emergency Department (HOSPITAL_COMMUNITY): Payer: Self-pay

## 2021-12-01 DIAGNOSIS — Z95828 Presence of other vascular implants and grafts: Secondary | ICD-10-CM

## 2021-12-01 DIAGNOSIS — R040 Epistaxis: Secondary | ICD-10-CM | POA: Diagnosis not present

## 2021-12-01 DIAGNOSIS — I4819 Other persistent atrial fibrillation: Secondary | ICD-10-CM | POA: Diagnosis present

## 2021-12-01 DIAGNOSIS — K760 Fatty (change of) liver, not elsewhere classified: Secondary | ICD-10-CM | POA: Diagnosis present

## 2021-12-01 DIAGNOSIS — I340 Nonrheumatic mitral (valve) insufficiency: Secondary | ICD-10-CM

## 2021-12-01 DIAGNOSIS — Z91120 Patient's intentional underdosing of medication regimen due to financial hardship: Secondary | ICD-10-CM

## 2021-12-01 DIAGNOSIS — R109 Unspecified abdominal pain: Secondary | ICD-10-CM

## 2021-12-01 DIAGNOSIS — I4891 Unspecified atrial fibrillation: Secondary | ICD-10-CM | POA: Diagnosis present

## 2021-12-01 DIAGNOSIS — M109 Gout, unspecified: Secondary | ICD-10-CM | POA: Diagnosis present

## 2021-12-01 DIAGNOSIS — Z91010 Allergy to peanuts: Secondary | ICD-10-CM

## 2021-12-01 DIAGNOSIS — Z8547 Personal history of malignant neoplasm of testis: Secondary | ICD-10-CM

## 2021-12-01 DIAGNOSIS — Z91199 Patient's noncompliance with other medical treatment and regimen due to unspecified reason: Secondary | ICD-10-CM

## 2021-12-01 DIAGNOSIS — J811 Chronic pulmonary edema: Secondary | ICD-10-CM

## 2021-12-01 DIAGNOSIS — G4733 Obstructive sleep apnea (adult) (pediatric): Secondary | ICD-10-CM | POA: Diagnosis present

## 2021-12-01 DIAGNOSIS — Z20822 Contact with and (suspected) exposure to covid-19: Secondary | ICD-10-CM | POA: Diagnosis present

## 2021-12-01 DIAGNOSIS — N132 Hydronephrosis with renal and ureteral calculous obstruction: Secondary | ICD-10-CM | POA: Diagnosis present

## 2021-12-01 DIAGNOSIS — I3489 Other nonrheumatic mitral valve disorders: Principal | ICD-10-CM | POA: Diagnosis present

## 2021-12-01 DIAGNOSIS — Z79899 Other long term (current) drug therapy: Secondary | ICD-10-CM

## 2021-12-01 DIAGNOSIS — J9601 Acute respiratory failure with hypoxia: Secondary | ICD-10-CM

## 2021-12-01 DIAGNOSIS — N179 Acute kidney failure, unspecified: Secondary | ICD-10-CM | POA: Diagnosis present

## 2021-12-01 DIAGNOSIS — E8881 Metabolic syndrome: Secondary | ICD-10-CM | POA: Diagnosis present

## 2021-12-01 DIAGNOSIS — I272 Pulmonary hypertension, unspecified: Secondary | ICD-10-CM | POA: Diagnosis present

## 2021-12-01 DIAGNOSIS — R0609 Other forms of dyspnea: Secondary | ICD-10-CM

## 2021-12-01 DIAGNOSIS — I509 Heart failure, unspecified: Secondary | ICD-10-CM

## 2021-12-01 DIAGNOSIS — R0989 Other specified symptoms and signs involving the circulatory and respiratory systems: Secondary | ICD-10-CM

## 2021-12-01 DIAGNOSIS — Z6841 Body Mass Index (BMI) 40.0 and over, adult: Secondary | ICD-10-CM

## 2021-12-01 DIAGNOSIS — Z794 Long term (current) use of insulin: Secondary | ICD-10-CM

## 2021-12-01 DIAGNOSIS — I959 Hypotension, unspecified: Secondary | ICD-10-CM | POA: Diagnosis present

## 2021-12-01 DIAGNOSIS — I5033 Acute on chronic diastolic (congestive) heart failure: Secondary | ICD-10-CM | POA: Diagnosis present

## 2021-12-01 DIAGNOSIS — I5031 Acute diastolic (congestive) heart failure: Secondary | ICD-10-CM

## 2021-12-01 DIAGNOSIS — Z635 Disruption of family by separation and divorce: Secondary | ICD-10-CM

## 2021-12-01 DIAGNOSIS — T45516A Underdosing of anticoagulants, initial encounter: Secondary | ICD-10-CM | POA: Diagnosis present

## 2021-12-01 DIAGNOSIS — E119 Type 2 diabetes mellitus without complications: Secondary | ICD-10-CM | POA: Diagnosis present

## 2021-12-01 DIAGNOSIS — R319 Hematuria, unspecified: Secondary | ICD-10-CM

## 2021-12-01 DIAGNOSIS — Z7901 Long term (current) use of anticoagulants: Secondary | ICD-10-CM

## 2021-12-01 LAB — CBC WITH DIFFERENTIAL/PLATELET
Abs Immature Granulocytes: 0.12 10*3/uL — ABNORMAL HIGH (ref 0.00–0.07)
Basophils Absolute: 0.1 10*3/uL (ref 0.0–0.1)
Basophils Relative: 1 %
Eosinophils Absolute: 0.1 10*3/uL (ref 0.0–0.5)
Eosinophils Relative: 1 %
HCT: 52.5 % — ABNORMAL HIGH (ref 39.0–52.0)
Hemoglobin: 17.2 g/dL — ABNORMAL HIGH (ref 13.0–17.0)
Immature Granulocytes: 1 %
Lymphocytes Relative: 14 %
Lymphs Abs: 1.6 10*3/uL (ref 0.7–4.0)
MCH: 28.6 pg (ref 26.0–34.0)
MCHC: 32.8 g/dL (ref 30.0–36.0)
MCV: 87.2 fL (ref 80.0–100.0)
Monocytes Absolute: 0.8 10*3/uL (ref 0.1–1.0)
Monocytes Relative: 7 %
Neutro Abs: 8.7 10*3/uL — ABNORMAL HIGH (ref 1.7–7.7)
Neutrophils Relative %: 76 %
Platelets: 255 10*3/uL (ref 150–400)
RBC: 6.02 MIL/uL — ABNORMAL HIGH (ref 4.22–5.81)
RDW: 15.5 % (ref 11.5–15.5)
WBC: 11.3 10*3/uL — ABNORMAL HIGH (ref 4.0–10.5)
nRBC: 0 % (ref 0.0–0.2)

## 2021-12-01 LAB — COMPREHENSIVE METABOLIC PANEL
ALT: 41 U/L (ref 0–44)
AST: 44 U/L — ABNORMAL HIGH (ref 15–41)
Albumin: 3.2 g/dL — ABNORMAL LOW (ref 3.5–5.0)
Alkaline Phosphatase: 61 U/L (ref 38–126)
Anion gap: 13 (ref 5–15)
BUN: 31 mg/dL — ABNORMAL HIGH (ref 6–20)
CO2: 24 mmol/L (ref 22–32)
Calcium: 9.2 mg/dL (ref 8.9–10.3)
Chloride: 99 mmol/L (ref 98–111)
Creatinine, Ser: 1.85 mg/dL — ABNORMAL HIGH (ref 0.61–1.24)
GFR, Estimated: 43 mL/min — ABNORMAL LOW (ref >60)
Glucose, Bld: 138 mg/dL — ABNORMAL HIGH (ref 70–99)
Potassium: 3.7 mmol/L (ref 3.5–5.1)
Sodium: 136 mmol/L (ref 135–145)
Total Bilirubin: 1.4 mg/dL — ABNORMAL HIGH (ref 0.3–1.2)
Total Protein: 6.7 g/dL (ref 6.5–8.1)

## 2021-12-01 LAB — LIPASE, BLOOD: Lipase: 40 U/L (ref 11–51)

## 2021-12-01 LAB — BRAIN NATRIURETIC PEPTIDE: B Natriuretic Peptide: 102.6 pg/mL — ABNORMAL HIGH (ref 0.0–100.0)

## 2021-12-01 MED ORDER — METOPROLOL TARTRATE 50 MG PO TABS
100.0000 mg | ORAL_TABLET | Freq: Two times a day (BID) | ORAL | Status: DC
  Administered 2021-12-02 – 2021-12-08 (×11): 100 mg via ORAL
  Filled 2021-12-01: qty 2
  Filled 2021-12-01 (×9): qty 1
  Filled 2021-12-01: qty 2
  Filled 2021-12-01: qty 4

## 2021-12-01 MED ORDER — ACETAMINOPHEN 325 MG PO TABS
650.0000 mg | ORAL_TABLET | Freq: Four times a day (QID) | ORAL | Status: DC | PRN
  Administered 2021-12-03 – 2021-12-04 (×3): 650 mg via ORAL
  Filled 2021-12-01 (×3): qty 2

## 2021-12-01 MED ORDER — AMIODARONE HCL 200 MG PO TABS: 200.0000 mg | ORAL_TABLET | Freq: Every day | ORAL | Status: DC

## 2021-12-01 MED ORDER — ACETAMINOPHEN 650 MG RE SUPP: 650.0000 mg | Freq: Four times a day (QID) | RECTAL | Status: DC | PRN

## 2021-12-01 MED ORDER — DILTIAZEM HCL ER COATED BEADS 120 MG PO CP24: 120.0000 mg | ORAL_CAPSULE | Freq: Every day | ORAL | Status: DC

## 2021-12-01 MED ORDER — SODIUM CHLORIDE 0.9% FLUSH
3.0000 mL | Freq: Two times a day (BID) | INTRAVENOUS | Status: DC
  Administered 2021-12-02 – 2021-12-09 (×5): 3 mL via INTRAVENOUS

## 2021-12-01 MED ORDER — POLYETHYLENE GLYCOL 3350 17 G PO PACK: 17.0000 g | PACK | Freq: Every day | ORAL | Status: DC | PRN

## 2021-12-01 MED ORDER — ADULT MULTIVITAMIN W/MINERALS CH
1.0000 | ORAL_TABLET | Freq: Every day | ORAL | Status: DC
  Administered 2021-12-02 – 2021-12-09 (×7): 1 via ORAL
  Filled 2021-12-01 (×7): qty 1

## 2021-12-01 NOTE — ED Notes (Signed)
Pt ambulated around room. O2 remained above 95%, SOB while walking noted.
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
## 2021-12-01 NOTE — ED Provider Triage Note (Signed)
Emergency Medicine Provider Triage Evaluation Note  PEREZ DIRICO , a 51 y.o. male  was evaluated in triage.  Pt complains of sob.  Review of Systems  Positive: Chest pressure, sob, nausea, indigestion Negative: Fever, worsening cough, exertional CP  Physical Exam  BP 98/64 (BP Location: Right Arm)    Pulse 99    Temp 98.9 F (37.2 C) (Oral)    Resp 16    SpO2 93%  Gen:   Awake, no distress   Resp:  Normal effort  MSK:   Moves extremities without difficulty  Other:    Medical Decision Making  Medically screening exam initiated at 10:14 AM.  Appropriate orders placed.  CALOGERO GEISEN was informed that the remainder of the evaluation will be completed by another provider, this initial triage assessment does not replace that evaluation, and the importance of remaining in the ED until their evaluation is complete.  Pt with hx of CHF here with sob, chest pressure, nausea and indigestion x 4 days.  Does not appear fluid overloaded.  No exertional cp. No covid sxs.    Domenic Moras, PA-C 12/01/21 1020

## 2021-12-01 NOTE — ED Provider Notes (Signed)
[facility] EMERGENCY DEPARTMENT Provider Note    CSN: [id number] Arrival date & time: 12/01/21 0932  History Chief Complaint  Patient presents with   Shortness of Breath   Nausea    Anthony Byrd is a 51 y.o. male with history of afib. CHF, mitral regurg. He has had several weeks of SOB, DOE that comes and goes. Sometimes he is able to walk without difficulty and sometimes he gets winded going up stairs. He does not have any CP or fevers. He had been noncompliant with his medications recently, stopped his metoprolol, never got amiodarone filled, stopped eliquis due to cost and ran out of lasix. He saw his PCP yesterday who restarted his meds (except for eliquis). He was feeling nervous about his symptoms today and so he came to the ED for evaluation.    Home Medications Prior to Admission medications   Medication Sig Start Date End Date Taking? Authorizing Provider  amiodarone (PACERONE) 200 MG tablet Take 1 tablet (200 mg total) by mouth daily. Take 2 tablets by mouth twice daily for 7 days, then 200 mg daily after 07/16/20   Geralynn Rile, MD  diltiazem (CARDIZEM CD) 120 MG 24 hr capsule Take 1 capsule (120 mg total) by mouth daily. 09/21/21   Riesa Pope, MD  furosemide (LASIX) 80 MG tablet Take 1 tablet (80 mg total) by mouth 2 (two) times daily. 09/21/21   Katsadouros, Vasilios, MD  guaiFENesin-Codeine 100-6.3 MG/5ML SOLN Take 5 mLs by mouth every 12 (twelve) hours as needed (cough). 11/17/21   Mitzi Hansen, MD  Insulin Pen Needle 32G X 4 MM MISC Use with Victoza 09/26/21   Katsadouros, Vasilios, MD  liraglutide (VICTOZA) 18 MG/3ML SOPN Inject 0.6 mg into the skin daily. Will increase dose after 1 month to 1.2mg  daily 09/21/21   Riesa Pope, MD  metoprolol tartrate (LOPRESSOR) 100 MG tablet Take 1 tablet (100 mg total) by mouth 2 (two) times daily. 01/28/21   Mitzi Hansen, MD  metoprolol tartrate (LOPRESSOR) 100 MG tablet TAKE 1 TABLET (100 MG TOTAL)  BY MOUTH 2 (TWO) TIMES DAILY. 01/28/21 01/28/22  Mitzi Hansen, MD  potassium chloride SA (KLOR-CON M20) 20 MEQ tablet Take 1 tablet (20 mEq total) by mouth daily. 01/28/21   Christian, Rylee, MD  potassium chloride SA (KLOR-CON) 20 MEQ tablet TAKE 1 TABLET (20 MEQ TOTAL) BY MOUTH DAILY. 01/28/21 01/28/22  Mitzi Hansen, MD  rivaroxaban (XARELTO) 20 MG TABS tablet Take 1 tablet (20 mg total) by mouth daily with supper. 08/17/20   Skeet Latch, MD     Allergies    Peanut-containing drug products   Review of Systems   Review of Systems Please see HPI for pertinent positives and negatives  Physical Exam BP (!) 105/93    Pulse (!) 117    Temp 98.2 F (36.8 C) (Oral)    Resp (!) 21    Ht 5\' 9"  (1.753 m)    Wt 133.8 kg    SpO2 91%    BMI 43.56 kg/m   Physical Exam Vitals and nursing note reviewed.  Constitutional:      Appearance: Normal appearance.  HENT:     Head: Normocephalic and atraumatic.     Nose: Nose normal.     Mouth/Throat:     Mouth: Mucous membranes are moist.  Eyes:     Extraocular Movements: Extraocular movements intact.     Conjunctiva/sclera: Conjunctivae normal.  Cardiovascular:     Rate and Rhythm: Tachycardia present. Rhythm irregular.  Pulmonary:     Effort: Pulmonary effort is normal.     Breath sounds: Normal breath sounds. No wheezing or rales.  Abdominal:     General: Abdomen is flat.     Palpations: Abdomen is soft.     Tenderness: There is no abdominal tenderness.  Musculoskeletal:        General: No swelling. Normal range of motion.     Cervical back: Neck supple.     Right lower leg: No edema.     Left lower leg: No edema.  Skin:    General: Skin is warm and dry.  Neurological:     General: No focal deficit present.     Mental Status: He is alert.  Psychiatric:        Mood and Affect: Mood normal.    ED Results / Procedures / Treatments   EKG EKG Interpretation  Date/Time:  Thursday December 01 2021 09:54:37 EST Ventricular Rate:   102 PR Interval:    QRS Duration: 82 QT Interval:  350 QTC Calculation: 456 R Axis:   87 Text Interpretation: Atrial fibrillation with rapid ventricular response Abnormal QRS-T angle, consider primary T wave abnormality Abnormal ECG When compared with ECG of 28-Jan-2021 11:15, No significant change since last tracing Confirmed by Calvert Cantor [phone]) on 12/01/2021 9:01:49 PM  Procedures Procedures  Medications Ordered in the ED Medications - No data to display  Initial Impression and Plan Patient with complex cardiac history here with intermittent SOB. He had been noncompliant with many of his medications which were restarted 2 days ago. Labs and imaging ordered in triage reviewed, CBC with mild leukocytosis of unclear significance. CMP shows worsening kidney function compared to PCP visit just two days ago. BMP is mildly elevated, but no significant pulm edema seen on CXR. Will attempt ambulation with pulse ox.     ED Course   Clinical Course as of 12/01/21 2205  Thu Dec 01, 2021  2137 Patient able to ambulate without significant hypoxia (93-95%) but his HR jumped to the 140s and his RR increased as well. Will discuss with medicine to admit. Patient reluctant to stay but will agree to stay overnight and reassess tomorrow.  [CS]  2158 Spoke with IM resident who will come evaluate the patient.  [CS]    Clinical Course User Index [CS] Truddie Hidden, MD     MDM Rules/Calculators/A&P Medical Decision Making Problems Addressed: Atrial fibrillation, unspecified type [facility]): chronic illness or injury with exacerbation, progression, or side effects of treatment that poses a threat to life or bodily functions  Amount and/or Complexity of Data Reviewed External Data Reviewed: labs, radiology, ECG and notes. Labs: ordered. Decision-making details documented in ED Course. Radiology: ordered and independent interpretation performed. Decision-making details documented in ED  Course. ECG/medicine tests: ordered and independent interpretation performed. Decision-making details documented in ED Course.  Risk Decision regarding hospitalization.    Final Clinical Impression(s) / ED Diagnoses Final diagnoses:  Dyspnea on exertion  Atrial fibrillation, unspecified type [facility])    Rx / DC Orders ED Discharge Orders     None        Truddie Hidden, MD 12/01/21 2205

## 2021-12-01 NOTE — ED Triage Notes (Signed)
Pt. Stated, Anthony Byrd been SOB and nauseated since yesterday

## 2021-12-02 ENCOUNTER — Other Ambulatory Visit: Payer: Self-pay

## 2021-12-02 ENCOUNTER — Observation Stay (HOSPITAL_COMMUNITY): Payer: Self-pay

## 2021-12-02 ENCOUNTER — Observation Stay (HOSPITAL_BASED_OUTPATIENT_CLINIC_OR_DEPARTMENT_OTHER): Payer: Self-pay

## 2021-12-02 ENCOUNTER — Encounter: Payer: Self-pay | Admitting: Internal Medicine

## 2021-12-02 DIAGNOSIS — I5033 Acute on chronic diastolic (congestive) heart failure: Secondary | ICD-10-CM

## 2021-12-02 DIAGNOSIS — M25562 Pain in left knee: Secondary | ICD-10-CM | POA: Insufficient documentation

## 2021-12-02 DIAGNOSIS — R0609 Other forms of dyspnea: Secondary | ICD-10-CM

## 2021-12-02 DIAGNOSIS — I4891 Unspecified atrial fibrillation: Secondary | ICD-10-CM

## 2021-12-02 LAB — RESP PANEL BY RT-PCR (FLU A&B, COVID) ARPGX2
Influenza A by PCR: NEGATIVE
Influenza B by PCR: NEGATIVE
SARS Coronavirus 2 by RT PCR: NEGATIVE

## 2021-12-02 LAB — CBC
HCT: 47.4 % (ref 39.0–52.0)
Hemoglobin: 15.6 g/dL (ref 13.0–17.0)
MCH: 28.9 pg (ref 26.0–34.0)
MCHC: 32.9 g/dL (ref 30.0–36.0)
MCV: 87.8 fL (ref 80.0–100.0)
Platelets: 217 10*3/uL (ref 150–400)
RBC: 5.4 MIL/uL (ref 4.22–5.81)
RDW: 15.3 % (ref 11.5–15.5)
WBC: 10.4 10*3/uL (ref 4.0–10.5)
nRBC: 0 % (ref 0.0–0.2)

## 2021-12-02 LAB — BASIC METABOLIC PANEL
Anion gap: 11 (ref 5–15)
BUN: 36 mg/dL — ABNORMAL HIGH (ref 6–20)
CO2: 25 mmol/L (ref 22–32)
Calcium: 8.6 mg/dL — ABNORMAL LOW (ref 8.9–10.3)
Chloride: 98 mmol/L (ref 98–111)
Creatinine, Ser: 1.53 mg/dL — ABNORMAL HIGH (ref 0.61–1.24)
GFR, Estimated: 54 mL/min — ABNORMAL LOW (ref >60)
Glucose, Bld: 138 mg/dL — ABNORMAL HIGH (ref 70–99)
Potassium: 3.5 mmol/L (ref 3.5–5.1)
Sodium: 134 mmol/L — ABNORMAL LOW (ref 135–145)

## 2021-12-02 LAB — ECHOCARDIOGRAM COMPLETE
AR max vel: 1.45 cm2
AV Area VTI: 1.59 cm2
AV Area mean vel: 1.4 cm2
AV Mean grad: 12 mmHg
AV Peak grad: 22.8 mmHg
Ao pk vel: 2.39 m/s
Calc EF: 36.6 %
Height: 69 in
MV VTI: 0.43 cm2
S' Lateral: 2.8 cm
Single Plane A2C EF: 34.4 %
Single Plane A4C EF: 38.7 %
Weight: 4720 oz

## 2021-12-02 LAB — LIPID PANEL
Cholesterol: 176 mg/dL (ref 0–200)
HDL: 23 mg/dL — ABNORMAL LOW (ref >40)
LDL Cholesterol: 130 mg/dL — ABNORMAL HIGH (ref 0–99)
Total CHOL/HDL Ratio: 7.7 RATIO
Triglycerides: 116 mg/dL (ref <150)
VLDL: 23 mg/dL (ref 0–40)

## 2021-12-02 LAB — HIV ANTIBODY (ROUTINE TESTING W REFLEX): HIV Screen 4th Generation wRfx: NONREACTIVE

## 2021-12-02 LAB — HEMOGLOBIN A1C
Hgb A1c MFr Bld: 6 % — ABNORMAL HIGH (ref 4.8–5.6)
Mean Plasma Glucose: 125.5 mg/dL

## 2021-12-02 MED ORDER — RIVAROXABAN 20 MG PO TABS
20.0000 mg | ORAL_TABLET | Freq: Every day | ORAL | 6 refills | Status: DC
  Filled 2021-12-02: qty 30, 30d supply, fill #0

## 2021-12-02 MED ORDER — PERFLUTREN LIPID MICROSPHERE
1.0000 mL | INTRAVENOUS | Status: AC | PRN
  Administered 2021-12-02: 2 mL via INTRAVENOUS
  Filled 2021-12-02: qty 10

## 2021-12-02 MED ORDER — DILTIAZEM HCL ER COATED BEADS 180 MG PO CP24
180.0000 mg | ORAL_CAPSULE | Freq: Every day | ORAL | Status: DC
  Administered 2021-12-02 – 2021-12-03 (×2): 180 mg via ORAL
  Filled 2021-12-02 (×3): qty 1

## 2021-12-02 MED ORDER — AMIODARONE HCL 200 MG PO TABS
400.0000 mg | ORAL_TABLET | Freq: Every day | ORAL | Status: DC
  Administered 2021-12-02 – 2021-12-06 (×4): 400 mg via ORAL
  Filled 2021-12-02 (×5): qty 2

## 2021-12-02 MED ORDER — DICLOFENAC SODIUM 1 % EX GEL
2.0000 g | Freq: Four times a day (QID) | CUTANEOUS | 0 refills | Status: DC
  Filled 2021-12-02: qty 100, 12d supply, fill #0

## 2021-12-02 MED ORDER — ACETAMINOPHEN 500 MG PO TABS
500.0000 mg | ORAL_TABLET | Freq: Four times a day (QID) | ORAL | 0 refills | Status: DC | PRN
  Filled 2021-12-02: qty 30, 8d supply, fill #0

## 2021-12-02 MED ORDER — SIMETHICONE 80 MG PO CHEW
80.0000 mg | CHEWABLE_TABLET | Freq: Four times a day (QID) | ORAL | 2 refills | Status: DC | PRN
  Filled 2021-12-02: qty 100, 25d supply, fill #0

## 2021-12-02 MED ORDER — APIXABAN 5 MG PO TABS
5.0000 mg | ORAL_TABLET | Freq: Two times a day (BID) | ORAL | Status: DC
  Administered 2021-12-02 – 2021-12-03 (×4): 5 mg via ORAL
  Filled 2021-12-02 (×4): qty 1

## 2021-12-02 MED ORDER — POTASSIUM CHLORIDE CRYS ER 20 MEQ PO TBCR
20.0000 meq | EXTENDED_RELEASE_TABLET | Freq: Every day | ORAL | Status: DC
  Administered 2021-12-02 – 2021-12-08 (×6): 20 meq via ORAL
  Filled 2021-12-02 (×6): qty 1

## 2021-12-02 MED ORDER — ACETAMINOPHEN 500 MG PO TABS
1000.0000 mg | ORAL_TABLET | Freq: Four times a day (QID) | ORAL | 2 refills | Status: DC | PRN
  Filled 2021-12-02: qty 100, 13d supply, fill #0

## 2021-12-02 MED ORDER — FUROSEMIDE 10 MG/ML IJ SOLN
40.0000 mg | Freq: Once | INTRAMUSCULAR | Status: AC
  Administered 2021-12-02: 40 mg via INTRAVENOUS
  Filled 2021-12-02: qty 4

## 2021-12-02 MED ORDER — DILTIAZEM HCL 60 MG PO TABS
60.0000 mg | ORAL_TABLET | Freq: Once | ORAL | Status: AC
  Administered 2021-12-02: 60 mg via ORAL
  Filled 2021-12-02: qty 1

## 2021-12-02 NOTE — ED Notes (Signed)
Pt eating lunch at this time

## 2021-12-02 NOTE — Assessment & Plan Note (Addendum)
Patient reported knee pain left leg. He had not noticed any edema or erythema. Reported occasional click, but nothing during OV. Full range of motion. No tenderness to palpation. No signs of infection. Possibly arthritic pain given patient's body habitus.   Given rx for tylenol arthritis and voltaren gel.
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
## 2021-12-02 NOTE — Progress Notes (Signed)
° °  Echocardiogram 2D Echocardiogram has been performed.  Beryle Beams 12/02/2021, 9:10 AM
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
## 2021-12-02 NOTE — Hospital Course (Addendum)
Persistent valvular Atrial Fibrillation with RVR Severe nonrheumatic mitral regurgitation HFpEF, chronic Intermittently entering RVR with rates between 100-120, and as high as 140 with ambulation. Two prior failed cardioversions in '21 and '22. Regimen includes: amiodarone 200, diltiazem 60, metop 100 bid, and Eliquis 5 (Chadvasc 2). Poor med compliance; has not been taking Eliquis 2/2 cost, and re-started taking Amio and metoprolol Tuesday.  On admit, restarted diltiazem 180 mg daily, metoprolol 100 mg twice daily, Eliquis 5 mg daily, and amiodarone 400 mg p.o. daily.  Echo showed EF 50-55% with moderate to severe MR. Afib is likely 2/2 MR causing severe LA dilation. On hospital day 1, we consulted cardiology for their recommendations/consideration of valve replacement. ***   Acute Kidney Injury, prerenal   Improving. Cr 1.8 on admit compared to baseline of 1.1. Renal ultrasound showed no hydronephrosis. Suspect prerenal from increased Lasix use in setting of reduced fluid and PO intake. Exam with 2+ LE edema and LCTAB.  Creatinine was trended throughout his hospitalization and decreased appropriately with holding Lasix and encouraging p.o. intake.  On day of discharge, ***   Class 3 obesity  Pt is highly motivated to lose weight but is doing so in an unhealthy way. Will need more information on healthy and sustainable ways to lose weight. A1c 6, and LDL 130 (not on statin therapy). - Consider dietician

## 2021-12-02 NOTE — Assessment & Plan Note (Signed)
Patient reported worsening SOB over the past two weeks with increased tightness in legs. He reported continued lasix use, denied chest pain. He was able to ambulate throughout the clinic without needing to stop to catch his breath.  6lb weight gain noted since 11/11, however, minimal LEE noted and clear lungs. Unable to accurately assess JVD due to patient body habitus. Advised patient to continue taking his lasix as prescribed.

## 2021-12-02 NOTE — Assessment & Plan Note (Signed)
Patient evaluated for shortness of breath. Reports he was not taking his metoprolol for rate control. Intermittently taking his amiodarone for rhythm control, and not taking his Xarelto for [facility].  Patient did have increased heart rate with irregularly irregular rhythm on exam. Feel his elevated heart rate likely contributing significantly to his shortness of breath. He was able to ambulate without having to stop to catch his breath. Advised patient take all of his medications: diltiazem, metoprolol, amiodarone. Sent new Rx to [facility] as he reported difficulty paying for Xarelto.

## 2021-12-02 NOTE — Plan of Care (Signed)
?  Problem: Elimination: ?Goal: Will not experience complications related to urinary retention ?Outcome: Progressing ?  ?

## 2021-12-03 DIAGNOSIS — I4891 Unspecified atrial fibrillation: Secondary | ICD-10-CM | POA: Diagnosis present

## 2021-12-03 DIAGNOSIS — N179 Acute kidney failure, unspecified: Secondary | ICD-10-CM

## 2021-12-03 LAB — BASIC METABOLIC PANEL
Anion gap: 12 (ref 5–15)
BUN: 46 mg/dL — ABNORMAL HIGH (ref 6–20)
CO2: 23 mmol/L (ref 22–32)
Calcium: 8.4 mg/dL — ABNORMAL LOW (ref 8.9–10.3)
Chloride: 95 mmol/L — ABNORMAL LOW (ref 98–111)
Creatinine, Ser: 1.89 mg/dL — ABNORMAL HIGH (ref 0.61–1.24)
GFR, Estimated: 42 mL/min — ABNORMAL LOW (ref >60)
Glucose, Bld: 135 mg/dL — ABNORMAL HIGH (ref 70–99)
Potassium: 3.7 mmol/L (ref 3.5–5.1)
Sodium: 130 mmol/L — ABNORMAL LOW (ref 135–145)

## 2021-12-03 MED ORDER — FUROSEMIDE 10 MG/ML IJ SOLN
60.0000 mg | Freq: Once | INTRAMUSCULAR | Status: AC
Start: 2021-12-03 — End: 2021-12-03
  Administered 2021-12-03: 60 mg via INTRAVENOUS
  Filled 2021-12-03: qty 6

## 2021-12-03 MED ORDER — HEPARIN (PORCINE) 25000 UT/250ML-% IV SOLN
1800.0000 [IU]/h | INTRAVENOUS | Status: DC
  Administered 2021-12-03: 1500 [IU]/h via INTRAVENOUS
  Administered 2021-12-05: 1800 [IU]/h via INTRAVENOUS
  Filled 2021-12-03 (×4): qty 250

## 2021-12-03 NOTE — Progress Notes (Signed)
Progress Note  Patient Name: Anthony Byrd Date of Encounter: 12/03/2021  Primary Cardiologist: [name], MD   Subjective   Patient is still on 3 L O2. Notes that he has an 10 lbs weight gain over the month of December but thinks all of it is gone. Notes abdominal swelling and cramps today. Notes that he started PO Amiodarone on Tuesday after IM visit.  Inpatient Medications    Scheduled Meds:  amiodarone  400 mg Oral Daily   apixaban  5 mg Oral BID   diltiazem  180 mg Oral Daily   metoprolol tartrate  100 mg Oral BID   multivitamin with minerals  1 tablet Oral Daily   potassium chloride SA  20 mEq Oral Daily   sodium chloride flush  3 mL Intravenous Q12H   Continuous Infusions:  PRN Meds: acetaminophen **OR** acetaminophen, polyethylene glycol   Vital Signs    Vitals:   12/03/21 0006 12/03/21 0514 12/03/21 0645 12/03/21 0923  BP:  96/73  (!) 158/140  Pulse:  93  (!) 103  Resp:  19  20  Temp:  98.5 F (36.9 C)  (!) 97.5 F (36.4 C)  TempSrc:  Oral  Oral  SpO2: 94% 94%  90%  Weight:   132.2 kg   Height:        Intake/Output Summary (Last 24 hours) at 12/03/2021 0945 Last data filed at 12/03/2021 0844 Gross per 24 hour  Intake 2000 ml  Output 2150 ml  Net -150 ml   Filed Weights   12/01/21 2041 12/03/21 0645  Weight: 133.8 kg 132.2 kg    Telemetry    AF Rates ~ 100 - Personally Reviewed  ECG    AF RVR - Personally Reviewed  Physical Exam   Gen: Mild distress, Morbid Obesity   Neck: Thick neck, difficult JVD assessment Cardiac: No Rubs or Gallops, no systolic murmur but distant heart sounds, IRIR tachycardia, +2 radial pulses Respiratory: Decreased breath sounds bilaterally,RR 22 (manual) GI: Soft, nontenderly distended with dullness to percussion MS: +1 bilateral edema;  moves all extremities Integument: Skin feels warm Neuro:  At time of evaluation, alert and oriented to person/place/time; he appears to have limited understanding of  his medical conditions Psych: Normal affect, patient feels better than admission   Labs    Chemistry Recent Labs  Lab 12/01/21 1023 12/02/21 0437 12/03/21 0236  NA 136 134* 130*  K 3.7 3.5 3.7  CL 99 98 95*  CO2 24 25 23   GLUCOSE 138* 138* 135*  BUN 31* 36* 46*  CREATININE 1.85* 1.53* 1.89*  CALCIUM 9.2 8.6* 8.4*  PROT 6.7  --   --   ALBUMIN 3.2*  --   --   AST 44*  --   --   ALT 41  --   --   ALKPHOS 61  --   --   BILITOT 1.4*  --   --   GFRNONAA 43* 54* 42*  ANIONGAP 13 11 12      Hematology Recent Labs  Lab 12/01/21 1023 12/02/21 0437  WBC 11.3* 10.4  RBC 6.02* 5.40  HGB 17.2* 15.6  HCT 52.5* 47.4  MCV 87.2 87.8  MCH 28.6 28.9  MCHC 32.8 32.9  RDW 15.5 15.3  PLT 255 217    Cardiac EnzymesNo results for input(s): TROPONINI in the last 168 hours. No results for input(s): TROPIPOC in the last 168 hours.   BNP Recent Labs  Lab 11/29/21 1232 12/01/21 1023  BNP  55.1 102.6*     DDimer No results for input(s): DDIMER in the last 168 hours.   Radiology    DG Chest 2 View  Result Date: 12/01/2021 CLINICAL DATA:  Chest pain for 2 days. EXAM: CHEST - 2 VIEW COMPARISON:  07/09/2020 FINDINGS: Both views are mildly degraded by patient body habitus. Midline trachea. Mild cardiomegaly. Trace left pleural thickening blunts the costophrenic angle. No pneumothorax. Moderate pulmonary interstitial thickening. IMPRESSION: Given differences in technique, similar appearance of cardiomegaly and moderate pulmonary interstitial thickening. Favor mild interstitial edema. Electronically Signed   By: Abigail Miyamoto M.D.   On: 12/01/2021 10:51   US RENAL  Result Date: 12/02/2021 CLINICAL DATA:  Acute kidney injury. EXAM: RENAL / URINARY TRACT ULTRASOUND COMPLETE Difficult exam due to: Large size of patient. COMPARISON:  Renal ultrasound 08/29/2019, CT abdomen and pelvis no contrast 07/04/2020 FINDINGS: Right Kidney: Renal measurements: 16.1 x 7.6 x 6.2 cm = volume: 394.44 mL.  Echogenicity within normal limits with mild cortical thinning again noted. No mass or hydronephrosis visualized. There are multiple scattered collecting system calculi. Largest is in the upper pole measuring up to 2.6 cm. Left Kidney: Renal measurements: 15.8 x 7.7 x 6.3 cm = volume: 398.93 mL. Echogenicity within normal limits but again with mild cortical thinning. No mass, stone or hydronephrosis visualized. Bladder: Appears normal for degree of bladder distention. Ureteral jets were not evaluated for. Other: no free fluid is seen. IMPRESSION: 1. Nonobstructing right intrarenal stones. 2. Mild cortical thinning. 3. No increased cortical echogenicity or hydronephrosis. Electronically Signed   By: Telford Nab M.D.   On: 12/02/2021 00:51   ECHOCARDIOGRAM COMPLETE  Result Date: 12/02/2021    ECHOCARDIOGRAM REPORT   Patient Name:   Anthony Byrd Date of Exam: 12/02/2021 Medical Rec #:  [id number]      Height:       69.0 in Accession #:    [id number]     Weight:       295.0 lb Date of Birth:  Jul 17, 1971      BSA:          2.437 m Patient Age:    3 years       BP:           109/88 mmHg Patient Gender: M              HR:           108 bpm. Exam Location:  Inpatient Procedure: 2D Echo, Cardiac Doppler, Color Doppler and Intracardiac            Opacification Agent Indications:    CHF/DOE/MR  History:        Patient has prior history of Echocardiogram examinations, most                 recent 10/05/2021. CHF; Arrythmias:Atrial Fibrillation. MR / TR                 / AFIB W RVR.  Sonographer:    Beryle Beams Referring Phys: [id number] [place]  1. Left ventricular ejection fraction, by estimation, is 50 to 55%. The left ventricle has low normal function. The left ventricle has no regional wall motion abnormalities. There is moderate concentric left ventricular hypertrophy. Diastolic function indeterminant due to Afib.  2. Right ventricular systolic function is mildly reduced. The right ventricular  size is mildly enlarged. Tricuspid regurgitation signal is inadequate for assessing PA pressure.  3. Left atrial size was severely dilated.  4. Right atrial size was mildly dilated.  5. The mitral valve is moderately thickened and calcified with moderate-to-severe mitral annular calcification. Prior TTE in 2021 with A2/P2 prolapse which is difficult to appreciate on current study due to degree of leaflet calcification. There is central mitral regurgitation that is appears moderate vs moderate-to-severe, although, is underappreciated on color doppler  6. The aortic valve was not well visualized. There is moderate calcification of the aortic valve. There is moderate thickening of the aortic valve. Aortic valve regurgitation is trivial. Mild aortic valve stenosis.  7. The inferior vena cava is dilated in size with <50% respiratory variability, suggesting right atrial pressure of 15 mmHg. FINDINGS  Left Ventricle: Left ventricular ejection fraction, by estimation, is 50 to 55%. The left ventricle has low normal function. The left ventricle has no regional wall motion abnormalities. Definity contrast agent was given IV to delineate the left ventricular endocardial borders. The left ventricular internal cavity size was normal in size. There is moderate concentric left ventricular hypertrophy. Diastolic function indeterminant due to Afib. Right Ventricle: The right ventricular size is mildly enlarged. Right vetricular wall thickness was not well visualized. Right ventricular systolic function is mildly reduced. Tricuspid regurgitation signal is inadequate for assessing PA pressure. Left Atrium: Left atrial size was severely dilated. Right Atrium: Right atrial size was mildly dilated. Pericardium: There is no evidence of pericardial effusion. Mitral Valve: The mitral valve is moderately thickened and calcified with moderate-to-severe mitral annular calcification. Prior TTE in 2021 with A2/P2 prolapse which is difficult to  appreciate on current study due to degree of leaflet calcification. There is central mitral regurgitation that appears moderate, although, it is underappreciated on color doppler. The mitral valve is abnormal. There is moderate thickening of the mitral valve leaflet(s). There is moderate calcification of the mitral valve  leaflet(s). Moderate to severe mitral annular calcification. MV peak gradient, 111.5 mmHg. The mean mitral valve gradient is 77.0 mmHg. Tricuspid Valve: The tricuspid valve is normal in structure. Tricuspid valve regurgitation is trivial. Aortic Valve: The aortic valve was not well visualized. There is moderate calcification of the aortic valve. There is moderate thickening of the aortic valve. Aortic valve regurgitation is trivial. Mild aortic stenosis is present. Aortic valve mean gradient measures 12.0 mmHg. Aortic valve peak gradient measures 22.8 mmHg. Aortic valve area, by VTI measures 1.59 cm. Pulmonic Valve: The pulmonic valve was not well visualized. Pulmonic valve regurgitation is trivial. Aorta: The aortic root is normal in size and structure. Venous: The inferior vena cava is dilated in size with less than 50% respiratory variability, suggesting right atrial pressure of 15 mmHg. IAS/Shunts: The atrial septum is grossly normal.  LEFT VENTRICLE PLAX 2D LVIDd:         3.70 cm LVIDs:         2.80 cm LV PW:         1.60 cm LV IVS:        1.20 cm LVOT diam:     2.00 cm LV SV:         54 LV SV Index:   22 LVOT Area:     3.14 cm  LV Volumes (MOD) LV vol d, MOD A2C: 108.0 ml LV vol d, MOD A4C: 113.0 ml LV vol s, MOD A2C: 70.9 ml LV vol s, MOD A4C: 69.3 ml LV SV MOD A2C:     37.1 ml LV SV MOD A4C:     113.0 ml LV SV MOD BP:  40.8 ml RIGHT VENTRICLE             IVC RV S prime:     13.40 cm/s  IVC diam: 3.10 cm TAPSE (M-mode): 1.8 cm LEFT ATRIUM             Index        RIGHT ATRIUM           Index LA diam:        5.60 cm 2.30 cm/m   RA Area:     17.00 cm LA Vol (A2C):   80.5 ml 33.03 ml/m   RA Volume:   45.10 ml  18.51 ml/m LA Vol (A4C):   65.2 ml 26.75 ml/m LA Biplane Vol: 73.4 ml 30.12 ml/m  AORTIC VALVE                     PULMONIC VALVE AV Area (Vmax):    1.45 cm      PV Vmax:       0.31 m/s AV Area (Vmean):   1.40 cm      PV Peak grad:  0.4 mmHg AV Area (VTI):     1.59 cm AV Vmax:           239.00 cm/s AV Vmean:          161.000 cm/s AV VTI:            0.342 m AV Peak Grad:      22.8 mmHg AV Mean Grad:      12.0 mmHg LVOT Vmax:         110.00 cm/s LVOT Vmean:        71.600 cm/s LVOT VTI:          0.173 m LVOT/AV VTI ratio: 0.51  AORTA Ao Root diam: 2.30 cm Ao Asc diam:  2.70 cm MITRAL VALVE              TRICUSPID VALVE MV Area VTI:  0.43 cm    TV Peak grad:   32.9 mmHg MV Peak grad: 111.5 mmHg  TV Mean grad:   25.0 mmHg MV Mean grad: 77.0 mmHg   TV Vmax:        2.87 m/s MV Vmax:      5.28 m/s    TV Vmean:       238.0 cm/s MV Vmean:     420.0 cm/s  TV VTI:         0.75 msec                            SHUNTS                           Systemic VTI:  0.17 m                           Systemic Diam: 2.00 cm Gwyndolyn Kaufman MD Electronically signed by Gwyndolyn Kaufman MD Signature Date/Time: 12/02/2021/12:15:15 PM    Final       Patient Profile     51 y.o. male CKD but with notable mitral and aortic valve calcifications, lost to folow up with AF RVR and HF symptoms  Assessment & Plan    Mod-Severe Mitral regurgitation HFpEF Valvular non rheumatic AF RVR CHADSVASC NA Morbid Obesity OSA on CPAP AKI with normal prior baseline - I think TEE DCCV will  be an important part of his plan as I he may not tolerate AF (persistent AF unclear duration) - will work on rate control today (consolidation to dilatiazem 360 mg PO daily in the AM and may be able to stop/decrease metoprolol) - would plan for [facility] Monday (if AKI significantly worsens then just RHC) and TEE/DCCV through this admission - at some point he will need valve surgery +/-MAZE LAA ligation - continue amiodarone - May  just consolidate to diltiazem 360 mg PO daily - I suspect we have unmasked stom underlying CKD, imaging is difficult - will switch to heparin in the setting of planned invasive procedure, if Mitral valve does not appear rheumatic will discuss the pros and cons of DOAC vs coumadin (also will need MS eval) - lasix 60 IV X1  Patient is consented for above procedures Discussed with Primary team  For questions or updates, please contact [facility] Please consult [email or web address] for contact info under Cardiology/STEMI.      Signed, Werner Lean, MD  12/03/2021, 9:45 AM
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
## 2021-12-03 NOTE — Plan of Care (Signed)
°  Problem: Health Behavior/Discharge Planning: Goal: Ability to manage health-related needs will improve Outcome: Progressing   Problem: Clinical Measurements: Goal: Ability to maintain clinical measurements within normal limits will improve Outcome: Progressing Goal: Will remain free from infection Outcome: Progressing Goal: Respiratory complications will improve Outcome: Progressing Goal: Cardiovascular complication will be avoided Outcome: Progressing   Problem: Elimination: Goal: Will not experience complications related to bowel motility Outcome: Progressing

## 2021-12-03 NOTE — Progress Notes (Addendum)
HD#0 Subjective:  Overnight Events: no event  Patient seen at bedside.  Anthony Byrd reports feeling well but still short of breath.  Reported good urine output overnight.  No other complaints.  Objective:  Vital signs in last 24 hours: Vitals:   12/02/21 1758 12/02/21 2059 12/03/21 0006 12/03/21 0514  BP: 123/85 134/66  96/73  Pulse: (!) 110 66  93  Resp: 18 19  19   Temp: 98.6 F (37 C) 99.1 F (37.3 C)  98.5 F (36.9 C)  TempSrc: Oral Oral  Oral  SpO2: 91% 97% 94% 94%  Weight:      Height:       Supplemental O2: Nasal Cannula SpO2: 94 % O2 Flow Rate (L/min): 3 L/min   Physical Exam:  Physical Exam Constitutional:      General: Anthony Byrd is not in acute distress.    Appearance: Anthony Byrd is not ill-appearing.  HENT:     Head: Normocephalic.  Eyes:     General:        Right eye: No discharge.        Left eye: No discharge.     Conjunctiva/sclera: Conjunctivae normal.  Cardiovascular:     Rate and Rhythm: Normal rate. Rhythm irregular.     Comments: +1 bilateral lower extremity edema.  No JVD Pulmonary:     Effort: Pulmonary effort is normal.     Breath sounds: Normal breath sounds.     Comments: No crackles or wheezing Skin:    General: Skin is warm.  Neurological:     Mental Status: Anthony Byrd is alert.  Psychiatric:        Mood and Affect: Mood normal.        Behavior: Behavior normal.    Filed Weights   12/01/21 2041  Weight: 133.8 kg     Intake/Output Summary (Last 24 hours) at 12/03/2021 0634 Last data filed at 12/03/2021 0600 Gross per 24 hour  Intake 1200 ml  Output 1550 ml  Net -350 ml   Net IO Since Admission: -350 mL [12/03/21 0634]  Pertinent Labs: CBC Latest Ref Rng & Units 12/02/2021 12/01/2021 02/01/2021  WBC 4.0 - 10.5 K/uL 10.4 11.3(H) 8.2  Hemoglobin 13.0 - 17.0 g/dL 15.6 17.2(H) 18.0(H)  Hematocrit 39.0 - 52.0 % 47.4 52.5(H) 53.7(H)  Platelets 150 - 400 K/uL 217 255 217    CMP Latest Ref Rng & Units 12/03/2021 12/02/2021 12/01/2021  Glucose 70 - 99 mg/dL  135(H) 138(H) 138(H)  BUN 6 - 20 mg/dL 46(H) 36(H) 31(H)  Creatinine 0.61 - 1.24 mg/dL 1.89(H) 1.53(H) 1.85(H)  Sodium 135 - 145 mmol/L 130(L) 134(L) 136  Potassium 3.5 - 5.1 mmol/L 3.7 3.5 3.7  Chloride 98 - 111 mmol/L 95(L) 98 99  CO2 22 - 32 mmol/L 23 25 24   Calcium 8.9 - 10.3 mg/dL 8.4(L) 8.6(L) 9.2  Total Protein 6.5 - 8.1 g/dL - - 6.7  Total Bilirubin 0.3 - 1.2 mg/dL - - 1.4(H)  Alkaline Phos 38 - 126 U/L - - 61  AST 15 - 41 U/L - - 44(H)  ALT 0 - 44 U/L - - 41    Imaging: ECHOCARDIOGRAM COMPLETE  Result Date: 12/02/2021    ECHOCARDIOGRAM REPORT   Patient Name:   Anthony Byrd Date of Exam: 12/02/2021 Medical Rec #:  [id number]      Height:       69.0 in Accession #:    [id number]     Weight:       295.0 lb  Date of Birth:  Aug 24, 1971      BSA:          2.437 m Patient Age:    51 years       BP:           109/88 mmHg Patient Gender: M              HR:           108 bpm. Exam Location:  Inpatient Procedure: 2D Echo, Cardiac Doppler, Color Doppler and Intracardiac            Opacification Agent Indications:    CHF/DOE/MR  History:        Patient has prior history of Echocardiogram examinations, most                 recent 10/05/2021. CHF; Arrythmias:Atrial Fibrillation. MR / TR                 / AFIB W RVR.  Sonographer:    Beryle Beams Referring Phys: [id number] [place]  1. Left ventricular ejection fraction, by estimation, is 50 to 55%. The left ventricle has low normal function. The left ventricle has no regional wall motion abnormalities. There is moderate concentric left ventricular hypertrophy. Diastolic function indeterminant due to Afib.  2. Right ventricular systolic function is mildly reduced. The right ventricular size is mildly enlarged. Tricuspid regurgitation signal is inadequate for assessing PA pressure.  3. Left atrial size was severely dilated.  4. Right atrial size was mildly dilated.  5. The mitral valve is moderately thickened and calcified with  moderate-to-severe mitral annular calcification. Prior TTE in 2021 with A2/P2 prolapse which is difficult to appreciate on current study due to degree of leaflet calcification. There is central mitral regurgitation that is appears moderate vs moderate-to-severe, although, is underappreciated on color doppler  6. The aortic valve was not well visualized. There is moderate calcification of the aortic valve. There is moderate thickening of the aortic valve. Aortic valve regurgitation is trivial. Mild aortic valve stenosis.  7. The inferior vena cava is dilated in size with <50% respiratory variability, suggesting right atrial pressure of 15 mmHg. FINDINGS  Left Ventricle: Left ventricular ejection fraction, by estimation, is 50 to 55%. The left ventricle has low normal function. The left ventricle has no regional wall motion abnormalities. Definity contrast agent was given IV to delineate the left ventricular endocardial borders. The left ventricular internal cavity size was normal in size. There is moderate concentric left ventricular hypertrophy. Diastolic function indeterminant due to Afib. Right Ventricle: The right ventricular size is mildly enlarged. Right vetricular wall thickness was not well visualized. Right ventricular systolic function is mildly reduced. Tricuspid regurgitation signal is inadequate for assessing PA pressure. Left Atrium: Left atrial size was severely dilated. Right Atrium: Right atrial size was mildly dilated. Pericardium: There is no evidence of pericardial effusion. Mitral Valve: The mitral valve is moderately thickened and calcified with moderate-to-severe mitral annular calcification. Prior TTE in 2021 with A2/P2 prolapse which is difficult to appreciate on current study due to degree of leaflet calcification. There is central mitral regurgitation that appears moderate, although, it is underappreciated on color doppler. The mitral valve is abnormal. There is moderate thickening of the  mitral valve leaflet(s). There is moderate calcification of the mitral valve  leaflet(s). Moderate to severe mitral annular calcification. MV peak gradient, 111.5 mmHg. The mean mitral valve gradient is 77.0 mmHg. Tricuspid Valve: The tricuspid valve is normal in structure.  Tricuspid valve regurgitation is trivial. Aortic Valve: The aortic valve was not well visualized. There is moderate calcification of the aortic valve. There is moderate thickening of the aortic valve. Aortic valve regurgitation is trivial. Mild aortic stenosis is present. Aortic valve mean gradient measures 12.0 mmHg. Aortic valve peak gradient measures 22.8 mmHg. Aortic valve area, by VTI measures 1.59 cm. Pulmonic Valve: The pulmonic valve was not well visualized. Pulmonic valve regurgitation is trivial. Aorta: The aortic root is normal in size and structure. Venous: The inferior vena cava is dilated in size with less than 50% respiratory variability, suggesting right atrial pressure of 15 mmHg. IAS/Shunts: The atrial septum is grossly normal.  LEFT VENTRICLE PLAX 2D LVIDd:         3.70 cm LVIDs:         2.80 cm LV PW:         1.60 cm LV IVS:        1.20 cm LVOT diam:     2.00 cm LV SV:         54 LV SV Index:   22 LVOT Area:     3.14 cm  LV Volumes (MOD) LV vol d, MOD A2C: 108.0 ml LV vol d, MOD A4C: 113.0 ml LV vol s, MOD A2C: 70.9 ml LV vol s, MOD A4C: 69.3 ml LV SV MOD A2C:     37.1 ml LV SV MOD A4C:     113.0 ml LV SV MOD BP:      40.8 ml RIGHT VENTRICLE             IVC RV S prime:     13.40 cm/s  IVC diam: 3.10 cm TAPSE (M-mode): 1.8 cm LEFT ATRIUM             Index        RIGHT ATRIUM           Index LA diam:        5.60 cm 2.30 cm/m   RA Area:     17.00 cm LA Vol (A2C):   80.5 ml 33.03 ml/m  RA Volume:   45.10 ml  18.51 ml/m LA Vol (A4C):   65.2 ml 26.75 ml/m LA Biplane Vol: 73.4 ml 30.12 ml/m  AORTIC VALVE                     PULMONIC VALVE AV Area (Vmax):    1.45 cm      PV Vmax:       0.31 m/s AV Area (Vmean):   1.40 cm       PV Peak grad:  0.4 mmHg AV Area (VTI):     1.59 cm AV Vmax:           239.00 cm/s AV Vmean:          161.000 cm/s AV VTI:            0.342 m AV Peak Grad:      22.8 mmHg AV Mean Grad:      12.0 mmHg LVOT Vmax:         110.00 cm/s LVOT Vmean:        71.600 cm/s LVOT VTI:          0.173 m LVOT/AV VTI ratio: 0.51  AORTA Ao Root diam: 2.30 cm Ao Asc diam:  2.70 cm MITRAL VALVE              TRICUSPID VALVE MV Area VTI:  0.43  cm    TV Peak grad:   32.9 mmHg MV Peak grad: 111.5 mmHg  TV Mean grad:   25.0 mmHg MV Mean grad: 77.0 mmHg   TV Vmax:        2.87 m/s MV Vmax:      5.28 m/s    TV Vmean:       238.0 cm/s MV Vmean:     420.0 cm/s  TV VTI:         0.75 msec                            SHUNTS                           Systemic VTI:  0.17 m                           Systemic Diam: 2.00 cm Anthony Kaufman MD Electronically signed by Anthony Kaufman MD Signature Date/Time: 12/02/2021/12:15:15 PM    Final     Assessment/Plan:   Principal Problem:   AKI (acute kidney injury) ([place]) Active Problems:   Atrial fibrillation ([place])   DOE (dyspnea on exertion)   Patient Summary: Anthony Byrd is a 51 y.o. with a pertinent PMH of persistent atrial fibrillation s/p cardioversion in 2021 and not on anticoagulation, HFrEF (EF 55-65 in 2021), moderate-severe mitral regurgitation, and OSA not on CPAP, who presented with SOB and nausea and admitted for AKI and Afib with RVR.   Persistent valvular Atrial Fibrillation with RVR Severe nonrheumatic mitral regurgitation Heart rate has improved overnight.  Plan [facility] and cardioversion next week per cardiology.  Anthony Byrd is high risk for reenter A. fib given his severe LA dilation 2/2 severe MR. -Appreciate cardiology recommendation -Continue amiodarone 4 mg, diltiazem 180 mg and Lopressor 100 mg BID.  Possible stopping metoprolol and consolidate to Dil 360 mg. -Telemetry -Holding Eliquis and start heparin for procedure  HFpEF exacerbation Put out 1500 cc of fluid  and weight trending down from 295-291 pounds.  Appears mildly volume overloaded on exam with LE edema.  -IV Lasix 60 mg per cardiology -Strict I's/O -Daily weight   Acute Kidney Injury Prerenal vs cardiorenal.  Creatinine bumped back up from 1.5-1.89. -Continue Lasix - Encourage PO intake  - Trend BMP - Avoid nephrotoxic agents    Best Practice: Diet: Cardiac diet IVF: Fluids: None, Rate: None VTE:   Heparin Code: Full AB: None DISPO: Anticipated discharge pending cardiology evaluation and medical stability  Gaylan Gerold, DO 12/03/2021, 6:34 AM Pager: [phone]  Please contact the on call pager after 5 pm and on weekends at [phone].

## 2021-12-04 ENCOUNTER — Inpatient Hospital Stay (HOSPITAL_COMMUNITY): Payer: Self-pay

## 2021-12-04 LAB — BASIC METABOLIC PANEL
Anion gap: 12 (ref 5–15)
BUN: 42 mg/dL — ABNORMAL HIGH (ref 6–20)
CO2: 27 mmol/L (ref 22–32)
Calcium: 8.4 mg/dL — ABNORMAL LOW (ref 8.9–10.3)
Chloride: 94 mmol/L — ABNORMAL LOW (ref 98–111)
Creatinine, Ser: 1.49 mg/dL — ABNORMAL HIGH (ref 0.61–1.24)
GFR, Estimated: 56 mL/min — ABNORMAL LOW (ref >60)
Glucose, Bld: 98 mg/dL (ref 70–99)
Potassium: 4.8 mmol/L (ref 3.5–5.1)
Sodium: 133 mmol/L — ABNORMAL LOW (ref 135–145)

## 2021-12-04 LAB — APTT
aPTT: 39 seconds — ABNORMAL HIGH (ref 24–36)
aPTT: 82 seconds — ABNORMAL HIGH (ref 24–36)

## 2021-12-04 LAB — CBC
HCT: 49 % (ref 39.0–52.0)
Hemoglobin: 16.1 g/dL (ref 13.0–17.0)
MCH: 28.5 pg (ref 26.0–34.0)
MCHC: 32.9 g/dL (ref 30.0–36.0)
MCV: 86.9 fL (ref 80.0–100.0)
Platelets: 215 10*3/uL (ref 150–400)
RBC: 5.64 MIL/uL (ref 4.22–5.81)
RDW: 15.8 % — ABNORMAL HIGH (ref 11.5–15.5)
WBC: 11.9 10*3/uL — ABNORMAL HIGH (ref 4.0–10.5)
nRBC: 0 % (ref 0.0–0.2)

## 2021-12-04 LAB — HEPARIN LEVEL (UNFRACTIONATED): Heparin Unfractionated: >1.1 IU/mL — ABNORMAL HIGH (ref 0.30–0.70)

## 2021-12-04 MED ORDER — DILTIAZEM HCL ER COATED BEADS 180 MG PO CP24: 360.0000 mg | ORAL_CAPSULE | Freq: Every day | ORAL | Status: DC

## 2021-12-04 MED ORDER — BENZONATATE 100 MG PO CAPS
200.0000 mg | ORAL_CAPSULE | Freq: Once | ORAL | Status: AC
  Administered 2021-12-04: 200 mg via ORAL
  Filled 2021-12-04: qty 2

## 2021-12-04 MED ORDER — FUROSEMIDE 10 MG/ML IJ SOLN
60.0000 mg | Freq: Once | INTRAMUSCULAR | Status: AC
  Administered 2021-12-04: 60 mg via INTRAVENOUS
  Filled 2021-12-04: qty 6

## 2021-12-04 MED ORDER — DILTIAZEM HCL ER COATED BEADS 360 MG PO CP24
360.0000 mg | ORAL_CAPSULE | Freq: Every day | ORAL | Status: DC
  Administered 2021-12-04 – 2021-12-08 (×4): 360 mg via ORAL
  Filled 2021-12-04: qty 2
  Filled 2021-12-04: qty 1
  Filled 2021-12-04 (×3): qty 2

## 2021-12-04 MED ORDER — HEPARIN BOLUS VIA INFUSION
3000.0000 [IU] | Freq: Once | INTRAVENOUS | Status: AC
  Administered 2021-12-04: 3000 [IU] via INTRAVENOUS
  Filled 2021-12-04: qty 3000

## 2021-12-04 NOTE — Progress Notes (Signed)
Progress Note  Patient Name: Anthony Byrd Date of Encounter: 12/04/2021  Primary Cardiologist: [name], MD   Subjective   Creatinine has improved. Started on heparin Rates are about the same Notes abdominal cramps.  Inpatient Medications    Scheduled Meds:  amiodarone  400 mg Oral Daily   diltiazem  360 mg Oral Daily   furosemide  60 mg Intravenous Once   metoprolol tartrate  100 mg Oral BID   multivitamin with minerals  1 tablet Oral Daily   potassium chloride SA  20 mEq Oral Daily   sodium chloride flush  3 mL Intravenous Q12H   Continuous Infusions:  heparin 1,800 Units/hr (12/04/21 0846)   PRN Meds: acetaminophen **OR** acetaminophen, polyethylene glycol   Vital Signs    Vitals:   12/03/21 2030 12/04/21 0416 12/04/21 0500 12/04/21 0944  BP: (!) 117/95 126/77  107/66  Pulse: 85 (!) 105  (!) 103  Resp: (!) 22 20  20   Temp: 98.4 F (36.9 C) 98.4 F (36.9 C)  98 F (36.7 C)  TempSrc: Oral Oral  Oral  SpO2: 95% 98%  95%  Weight:   132.3 kg   Height:        Intake/Output Summary (Last 24 hours) at 12/04/2021 1036 Last data filed at 12/04/2021 0846 Gross per 24 hour  Intake 2016.49 ml  Output 2020 ml  Net -3.51 ml   Filed Weights   12/01/21 2041 12/03/21 0645 12/04/21 0500  Weight: 133.8 kg 132.2 kg 132.3 kg    Telemetry    AF Rates ~ 170 - Personally Reviewed  ECG    AF RVR - Personally Reviewed  Physical Exam   Gen: No distress, Morbid Obesity   Neck: Thick neck, difficult JVD assessment Cardiac: No Rubs or Gallops, no systolic murmur but distant heart sounds, IRIR tachycardia, +2 radial pulses Respiratory: Decreased breath sounds bilaterally with rhonchi and tacyhypnea GI: Soft, nontenderly distended with dullness to percussion MS: +1 bilateral edema;  moves all extremities Integument: Skin feels warm Neuro:  At time of evaluation, alert and oriented to person/place/time/situation Psych: Normal affect, patient better but not  back to baseline   Labs    Chemistry Recent Labs  Lab 12/01/21 1023 12/02/21 0437 12/03/21 0236 12/04/21 0718  NA 136 134* 130* 133*  K 3.7 3.5 3.7 4.8  CL 99 98 95* 94*  CO2 24 25 23 27   GLUCOSE 138* 138* 135* 98  BUN 31* 36* 46* 42*  CREATININE 1.85* 1.53* 1.89* 1.49*  CALCIUM 9.2 8.6* 8.4* 8.4*  PROT 6.7  --   --   --   ALBUMIN 3.2*  --   --   --   AST 44*  --   --   --   ALT 41  --   --   --   ALKPHOS 61  --   --   --   BILITOT 1.4*  --   --   --   GFRNONAA 43* 54* 42* 56*  ANIONGAP 13 11 12 12      Hematology Recent Labs  Lab 12/01/21 1023 12/02/21 0437 12/04/21 0718  WBC 11.3* 10.4 11.9*  RBC 6.02* 5.40 5.64  HGB 17.2* 15.6 16.1  HCT 52.5* 47.4 49.0  MCV 87.2 87.8 86.9  MCH 28.6 28.9 28.5  MCHC 32.8 32.9 32.9  RDW 15.5 15.3 15.8*  PLT 255 217 215    Cardiac EnzymesNo results for input(s): TROPONINI in the last 168 hours. No results for input(s):  TROPIPOC in the last 168 hours.   BNP Recent Labs  Lab 11/29/21 1232 12/01/21 1023  BNP 55.1 102.6*     DDimer No results for input(s): DDIMER in the last 168 hours.   Radiology    No results found.  Patient Profile     51 y.o. male CKD but with notable mitral and aortic valve calcifications, lost to folow up with AF RVR and HF symptoms  Assessment & Plan    Mod-Severe Mitral regurgitation HFpEF Valvular non rheumatic AF RVR CHADSVASC NA Morbid Obesity OSA on CPAP AKI with normal prior baseline - I think TEE DCCV will be an important part of his plan as I he may not tolerate AF (persistent AF unclear duration) - diltiazem 360 mg PO daily today - NPO at midnight for [facility] RHC tomorrow - at some point he will need valve surgery +/-MAZE LAA ligation - continue amiodarone - Heparin presently,  if Mitral valve does not appear rheumatic will discuss the pros and cons of DOAC vs coumadin (also will need MS eval) - redose lasix 60 IV X1  Patient is consented for above procedures  For questions  or updates, please contact [facility] Please consult [email or web address] for contact info under Cardiology/STEMI.      Signed, Werner Lean, MD  12/04/2021, 10:36 AM

## 2021-12-04 NOTE — Progress Notes (Signed)
HD#1 Subjective:  Overnight Events: no event  Patient seen at bedside.  He reports feeling well but is still short of breath.  He also complains of new onset abdominal pain that he describes as crampy.  Started yesterday afternoon.  He says it feels like the pain is going across his abdomen on both his left and right sides.  No back pain.  No bladder/bowel issues.  Last bowel movement was yesterday. The pain is not associated with eating.  He does not know his dry weight at home.  No other complaints.  Objective:  Vital signs in last 24 hours: Vitals:   12/03/21 2030 12/04/21 0416 12/04/21 0500 12/04/21 0944  BP: (!) 117/95 126/77  107/66  Pulse: 85 (!) 105  (!) 103  Resp: (!) 22 20  20   Temp: 98.4 F (36.9 C) 98.4 F (36.9 C)  98 F (36.7 C)  TempSrc: Oral Oral  Oral  SpO2: 95% 98%  95%  Weight:   132.3 kg   Height:       Supplemental O2: Nasal Cannula SpO2: 95 % O2 Flow Rate (L/min): 4 L/min   Physical Exam:  Physical Exam Constitutional:      General: He is not in acute distress.    Appearance: He is not ill-appearing.  HENT:     Head: Normocephalic.  Eyes:     General:        Right eye: No discharge.        Left eye: No discharge.     Conjunctiva/sclera: Conjunctivae normal.  Cardiovascular:     Rate and Rhythm: Normal rate. Rhythm irregular.     Comments: +1 bilateral lower extremity edema.  No JVD Pulmonary:     Effort: Pulmonary effort is normal.     Breath sounds: Normal breath sounds.     Comments: No crackles or wheezing Abdominal:     General: Bowel sounds are normal.     Palpations: Abdomen is soft.     Tenderness: There is no abdominal tenderness. There is no guarding or rebound.  Skin:    General: Skin is warm.  Neurological:     General: No focal deficit present.     Mental Status: He is alert.  Psychiatric:        Mood and Affect: Mood normal.        Behavior: Behavior normal.    Filed Weights   12/01/21 2041 12/03/21 0645 12/04/21  0500  Weight: 133.8 kg 132.2 kg 132.3 kg    Intake/Output Summary (Last 24 hours) at 12/04/2021 1120 Last data filed at 12/04/2021 1100 Gross per 24 hour  Intake 1876.49 ml  Output 2370 ml  Net -493.51 ml    Net IO Since Admission: -703.51 mL [12/04/21 1120]  Pertinent Labs: CBC Latest Ref Rng & Units 12/04/2021 12/02/2021 12/01/2021  WBC 4.0 - 10.5 K/uL 11.9(H) 10.4 11.3(H)  Hemoglobin 13.0 - 17.0 g/dL 16.1 15.6 17.2(H)  Hematocrit 39.0 - 52.0 % 49.0 47.4 52.5(H)  Platelets 150 - 400 K/uL 215 217 255    CMP Latest Ref Rng & Units 12/04/2021 12/03/2021 12/02/2021  Glucose 70 - 99 mg/dL 98 135(H) 138(H)  BUN 6 - 20 mg/dL 42(H) 46(H) 36(H)  Creatinine 0.61 - 1.24 mg/dL 1.49(H) 1.89(H) 1.53(H)  Sodium 135 - 145 mmol/L 133(L) 130(L) 134(L)  Potassium 3.5 - 5.1 mmol/L 4.8 3.7 3.5  Chloride 98 - 111 mmol/L 94(L) 95(L) 98  CO2 22 - 32 mmol/L 27 23 25   Calcium 8.9 -  10.3 mg/dL 8.4(L) 8.4(L) 8.6(L)  Total Protein 6.5 - 8.1 g/dL - - -  Total Bilirubin 0.3 - 1.2 mg/dL - - -  Alkaline Phos 38 - 126 U/L - - -  AST 15 - 41 U/L - - -  ALT 0 - 44 U/L - - -    Imaging: No results found.  Assessment/Plan:   Principal Problem:   AKI (acute kidney injury) ([place]) Active Problems:   Atrial fibrillation ([place])   DOE (dyspnea on exertion)   Atrial fibrillation with RVR ([place])   Patient Summary: Anthony Byrd is a 51 y.o. with a pertinent PMH of persistent atrial fibrillation s/p cardioversion in 2021 and not on anticoagulation, HFrEF (EF 55-65 in 2021), moderate-severe mitral regurgitation, and OSA not on CPAP, who presented with SOB and nausea and admitted for AKI and Afib with RVR.   Persistent valvular Atrial Fibrillation with RVR Severe mitral regurgitation Echo showed EF 50-55% with moderate to severe MR. Afib is likely 2/2 MR causing severe LA dilation. Heart rate has improved over the past few days.  Plan for [facility] and cardioversion tomorrow per cardiology. He is high risk for reenter  A. fib given his severe LA dilation 2/2 severe MR. -Appreciate cardiology recommendations -Continue amiodarone 4 mg, diltiazem 360 mg and Lopressor 100 mg BID -Telemetry -Continue heparin  Abdominal pain, new The patient endorses a cramping abdominal pain that started yesterday afternoon. It goes across the sides of his abdomen.  The pain worsens with bending forward, and is not associated with eating. On exam today, bowel sounds were appreciated and the patient did not have tenderness palpation, stating that the abdominal pain resolved before we saw him this morning.  Recent renal ultrasound only showed non-obstructing right renal stones. Items on the differential include biliary colic, abdominal pain related to his volume overload, MSK pain. Will obtain RUQ ultrasound for further evaluation. -RUQ ultrasound pending  HFpEF exacerbation Put out 2250 cc of fluid and weight is trending down from 295 to 291 pounds.  Patient is unsure of his dry weight.  Appears mildly volume overloaded on exam with LE edema.  -IV Lasix 60 mg x 1 today -Strict I's/O -Daily weights   Acute Kidney Injury, improving Prerenal vs cardiorenal. Creatinine of 1.85 on admit, improving with diuresis and most recent Cr at 1.49. - Continue Lasix as above - Encourage PO intake  - Trend BMP - Avoid nephrotoxic agents    Best Practice: Diet: Cardiac diet IVF: Fluids: None, Rate: None VTE:   Heparin Code: Full AB: None DISPO: Anticipated discharge pending cardiology interventions and medical stability  Orvis Brill, MD 12/04/2021, 11:20 AM Pager: [phone]  Please contact the on call pager after 5 pm and on weekends at [phone].
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
## 2021-12-04 NOTE — Plan of Care (Signed)
  Problem: Clinical Measurements: Goal: Diagnostic test results will improve Outcome: Progressing   
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
## 2021-12-05 ENCOUNTER — Encounter (HOSPITAL_COMMUNITY)
Admission: EM | Disposition: A | Discharge status: Other Acute Inpatient Hospital | Payer: Self-pay | Source: Home / Self Care | Attending: Internal Medicine

## 2021-12-05 DIAGNOSIS — I5031 Acute diastolic (congestive) heart failure: Secondary | ICD-10-CM

## 2021-12-05 DIAGNOSIS — I5030 Unspecified diastolic (congestive) heart failure: Secondary | ICD-10-CM

## 2021-12-05 DIAGNOSIS — I340 Nonrheumatic mitral (valve) insufficiency: Secondary | ICD-10-CM

## 2021-12-05 DIAGNOSIS — R0600 Dyspnea, unspecified: Secondary | ICD-10-CM

## 2021-12-05 DIAGNOSIS — I482 Chronic atrial fibrillation, unspecified: Secondary | ICD-10-CM

## 2021-12-05 HISTORY — PX: RIGHT/LEFT HEART CATH AND CORONARY ANGIOGRAPHY: CATH118266

## 2021-12-05 LAB — POCT I-STAT 7, (LYTES, BLD GAS, ICA,H+H)
Acid-Base Excess: 1 mmol/L (ref 0.0–2.0)
Bicarbonate: 25.7 mmol/L (ref 20.0–28.0)
Calcium, Ion: 1 mmol/L — ABNORMAL LOW (ref 1.15–1.40)
HCT: 44 % (ref 39.0–52.0)
Hemoglobin: 15 g/dL (ref 13.0–17.0)
O2 Saturation: 97 %
Potassium: 3.1 mmol/L — ABNORMAL LOW (ref 3.5–5.1)
Sodium: 138 mmol/L (ref 135–145)
TCO2: 27 mmol/L (ref 22–32)
pCO2 arterial: 40.1 mmHg (ref 32.0–48.0)
pH, Arterial: 7.415 (ref 7.350–7.450)
pO2, Arterial: 89 mmHg (ref 83.0–108.0)

## 2021-12-05 LAB — CBC
HCT: 48.7 % (ref 39.0–52.0)
Hemoglobin: 15.8 g/dL (ref 13.0–17.0)
MCH: 28.4 pg (ref 26.0–34.0)
MCHC: 32.4 g/dL (ref 30.0–36.0)
MCV: 87.4 fL (ref 80.0–100.0)
Platelets: 252 10*3/uL (ref 150–400)
RBC: 5.57 MIL/uL (ref 4.22–5.81)
RDW: 15.4 % (ref 11.5–15.5)
WBC: 14 10*3/uL — ABNORMAL HIGH (ref 4.0–10.5)
nRBC: 0 % (ref 0.0–0.2)

## 2021-12-05 LAB — BASIC METABOLIC PANEL
Anion gap: 12 (ref 5–15)
BUN: 49 mg/dL — ABNORMAL HIGH (ref 6–20)
CO2: 21 mmol/L — ABNORMAL LOW (ref 22–32)
Calcium: 8.2 mg/dL — ABNORMAL LOW (ref 8.9–10.3)
Chloride: 98 mmol/L (ref 98–111)
Creatinine, Ser: 1.74 mg/dL — ABNORMAL HIGH (ref 0.61–1.24)
GFR, Estimated: 47 mL/min — ABNORMAL LOW (ref >60)
Glucose, Bld: 133 mg/dL — ABNORMAL HIGH (ref 70–99)
Potassium: 4.2 mmol/L (ref 3.5–5.1)
Sodium: 131 mmol/L — ABNORMAL LOW (ref 135–145)

## 2021-12-05 LAB — POCT I-STAT EG7
Acid-Base Excess: 4 mmol/L — ABNORMAL HIGH (ref 0.0–2.0)
Bicarbonate: 29.8 mmol/L — ABNORMAL HIGH (ref 20.0–28.0)
Calcium, Ion: 1.13 mmol/L — ABNORMAL LOW (ref 1.15–1.40)
HCT: 47 % (ref 39.0–52.0)
Hemoglobin: 16 g/dL (ref 13.0–17.0)
O2 Saturation: 65 %
Potassium: 3.4 mmol/L — ABNORMAL LOW (ref 3.5–5.1)
Sodium: 136 mmol/L (ref 135–145)
TCO2: 31 mmol/L (ref 22–32)
pCO2, Ven: 48.6 mmHg (ref 44.0–60.0)
pH, Ven: 7.396 (ref 7.250–7.430)
pO2, Ven: 35 mmHg (ref 32.0–45.0)

## 2021-12-05 LAB — APTT: aPTT: 67 seconds — ABNORMAL HIGH (ref 24–36)

## 2021-12-05 LAB — POCT ACTIVATED CLOTTING TIME: Activated Clotting Time: 0 seconds

## 2021-12-05 LAB — HEPARIN LEVEL (UNFRACTIONATED): Heparin Unfractionated: >1.1 IU/mL — ABNORMAL HIGH (ref 0.30–0.70)

## 2021-12-05 SURGERY — RIGHT/LEFT HEART CATH AND CORONARY ANGIOGRAPHY
Anesthesia: LOCAL

## 2021-12-05 MED ORDER — HYDRALAZINE HCL 20 MG/ML IJ SOLN: 10.0000 mg | INTRAMUSCULAR | Status: AC | PRN

## 2021-12-05 MED ORDER — SODIUM CHLORIDE 0.9 % IV SOLN: 250.0000 mL | INTRAVENOUS | Status: DC | PRN

## 2021-12-05 MED ORDER — SODIUM CHLORIDE 0.9 % IV SOLN: INTRAVENOUS | Status: DC

## 2021-12-05 MED ORDER — HEPARIN (PORCINE) IN NACL 1000-0.9 UT/500ML-% IV SOLN
INTRAVENOUS | Status: AC
  Filled 2021-12-05: qty 1000

## 2021-12-05 MED ORDER — IOHEXOL 350 MG/ML SOLN
INTRAVENOUS | Status: DC | PRN
  Administered 2021-12-05: 55 mL

## 2021-12-05 MED ORDER — SODIUM CHLORIDE 0.9% FLUSH: 3.0000 mL | INTRAVENOUS | Status: DC | PRN

## 2021-12-05 MED ORDER — FENTANYL CITRATE (PF) 100 MCG/2ML IJ SOLN
INTRAMUSCULAR | Status: AC
  Filled 2021-12-05: qty 2

## 2021-12-05 MED ORDER — HEPARIN (PORCINE) IN NACL 1000-0.9 UT/500ML-% IV SOLN
INTRAVENOUS | Status: DC | PRN
  Administered 2021-12-05 (×2): 500 mL

## 2021-12-05 MED ORDER — MIDAZOLAM HCL 2 MG/2ML IJ SOLN
INTRAMUSCULAR | Status: AC
  Filled 2021-12-05: qty 2

## 2021-12-05 MED ORDER — SODIUM CHLORIDE 0.9% FLUSH
3.0000 mL | Freq: Two times a day (BID) | INTRAVENOUS | Status: DC
  Administered 2021-12-05: 3 mL via INTRAVENOUS

## 2021-12-05 MED ORDER — LIDOCAINE HCL (PF) 1 % IJ SOLN
INTRAMUSCULAR | Status: AC
  Filled 2021-12-05: qty 30

## 2021-12-05 MED ORDER — VERAPAMIL HCL 2.5 MG/ML IV SOLN
INTRAVENOUS | Status: AC
  Filled 2021-12-05: qty 2

## 2021-12-05 MED ORDER — VERAPAMIL HCL 2.5 MG/ML IV SOLN
INTRAVENOUS | Status: DC | PRN
  Administered 2021-12-05: 10 mL via INTRA_ARTERIAL

## 2021-12-05 MED ORDER — HEPARIN SODIUM (PORCINE) 1000 UNIT/ML IJ SOLN
INTRAMUSCULAR | Status: DC | PRN
  Administered 2021-12-05: 6000 [IU] via INTRAVENOUS

## 2021-12-05 MED ORDER — SODIUM CHLORIDE 0.9% FLUSH: 3.0000 mL | Freq: Two times a day (BID) | INTRAVENOUS | Status: DC

## 2021-12-05 MED ORDER — LIDOCAINE HCL (PF) 1 % IJ SOLN
INTRAMUSCULAR | Status: DC | PRN
  Administered 2021-12-05 (×2): 2 mL

## 2021-12-05 MED ORDER — LABETALOL HCL 5 MG/ML IV SOLN: 10.0000 mg | INTRAVENOUS | Status: AC | PRN

## 2021-12-05 MED ORDER — HEPARIN (PORCINE) 25000 UT/250ML-% IV SOLN
1800.0000 [IU]/h | INTRAVENOUS | Status: DC
  Administered 2021-12-05 – 2021-12-06 (×2): 1800 [IU]/h via INTRAVENOUS
  Filled 2021-12-05: qty 250

## 2021-12-05 MED ORDER — HEPARIN SODIUM (PORCINE) 1000 UNIT/ML IJ SOLN
INTRAMUSCULAR | Status: AC
  Filled 2021-12-05: qty 10

## 2021-12-05 MED ORDER — FENTANYL CITRATE (PF) 100 MCG/2ML IJ SOLN
INTRAMUSCULAR | Status: DC | PRN
  Administered 2021-12-05: 25 ug via INTRAVENOUS

## 2021-12-05 MED ORDER — ASPIRIN 81 MG PO CHEW
81.0000 mg | CHEWABLE_TABLET | ORAL | Status: AC
  Administered 2021-12-05: 81 mg via ORAL
  Filled 2021-12-05: qty 1

## 2021-12-05 MED ORDER — MIDAZOLAM HCL 2 MG/2ML IJ SOLN
INTRAMUSCULAR | Status: DC | PRN
  Administered 2021-12-05: 1 mg via INTRAVENOUS

## 2021-12-05 SURGICAL SUPPLY — 12 items

## 2021-12-05 NOTE — Progress Notes (Signed)
Progress Note  Patient Name: Anthony Byrd Date of Encounter: 12/05/2021  Primary Cardiologist: [name], MD   Subjective    Breathing is ok sitting in chair  Denies CP    Patient is still on 3 L O2. Inpatient Medications    Scheduled Meds:  amiodarone  400 mg Oral Daily   aspirin  81 mg Oral Pre-Cath   diltiazem  360 mg Oral Daily   metoprolol tartrate  100 mg Oral BID   multivitamin with minerals  1 tablet Oral Daily   potassium chloride SA  20 mEq Oral Daily   sodium chloride flush  3 mL Intravenous Q12H   sodium chloride flush  3 mL Intravenous Q12H   Continuous Infusions:  sodium chloride     sodium chloride     heparin 1,800 Units/hr (12/05/21 0136)   PRN Meds: sodium chloride, acetaminophen **OR** acetaminophen, polyethylene glycol, sodium chloride flush   Vital Signs    Vitals:   12/04/21 0944 12/04/21 1635 12/04/21 2148 12/05/21 0726  BP: 107/66 90/72 122/82   Pulse: (!) 103 89 97   Resp: 20 17 20    Temp: 98 F (36.7 C) 98.4 F (36.9 C)    TempSrc: Oral Oral    SpO2: 95% 93% 97%   Weight:    131.8 kg  Height:        Intake/Output Summary (Last 24 hours) at 12/05/2021 0739 Last data filed at 12/04/2021 2331 Gross per 24 hour  Intake 460 ml  Output 2270 ml  Net -1810 ml   Filed Weights   12/03/21 0645 12/04/21 0500 12/05/21 0726  Weight: 132.2 kg 132.3 kg 131.8 kg    Telemetry    AF Rates  90s  - Personally Reviewed  ECG    No new      Physical Exam   Gen: No acute distress    Sitting in chair   Neck: Diffiuclt to assess JVP given size / full neck  Cardiac   Irreg irreg  No S3   Distant   Respiratory: Relatively CTA  GI: Obese   Non tender   MS: Triv LE edema   Integument: Skin feels warm  Chronic skin changes legs   Neuro:  A and O x 3     Labs    Chemistry Recent Labs  Lab 12/01/21 1023 12/02/21 0437 12/03/21 0236 12/04/21 0718 12/05/21 0444  NA 136   < > 130* 133* 131*  K 3.7   < > 3.7 4.8 4.2  CL 99   < >  95* 94* 98  CO2 24   < > 23 27 21*  GLUCOSE 138*   < > 135* 98 133*  BUN 31*   < > 46* 42* 49*  CREATININE 1.85*   < > 1.89* 1.49* 1.74*  CALCIUM 9.2   < > 8.4* 8.4* 8.2*  PROT 6.7  --   --   --   --   ALBUMIN 3.2*  --   --   --   --   AST 44*  --   --   --   --   ALT 41  --   --   --   --   ALKPHOS 61  --   --   --   --   BILITOT 1.4*  --   --   --   --   GFRNONAA 43*   < > 42* 56* 47*  ANIONGAP 13   < >  12 12 12    < > = values in this interval not displayed.     Hematology Recent Labs  Lab 12/02/21 0437 12/04/21 0718 12/05/21 0444  WBC 10.4 11.9* 14.0*  RBC 5.40 5.64 5.57  HGB 15.6 16.1 15.8  HCT 47.4 49.0 48.7  MCV 87.8 86.9 87.4  MCH 28.9 28.5 28.4  MCHC 32.9 32.9 32.4  RDW 15.3 15.8* 15.4  PLT 217 215 252    Cardiac EnzymesNo results for input(s): TROPONINI in the last 168 hours. No results for input(s): TROPIPOC in the last 168 hours.   BNP Recent Labs  Lab 11/29/21 1232 12/01/21 1023  BNP 55.1 102.6*     DDimer No results for input(s): DDIMER in the last 168 hours.   Radiology    US Abdomen Limited RUQ (LIVER/GB)  Result Date: 12/04/2021 CLINICAL DATA:  Abdominal pain EXAM: ULTRASOUND ABDOMEN LIMITED RIGHT UPPER QUADRANT COMPARISON:  07/04/2020 FINDINGS: Gallbladder: Partially contracted. No pericholecystic fluid is noted. Negative sonographic Murphy's sign is elicited. Common bile duct: Diameter: 5 mm. Liver: Increased in echogenicity without focal mass. Portal vein is patent on color Doppler imaging with normal direction of blood flow towards the liver. Other: None. IMPRESSION: Fatty infiltration of the liver. No acute abnormality is noted. Electronically Signed   By: Inez Catalina M.D.   On: 12/04/2021 19:27     Cardiac studies    Echo 12/02/21 Left ventricular ejection fraction, by estimation, is 50 to 55%. The left ventricle has low normal function. The left ventricle has no regional wall motion abnormalities. There is moderate concentric left  ventricular hypertrophy. Diastolic function indeterminant due to Afib. 1. Right ventricular systolic function is mildly reduced. The right ventricular size is mildly enlarged. Tricuspid regurgitation signal is inadequate for assessing PA pressure. 2. 3. Left atrial size was severely dilated. 4. Right atrial size was mildly dilated. The mitral valve is moderately thickened and calcified with moderate-to-severe mitral annular calcification. Prior TTE in 2021 with A2/P2 prolapse which is difficult to appreciate on current study due to degree of leaflet calcification. There is central mitral regurgitation that is appears moderate vs moderate-to-severe, although, is underappreciated on color doppler 5. The aortic valve was not well visualized. There is moderate calcification of the aortic valve. There is moderate thickening of the aortic valve. Aortic valve regurgitation is trivial. Mild aortic valve stenosis. 6. The inferior vena cava is dilated in size with <50% respiratory variability, suggesting right atrial pressure of 15 mmHg.  Patient Profile     51 y.o. male CKD but with notable mitral and aortic valve calcifications, lost to folow up with AF RVR and HF symptoms  Assessment & Plan    HFpEF Mod-Severe Mitral regurgitation Afib  wit hRVR   Morbid Obesity Fatty liver   OSA on CPAP AKI   Dyspnea    Pt admitted on 1/12 with SOB  had been out of meds    Progressive worsening of breathing   Wt up 10 lb In ED was hypotensive and in afib wit hRVR   stated on amiodarone     Here in hsop he has been treated with po amiodarone    Give prn lasix      Today plan on R and L heart catheterization   Note Cr 1.7   Will give IVF this am      Minimal contrast   no LV gram   Afib   This hs been a chronic problem   Now fast on admit  On PO amiodarone 400 daily as well as diltiazem 360 and metoprolol 100 bid    Previous discussoin on TEE / Cardioversion    Not sure if he would maintatin  SR   Will assess after cath Keep on heparin   Mitral valve diz    MR is at least moderately severe   Would benefit from reevaluation  wit hTEE   At some point will need repair  OSA   Pt on CPAP  Metabolic syndrom   Pt does not have dx of DM but has metabolic syndrome   Liver with fatty changes   Discussed diet: Skips breakfast   Lunch   Chicken sandwich   Dinner   Chickne and veggies   Does eat some bread Drinks 2 cans ginger ale per day   Discussed diet   He neds to stop sugars   Minimalize carbs   Would do better on keto type diet  For questions or updates, please contact [facility] Please consult [email or web address] for contact info under Cardiology/STEMI.      Signed, Dorris Carnes, MD  12/05/2021, 7:39 AM

## 2021-12-05 NOTE — Progress Notes (Signed)
° °  R and L Heart cath   12/05/21  No angiographic evidence of CAD Elevated right and left heart pressures. RA 17, RV 52/12/18, PA 61/29 mean 47, PCWP 33, LV 116/15/24, AO 111/85)    Will reassess renal function in am   It would be good to diurese more before TEE   he had one in past and developed resp distress    Signed, Dorris Carnes, MD  12/05/2021, 2:55 PM

## 2021-12-05 NOTE — H&P (View-Only) (Signed)
Progress Note  Patient Name: Anthony Byrd Date of Encounter: 12/05/2021  Primary Cardiologist: [name], MD   Subjective    Breathing is ok sitting in chair  Denies CP    Patient is still on 3 L O2. Inpatient Medications    Scheduled Meds:  amiodarone  400 mg Oral Daily   aspirin  81 mg Oral Pre-Cath   diltiazem  360 mg Oral Daily   metoprolol tartrate  100 mg Oral BID   multivitamin with minerals  1 tablet Oral Daily   potassium chloride SA  20 mEq Oral Daily   sodium chloride flush  3 mL Intravenous Q12H   sodium chloride flush  3 mL Intravenous Q12H   Continuous Infusions:  sodium chloride     sodium chloride     heparin 1,800 Units/hr (12/05/21 0136)   PRN Meds: sodium chloride, acetaminophen **OR** acetaminophen, polyethylene glycol, sodium chloride flush   Vital Signs    Vitals:   12/04/21 0944 12/04/21 1635 12/04/21 2148 12/05/21 0726  BP: 107/66 90/72 122/82   Pulse: (!) 103 89 97   Resp: 20 17 20    Temp: 98 F (36.7 C) 98.4 F (36.9 C)    TempSrc: Oral Oral    SpO2: 95% 93% 97%   Weight:    131.8 kg  Height:        Intake/Output Summary (Last 24 hours) at 12/05/2021 0739 Last data filed at 12/04/2021 2331 Gross per 24 hour  Intake 460 ml  Output 2270 ml  Net -1810 ml   Filed Weights   12/03/21 0645 12/04/21 0500 12/05/21 0726  Weight: 132.2 kg 132.3 kg 131.8 kg    Telemetry    AF Rates  90s  - Personally Reviewed  ECG    No new      Physical Exam   Gen: No acute distress    Sitting in chair   Neck: Diffiuclt to assess JVP given size / full neck  Cardiac   Irreg irreg  No S3   Distant   Respiratory: Relatively CTA  GI: Obese   Non tender   MS: Triv LE edema   Integument: Skin feels warm  Chronic skin changes legs   Neuro:  A and O x 3     Labs    Chemistry Recent Labs  Lab 12/01/21 1023 12/02/21 0437 12/03/21 0236 12/04/21 0718 12/05/21 0444  NA 136   < > 130* 133* 131*  K 3.7   < > 3.7 4.8 4.2  CL 99   < >  95* 94* 98  CO2 24   < > 23 27 21*  GLUCOSE 138*   < > 135* 98 133*  BUN 31*   < > 46* 42* 49*  CREATININE 1.85*   < > 1.89* 1.49* 1.74*  CALCIUM 9.2   < > 8.4* 8.4* 8.2*  PROT 6.7  --   --   --   --   ALBUMIN 3.2*  --   --   --   --   AST 44*  --   --   --   --   ALT 41  --   --   --   --   ALKPHOS 61  --   --   --   --   BILITOT 1.4*  --   --   --   --   GFRNONAA 43*   < > 42* 56* 47*  ANIONGAP 13   < >  12 12 12    < > = values in this interval not displayed.     Hematology Recent Labs  Lab 12/02/21 0437 12/04/21 0718 12/05/21 0444  WBC 10.4 11.9* 14.0*  RBC 5.40 5.64 5.57  HGB 15.6 16.1 15.8  HCT 47.4 49.0 48.7  MCV 87.8 86.9 87.4  MCH 28.9 28.5 28.4  MCHC 32.9 32.9 32.4  RDW 15.3 15.8* 15.4  PLT 217 215 252    Cardiac EnzymesNo results for input(s): TROPONINI in the last 168 hours. No results for input(s): TROPIPOC in the last 168 hours.   BNP Recent Labs  Lab 11/29/21 1232 12/01/21 1023  BNP 55.1 102.6*     DDimer No results for input(s): DDIMER in the last 168 hours.   Radiology    US Abdomen Limited RUQ (LIVER/GB)  Result Date: 12/04/2021 CLINICAL DATA:  Abdominal pain EXAM: ULTRASOUND ABDOMEN LIMITED RIGHT UPPER QUADRANT COMPARISON:  07/04/2020 FINDINGS: Gallbladder: Partially contracted. No pericholecystic fluid is noted. Negative sonographic Murphy's sign is elicited. Common bile duct: Diameter: 5 mm. Liver: Increased in echogenicity without focal mass. Portal vein is patent on color Doppler imaging with normal direction of blood flow towards the liver. Other: None. IMPRESSION: Fatty infiltration of the liver. No acute abnormality is noted. Electronically Signed   By: Inez Catalina M.D.   On: 12/04/2021 19:27     Cardiac studies    Echo 12/02/21 Left ventricular ejection fraction, by estimation, is 50 to 55%. The left ventricle has low normal function. The left ventricle has no regional wall motion abnormalities. There is moderate concentric left  ventricular hypertrophy. Diastolic function indeterminant due to Afib. 1. Right ventricular systolic function is mildly reduced. The right ventricular size is mildly enlarged. Tricuspid regurgitation signal is inadequate for assessing PA pressure. 2. 3. Left atrial size was severely dilated. 4. Right atrial size was mildly dilated. The mitral valve is moderately thickened and calcified with moderate-to-severe mitral annular calcification. Prior TTE in 2021 with A2/P2 prolapse which is difficult to appreciate on current study due to degree of leaflet calcification. There is central mitral regurgitation that is appears moderate vs moderate-to-severe, although, is underappreciated on color doppler 5. The aortic valve was not well visualized. There is moderate calcification of the aortic valve. There is moderate thickening of the aortic valve. Aortic valve regurgitation is trivial. Mild aortic valve stenosis. 6. The inferior vena cava is dilated in size with <50% respiratory variability, suggesting right atrial pressure of 15 mmHg.  Patient Profile     51 y.o. male CKD but with notable mitral and aortic valve calcifications, lost to folow up with AF RVR and HF symptoms  Assessment & Plan    HFpEF Mod-Severe Mitral regurgitation Afib  wit hRVR   Morbid Obesity Fatty liver   OSA on CPAP AKI   Dyspnea    Pt admitted on 1/12 with SOB  had been out of meds    Progressive worsening of breathing   Wt up 10 lb In ED was hypotensive and in afib wit hRVR   stated on amiodarone     Here in hsop he has been treated with po amiodarone    Give prn lasix      Today plan on R and L heart catheterization   Note Cr 1.7   Will give IVF this am      Minimal contrast   no LV gram   Afib   This hs been a chronic problem   Now fast on admit  On PO amiodarone 400 daily as well as diltiazem 360 and metoprolol 100 bid    Previous discussoin on TEE / Cardioversion    Not sure if he would maintatin  SR   Will assess after cath Keep on heparin   Mitral valve diz    MR is at least moderately severe   Would benefit from reevaluation  wit hTEE   At some point will need repair  OSA   Pt on CPAP  Metabolic syndrom   Pt does not have dx of DM but has metabolic syndrome   Liver with fatty changes   Discussed diet: Skips breakfast   Lunch   Chicken sandwich   Dinner   Chickne and veggies   Does eat some bread Drinks 2 cans ginger ale per day   Discussed diet   He neds to stop sugars   Minimalize carbs   Would do better on keto type diet  For questions or updates, please contact [facility] Please consult [email or web address] for contact info under Cardiology/STEMI.      Signed, Dorris Carnes, MD  12/05/2021, 7:39 AM

## 2021-12-05 NOTE — TOC Transition Note (Deleted)
Transition of Care [facility]) - CM/SW Discharge Note   Patient Details  Name: Anthony Byrd MRN: [id number] Date of Birth: Oct 25, 1971  [facility] CM/SW Contact:  Tom-Johnson, Renea Ee, RN Phone Number: 12/05/2021, 12:52 PM   Clinical Narrative:    CM consulted for Medication assistance. Spoke with patient at bedside. Patient states he is from home with his roommate. Does not have any children. Patient is self employed. Independent with care prior to hospitalization. Does not have DME's at home. Patient states he uses [facility] and able to pay for his medications. CM called and spoke with Leavy Cella Pharmacist. Nicole Kindred states [facility] with "[facility]" as patient is active with Internal Medicine services. MD notified to address at discharge. CM will continue to follow with needs.      Barriers to Discharge: Continued Medical Work up   Patient Goals and CMS Choice Patient states their goals for this hospitalization and ongoing recovery are:: To return home CMS Medicare.gov Compare Post Acute Care list provided to:: Patient Choice offered to / list presented to : NA  Discharge Placement                       Discharge Plan and Services   Discharge Planning Services: CM Consult                                 Social Determinants of Health (SDOH) Interventions     Readmission Risk Interventions No flowsheet data found.

## 2021-12-05 NOTE — Plan of Care (Signed)
°  Problem: Education: Goal: Knowledge of disease or condition will improve Outcome: Progressing   Problem: Activity: Goal: Ability to tolerate increased activity will improve Outcome: Progressing   Problem: Cardiac: Goal: Ability to achieve and maintain adequate cardiopulmonary perfusion will improve Outcome: Progressing

## 2021-12-05 NOTE — Progress Notes (Signed)
HD#2 Subjective:  Overnight Events: no event  Patient seen at bedside this AM. He had questions in his heart cath, and these questions were answered.  He feels like the shortness of breath has gotten somewhat better, but states that it typically comes and goes.  He states that the abdominal pain he was having yesterday has now subsided.  No other complaints.  Objective:  Vital signs in last 24 hours: Vitals:   12/04/21 1635 12/04/21 2148 12/05/21 0726 12/05/21 0922  BP: 90/72 122/82  109/84  Pulse: 89 97  87  Resp: 17 20  18   Temp: 98.4 F (36.9 C)   98.1 F (36.7 C)  TempSrc: Oral   Oral  SpO2: 93% 97%  94%  Weight:   131.8 kg   Height:       Supplemental O2: Nasal Cannula SpO2: 94 % O2 Flow Rate (L/min): 3 L/min   Physical Exam:  Physical Exam Constitutional:      General: He is not in acute distress.    Appearance: He is not ill-appearing.  HENT:     Head: Normocephalic.  Eyes:     General:        Right eye: No discharge.        Left eye: No discharge.     Conjunctiva/sclera: Conjunctivae normal.  Cardiovascular:     Rate and Rhythm: Normal rate. Rhythm irregular.     Comments: +1 bilateral lower extremity edema.  No JVD Pulmonary:     Effort: Pulmonary effort is normal.     Breath sounds: Normal breath sounds.     Comments: No crackles or wheezing Abdominal:     General: Bowel sounds are normal.     Palpations: Abdomen is soft.     Tenderness: There is no abdominal tenderness. There is no guarding or rebound.  Skin:    General: Skin is warm.  Neurological:     General: No focal deficit present.     Mental Status: He is alert.  Psychiatric:        Mood and Affect: Mood normal.        Behavior: Behavior normal.    Filed Weights   12/03/21 0645 12/04/21 0500 12/05/21 0726  Weight: 132.2 kg 132.3 kg 131.8 kg    Intake/Output Summary (Last 24 hours) at 12/05/2021 0930 Last data filed at 12/05/2021 6283 Gross per 24 hour  Intake 220 ml  Output  1900 ml  Net -1680 ml   Net IO Since Admission: -1,833.51 mL [12/05/21 0930]  Pertinent Labs: CBC Latest Ref Rng & Units 12/05/2021 12/04/2021 12/02/2021  WBC 4.0 - 10.5 K/uL 14.0(H) 11.9(H) 10.4  Hemoglobin 13.0 - 17.0 g/dL 15.8 16.1 15.6  Hematocrit 39.0 - 52.0 % 48.7 49.0 47.4  Platelets 150 - 400 K/uL 252 215 217    CMP Latest Ref Rng & Units 12/05/2021 12/04/2021 12/03/2021  Glucose 70 - 99 mg/dL 133(H) 98 135(H)  BUN 6 - 20 mg/dL 49(H) 42(H) 46(H)  Creatinine 0.61 - 1.24 mg/dL 1.74(H) 1.49(H) 1.89(H)  Sodium 135 - 145 mmol/L 131(L) 133(L) 130(L)  Potassium 3.5 - 5.1 mmol/L 4.2 4.8 3.7  Chloride 98 - 111 mmol/L 98 94(L) 95(L)  CO2 22 - 32 mmol/L 21(L) 27 23  Calcium 8.9 - 10.3 mg/dL 8.2(L) 8.4(L) 8.4(L)  Total Protein 6.5 - 8.1 g/dL - - -  Total Bilirubin 0.3 - 1.2 mg/dL - - -  Alkaline Phos 38 - 126 U/L - - -  AST 15 -  41 U/L - - -  ALT 0 - 44 U/L - - -    Imaging: US Abdomen Limited RUQ (LIVER/GB)  Result Date: 12/04/2021 CLINICAL DATA:  Abdominal pain EXAM: ULTRASOUND ABDOMEN LIMITED RIGHT UPPER QUADRANT COMPARISON:  07/04/2020 FINDINGS: Gallbladder: Partially contracted. No pericholecystic fluid is noted. Negative sonographic Murphy's sign is elicited. Common bile duct: Diameter: 5 mm. Liver: Increased in echogenicity without focal mass. Portal vein is patent on color Doppler imaging with normal direction of blood flow towards the liver. Other: None. IMPRESSION: Fatty infiltration of the liver. No acute abnormality is noted. Electronically Signed   By: Inez Catalina M.D.   On: 12/04/2021 19:27    Assessment/Plan:   Principal Problem:   AKI (acute kidney injury) ([place]) Active Problems:   Atrial fibrillation ([place])   DOE (dyspnea on exertion)   Atrial fibrillation with RVR ([place])  Patient Summary: Anthony Byrd is a 51 y.o. with a pertinent PMH of persistent atrial fibrillation s/p cardioversion in 2021 and not on anticoagulation, HFrEF (EF 55-65 in 2021), moderate-severe  mitral regurgitation, and OSA not on CPAP, who presented with SOB and nausea and admitted for AKI and Afib with RVR.   Persistent valvular Atrial Fibrillation with RVR Severe mitral regurgitation Echo showed EF 50-55% with moderate to severe MR. Afib is likely 2/2 MR causing severe LA dilation. Heart rate has improved over the past few days.  Plan for [facility] and coronary angiography today, 01/16, per cardiology. He is high risk for reenter A. fib given his severe LA dilation 2/2 severe MR. -Appreciate cardiology recommendations  -LHC/RHC today -Continue amiodarone 4 mg, diltiazem 360 mg and Lopressor 100 mg BID -Telemetry -Continue heparin  HFpEF exacerbation Put out 2270 cc of fluid and weight is trending down from 295 to 290 pounds.  Patient is unsure of his dry weight.  Appears mildly volume overloaded on exam with LE edema.  -Will diurese based on his heart cath -Strict I's/O's -Daily weights   Acute Kidney Injury, improving Creatinine of 1.85 on admit, improving with diuresis and most recent Cr at 1.74. - Lasix as above - Encourage PO intake  - Trend BMP - Avoid nephrotoxic agents    Best Practice: Diet: Cardiac diet IVF: Fluids: None, Rate: None VTE:  Heparin Code: Full AB: None DISPO: Anticipated discharge pending cardiology interventions and medical stability.  Anthony Brill, MD 12/05/2021, 9:30 AM Pager: [phone] Please contact the on call pager after 5 pm and on weekends at [phone].

## 2021-12-05 NOTE — Interval H&P Note (Signed)
History and Physical Interval Note:  12/05/2021 12:38 PM  Anthony Byrd  has presented today for surgery, with the diagnosis of hf.  The various methods of treatment have been discussed with the patient and family. After consideration of risks, benefits and other options for treatment, the patient has consented to  Procedure(s): RIGHT/LEFT HEART CATH AND CORONARY ANGIOGRAPHY (N/A) as a surgical intervention.  The patient's history has been reviewed, patient examined, no change in status, stable for surgery.  I have reviewed the patient's chart and labs.  Questions were answered to the patient's satisfaction.    Cath Lab Visit (complete for each Cath Lab visit)  Clinical Evaluation Leading to the Procedure:   ACS: No.  Non-ACS:    Anginal Classification: CCS I  Anti-ischemic medical therapy: Maximal Therapy (2 or more classes of medications)  Non-Invasive Test Results: No non-invasive testing performed  Prior CABG: No previous CABG        Lauree Chandler

## 2021-12-05 NOTE — Progress Notes (Signed)
TR BAND REMOVAL  LOCATION:    radial rt radial  DEFLATED PER PROTOCOL:   yes  TIME BAND OFF / DRESSING APPLIED:    1545/gauze and tegaderm  SITE UPON ARRIVAL:    Level 0  SITE AFTER BAND REMOVAL:    Level 0  CIRCULATION SENSATION AND MOVEMENT:    Within Normal Limits :  rt hand and fingers warm and pink; palpable rt radial  COMMENTS:
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
## 2021-12-06 ENCOUNTER — Other Ambulatory Visit (HOSPITAL_COMMUNITY): Payer: Self-pay

## 2021-12-06 ENCOUNTER — Encounter (HOSPITAL_COMMUNITY): Payer: Self-pay | Admitting: Cardiovascular Disease

## 2021-12-06 DIAGNOSIS — I4819 Other persistent atrial fibrillation: Secondary | ICD-10-CM

## 2021-12-06 DIAGNOSIS — I340 Nonrheumatic mitral (valve) insufficiency: Secondary | ICD-10-CM

## 2021-12-06 DIAGNOSIS — I5033 Acute on chronic diastolic (congestive) heart failure: Secondary | ICD-10-CM

## 2021-12-06 LAB — CBC
HCT: 46 % (ref 39.0–52.0)
Hemoglobin: 14.9 g/dL (ref 13.0–17.0)
MCH: 28.3 pg (ref 26.0–34.0)
MCHC: 32.4 g/dL (ref 30.0–36.0)
MCV: 87.5 fL (ref 80.0–100.0)
Platelets: 229 10*3/uL (ref 150–400)
RBC: 5.26 MIL/uL (ref 4.22–5.81)
RDW: 15.4 % (ref 11.5–15.5)
WBC: 10.9 10*3/uL — ABNORMAL HIGH (ref 4.0–10.5)
nRBC: 0 % (ref 0.0–0.2)

## 2021-12-06 LAB — BASIC METABOLIC PANEL
Anion gap: 10 (ref 5–15)
Anion gap: 10 (ref 5–15)
BUN: 31 mg/dL — ABNORMAL HIGH (ref 6–20)
BUN: 32 mg/dL — ABNORMAL HIGH (ref 6–20)
CO2: 27 mmol/L (ref 22–32)
CO2: 26 mmol/L (ref 22–32)
Calcium: 8.4 mg/dL — ABNORMAL LOW (ref 8.9–10.3)
Calcium: 8.5 mg/dL — ABNORMAL LOW (ref 8.9–10.3)
Chloride: 98 mmol/L (ref 98–111)
Chloride: 96 mmol/L — ABNORMAL LOW (ref 98–111)
Creatinine, Ser: 1.17 mg/dL (ref 0.61–1.24)
Creatinine, Ser: 1.33 mg/dL — ABNORMAL HIGH (ref 0.61–1.24)
GFR, Estimated: >60 mL/min (ref >60)
GFR, Estimated: >60 mL/min (ref >60)
Glucose, Bld: 94 mg/dL (ref 70–99)
Glucose, Bld: 107 mg/dL — ABNORMAL HIGH (ref 70–99)
Potassium: 3.5 mmol/L (ref 3.5–5.1)
Potassium: 4 mmol/L (ref 3.5–5.1)
Sodium: 135 mmol/L (ref 135–145)
Sodium: 132 mmol/L — ABNORMAL LOW (ref 135–145)

## 2021-12-06 LAB — APTT
aPTT: 49 seconds — ABNORMAL HIGH (ref 24–36)
aPTT: 58 seconds — ABNORMAL HIGH (ref 24–36)

## 2021-12-06 LAB — HEPARIN LEVEL (UNFRACTIONATED): Heparin Unfractionated: 0.53 IU/mL (ref 0.30–0.70)

## 2021-12-06 MED ORDER — AMIODARONE HCL 200 MG PO TABS
400.0000 mg | ORAL_TABLET | Freq: Two times a day (BID) | ORAL | Status: DC
  Administered 2021-12-06: 400 mg via ORAL
  Filled 2021-12-06 (×2): qty 2

## 2021-12-06 MED ORDER — HEPARIN (PORCINE) 25000 UT/250ML-% IV SOLN
2300.0000 [IU]/h | INTRAVENOUS | Status: DC
  Administered 2021-12-06 (×2): 2100 [IU]/h via INTRAVENOUS
  Administered 2021-12-07 – 2021-12-09 (×6): 2300 [IU]/h via INTRAVENOUS
  Filled 2021-12-06 (×8): qty 250

## 2021-12-06 MED ORDER — GUAIFENESIN-DM 100-10 MG/5ML PO SYRP
5.0000 mL | ORAL_SOLUTION | ORAL | Status: DC | PRN
  Administered 2021-12-06: 5 mL via ORAL
  Filled 2021-12-06: qty 5

## 2021-12-06 MED ORDER — HEPARIN BOLUS VIA INFUSION
3000.0000 [IU] | Freq: Once | INTRAVENOUS | Status: AC
  Administered 2021-12-06: 3000 [IU] via INTRAVENOUS
  Filled 2021-12-06: qty 3000

## 2021-12-06 MED ORDER — FUROSEMIDE 10 MG/ML IJ SOLN
80.0000 mg | Freq: Two times a day (BID) | INTRAMUSCULAR | Status: DC
  Administered 2021-12-06 (×2): 80 mg via INTRAVENOUS
  Filled 2021-12-06 (×2): qty 8

## 2021-12-06 MED ORDER — POTASSIUM CHLORIDE 20 MEQ PO PACK
20.0000 meq | PACK | Freq: Every day | ORAL | Status: DC
Start: 2021-12-06 — End: 2021-12-06

## 2021-12-06 MED ORDER — BENZONATATE 100 MG PO CAPS
200.0000 mg | ORAL_CAPSULE | Freq: Three times a day (TID) | ORAL | Status: DC | PRN
  Administered 2021-12-06 – 2021-12-07 (×2): 200 mg via ORAL
  Filled 2021-12-06 (×2): qty 2

## 2021-12-06 MED ORDER — SALINE SPRAY 0.65 % NA SOLN
1.0000 | NASAL | Status: DC | PRN
  Administered 2021-12-06: 1 via NASAL
  Filled 2021-12-06: qty 44

## 2021-12-06 NOTE — Plan of Care (Signed)
  Problem: Health Behavior/Discharge Planning: Goal: Ability to manage health-related needs will improve Outcome: Progressing   Problem: Clinical Measurements: Goal: Ability to maintain clinical measurements within normal limits will improve Outcome: Progressing   

## 2021-12-06 NOTE — Progress Notes (Signed)
HD#3 Subjective:  Overnight Events: no event  Patient seen at bedside this AM. He reports that his breathing feels better this morning.  Overall, states that he has felt a lot better since his cath yesterday.  His legs still feel swollen.  No other complaints.  Objective:  Vital signs in last 24 hours: Vitals:   12/05/21 1515 12/05/21 1530 12/05/21 1618 12/05/21 2148  BP: 111/63 110/77 113/71 (!) 124/111  Pulse: 99 (!) 114 (!) 106 76  Resp: (!) 21 (!) 24 19 18   Temp:   98.1 F (36.7 C) 98.2 F (36.8 C)  TempSrc:   Oral Oral  SpO2: 91% 92% 94% 92%  Weight:      Height:       Supplemental O2: Nasal Cannula SpO2: 92 % O2 Flow Rate (L/min): 2 L/min  Physical Exam:  Physical Exam Constitutional:      General: He is not in acute distress.    Appearance: He is not ill-appearing.  HENT:     Head: Normocephalic.  Eyes:     General:        Right eye: No discharge.        Left eye: No discharge.     Conjunctiva/sclera: Conjunctivae normal.  Cardiovascular:     Rate and Rhythm: Normal rate. Rhythm irregular.     Comments: +1 bilateral lower extremity edema.  No JVD Pulmonary:     Effort: Pulmonary effort is normal.     Breath sounds: Normal breath sounds.     Comments: No crackles or wheezing Abdominal:     General: Bowel sounds are normal.     Palpations: Abdomen is soft.     Tenderness: There is no abdominal tenderness. There is no guarding or rebound.  Skin:    General: Skin is warm.  Neurological:     General: No focal deficit present.     Mental Status: He is alert.  Psychiatric:        Mood and Affect: Mood normal.        Behavior: Behavior normal.    Filed Weights   12/03/21 0645 12/04/21 0500 12/05/21 0726  Weight: 132.2 kg 132.3 kg 131.8 kg    Intake/Output Summary (Last 24 hours) at 12/06/2021 0623 Last data filed at 12/06/2021 0600 Gross per 24 hour  Intake 991.61 ml  Output 1300 ml  Net -308.39 ml    Net IO Since Admission: -2,141.9 mL  [12/06/21 0623]  Pertinent Labs: CBC Latest Ref Rng & Units 12/06/2021 12/05/2021 12/05/2021  WBC 4.0 - 10.5 K/uL 10.9(H) - -  Hemoglobin 13.0 - 17.0 g/dL 14.9 16.0 15.0  Hematocrit 39.0 - 52.0 % 46.0 47.0 44.0  Platelets 150 - 400 K/uL 229 - -    CMP Latest Ref Rng & Units 12/06/2021 12/05/2021 12/05/2021  Glucose 70 - 99 mg/dL 94 - -  BUN 6 - 20 mg/dL 31(H) - -  Creatinine 0.61 - 1.24 mg/dL 1.17 - -  Sodium 135 - 145 mmol/L 135 136 138  Potassium 3.5 - 5.1 mmol/L 3.5 3.4(L) 3.1(L)  Chloride 98 - 111 mmol/L 98 - -  CO2 22 - 32 mmol/L 27 - -  Calcium 8.9 - 10.3 mg/dL 8.4(L) - -  Total Protein 6.5 - 8.1 g/dL - - -  Total Bilirubin 0.3 - 1.2 mg/dL - - -  Alkaline Phos 38 - 126 U/L - - -  AST 15 - 41 U/L - - -  ALT 0 - 44 U/L - - -  Imaging: CARDIAC CATHETERIZATION  Result Date: 12/05/2021   Hemodynamic findings consistent with severe pulmonary hypertension. No angiographic evidence of CAD Elevated right and left heart pressures. RA 17, RV 52/12/18, PA 61/29 mean 47, PCWP 33, LV 116/15/24, AO 111/85) Continue workup for mitral regurgitation and possible valve surgery. TEE planned later this week to assess valve and for DCCV. Continue diuresis as renal function allows.    Assessment/Plan:   Principal Problem:   AKI (acute kidney injury) ([facility]) Active Problems:   Atrial fibrillation ([place])   DOE (dyspnea on exertion)   Atrial fibrillation with RVR (HCC)   Acute diastolic CHF (congestive heart failure), NYHA class 4 (HCC)   Severe mitral regurgitation  Patient Summary: Anthony Byrd is a 51 y.o. with a pertinent PMH of persistent atrial fibrillation s/p cardioversion in 2021 and not on anticoagulation, HFrEF (EF 55-65 in 2021), moderate-severe mitral regurgitation, and OSA not on CPAP, who presented with SOB and nausea and admitted for AKI and Afib with RVR.    Persistent valvular Atrial Fibrillation with RVR Severe mitral regurgitation Echo showed EF 50-55% with moderate to  severe MR. Afib is likely 2/2 MR causing severe LA dilation. Left and right heart cath showed no angiographic evidence of CAD, but showed elevated right and left heart pressures; RA 17, RV 52/12/18, PA 61/29 mean 47, PCWP 33, LV 116/15/24, AO 111/85).  Rates in 90s-100s, remains in Afib. We are working with cardiology to optimize patient's volume status, will give 80 mg IV Lasix BID today. Otherwise, cardiology will perform TEE hopefully tomorrow to assess the degree of MR, followed by possible cardioversion. -Appreciate cardiology recommendations  -Cardiology is planning for TEE tomorrow, cardioversion based on TEE -IV Lasix 80 mg BID -Continue amiodarone 4 mg, diltiazem 360 mg and Lopressor 100 mg BID -Telemetry -Continue heparin  HFpEF exacerbation  Put out 1.3 L of fluid and weight is trending down, now at 288 pounds.  Patient is unsure of his dry weight.  Appears mildly volume overloaded on exam with LE edema.  -Diurese as above -Strict I's/O's -Daily weights   Acute Kidney Injury, improving Creatinine of 1.85 on admit, improving with diuresis and most recent Cr at 1.1. - Lasix as above - Encourage PO intake  - Trend BMP - Avoid nephrotoxic agents    Best Practice: Diet: Cardiac diet IVF: Fluids: None, Rate: None VTE:  Heparin Code: Full AB: None DISPO: Anticipated discharge pending cardiology interventions and medical stability.  Orvis Brill, MD 12/06/2021, 6:23 AM Pager: [phone] Please contact the on call pager after 5 pm and on weekends at [phone].

## 2021-12-06 NOTE — Progress Notes (Signed)
Progress Note  Patient Name: Anthony Byrd Date of Encounter: 12/06/2021  Primary Cardiologist: [name], MD   Subjective   Pt denies CP   Breathing is still short   Inpatient Medications    Scheduled Meds:  amiodarone  400 mg Oral Daily   diltiazem  360 mg Oral Daily   metoprolol tartrate  100 mg Oral BID   multivitamin with minerals  1 tablet Oral Daily   potassium chloride SA  20 mEq Oral Daily   sodium chloride flush  3 mL Intravenous Q12H   sodium chloride flush  3 mL Intravenous Q12H   Continuous Infusions:  sodium chloride Stopped (12/05/21 1613)   heparin 1,800 Units/hr (12/06/21 0109)   PRN Meds: sodium chloride, acetaminophen **OR** acetaminophen, polyethylene glycol, sodium chloride flush   Vital Signs    Vitals:   12/05/21 1530 12/05/21 1618 12/05/21 2148 12/06/21 0624  BP: 110/77 113/71 (!) 124/111 (!) 149/131  Pulse: (!) 114 (!) 106 76 92  Resp: (!) 24 19 18 18   Temp:  98.1 F (36.7 C) 98.2 F (36.8 C) (!) 97.5 F (36.4 C)  TempSrc:  Oral Oral Oral  SpO2: 92% 94% 92% 91%  Weight:      Height:        Intake/Output Summary (Last 24 hours) at 12/06/2021 0800 Last data filed at 12/06/2021 0700 Gross per 24 hour  Intake 1211.61 ml  Output 1800 ml  Net -588.39 ml   Filed Weights   12/03/21 0645 12/04/21 0500 12/05/21 0726  Weight: 132.2 kg 132.3 kg 131.8 kg    Telemetry    AF Rates  90s to 100s  - Personally Reviewed  ECG    No new      Physical Exam   Gen: No acute distress    Sitting in chair   Neck: Diffiuclt to assess JVP given size / full is neck  Cardiac   Irreg irreg  No S3   Distant  heart sounds   Respiratory: Relatively CTA  GI: Obese   Non tender   MS: Triv to 1+   LE edema   Integument: Skin feels warm  Chronic skin changes legs   Neuro:  A and O x 3     Labs    Chemistry Recent Labs  Lab 12/01/21 1023 12/02/21 0437 12/04/21 0718 12/05/21 0444 12/05/21 1312 12/05/21 1318 12/06/21 0311  NA 136   < >  133* 131* 138 136 135  K 3.7   < > 4.8 4.2 3.1* 3.4* 3.5  CL 99   < > 94* 98  --   --  98  CO2 24   < > 27 21*  --   --  27  GLUCOSE 138*   < > 98 133*  --   --  94  BUN 31*   < > 42* 49*  --   --  31*  CREATININE 1.85*   < > 1.49* 1.74*  --   --  1.17  CALCIUM 9.2   < > 8.4* 8.2*  --   --  8.4*  PROT 6.7  --   --   --   --   --   --   ALBUMIN 3.2*  --   --   --   --   --   --   AST 44*  --   --   --   --   --   --   ALT 41  --   --   --   --   --   --  ALKPHOS 61  --   --   --   --   --   --   BILITOT 1.4*  --   --   --   --   --   --   GFRNONAA 43*   < > 56* 47*  --   --  >60  ANIONGAP 13   < > 12 12  --   --  10   < > = values in this interval not displayed.     Hematology Recent Labs  Lab 12/04/21 0718 12/05/21 0444 12/05/21 1312 12/05/21 1318 12/06/21 0311  WBC 11.9* 14.0*  --   --  10.9*  RBC 5.64 5.57  --   --  5.26  HGB 16.1 15.8 15.0 16.0 14.9  HCT 49.0 48.7 44.0 47.0 46.0  MCV 86.9 87.4  --   --  87.5  MCH 28.5 28.4  --   --  28.3  MCHC 32.9 32.4  --   --  32.4  RDW 15.8* 15.4  --   --  15.4  PLT 215 252  --   --  229    Cardiac EnzymesNo results for input(s): TROPONINI in the last 168 hours. No results for input(s): TROPIPOC in the last 168 hours.   BNP Recent Labs  Lab 11/29/21 1232 12/01/21 1023  BNP 55.1 102.6*     DDimer No results for input(s): DDIMER in the last 168 hours.   Radiology    CARDIAC CATHETERIZATION  Result Date: 12/05/2021   Hemodynamic findings consistent with severe pulmonary hypertension. No angiographic evidence of CAD Elevated right and left heart pressures. RA 17, RV 52/12/18, PA 61/29 mean 47, PCWP 33, LV 116/15/24, AO 111/85) Continue workup for mitral regurgitation and possible valve surgery. TEE planned later this week to assess valve and for DCCV. Continue diuresis as renal function allows.   US Abdomen Limited RUQ (LIVER/GB)  Result Date: 12/04/2021 CLINICAL DATA:  Abdominal pain EXAM: ULTRASOUND ABDOMEN LIMITED  RIGHT UPPER QUADRANT COMPARISON:  07/04/2020 FINDINGS: Gallbladder: Partially contracted. No pericholecystic fluid is noted. Negative sonographic Murphy's sign is elicited. Common bile duct: Diameter: 5 mm. Liver: Increased in echogenicity without focal mass. Portal vein is patent on color Doppler imaging with normal direction of blood flow towards the liver. Other: None. IMPRESSION: Fatty infiltration of the liver. No acute abnormality is noted. Electronically Signed   By: Inez Catalina M.D.   On: 12/04/2021 19:27     Cardiac studies     Cardiac Cath  12/05/21\    Hemodynamic findings consistent with severe pulmonary hypertension.   No angiographic evidence of CAD Elevated right and left heart pressures. RA 17, RV 52/12/18, PA 61/29 mean 47, PCWP 33, LV 116/15/24, AO 111/85)   Continue workup for mitral regurgitation and possible valve surgery. TEE planned later this week to assess valve and for DCCV. Continue diuresis as renal function allows.   Echo 12/02/21 Left ventricular ejection fraction, by estimation, is 50 to 55%. The left ventricle has low normal function. The left ventricle has no regional wall motion abnormalities. There is moderate concentric left ventricular hypertrophy. Diastolic function indeterminant due to Afib. 1. Right ventricular systolic function is mildly reduced. The right ventricular size is mildly enlarged. Tricuspid regurgitation signal is inadequate for assessing PA pressure. 2. 3. Left atrial size was severely dilated. 4. Right atrial size was mildly dilated. The mitral valve is moderately thickened and calcified with moderate-to-severe mitral annular calcification. Prior TTE in 2021 with A2/P2 prolapse  which is difficult to appreciate on current study due to degree of leaflet calcification. There is central mitral regurgitation that is appears moderate vs moderate-to-severe, although, is underappreciated on color doppler 5. The aortic valve was not well  visualized. There is moderate calcification of the aortic valve. There is moderate thickening of the aortic valve. Aortic valve regurgitation is trivial. Mild aortic valve stenosis. 6. The inferior vena cava is dilated in size with <50% respiratory variability, suggesting right atrial pressure of 15 mmHg.  Patient Profile     51 y.o. male CKD but with notable mitral and aortic valve calcifications, lost to folow up with AF RVR and HF symptoms  Assessment & Plan    HFpEF Pt admitted on 12/01/20 with SOB  He had been out of meds    Had progressive worsening of breathing   Wt up 10 lb In ED was hypotensive and in afib wit hRVR   Started on po amiodarone and heparin  Underwent R and L heart cath yesterday  (see above)  No signficant CAD     R sided pressures elevated   LVEDP elevated      Plan   Diurese with IV lasix   today   This AM give 80 mg and   Follow response  BMET later today   Atrial fibrillation   Pt with persistent atrial fibrillation in past  Last cardioversion was in September 2021  He has not been seen in cardiology since     This admit he presented in afib with RVR Not on Xarelto (couldn't afford) Currently on IV heparin, diltiazem 360, metoprolol  100 bid   Amiodarone 400 daily started this admit     He remains in afib   Rates 90s to 100s  Plan for DCCV if no clot seen on TEE   Will increase amio to 400 bid for now     Mitral regurgitation.   The pt has had echoes in the past that show severe annular calcification  On TTE MV diffiuclt to see   MR felt to be at least moderate    TEE in august 2021 2021 was limited as patient developed respiratory distress.  Plan to diurese today   Reassess in AM  Tentative plan for TEE tomorrow afternoon to evaluate valve more closely for mechanism of MR Most likely needs repair    OSA   Pt on CPAP  Metabolic syndrome   Pt does not have dx of DM but has metabolic syndrome   Has fatty liver   Discussed diet yesterday: Skips  breakfast   Lunch   Chicken sandwich   Dinner   Chickne and veggies   Does eat some bread Drinks 2 cans ginger ale per day   Discussed diet   He neds to stop sugars   Minimalize carbs   Would do better on keto type diet    For questions or updates, please contact [facility] Please consult [email or web address] for contact info under Cardiology/STEMI.      Signed, Dorris Carnes, MD  12/06/2021, 8:00 AM
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
## 2021-12-06 NOTE — Progress Notes (Signed)
Patient declined CPAP use at this time and states he doesn't want to use during his hospital stay. Equipment removed from patients room. Patient aware he can call for CPAP if needed.

## 2021-12-06 NOTE — Progress Notes (Signed)
Patient refused CPAP use 

## 2021-12-07 ENCOUNTER — Inpatient Hospital Stay (HOSPITAL_COMMUNITY): Payer: Self-pay

## 2021-12-07 ENCOUNTER — Inpatient Hospital Stay (HOSPITAL_COMMUNITY): Payer: Self-pay | Admitting: Certified Registered"

## 2021-12-07 ENCOUNTER — Encounter (HOSPITAL_COMMUNITY): Payer: Self-pay | Admitting: Student in an Organized Health Care Education/Training Program

## 2021-12-07 ENCOUNTER — Encounter (HOSPITAL_COMMUNITY)
Admission: EM | Disposition: A | Discharge status: Other Acute Inpatient Hospital | Payer: Self-pay | Source: Home / Self Care | Attending: Internal Medicine

## 2021-12-07 DIAGNOSIS — I340 Nonrheumatic mitral (valve) insufficiency: Secondary | ICD-10-CM

## 2021-12-07 DIAGNOSIS — I350 Nonrheumatic aortic (valve) stenosis: Secondary | ICD-10-CM

## 2021-12-07 HISTORY — PX: CARDIOVERSION: SHX1299

## 2021-12-07 HISTORY — PX: TEE WITHOUT CARDIOVERSION: SHX5443

## 2021-12-07 LAB — CBC
HCT: 49.4 % (ref 39.0–52.0)
Hemoglobin: 16.3 g/dL (ref 13.0–17.0)
MCH: 28.7 pg (ref 26.0–34.0)
MCHC: 33 g/dL (ref 30.0–36.0)
MCV: 87 fL (ref 80.0–100.0)
Platelets: 216 10*3/uL (ref 150–400)
RBC: 5.68 MIL/uL (ref 4.22–5.81)
RDW: 15.6 % — ABNORMAL HIGH (ref 11.5–15.5)
WBC: 10.6 10*3/uL — ABNORMAL HIGH (ref 4.0–10.5)
nRBC: 0 % (ref 0.0–0.2)

## 2021-12-07 LAB — BASIC METABOLIC PANEL
Anion gap: 12 (ref 5–15)
BUN: 39 mg/dL — ABNORMAL HIGH (ref 6–20)
CO2: 23 mmol/L (ref 22–32)
Calcium: 8.2 mg/dL — ABNORMAL LOW (ref 8.9–10.3)
Chloride: 98 mmol/L (ref 98–111)
Creatinine, Ser: 1.63 mg/dL — ABNORMAL HIGH (ref 0.61–1.24)
GFR, Estimated: 50 mL/min — ABNORMAL LOW (ref >60)
Glucose, Bld: 129 mg/dL — ABNORMAL HIGH (ref 70–99)
Potassium: 4.5 mmol/L (ref 3.5–5.1)
Sodium: 133 mmol/L — ABNORMAL LOW (ref 135–145)

## 2021-12-07 LAB — APTT
aPTT: 68 seconds — ABNORMAL HIGH (ref 24–36)
aPTT: 97 seconds — ABNORMAL HIGH (ref 24–36)

## 2021-12-07 LAB — HEPARIN LEVEL (UNFRACTIONATED)
Heparin Unfractionated: 0.53 IU/mL (ref 0.30–0.70)
Heparin Unfractionated: 0.81 IU/mL — ABNORMAL HIGH (ref 0.30–0.70)

## 2021-12-07 SURGERY — ECHOCARDIOGRAM, TRANSESOPHAGEAL
Anesthesia: General

## 2021-12-07 MED ORDER — PERFLUTREN LIPID MICROSPHERE
INTRAVENOUS | Status: DC | PRN
  Administered 2021-12-07: 4 mL via INTRAVENOUS

## 2021-12-07 MED ORDER — PROPOFOL 500 MG/50ML IV EMUL
INTRAVENOUS | Status: DC | PRN
  Administered 2021-12-07: 150 ug/kg/min via INTRAVENOUS

## 2021-12-07 MED ORDER — ONDANSETRON HCL 4 MG/2ML IJ SOLN
4.0000 mg | Freq: Once | INTRAMUSCULAR | Status: AC
  Administered 2021-12-07: 4 mg via INTRAVENOUS
  Filled 2021-12-07: qty 2

## 2021-12-07 MED ORDER — SODIUM CHLORIDE 0.9 % IV SOLN: INTRAVENOUS | Status: DC

## 2021-12-07 MED ORDER — GUAIFENESIN-DM 100-10 MG/5ML PO SYRP
5.0000 mL | ORAL_SOLUTION | ORAL | Status: DC | PRN
  Administered 2021-12-07 (×2): 5 mL via ORAL
  Filled 2021-12-07 (×2): qty 5

## 2021-12-07 MED ORDER — SALINE SPRAY 0.65 % NA SOLN: 1.0000 | NASAL | Status: DC | PRN

## 2021-12-07 MED ORDER — HEPARIN (PORCINE) 25000 UT/250ML-% IV SOLN
INTRAVENOUS | Status: AC
  Filled 2021-12-07: qty 250

## 2021-12-07 MED ORDER — PHENYLEPHRINE 40 MCG/ML (10ML) SYRINGE FOR IV PUSH (FOR BLOOD PRESSURE SUPPORT)
PREFILLED_SYRINGE | INTRAVENOUS | Status: DC | PRN
  Administered 2021-12-07: 40 ug via INTRAVENOUS
  Administered 2021-12-07: 80 ug via INTRAVENOUS
  Administered 2021-12-07: 120 ug via INTRAVENOUS
  Administered 2021-12-07: 160 ug via INTRAVENOUS
  Administered 2021-12-07: 80 ug via INTRAVENOUS

## 2021-12-07 MED ORDER — AMIODARONE HCL IN DEXTROSE 360-4.14 MG/200ML-% IV SOLN
60.0000 mg/h | INTRAVENOUS | Status: AC
  Administered 2021-12-07: 60 mg/h via INTRAVENOUS
  Filled 2021-12-07: qty 200

## 2021-12-07 MED ORDER — EPHEDRINE SULFATE-NACL 50-0.9 MG/10ML-% IV SOSY
PREFILLED_SYRINGE | INTRAVENOUS | Status: DC | PRN
  Administered 2021-12-07 (×2): 5 mg via INTRAVENOUS

## 2021-12-07 MED ORDER — PROPOFOL 10 MG/ML IV BOLUS
INTRAVENOUS | Status: DC | PRN
  Administered 2021-12-07 (×3): 20 mg via INTRAVENOUS

## 2021-12-07 MED ORDER — SODIUM CHLORIDE 0.9 % IV SOLN: INTRAVENOUS | Status: DC | PRN

## 2021-12-07 MED ORDER — AMIODARONE HCL IN DEXTROSE 360-4.14 MG/200ML-% IV SOLN
60.0000 mg/h | INTRAVENOUS | Status: DC
  Administered 2021-12-08 (×3): 30 mg/h via INTRAVENOUS
  Administered 2021-12-09 (×3): 60 mg/h via INTRAVENOUS
  Filled 2021-12-07 (×6): qty 200

## 2021-12-07 NOTE — CV Procedure (Signed)
° ° °  PROCEDURE NOTE:  Procedure:  Transesophageal echocardiogram Operator:  Fransico Him, MD Indications:  atrial fibrillation Complications: None  During this procedure the patient is administered a total of Propofol 746 mg to achieve and maintain moderate conscious sedation.  The patient's heart rate, blood pressure, and oxygen saturation are monitored continuously during the procedure by anesthesia.   Results: Normal LV size and low normal function Mildly dilated RV with mildly reduced RVF Mildly dilated RA Severely dilated LA.  There is minimal spontaneous echo contrast in the body of the mitral valve.  The LA appendage is large.  There is a nonmobile density in the mouth of the LA appendage.  The LAA completely opacifies with definity contrast with no echolucent area corresponding to the area where the density was noted.  This is consistent with artifact and not thrombus.  Normal TV with trivial TR Normal PV Degenerative MV with moderately thickened and midly calcified leaflets mainly at the leaflet base and moderate mitral annular calcification.  3D images were obtained.  There is possible mild prolapse of the A2 segment.  There is severe mitral regurgitation eccentrically directed towards the posterior wall with flow into the left upper pulmonary vein.  There is intermittent flow reversal in the LUPV consistent with severe MR.  Suspect MR is related to annular dilatation as well as possible A2 prolapse.  Trileaflet AV moderately calcified.  The planimitered AVA is 1.12cm2 consistent with moderate aortic stenosis.  Normal interatrial septum with no evidence of shunt by colorflow dopper  Normal thoracic and ascending aorta.  The patient went on to attempted DCCV.   Electrical Cardioversion Procedure Note Anthony Byrd [id number] [birth year]  Procedure: Electrical Cardioversion Indications:  Atrial Fibrillation  Time Out: Verified patient identification, verified  procedure,medications/allergies/relevent history reviewed, required imaging and test results available.  Performed  Procedure Details  The patient was NPO after midnight. Anesthesia was administered at the beside  by Dr.Turk.  Cardioversion was done with synchronized biphasic defibrillation with AP pads with 200watts (pads placed right shoulder and left lower lateral chest wall).  The patient failed to convert to normal sinus rhythm. Cardioversion was done with synchronized biphasic defibrillation with AP pads with 200watts with pads anterior and posterior.  The patient failed to convert to normal sinus rhythm. Cardioversion was done with synchronized biphasic defibrillation with AP pads with 200watts with pads anterior and posterior and anterior pressure to pads was applied.  The patient failed to convert to normal sinus rhythm  IMPRESSION:  Unsuccessful cardioversion of atrial fibrillation  The patient was transferred back to the room in stable condition   Anthony Byrd 12/07/2021, 9:06 AM    The patient tolerated the procedure well and was transferred back to their room in stable condition.  Signed: Fransico Him, MD [facility]

## 2021-12-07 NOTE — Progress Notes (Signed)
HD#4 Subjective:  Overnight Events: NAE  Patient assessed at bedside this AM. He reports that he had a nose bleed yesterday evening. He does not feel short of breath. He is currently on 4 L [place]. He states that he was coughing overnight and had a cramp that went away. The swelling in his legs is about the same as yesterday.  No other complaints.  Objective:  Vital signs in last 24 hours: Vitals:   12/06/21 0853 12/06/21 1621 12/06/21 2057 12/07/21 0418  BP: 107/75 97/64 121/73 99/79  Pulse: 74 93 (!) 52 85  Resp: 19 16 18 20   Temp:  (!) 97.3 F (36.3 C) 98.4 F (36.9 C) 97.9 F (36.6 C)  TempSrc:  Oral Oral Oral  SpO2: 91% 95% 97% (!) 88%  Weight:      Height:       Supplemental O2: Nasal Cannula SpO2: (!) 88 % O2 Flow Rate (L/min): 5 L/min  Physical Exam:  Physical Exam Constitutional:      General: He is not in acute distress.    Appearance: He is not ill-appearing.  HENT:     Head: Normocephalic.  Eyes:     General:        Right eye: No discharge.        Left eye: No discharge.     Conjunctiva/sclera: Conjunctivae normal.  Cardiovascular:     Rate and Rhythm: Normal rate. Rhythm irregular.     Comments: +1 bilateral lower extremity edema.  No JVD Pulmonary:     Effort: Pulmonary effort is normal.     Breath sounds: Normal breath sounds.     Comments: No crackles or wheezing Abdominal:     General: Bowel sounds are normal.     Palpations: Abdomen is soft.     Tenderness: There is no abdominal tenderness. There is no guarding or rebound.  Skin:    General: Skin is warm.  Neurological:     General: No focal deficit present.     Mental Status: He is alert.  Psychiatric:        Mood and Affect: Mood normal.        Behavior: Behavior normal.    Filed Weights   12/03/21 0645 12/04/21 0500 12/05/21 0726  Weight: 132.2 kg 132.3 kg 131.8 kg    Intake/Output Summary (Last 24 hours) at 12/07/2021 0725 Last data filed at 12/07/2021 0100 Gross per 24 hour   Intake 741.05 ml  Output 3950 ml  Net -3208.95 ml    Net IO Since Admission: -5,630.85 mL [12/07/21 0725]  Pertinent Labs: CBC Latest Ref Rng & Units 12/07/2021 12/06/2021 12/05/2021  WBC 4.0 - 10.5 K/uL 10.6(H) 10.9(H) -  Hemoglobin 13.0 - 17.0 g/dL 16.3 14.9 16.0  Hematocrit 39.0 - 52.0 % 49.4 46.0 47.0  Platelets 150 - 400 K/uL 216 229 -    CMP Latest Ref Rng & Units 12/07/2021 12/06/2021 12/06/2021  Glucose 70 - 99 mg/dL 129(H) 107(H) 94  BUN 6 - 20 mg/dL 39(H) 32(H) 31(H)  Creatinine 0.61 - 1.24 mg/dL 1.63(H) 1.33(H) 1.17  Sodium 135 - 145 mmol/L 133(L) 132(L) 135  Potassium 3.5 - 5.1 mmol/L 4.5 4.0 3.5  Chloride 98 - 111 mmol/L 98 96(L) 98  CO2 22 - 32 mmol/L 23 26 27   Calcium 8.9 - 10.3 mg/dL 8.2(L) 8.5(L) 8.4(L)  Total Protein 6.5 - 8.1 g/dL - - -  Total Bilirubin 0.3 - 1.2 mg/dL - - -  Alkaline Phos 38 - 126  U/L - - -  AST 15 - 41 U/L - - -  ALT 0 - 44 U/L - - -    Imaging: No results found.  Assessment/Plan:   Principal Problem:   AKI (acute kidney injury) ([place]) Active Problems:   Atrial fibrillation ([place])   DOE (dyspnea on exertion)   Atrial fibrillation with RVR (HCC)   Acute diastolic CHF (congestive heart failure), NYHA class 4 (HCC)   Severe mitral regurgitation  Patient Summary: Anthony Byrd is a 51 y.o. with a pertinent PMH of persistent atrial fibrillation s/p cardioversion in 2021 and not on anticoagulation, HFrEF (EF 55-65 in 2021), moderate-severe mitral regurgitation, and OSA not on CPAP, who presented with SOB and nausea and admitted for AKI and Afib with RVR.    Persistent valvular Atrial Fibrillation with RVR Severe mitral regurgitation Echo showed EF 50-55% with moderate to severe MR. Afib is likely 2/2 MR causing severe LA dilation. Left and right heart cath showed no angiographic evidence of CAD, but showed elevated right and left heart pressures; RA 17, RV 52/12/18, PA 61/29 mean 47, PCWP 33, LV 116/15/24, AO 111/85).  Rates in 90s-100s,  remains in Afib. We are working with cardiology to optimize patient's volume status. TEE showed severely dilated LA, degenerative MV with moderately thickened leaflets, severe MR with intermittent flow reversal. Cardioversion was attempted but unfortunately not able to convert back to SR. -Appreciate cardiology recommendations  -Diuresis pending cardiology interventions -Continue amiodarone 400 mg BID, diltiazem 360 mg and Lopressor 100 mg BID -Telemetry -Continue heparin  HFpEF exacerbation  Put out 3.9 L of fluid and weight is trending down, now at 288 pounds.  Patient is unsure of his dry weight.  Appears mildly volume overloaded on exam with LE edema.  -Diurese as above -Strict I's/O's -Daily weights   Acute Kidney Injury, improving Creatinine of 1.85 on admit, improving with diuresis and most recent Cr at 1.6. - Diuresis as above - Encourage PO intake  - Trend BMP - Avoid nephrotoxic agents    Best Practice: Diet: Cardiac diet IVF: Fluids: None, Rate: None VTE:  Heparin Code: Full AB: None DISPO: Anticipated discharge pending cardiology interventions and medical stability.  Anthony Brill, MD 12/07/2021, 7:25 AM Pager: [phone] Please contact the on call pager after 5 pm and on weekends at [phone].

## 2021-12-07 NOTE — Progress Notes (Signed)
*  PRELIMINARY RESULTS* Echocardiogram Echocardiogram Transesophageal has been performed.  Johny Chess 12/07/2021, 12:27 PM

## 2021-12-07 NOTE — Transfer of Care (Signed)
Immediate Anesthesia Transfer of Care Note  Patient: Anthony Byrd  Procedure(s) Performed: TRANSESOPHAGEAL ECHOCARDIOGRAM (TEE) CARDIOVERSION  Patient Location: PACU  Anesthesia Type:General  Level of Consciousness: lethargic and responds to stimulation  Airway & Oxygen Therapy: Patient Spontanous Breathing and Patient connected to face mask oxygen  Post-op Assessment: Report given to RN  Post vital signs: Reviewed and stable  Last Vitals:  Vitals Value Taken Time  BP 114/89 12/07/21 1219  Temp    Pulse 103 12/07/21 1222  Resp 19 12/07/21 1222  SpO2 97 % 12/07/21 1222  Vitals shown include unvalidated device data.  Last Pain:  Vitals:   12/07/21 0926  TempSrc: Temporal  PainSc: 0-No pain         Complications: No notable events documented.

## 2021-12-07 NOTE — Progress Notes (Signed)
Transported patient to [facility] 01 via wheel chair.. Report given prior to transport.

## 2021-12-07 NOTE — TOC Initial Note (Signed)
Transition of Care [facility]) - Initial/Assessment Note    Patient Details  Name: Anthony Byrd MRN: [id number] Date of Birth: 30-May-1971  Transition of Care [facility]) CM/SW Contact:    Tom-Johnson, Renea Ee, RN Phone Number: 12/07/2021, 3:52 PM  Clinical Narrative:                  CM consulted for Medication assistance. Spoke with patient at bedside. Patient states he is from home with his roommate. Does not have any children. Patient is self employed. Independent with care prior to hospitalization. Does not have DME's at home. Patient states he uses [facility] and able to pay for his medications. CM called and spoke with Leavy Cella Pharmacist. Nicole Kindred states prescriptions can be sent to [facility] with "[facility]" as patient is active with Internal Medicine services. MD notified to address at discharge. CM will continue to follow with needs.   Expected Discharge Plan: Home/Self Care Barriers to Discharge: Continued Medical Work up   Patient Goals and CMS Choice Patient states their goals for this hospitalization and ongoing recovery are:: To return home CMS Medicare.gov Compare Post Acute Care list provided to:: Patient Choice offered to / list presented to : NA  Expected Discharge Plan and Services Expected Discharge Plan: Home/Self Care   Discharge Planning Services: CM Consult   Living arrangements for the past 2 months: Single Family Home                                      Prior Living Arrangements/Services Living arrangements for the past 2 months: Single Family Home Lives with:: Roommate Patient language and need for interpreter reviewed:: Yes Do you feel safe going back to the place where you live?: Yes      Need for Family Participation in Patient Care: Yes (Comment) Care giver support system in place?: Yes (comment)   Criminal Activity/Legal Involvement Pertinent to Current Situation/Hospitalization: No - Comment as  needed  Activities of Daily Living Home Assistive Devices/Equipment: Eyeglasses, Grab bars in shower ADL Screening (condition at time of admission) Patient's cognitive ability adequate to safely complete daily activities?: Yes Is the patient deaf or have difficulty hearing?: No Does the patient have difficulty seeing, even when wearing glasses/contacts?: No Does the patient have difficulty concentrating, remembering, or making decisions?: No Patient able to express need for assistance with ADLs?: Yes Does the patient have difficulty dressing or bathing?: No Independently performs ADLs?: Yes (appropriate for developmental age) Does the patient have difficulty walking or climbing stairs?: No Weakness of Legs: None Weakness of Arms/Hands: None  Permission Sought/Granted Permission sought to share information with : Case Manager, Family Supports Permission granted to share information with : Yes, Verbal Permission Granted              Emotional Assessment Appearance:: Appears stated age Attitude/Demeanor/Rapport: Engaged, Gracious Affect (typically observed): Accepting, Appropriate, Calm, Hopeful Orientation: : Oriented to Self, Oriented to Place, Oriented to  Time, Oriented to Situation Alcohol / Substance Use: Not Applicable Psych Involvement: No (comment)  Admission diagnosis:  Dyspnea on exertion [R06.09] AKI (acute kidney injury) ([place]) [N17.9] Atrial fibrillation, unspecified type ([place]) [I48.91] Atrial fibrillation with RVR (HCC) [I48.91] Patient Active Problem List   Diagnosis Date Noted   Nonrheumatic mitral valve regurgitation    Acute diastolic CHF (congestive heart failure), NYHA class 4 (HCC)    Severe mitral regurgitation  Atrial fibrillation with RVR ([place]) 12/03/2021   DOE (dyspnea on exertion) 12/02/2021   Left knee pain 12/02/2021   AKI (acute kidney injury) ([place]) 12/01/2021   Lymphadenopathy of left cervical region 09/30/2021   CHF (congestive heart  failure) ([place]) 01/28/2021   Healthcare maintenance 01/28/2021   Right shoulder pain 01/28/2021   Left ankle pain 01/28/2021   Tremor 07/15/2020   Acute on chronic diastolic (congestive) heart failure (HCC)    Cough 06/28/2020   Polycythemia    Hypokalemia    Hematuria 12/03/2018   OSA (obstructive sleep apnea) 11/25/2018   Moderate mitral valve regurgitation 11/25/2018   Atrial fibrillation ([place]) 11/23/2018   PCP:  Timothy Lasso, MD Pharmacy:   [facility] [id number], [place] [address] Phone: [phone] Fax: [phone]  [place] [address] [place] [address] Phone: [phone] Fax: [place] [facility] [place] [address] [place] [address] Phone: [phone] Fax: [phone]     Social Determinants of Health (SDOH) Interventions    Readmission Risk Interventions No flowsheet data found.

## 2021-12-07 NOTE — Plan of Care (Signed)
°  Problem: Health Behavior/Discharge Planning: Goal: Ability to manage health-related needs will improve Outcome: Progressing   Problem: Clinical Measurements: Goal: Ability to maintain clinical measurements within normal limits will improve Outcome: Progressing   Problem: Education: Goal: Knowledge of disease or condition will improve Outcome: Progressing

## 2021-12-07 NOTE — Interval H&P Note (Signed)
History and Physical Interval Note:  12/07/2021 9:06 AM  Anthony Byrd  has presented today for surgery, with the diagnosis of afib.  The various methods of treatment have been discussed with the patient and family. After consideration of risks, benefits and other options for treatment, the patient has consented to  Procedure(s): TRANSESOPHAGEAL ECHOCARDIOGRAM (TEE) (N/A) CARDIOVERSION (N/A) as a surgical intervention.  The patient's history has been reviewed, patient examined, no change in status, stable for surgery.  I have reviewed the patient's chart and labs.  Questions were answered to the patient's satisfaction.     Fransico Him

## 2021-12-07 NOTE — Progress Notes (Signed)
Progress Note  Patient Name: Anthony Byrd Date of Encounter: 12/07/2021  Primary Cardiologist: [name], MD   Subjective   Pt denies SOB sitting   Says he is coughing less.   Able to lay flatter last night   DId have a nose bleed last night    No CP  Inpatient Medications    Scheduled Meds:  amiodarone  400 mg Oral BID   diltiazem  360 mg Oral Daily   furosemide  80 mg Intravenous BID   metoprolol tartrate  100 mg Oral BID   multivitamin with minerals  1 tablet Oral Daily   potassium chloride SA  20 mEq Oral Daily   sodium chloride flush  3 mL Intravenous Q12H   sodium chloride flush  3 mL Intravenous Q12H   Continuous Infusions:  sodium chloride Stopped (12/05/21 1613)   heparin 2,300 Units/hr (12/07/21 0204)   PRN Meds: sodium chloride, acetaminophen **OR** acetaminophen, benzonatate, guaiFENesin-dextromethorphan, polyethylene glycol, sodium chloride, sodium chloride flush   Vital Signs    Vitals:   12/06/21 0853 12/06/21 1621 12/06/21 2057 12/07/21 0418  BP: 107/75 97/64 121/73 99/79  Pulse: 74 93 (!) 52 85  Resp: 19 16 18 20   Temp:  (!) 97.3 F (36.3 C) 98.4 F (36.9 C) 97.9 F (36.6 C)  TempSrc:  Oral Oral Oral  SpO2: 91% 95% 97% (!) 88%  Weight:      Height:        Intake/Output Summary (Last 24 hours) at 12/07/2021 0801 Last data filed at 12/07/2021 0600 Gross per 24 hour  Intake 1156.26 ml  Output 3950 ml  Net -2793.74 ml   Net net 5.2 L  Filed Weights   12/03/21 0645 12/04/21 0500 12/05/21 0726  Weight: 132.2 kg 132.3 kg 131.8 kg    Telemetry    AFib 80s  - Personally Reviewed  ECG    No new      Physical Exam   Gen:  Morbidly obese 51 yo in no acute distress    Sitting in chair   Neck: Full Cardiac   Irreg irreg  No S3   Distant  heart sounds   Respiratory: Relatively CTA  GI: Obese   Non tender   MS: 1+   LE edema  Integument: Skin feels warm  Chronic skin changes legs   Neuro:  A and O x 3     Labs     Chemistry Recent Labs  Lab 12/01/21 1023 12/02/21 0437 12/06/21 0311 12/06/21 1549 12/07/21 0313  NA 136   < > 135 132* 133*  K 3.7   < > 3.5 4.0 4.5  CL 99   < > 98 96* 98  CO2 24   < > 27 26 23   GLUCOSE 138*   < > 94 107* 129*  BUN 31*   < > 31* 32* 39*  CREATININE 1.85*   < > 1.17 1.33* 1.63*  CALCIUM 9.2   < > 8.4* 8.5* 8.2*  PROT 6.7  --   --   --   --   ALBUMIN 3.2*  --   --   --   --   AST 44*  --   --   --   --   ALT 41  --   --   --   --   ALKPHOS 61  --   --   --   --   BILITOT 1.4*  --   --   --   --  GFRNONAA 43*   < > >60 >60 50*  ANIONGAP 13   < > 10 10 12    < > = values in this interval not displayed.     Hematology Recent Labs  Lab 12/05/21 0444 12/05/21 1312 12/05/21 1318 12/06/21 0311 12/07/21 0313  WBC 14.0*  --   --  10.9* 10.6*  RBC 5.57  --   --  5.26 5.68  HGB 15.8   < > 16.0 14.9 16.3  HCT 48.7   < > 47.0 46.0 49.4  MCV 87.4  --   --  87.5 87.0  MCH 28.4  --   --  28.3 28.7  MCHC 32.4  --   --  32.4 33.0  RDW 15.4  --   --  15.4 15.6*  PLT 252  --   --  229 216   < > = values in this interval not displayed.    Cardiac EnzymesNo results for input(s): TROPONINI in the last 168 hours. No results for input(s): TROPIPOC in the last 168 hours.   BNP Recent Labs  Lab 12/01/21 1023  BNP 102.6*     DDimer No results for input(s): DDIMER in the last 168 hours.   Radiology    CARDIAC CATHETERIZATION  Result Date: 12/05/2021   Hemodynamic findings consistent with severe pulmonary hypertension. No angiographic evidence of CAD Elevated right and left heart pressures. RA 17, RV 52/12/18, PA 61/29 mean 47, PCWP 33, LV 116/15/24, AO 111/85) Continue workup for mitral regurgitation and possible valve surgery. TEE planned later this week to assess valve and for DCCV. Continue diuresis as renal function allows.     Cardiac studies     Cardiac Cath  12/05/21\    Hemodynamic findings consistent with severe pulmonary hypertension.   No  angiographic evidence of CAD Elevated right and left heart pressures. RA 17, RV 52/12/18, PA 61/29 mean 47, PCWP 33, LV 116/15/24, AO 111/85)   Continue workup for mitral regurgitation and possible valve surgery. TEE planned later this week to assess valve and for DCCV. Continue diuresis as renal function allows.   Echo 12/02/21 Left ventricular ejection fraction, by estimation, is 50 to 55%. The left ventricle has low normal function. The left ventricle has no regional wall motion abnormalities. There is moderate concentric left ventricular hypertrophy. Diastolic function indeterminant due to Afib. 1. Right ventricular systolic function is mildly reduced. The right ventricular size is mildly enlarged. Tricuspid regurgitation signal is inadequate for assessing PA pressure. 2. 3. Left atrial size was severely dilated. 4. Right atrial size was mildly dilated. The mitral valve is moderately thickened and calcified with moderate-to-severe mitral annular calcification. Prior TTE in 2021 with A2/P2 prolapse which is difficult to appreciate on current study due to degree of leaflet calcification. There is central mitral regurgitation that is appears moderate vs moderate-to-severe, although, is underappreciated on color doppler 5. The aortic valve was not well visualized. There is moderate calcification of the aortic valve. There is moderate thickening of the aortic valve. Aortic valve regurgitation is trivial. Mild aortic valve stenosis. 6. The inferior vena cava is dilated in size with <50% respiratory variability, suggesting right atrial pressure of 15 mmHg.  Patient Profile     50 y.o. male CKD but with notable mitral and aortic valve calcifications, lost to folow up with AF RVR and HF symptoms  Assessment & Plan    HFpEF Pt admitted on 12/01/20 with SOB  He had been out of meds    Had  progressive worsening of breathing   Wt up 10 lb In ED was hypotensive and in afib wit hRVR    Started on po amiodarone and heparin  Underwent R and L heart cath 1/16 (see above)  No signficant CAD     R sided pressures elevated   LVEDP elevated    PCWP  33 Yesterday he diuresed some with lasix   Today Cr has bumped to 1.6 Very difficut situation.   The pt has MV regurgitation and atrial fibrillation   Both are exacerbating/limiting diuresis.   I think he would do much better in SR if he can maintain it   I have discussed with T Turner   Will assess at time of endoscopy     Atrial fibrillation   Pt with persistent atrial fibrillation in past  Last cardioversion was in September 2021  He has not been seen in cardiology since     This admit he presented in afib with RVR Not on Xarelto (couldn't afford) Currently on IV heparin, diltiazem 360, metoprolol  100 bid   Amiodarone 400 daily started this admit    I increased to bid    He remains in afib   Heart rates have improved   80s   SUggessting some amio effect     Plan for DCCV if no clot seen on TEE    Difficult   I think he would do better in SR     Can he maintatin with MR.   Keep on amio    Mitral regurgitation.   The pt has had echoes in the past that show severe annular calcification  On TTE MV diffiuclt to see   MR felt to be at least moderate    TEE in august 2021 2021 was limited as patient developed respiratory distress.  Plan for TEE to evaluate   OSA   Pt on CPAP  Metabolic syndrome    Pt does not have dx of DM but has metabolic syndrome   Has fatty liver   He does not have CAD at cath but needs risk factor modification.  Discussed diet yesterday: Skips breakfast   Lunch   Chicken sandwich   Dinner   Chickne and veggies   Does eat some bread Drinks 2 cans ginger ale per day   Discussed diet   He neds to stop sugars   Minimalize carbs   Would do better on keto type diet    For questions or updates, please contact [facility] Please consult [email or web address] for contact info under Cardiology/STEMI.       Signed, Dorris Carnes, MD  12/07/2021, 8:01 AM

## 2021-12-07 NOTE — Progress Notes (Signed)
ANTICOAGULATION CONSULT NOTE - Follow Up Consult  Pharmacy Consult for Heparin Indication: atrial fibrillation  Allergies  Allergen Reactions   Peanut-Containing Drug Products Nausea And Vomiting    Pt reports tolerance to small amount of peanut now    Patient Measurements: Height: 5\' 9"  (175.3 cm) Weight: 131 kg (288 lb 12.8 oz) IBW/kg (Calculated) : 70.7 Heparin Dosing Weight:  101.2 kg  Vital Signs: Temp: 97.4 F (36.3 C) (01/18 1332) Temp Source: Oral (01/18 1332) BP: 115/78 (01/18 1332) Pulse Rate: 99 (01/18 1332)  Labs: Recent Labs    12/05/21 0444 12/05/21 1312 12/05/21 1318 12/06/21 0311 12/06/21 1549 12/07/21 0313 12/07/21 1455  HGB 15.8   < > 16.0 14.9  --  16.3  --   HCT 48.7   < > 47.0 46.0  --  49.4  --   PLT 252  --   --  229  --  216  --   APTT 67*  --   --  49* 58* 68* 97*  HEPARINUNFRC >1.10*  --   --   --  0.53 0.53 0.81*  CREATININE 1.74*  --   --  1.17 1.33* 1.63*  --    < > = values in this interval not displayed.    Estimated Creatinine Clearance: 71.1 mL/min (A) (by C-G formula based on SCr of 1.63 mg/dL (H)).  Assessment: Anticoag: apix for AF (LD 1/14 AM) - dose ok>>transition to IV hep.  - HL 0.81 slightly elevated (from Eliquis), aPTT 97 in goal.  Goal of Therapy:  aPTT 66-102 seconds Monitor platelets by anticoagulation protocol: Yes   Plan:  Cont heparin at 2300 units/hr (aPTT 68 sec) Daily aPTT/heparin level/CBC    Preethi Scantlebury S. Alford Highland, PharmD, BCPS Clinical Staff Pharmacist [email or web address] [place], Sharlett Lienemann Stillinger 12/07/2021,4:14 PM

## 2021-12-07 NOTE — Progress Notes (Signed)
° °  Reviewed TEE images from earlier today with patient    MR is severe   Aortic valve with mildly restricted motion  LVEF normal  Patient failed to cardiovert to SR despite 3 attempts  REcomm: Would switch patient to IV amiodarone for loading.   Amio will help regulate HR and may make symptoms better even if doesn't convert.  Repeat BMET   Lasix was on hold after labs this am showed bump in Cr  Will review images with colleagues   Pt  needs repair and MAZE procedure.      Signed, Dorris Carnes, MD  12/07/2021, 3:25 PM
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
## 2021-12-07 NOTE — Anesthesia Preprocedure Evaluation (Addendum)
Anesthesia Evaluation  Patient identified by MRN, date of birth, ID band Patient awake    Reviewed: Allergy & Precautions, NPO status , Patient's Chart, lab work & pertinent test results, reviewed documented beta blocker date and time   Airway Mallampati: II  TM Distance: >3 FB Neck ROM: Full    Dental  (+) Teeth Intact, Dental Advisory Given   Pulmonary sleep apnea and Continuous Positive Airway Pressure Ventilation ,    Pulmonary exam normal breath sounds clear to auscultation       Cardiovascular +CHF and + DOE  + dysrhythmias Atrial Fibrillation + Valvular Problems/Murmurs (TR)  Rhythm:Irregular Rate:Abnormal     Neuro/Psych negative neurological ROS  negative psych ROS   GI/Hepatic negative GI ROS, Neg liver ROS,   Endo/Other  Morbid obesity  Renal/GU Renal disease (AKI)     Musculoskeletal negative musculoskeletal ROS (+)   Abdominal   Peds  Hematology  (+) Blood dyscrasia (Xarelto), ,   Anesthesia Other Findings Day of surgery medications reviewed with the patient.  Reproductive/Obstetrics                            Anesthesia Physical Anesthesia Plan  ASA: 3  Anesthesia Plan: General   Post-op Pain Management:    Induction: Intravenous  PONV Risk Score and Plan: 2 and TIVA  Airway Management Planned: Natural Airway and Simple Face Mask  Additional Equipment:   Intra-op Plan:   Post-operative Plan:   Informed Consent: I have reviewed the patients History and Physical, chart, labs and discussed the procedure including the risks, benefits and alternatives for the proposed anesthesia with the patient or authorized representative who has indicated his/her understanding and acceptance.     Dental advisory given  Plan Discussed with: CRNA  Anesthesia Plan Comments:        Anesthesia Quick Evaluation

## 2021-12-08 ENCOUNTER — Other Ambulatory Visit (HOSPITAL_COMMUNITY): Payer: Self-pay

## 2021-12-08 ENCOUNTER — Inpatient Hospital Stay (HOSPITAL_COMMUNITY): Payer: Self-pay

## 2021-12-08 ENCOUNTER — Other Ambulatory Visit: Payer: Self-pay

## 2021-12-08 LAB — BASIC METABOLIC PANEL
Anion gap: 12 (ref 5–15)
Anion gap: 17 — ABNORMAL HIGH (ref 5–15)
Anion gap: 11 (ref 5–15)
BUN: 37 mg/dL — ABNORMAL HIGH (ref 6–20)
BUN: 46 mg/dL — ABNORMAL HIGH (ref 6–20)
BUN: 46 mg/dL — ABNORMAL HIGH (ref 6–20)
CO2: 23 mmol/L (ref 22–32)
CO2: 20 mmol/L — ABNORMAL LOW (ref 22–32)
CO2: 24 mmol/L (ref 22–32)
Calcium: 8.6 mg/dL — ABNORMAL LOW (ref 8.9–10.3)
Calcium: 9.2 mg/dL (ref 8.9–10.3)
Calcium: 8.6 mg/dL — ABNORMAL LOW (ref 8.9–10.3)
Chloride: 99 mmol/L (ref 98–111)
Chloride: 94 mmol/L — ABNORMAL LOW (ref 98–111)
Chloride: 96 mmol/L — ABNORMAL LOW (ref 98–111)
Creatinine, Ser: 1.62 mg/dL — ABNORMAL HIGH (ref 0.61–1.24)
Creatinine, Ser: 2.03 mg/dL — ABNORMAL HIGH (ref 0.61–1.24)
Creatinine, Ser: 2.02 mg/dL — ABNORMAL HIGH (ref 0.61–1.24)
GFR, Estimated: 51 mL/min — ABNORMAL LOW (ref >60)
GFR, Estimated: 39 mL/min — ABNORMAL LOW (ref >60)
GFR, Estimated: 39 mL/min — ABNORMAL LOW (ref >60)
Glucose, Bld: 142 mg/dL — ABNORMAL HIGH (ref 70–99)
Glucose, Bld: 162 mg/dL — ABNORMAL HIGH (ref 70–99)
Glucose, Bld: 136 mg/dL — ABNORMAL HIGH (ref 70–99)
Potassium: 4.7 mmol/L (ref 3.5–5.1)
Potassium: 5.7 mmol/L — ABNORMAL HIGH (ref 3.5–5.1)
Potassium: 5.1 mmol/L (ref 3.5–5.1)
Sodium: 134 mmol/L — ABNORMAL LOW (ref 135–145)
Sodium: 131 mmol/L — ABNORMAL LOW (ref 135–145)
Sodium: 131 mmol/L — ABNORMAL LOW (ref 135–145)

## 2021-12-08 LAB — URINALYSIS, ROUTINE W REFLEX MICROSCOPIC
Glucose, UA: NEGATIVE mg/dL
Ketones, ur: NEGATIVE mg/dL
Nitrite: POSITIVE — AB
Protein, ur: 100 mg/dL — AB
Specific Gravity, Urine: 1.025 (ref 1.005–1.030)
pH: 5.5 (ref 5.0–8.0)

## 2021-12-08 LAB — CBC
HCT: 47.5 % (ref 39.0–52.0)
Hemoglobin: 15.7 g/dL (ref 13.0–17.0)
MCH: 29.1 pg (ref 26.0–34.0)
MCHC: 33.1 g/dL (ref 30.0–36.0)
MCV: 88 fL (ref 80.0–100.0)
Platelets: 248 10*3/uL (ref 150–400)
RBC: 5.4 MIL/uL (ref 4.22–5.81)
RDW: 15.7 % — ABNORMAL HIGH (ref 11.5–15.5)
WBC: 12.2 10*3/uL — ABNORMAL HIGH (ref 4.0–10.5)
nRBC: 0 % (ref 0.0–0.2)

## 2021-12-08 LAB — BLOOD GAS, ARTERIAL
Acid-base deficit: 0.5 mmol/L (ref 0.0–2.0)
Bicarbonate: 23.2 mmol/L (ref 20.0–28.0)
Drawn by: 252031
FIO2: 100
O2 Saturation: 98.8 %
Patient temperature: 37
pCO2 arterial: 34.9 mmHg (ref 32.0–48.0)
pH, Arterial: 7.438 (ref 7.350–7.450)
pO2, Arterial: 118 mmHg — ABNORMAL HIGH (ref 83.0–108.0)

## 2021-12-08 LAB — URINALYSIS, MICROSCOPIC (REFLEX): RBC / HPF: >50 RBC/hpf (ref 0–5)

## 2021-12-08 LAB — APTT: aPTT: 85 seconds — ABNORMAL HIGH (ref 24–36)

## 2021-12-08 LAB — HEPARIN LEVEL (UNFRACTIONATED): Heparin Unfractionated: 0.61 IU/mL (ref 0.30–0.70)

## 2021-12-08 MED ORDER — FUROSEMIDE 10 MG/ML IJ SOLN
120.0000 mg | Freq: Once | INTRAVENOUS | Status: AC
  Administered 2021-12-08: 120 mg via INTRAVENOUS
  Filled 2021-12-08: qty 10

## 2021-12-08 MED ORDER — FUROSEMIDE 10 MG/ML IJ SOLN
80.0000 mg | Freq: Once | INTRAMUSCULAR | Status: AC
  Administered 2021-12-08: 80 mg via INTRAVENOUS
  Filled 2021-12-08: qty 8

## 2021-12-08 MED ORDER — FUROSEMIDE 10 MG/ML IJ SOLN
80.0000 mg | Freq: Three times a day (TID) | INTRAMUSCULAR | Status: DC
  Administered 2021-12-08: 80 mg via INTRAVENOUS
  Filled 2021-12-08: qty 8

## 2021-12-08 MED ORDER — CHLORHEXIDINE GLUCONATE CLOTH 2 % EX PADS
6.0000 | MEDICATED_PAD | Freq: Every day | CUTANEOUS | Status: DC
  Administered 2021-12-09: 6 via TOPICAL

## 2021-12-08 NOTE — Progress Notes (Signed)
°   12/08/21 1440  Assess: MEWS Score  Pulse Rate 85  ECG Heart Rate 83  Resp (!) 26  SpO2 91 %  O2 Device Non-rebreather Mask  Patient Activity (if Appropriate) Other (Comment) (Patient sitting in chair tilted forward, reports very hard to breathe)  O2 Flow Rate (L/min) 15 L/min  Assess: MEWS Score  MEWS Temp 0  MEWS Systolic 0  MEWS Pulse 0  MEWS RR 2  MEWS LOC 0  MEWS Score 2  MEWS Score Color Yellow  Assess: if the MEWS score is Yellow or Red  Were vital signs taken at a resting state? Yes  Focused Assessment Change from prior assessment (see assessment flowsheet)  Early Detection of Sepsis Score *See Row Information* Low  MEWS guidelines implemented *See Row Information* Yes  Escalate  MEWS: Escalate Yellow: discuss with charge nurse/RN and consider discussing with provider and RRT  Notify: Charge Nurse/RN  Name of Charge Nurse/RN Notified [place]  Date Charge Nurse/RN Notified 12/08/21  Time Charge Nurse/RN Notified 1445  Notify: Provider  Provider Name/Title Jonah Blue  Date Provider Notified 12/08/21  Time Provider Notified 1440  Notification Type Page  Notification Reason Change in status  Provider response En route  Document  Patient Outcome Stabilized after interventions (patient placed on bipap)  Progress note created (see row info) Yes

## 2021-12-08 NOTE — Anesthesia Postprocedure Evaluation (Signed)
Anesthesia Post Note  Patient: Anthony Byrd  Procedure(s) Performed: TRANSESOPHAGEAL ECHOCARDIOGRAM (TEE) CARDIOVERSION     Patient location during evaluation: Endoscopy Anesthesia Type: General Level of consciousness: awake and alert Pain management: pain level controlled Vital Signs Assessment: post-procedure vital signs reviewed and stable Respiratory status: spontaneous breathing, nonlabored ventilation, respiratory function stable and patient connected to nasal cannula oxygen Cardiovascular status: blood pressure returned to baseline and stable Postop Assessment: no apparent nausea or vomiting Anesthetic complications: no   No notable events documented.  Last Vitals:  Vitals:   12/08/21 0334 12/08/21 0746  BP: 122/86   Pulse: (!) 117 94  Resp: (!) 21 (!) 24  Temp: 36.9 C 37 C  SpO2: 92%     Last Pain:  Vitals:   12/08/21 0334  TempSrc: Oral  PainSc: 0-No pain                 Santa Lighter

## 2021-12-08 NOTE — Progress Notes (Signed)
I spoke to patient about the possibility of intubation if his oxygen saturations does not hold up with BiPAP.  He agreed with intubation if needs to.  Dr. Lorin Glass and Anson Fret, RN were also in the room during the conversation.

## 2021-12-08 NOTE — Progress Notes (Addendum)
ANTICOAGULATION CONSULT NOTE - Follow Up Consult  Pharmacy Consult for Heparin Indication: atrial fibrillation  Allergies  Allergen Reactions   Peanut-Containing Drug Products Nausea And Vomiting    Pt reports tolerance to small amount of peanut now    Patient Measurements: Height: 5\' 9"  (175.3 cm) Weight: 132.9 kg (292 lb 15.9 oz) IBW/kg (Calculated) : 70.7 Heparin Dosing Weight:  101.2 kg  Vital Signs: Temp: 98.6 F (37 C) (01/19 0746) Temp Source: Oral (01/19 0746) BP: 119/102 (01/19 0746) Pulse Rate: 94 (01/19 0746)  Labs: Recent Labs    12/06/21 0311 12/06/21 0311 12/06/21 1549 12/07/21 0313 12/07/21 1455 12/08/21 0128  HGB 14.9  --   --  16.3  --  15.7  HCT 46.0  --   --  49.4  --  47.5  PLT 229  --   --  216  --  248  APTT 49*  --  58* 68* 97* 85*  HEPARINUNFRC  --    < > 0.53 0.53 0.81* 0.61  CREATININE 1.17  --  1.33* 1.63*  --  1.62*   < > = values in this interval not displayed.     Estimated Creatinine Clearance: 72.1 mL/min (A) (by C-G formula based on SCr of 1.62 mg/dL (H)).  Assessment: 51 yo male on apixaban PTA for AF (LD 1/14 AM). Pharmacy dosing heparin in case of procedures. He is noted with severe MR.  -aPTT at goal, CBC stable -heparin level at goal and appears to be correlating -hematuria noted by RN but this has resolved  Goal of Therapy:  aPTT 66-102 seconds Heparin level= 0.3-0.7 Monitor platelets by anticoagulation protocol: Yes   Plan:  Cont heparin at 2300 units/hr  Daily heparin level/CBC  D/c daily aPTT  Hildred Laser, PharmD Clinical Pharmacist **Pharmacist phone directory can now be found on [email or web address] ([email or web address]).  Listed under [place].

## 2021-12-08 NOTE — Progress Notes (Signed)
RN noticed urine looked bloody while on heparin gtt.  Patient states that his urine color fluctuates depending on what he drinks and if he has lasix.  He seems unconcerned with the state of his urine at this time.  No further bleeding noticed.  MD made aware.  No new orders.

## 2021-12-08 NOTE — Plan of Care (Signed)
°  Problem: Health Behavior/Discharge Planning: Goal: Ability to manage health-related needs will improve Outcome: Progressing   Problem: Clinical Measurements: Goal: Ability to maintain clinical measurements within normal limits will improve Outcome: Progressing Goal: Will remain free from infection Outcome: Progressing Goal: Diagnostic test results will improve Outcome: Progressing Goal: Respiratory complications will improve Outcome: Progressing Goal: Cardiovascular complication will be avoided Outcome: Progressing   Problem: Elimination: Goal: Will not experience complications related to bowel motility Outcome: Progressing Goal: Will not experience complications related to urinary retention Outcome: Progressing   Problem: Education: Goal: Knowledge of disease or condition will improve Outcome: Progressing Goal: Understanding of medication regimen will improve Outcome: Progressing Goal: Individualized Educational Video(s) Outcome: Progressing   Problem: Activity: Goal: Ability to tolerate increased activity will improve Outcome: Progressing   Problem: Cardiac: Goal: Ability to achieve and maintain adequate cardiopulmonary perfusion will improve Outcome: Progressing   Problem: Health Behavior/Discharge Planning: Goal: Ability to safely manage health-related needs after discharge will improve Outcome: Progressing

## 2021-12-08 NOTE — Progress Notes (Addendum)
HD#5 Subjective:  Overnight Events: NAE  Patient assessed at bedside this AM.  He states that he has been spitting up think white mucus.  He has noticed some dark urine but no bright red blood.  This started earlier this week.  Denies dysuria, abdominal pain or other urinary symptoms. His main concern is continued coughing. His breathing does not feel as good as yesterday.  No other concerns at this time.  Objective:  Vital signs in last 24 hours: Vitals:   12/08/21 0154 12/08/21 0334 12/08/21 0400 12/08/21 0746  BP:  122/86  (!) 119/102  Pulse:  (!) 117  94  Resp:  (!) 21  (!) 24  Temp:  98.4 F (36.9 C)  98.6 F (37 C)  TempSrc:  Oral  Oral  SpO2: 92% 92%  (!) 87%  Weight:   132.9 kg   Height:       Supplemental O2: Nasal Cannula SpO2: (!) 87 % O2 Flow Rate (L/min): 6 L/min  Physical Exam:  Physical Exam Constitutional:      General: He is not in acute distress.    Appearance: He is not ill-appearing.  HENT:     Head: Normocephalic.  Eyes:     General:        Right eye: No discharge.        Left eye: No discharge.     Conjunctiva/sclera: Conjunctivae normal.  Cardiovascular:     Rate and Rhythm: Normal rate. Rhythm irregular.     Comments: +1-2 bilateral lower extremity edema. Pulmonary:     Effort: Pulmonary effort is normal.     Breath sounds: Normal breath sounds.     Comments: No crackles or wheezing Abdominal:     General: Bowel sounds are normal.     Palpations: Abdomen is soft.     Tenderness: There is no abdominal tenderness. There is no guarding or rebound.  Skin:    General: Skin is warm.  Neurological:     General: No focal deficit present.     Mental Status: He is alert.  Psychiatric:        Mood and Affect: Mood normal.        Behavior: Behavior normal.    Filed Weights   12/05/21 0726 12/07/21 0926 12/08/21 0400  Weight: 131.8 kg 131 kg 132.9 kg    Intake/Output Summary (Last 24 hours) at 12/08/2021 1059 Last data filed at 12/08/2021  0946 Gross per 24 hour  Intake 3069.29 ml  Output 2800 ml  Net 269.29 ml   Net IO Since Admission: -5,346.35 mL [12/08/21 1059]  Pertinent Labs: CBC Latest Ref Rng & Units 12/08/2021 12/07/2021 12/06/2021  WBC 4.0 - 10.5 K/uL 12.2(H) 10.6(H) 10.9(H)  Hemoglobin 13.0 - 17.0 g/dL 15.7 16.3 14.9  Hematocrit 39.0 - 52.0 % 47.5 49.4 46.0  Platelets 150 - 400 K/uL 248 216 229    CMP Latest Ref Rng & Units 12/08/2021 12/07/2021 12/06/2021  Glucose 70 - 99 mg/dL 142(H) 129(H) 107(H)  BUN 6 - 20 mg/dL 37(H) 39(H) 32(H)  Creatinine 0.61 - 1.24 mg/dL 1.62(H) 1.63(H) 1.33(H)  Sodium 135 - 145 mmol/L 134(L) 133(L) 132(L)  Potassium 3.5 - 5.1 mmol/L 4.7 4.5 4.0  Chloride 98 - 111 mmol/L 99 98 96(L)  CO2 22 - 32 mmol/L 23 23 26   Calcium 8.9 - 10.3 mg/dL 8.6(L) 8.2(L) 8.5(L)  Total Protein 6.5 - 8.1 g/dL - - -  Total Bilirubin 0.3 - 1.2 mg/dL - - -  Alkaline  Phos 38 - 126 U/L - - -  AST 15 - 41 U/L - - -  ALT 0 - 44 U/L - - -    Imaging: DG CHEST PORT 1 VIEW  Result Date: 12/08/2021 CLINICAL DATA:  Reason for exam: acute respiratory failure with hypoxia Patient reports worsening sob since cardioversion yesterday. Hx of afib, multiple prior cardioversions. EXAM: PORTABLE CHEST - 1 VIEW COMPARISON:  the previous day's study FINDINGS: Worsening of bilateral diffuse infiltrates or edema with relative peripheral sparing in the the upper lobes. Heart size upper limits normal. Blunting of left lateral costophrenic angle suggesting small effusion. Visualized bones unremarkable. IMPRESSION: Worsening bilateral edema or infiltrates. Electronically Signed   By: Lucrezia Europe M.D.   On: 12/08/2021 08:02   ECHO TEE  Result Date: 12/07/2021    TRANSESOPHOGEAL ECHO REPORT   Patient Name:   Anthony Byrd Date of Exam: 12/07/2021 Medical Rec #:  [id number]      Height:       69.0 in Accession #:    [id number]     Weight:       290.5 lb Date of Birth:  03/25/1971      BSA:          2.421 m Patient Age:    51 years        BP:           130/82 mmHg Patient Gender: M              HR:           107 bpm. Exam Location:  Inpatient Procedure: Transesophageal Echo, Limited Color Doppler, Cardiac Doppler and            Intracardiac Opacification Agent Indications:     mitral regurgitation  History:         Patient has prior history of Echocardiogram examinations, most                  recent 12/02/2021. CHF, Arrythmias:Atrial Fibrillation; Risk                  Factors:Sleep Apnea.  Sonographer:     Johny Chess RDCS Referring Phys:  [id number] PAULA V ROSS Diagnosing Phys: Fransico Him MD PROCEDURE: After discussion of the risks and benefits of a TEE, an informed consent was obtained from the patient. The transesophogeal probe was passed without difficulty through the esophogus of the patient. Imaged were obtained with the patient in a left lateral decubitus position. Sedation performed by different physician. The patient was monitored while under deep sedation. Anesthestetic sedation was provided intravenously by Anesthesiology: 746mg  of Propofol. The patient's vital signs; including heart rate, blood pressure, and oxygen saturation; remained stable throughout the procedure. The patient developed no complications during the procedure. A direct current cardioversion was performed. IMPRESSIONS  1. Left ventricular ejection fraction, by estimation, is 50 to 55%. The left ventricle has low normal function. The left ventricle demonstrates global hypokinesis.  2. Right ventricular systolic function is mildly reduced. The right ventricular size is mildly enlarged.  3. There is a nonmobile density in the mouth of the LA appendage. The LAA completely opacifies with definity contrast with no echolucent area corresponding to the area where the density was noted. This is consistent with artifact and not thrombus. . Left atrial size was severely dilated. No left atrial/left atrial appendage thrombus was detected. The LAA emptying velocity was 60 cm/s.   4. Right atrial size was mildly dilated.  5. Degenerative MV with moderately thickened and midly calcified leaflets mainly at the leaflet base and moderate mitral annular calcification. 3D images were obtained. There is possible mild late systolic prolapse of the A2 segment. There is severe mitral regurgitation eccentrically directed towards the posterior wall with flow into the left upper pulmonary vein. There is intermittent flow reversal in the LUPV consistent with severe MR. Suspect MR is related to annular dilatation as well as possible A2 prolapse.  6. The aortic valve is calcified. There is moderate calcification of the aortic valve. Aortic valve regurgitation is not visualized. Moderate aortic valve stenosis. Planimetered AVA was 1.19cm2.  7. The inferior vena cava is normal in size with greater than 50% respiratory variability, suggesting right atrial pressure of 3 mmHg. Conclusion(s)/Recommendation(s):Low Normal LVF with EF 50-55%, no evidence of LA or LAA thrombus by definity contrast study, severe MR with evidence of intermittent flow reversal in ileft upper pulmonary vein, Moderate aortic stenosis. FINDINGS  Left Ventricle: Left ventricular ejection fraction, by estimation, is 50 to 55%. The left ventricle has low normal function. The left ventricle demonstrates global hypokinesis. Definity contrast agent was given IV to delineate the left ventricular endocardial borders. The left ventricular internal cavity size was normal in size. There is no left ventricular hypertrophy. Right Ventricle: The right ventricular size is mildly enlarged. No increase in right ventricular wall thickness. Right ventricular systolic function is mildly reduced. Left Atrium: There is a nonmobile density in the mouth of the LA appendage. The LAA completely opacifies with definity contrast with no echolucent area corresponding to the area where the density was noted. This is consistent with artifact and not thrombus. Left atrial  size was severely dilated. No left atrial/left atrial appendage thrombus was detected. The LAA emptying velocity was 60 cm/s. Right Atrium: Right atrial size was mildly dilated. Pericardium: There is no evidence of pericardial effusion. Mitral Valve: Degenerative MV with moderately thickened and midly calcified leaflets mainly at the leaflet base and moderate mitral annular calcification. 3D images were obtained. There is possible mild prolapse of the A2 segment. There is severe mitral regurgitation eccentrically directed towards the posterior wall with flow into the left upper pulmonary vein. There is intermittent flow reversal in the LUPV consistent with severe MR. Suspect MR is related to annular dilatation as well as possible A2 prolapse. The mitral valve is degenerative in appearance. There is mild late systolic prolapse of the middle segment of the anterior leaflet of the mitral valve. Severe mitral valve regurgitation, with eccentric posteriorly directed jet. No evidence of mitral valve stenosis. Tricuspid Valve: The tricuspid valve is normal in structure. Tricuspid valve regurgitation is trivial. No evidence of tricuspid stenosis. Aortic Valve: Planimetered AVA was 1.19cm2. The aortic valve is calcified. There is moderate calcification of the aortic valve. Aortic valve regurgitation is not visualized. Moderate aortic stenosis is present. Pulmonic Valve: The pulmonic valve was normal in structure. Pulmonic valve regurgitation is not visualized. No evidence of pulmonic stenosis. Aorta: The aortic root is normal in size and structure. Venous: The inferior vena cava is normal in size with greater than 50% respiratory variability, suggesting right atrial pressure of 3 mmHg. IAS/Shunts: No atrial level shunt detected by color flow Doppler. Fransico Him MD Electronically signed by Fransico Him MD Signature Date/Time: 12/07/2021/7:58:24 PM    Final     Assessment/Plan:   Principal Problem:   AKI (acute kidney  injury) ([place]) Active Problems:   Atrial fibrillation ([place])   DOE (dyspnea on exertion)  Atrial fibrillation with RVR (HCC)   Acute diastolic CHF (congestive heart failure), NYHA class 4 (HCC)   Severe mitral regurgitation   Nonrheumatic mitral valve regurgitation  Patient Summary: Anthony Byrd is a 51 y.o. with a pertinent PMH of persistent atrial fibrillation s/p cardioversion in 2021 and not on anticoagulation, HFrEF (EF 55-65 in 2021), moderate-severe mitral regurgitation, and OSA not on CPAP, who presented with SOB and nausea and admitted for AKI and Afib with RVR.   Persistent valvular Atrial Fibrillation with RVR Severe mitral regurgitation Echo showed EF 50-55% with moderate to severe MR. Afib is likely 2/2 MR causing severe LA dilation. Left and right heart cath showed no angiographic evidence of CAD, but showed elevated right and left heart pressures; RA 17, RV 52/12/18, PA 61/29 mean 47, PCWP 33, LV 116/15/24, AO 111/85).  Rates in 90s-100s, remains in Afib, now on amnio drip after 3 attempts to cardiovert the patient yesterday.  This morning, the patient complained of worsening shortness of breath and a cough, and he was satting in the high 80s on 6 L in the room.  We obtained a chest x-ray which showed worsening infiltrates.  We will give IV Lasix 80 mg x 2 today, and we will continue working with cardiology to optimize the patient's volume status.  Otherwise, cardiology will explore options for valve repair, possibly at [facility]. -Appreciate cardiology recommendations  -IV Lasix 80 mg x 2 today -Continue amiodarone gtt, diltiazem 360 mg, and Lopressor 100 mg BID -Telemetry -Continue heparin  HFpEF exacerbation  Put out 1.5 L of fluid but weight increased. Patient is unsure of his dry weight.  Appears volume overloaded on exam with LE edema, SOB, and worsening infiltrates on CXR.  -Diurese as above -Strict I's/O's -Daily weights   Acute Kidney Injury, improving Creatinine of  1.85 on admit, improving with diuresis and most recent Cr at 1.6. - Diuresis as above - Encourage PO intake  - Trend BMP - Avoid nephrotoxic agents   Hematuria Overnight, the patient had some episodes of hematuria.  He states that this is been an intermittent issue for him, and he says this happens when he takes his Lasix sometimes.  Renal ultrasound significant for nonobstructing renal calculi.  Per chart review, this has been an issue for the patient since 2020.  He was advised to follow-up with urologist but was never able to.  At this time, he denies flank pain, abdominal pain, dysuria, or any other symptoms besides hematuria. -UA pending  Best Practice: Diet: Cardiac diet IVF: Fluids: None, Rate: None VTE:  Heparin Code: Full AB: None DISPO: Anticipated discharge pending cardiology interventions and medical stability.  Orvis Brill, MD 12/08/2021, 10:59 AM Pager: [phone] Please contact the on call pager after 5 pm and on weekends at [phone].

## 2021-12-08 NOTE — Progress Notes (Signed)
Progress Note  Patient Name: Anthony Byrd Date of Encounter: 12/08/2021  Primary Cardiologist: [name], MD   Subjective   Patient very SOB this AM   Sitting in chair   (Up to 6L )  Inpatient Medications    Scheduled Meds:  diltiazem  360 mg Oral Daily   furosemide  80 mg Intravenous Once   metoprolol tartrate  100 mg Oral BID   multivitamin with minerals  1 tablet Oral Daily   potassium chloride SA  20 mEq Oral Daily   sodium chloride flush  3 mL Intravenous Q12H   sodium chloride flush  3 mL Intravenous Q12H   Continuous Infusions:  sodium chloride Stopped (12/05/21 1613)   amiodarone 30 mg/hr (12/08/21 0400)   heparin 2,300 Units/hr (12/08/21 0400)   PRN Meds: sodium chloride, acetaminophen **OR** acetaminophen, benzonatate, guaiFENesin-dextromethorphan, polyethylene glycol, sodium chloride, sodium chloride flush   Vital Signs    Vitals:   12/07/21 2300 12/08/21 0154 12/08/21 0334 12/08/21 0400  BP:   122/86   Pulse: (!) 111  (!) 117   Resp: 20  (!) 21   Temp: 98.7 F (37.1 C)  98.4 F (36.9 C)   TempSrc:   Oral   SpO2: 93% 92% 92%   Weight:    132.9 kg  Height:        Intake/Output Summary (Last 24 hours) at 12/08/2021 0745 Last data filed at 12/08/2021 0400 Gross per 24 hour  Intake 2349.29 ml  Output 1500 ml  Net 849.29 ml   Net net  Filed Weights   12/05/21 0726 12/07/21 0926 12/08/21 0400  Weight: 131.8 kg 131 kg 132.9 kg    Telemetry    Afib 90s to 100s   - Personally Reviewed  ECG    No new      Physical Exam   Gen:  Morbidly obese 51 yo in no acute distress    Sitting in chair   Neck: Full Cardiac   Irreg irreg  No S3   Distant  heart sounds   Respiratory: CTA   GI: Obese   Non tender   MS: 1-2+   LE edema  Integument: Chronic skin changes legs   Neuro:  A and O x 3     Labs    Chemistry Recent Labs  Lab 12/01/21 1023 12/02/21 0437 12/06/21 1549 12/07/21 0313 12/08/21 0128  NA 136   < > 132* 133* 134*  K  3.7   < > 4.0 4.5 4.7  CL 99   < > 96* 98 99  CO2 24   < > 26 23 23   GLUCOSE 138*   < > 107* 129* 142*  BUN 31*   < > 32* 39* 37*  CREATININE 1.85*   < > 1.33* 1.63* 1.62*  CALCIUM 9.2   < > 8.5* 8.2* 8.6*  PROT 6.7  --   --   --   --   ALBUMIN 3.2*  --   --   --   --   AST 44*  --   --   --   --   ALT 41  --   --   --   --   ALKPHOS 61  --   --   --   --   BILITOT 1.4*  --   --   --   --   GFRNONAA 43*   < > >60 50* 51*  ANIONGAP 13   < > 10  12 12   < > = values in this interval not displayed.     Hematology Recent Labs  Lab 12/06/21 0311 12/07/21 0313 12/08/21 0128  WBC 10.9* 10.6* 12.2*  RBC 5.26 5.68 5.40  HGB 14.9 16.3 15.7  HCT 46.0 49.4 47.5  MCV 87.5 87.0 88.0  MCH 28.3 28.7 29.1  MCHC 32.4 33.0 33.1  RDW 15.4 15.6* 15.7*  PLT 229 216 248    Cardiac EnzymesNo results for input(s): TROPONINI in the last 168 hours. No results for input(s): TROPIPOC in the last 168 hours.   BNP Recent Labs  Lab 12/01/21 1023  BNP 102.6*     DDimer No results for input(s): DDIMER in the last 168 hours.   Radiology    DG CHEST PORT 1 VIEW  Result Date: 12/07/2021 CLINICAL DATA:  Shortness of breath EXAM: PORTABLE CHEST 1 VIEW COMPARISON:  Chest x-ray dated December 01, 2021 FINDINGS: Unchanged enlarged cardiac contours. Bilateral interstitial opacities. Small left pleural effusion. No evidence of pneumothorax. IMPRESSION: 1. Similar to slightly increased bilateral interstitial opacities which are likely due to pulmonary edema. 2. Unchanged small left pleural effusion. Electronically Signed   By: Yetta Glassman M.D.   On: 12/07/2021 10:26   ECHO TEE  Result Date: 12/07/2021    TRANSESOPHOGEAL ECHO REPORT   Patient Name:   [name] Date of Exam: 12/07/2021 Medical Rec #:  [id number]      Height:       69.0 in Accession #:    [id number]     Weight:       290.5 lb Date of Birth:  05-Jan-1971      BSA:          2.421 m Patient Age:    51 years       BP:           130/82 mmHg  Patient Gender: M              HR:           107 bpm. Exam Location:  Inpatient Procedure: Transesophageal Echo, Limited Color Doppler, Cardiac Doppler and            Intracardiac Opacification Agent Indications:     mitral regurgitation  History:         Patient has prior history of Echocardiogram examinations, most                  recent 12/02/2021. CHF, Arrythmias:Atrial Fibrillation; Risk                  Factors:Sleep Apnea.  Sonographer:     Johny Chess RDCS Referring Phys:  [id number] Jeffrie Lofstrom V Shawnay Bramel Diagnosing Phys: Fransico Him MD PROCEDURE: After discussion of the risks and benefits of a TEE, an informed consent was obtained from the patient. The transesophogeal probe was passed without difficulty through the esophogus of the patient. Imaged were obtained with the patient in a left lateral decubitus position. Sedation performed by different physician. The patient was monitored while under deep sedation. Anesthestetic sedation was provided intravenously by Anesthesiology: 746mg  of Propofol. The patient's vital signs; including heart rate, blood pressure, and oxygen saturation; remained stable throughout the procedure. The patient developed no complications during the procedure. A direct current cardioversion was performed. IMPRESSIONS  1. Left ventricular ejection fraction, by estimation, is 50 to 55%. The left ventricle has low normal function. The left ventricle demonstrates global hypokinesis.  2. Right ventricular systolic function is mildly  reduced. The right ventricular size is mildly enlarged.  3. There is a nonmobile density in the mouth of the LA appendage. The LAA completely opacifies with definity contrast with no echolucent area corresponding to the area where the density was noted. This is consistent with artifact and not thrombus. . Left atrial size was severely dilated. No left atrial/left atrial appendage thrombus was detected. The LAA emptying velocity was 60 cm/s.  4. Right atrial size was  mildly dilated.  5. Degenerative MV with moderately thickened and midly calcified leaflets mainly at the leaflet base and moderate mitral annular calcification. 3D images were obtained. There is possible mild late systolic prolapse of the A2 segment. There is severe mitral regurgitation eccentrically directed towards the posterior wall with flow into the left upper pulmonary vein. There is intermittent flow reversal in the LUPV consistent with severe MR. Suspect MR is related to annular dilatation as well as possible A2 prolapse.  6. The aortic valve is calcified. There is moderate calcification of the aortic valve. Aortic valve regurgitation is not visualized. Moderate aortic valve stenosis. Planimetered AVA was 1.19cm2.  7. The inferior vena cava is normal in size with greater than 50% respiratory variability, suggesting right atrial pressure of 3 mmHg. Conclusion(s)/Recommendation(s):Low Normal LVF with EF 50-55%, no evidence of LA or LAA thrombus by definity contrast study, severe MR with evidence of intermittent flow reversal in ileft upper pulmonary vein, Moderate aortic stenosis. FINDINGS  Left Ventricle: Left ventricular ejection fraction, by estimation, is 50 to 55%. The left ventricle has low normal function. The left ventricle demonstrates global hypokinesis. Definity contrast agent was given IV to delineate the left ventricular endocardial borders. The left ventricular internal cavity size was normal in size. There is no left ventricular hypertrophy. Right Ventricle: The right ventricular size is mildly enlarged. No increase in right ventricular wall thickness. Right ventricular systolic function is mildly reduced. Left Atrium: There is a nonmobile density in the mouth of the LA appendage. The LAA completely opacifies with definity contrast with no echolucent area corresponding to the area where the density was noted. This is consistent with artifact and not thrombus. Left atrial size was severely  dilated. No left atrial/left atrial appendage thrombus was detected. The LAA emptying velocity was 60 cm/s. Right Atrium: Right atrial size was mildly dilated. Pericardium: There is no evidence of pericardial effusion. Mitral Valve: Degenerative MV with moderately thickened and midly calcified leaflets mainly at the leaflet base and moderate mitral annular calcification. 3D images were obtained. There is possible mild prolapse of the A2 segment. There is severe mitral regurgitation eccentrically directed towards the posterior wall with flow into the left upper pulmonary vein. There is intermittent flow reversal in the LUPV consistent with severe MR. Suspect MR is related to annular dilatation as well as possible A2 prolapse. The mitral valve is degenerative in appearance. There is mild late systolic prolapse of the middle segment of the anterior leaflet of the mitral valve. Severe mitral valve regurgitation, with eccentric posteriorly directed jet. No evidence of mitral valve stenosis. Tricuspid Valve: The tricuspid valve is normal in structure. Tricuspid valve regurgitation is trivial. No evidence of tricuspid stenosis. Aortic Valve: Planimetered AVA was 1.19cm2. The aortic valve is calcified. There is moderate calcification of the aortic valve. Aortic valve regurgitation is not visualized. Moderate aortic stenosis is present. Pulmonic Valve: The pulmonic valve was normal in structure. Pulmonic valve regurgitation is not visualized. No evidence of pulmonic stenosis. Aorta: The aortic root is normal in size  and structure. Venous: The inferior vena cava is normal in size with greater than 50% respiratory variability, suggesting right atrial pressure of 3 mmHg. IAS/Shunts: No atrial level shunt detected by color flow Doppler. Fransico Him MD Electronically signed by Fransico Him MD Signature Date/Time: 12/07/2021/7:58:24 PM    Final      Cardiac studies     TEE    12/07/21  1. Left ventricular ejection fraction,  by estimation, is 50 to 55%. The  left ventricle has low normal function. The left ventricle demonstrates  global hypokinesis.   2. Right ventricular systolic function is mildly reduced. The right  ventricular size is mildly enlarged.   3. There is a nonmobile density in the mouth of the LA appendage. The LAA  completely opacifies with definity contrast with no echolucent area  corresponding to the area where the density was noted. This is consistent  with artifact and not thrombus. .  Left atrial size was severely dilated. No left atrial/left atrial  appendage thrombus was detected. The LAA emptying velocity was 60 cm/s.   4. Right atrial size was mildly dilated.   5. Degenerative MV with moderately thickened and midly calcified leaflets  mainly at the leaflet base and moderate mitral annular calcification. 3D  images were obtained. There is possible mild late systolic prolapse of the  A2 segment. There is severe  mitral regurgitation eccentrically directed towards the posterior wall  with flow into the left upper pulmonary vein. There is intermittent flow  reversal in the LUPV consistent with severe MR. Suspect MR is related to  annular dilatation as well as  possible A2 prolapse.    6. The aortic valve is calcified. There is moderate calcification of the  aortic valve. Aortic valve regurgitation is not visualized. Moderate  aortic valve stenosis. Planimetered AVA was 1.19cm2.   7. The inferior vena cava is normal in size with greater than 50%  respiratory variability, suggesting right atrial pressure of 3 mmHg.   Conclusion(s)/Recommendation(s):Low Normal LVF with EF 50-55%, no evidence  of LA or LAA thrombus by definity contrast study, severe MR with evidence  of intermittent flow reversal in ileft upper pulmonary vein, Moderate  aortic stenosis.  Cardiac Cath  12/05/21\    Hemodynamic findings consistent with severe pulmonary hypertension.   No angiographic evidence of  CAD Elevated right and left heart pressures. RA 17, RV 52/12/18, PA 61/29 mean 47, PCWP 33, LV 116/15/24, AO 111/85)   Continue workup for mitral regurgitation and possible valve surgery. TEE planned later this week to assess valve and for DCCV. Continue diuresis as renal function allows.   Echo 12/02/21 Left ventricular ejection fraction, by estimation, is 50 to 55%. The left ventricle has low normal function. The left ventricle has no regional wall motion abnormalities. There is moderate concentric left ventricular hypertrophy. Diastolic function indeterminant due to Afib. 1. Right ventricular systolic function is mildly reduced. The right ventricular size is mildly enlarged. Tricuspid regurgitation signal is inadequate for assessing PA pressure. 2. 3. Left atrial size was severely dilated. 4. Right atrial size was mildly dilated. The mitral valve is moderately thickened and calcified with moderate-to-severe mitral annular calcification. Prior TTE in 2021 with A2/P2 prolapse which is difficult to appreciate on current study due to degree of leaflet calcification. There is central mitral regurgitation that is appears moderate vs moderate-to-severe, although, is underappreciated on color doppler 5. The aortic valve was not well visualized. There is moderate calcification of the aortic valve. There is moderate  thickening of the aortic valve. Aortic valve regurgitation is trivial. Mild aortic valve stenosis. 6. The inferior vena cava is dilated in size with <50% respiratory variability, suggesting right atrial pressure of 15 mmHg.  Patient Profile     51 y.o. male CKD but with notable mitral and aortic valve calcifications, lost to folow up with AF RVR and HF symptoms  Assessment & Plan    HFpEF Pt admitted on 12/01/20 with SOB  He had been out of meds    Had progressive worsening of breathing   Wt up 10 lb In ED was hypotensive and in afib wit hRVR   Started on po amiodarone and  heparin  Underwent R and L heart cath 1/16 (see above)  No signficant CAD     R sided pressures elevated   LVEDP elevated    PCWP  33  TEE yesterday with severe MR      Lasix given in am yesterday    Will resume today   Follow response.  Hopefully will be able to taper back on O2.    Follow Cr  I do not think pt will be able to leave hospital  Needs valve repair.    I have contacted Dr Aundra Millet office and will plan to contact [facility] for Tx    Mitral Regurgitation  TEE yesterday demonstrated severe MR   Appears secondary to annular dilitation   There may be some mild anterior prolapse of A2. Reviewed with interventional service    Will contact [facility] and Dr Cheree Ditto for tx for MV repair    Atrial fibrillation   Pt with persistent atrial fibrillation in past  Last cardioversion was in September 2021  He did not come back for follow up until now   Presented on 1/12 in afib with RVR   had been off of Xarelto (couldn't afford)  TEE yesterday negative for thrombus    Cardioversion attempted x 3   Unsuccessful.     Currently on IV heparin, diltiazem 360, metoprolol  100 bid   Amiodarone (po) started earlier in admit   Now on IV amio Rates better than earlier in week   Continue load Would recomm MAZE  procedure   OSA   Pt uses CPAP at home     Metabolic syndrome    Pt does not have dx of DM but has metabolic syndrome   Has fatty liver   He does not have CAD at cath but needs risk factor modification.  Limt carbs/sugar   For after d/c    For questions or updates, please contact [facility] Please consult [email or web address] for contact info under Cardiology/STEMI.      Signed, Dorris Carnes, MD  12/08/2021, 7:45 AM

## 2021-12-08 NOTE — Progress Notes (Addendum)
Paged by RN for chest tightness and worsening SOB. The pt was evaluated at bedside. Initially on nonrebreather and satting around 94%. Took off NRB and satting around 88% on 10 L HFNC.   The pt says that he feels worse compared to this morning and that breathing in takes a lot of effort for him. This is despite receiving 80 mg Lasix 1 hour ago. He has peed some, but not much yet. Noted to have increased WOB and satting 88% on 10 L HFNC. Exam significant for decreased breath sounds at the bilateral lower lung bases.   Plan: Though he is satting ok on 10 L, he has noticeable increased WOB. Therefore, will start BiPAP while we continue to diurese him. Will reassess in the next hour to determine further plans for diuresis.

## 2021-12-08 NOTE — Progress Notes (Signed)
Received a call earlier from Dr. Harrington Challenger, Cardiology.  She advised since the Pt was placed on Bi-Pap and CXR, she wanted him in the ICU.  She asked if he was to be tx to [facility] advised that we had not heard that it was happening today.  She said she will get him over to ICU tonight. She called back and put order in for ICU. Pt was Tx to 2H Rm 11 without incident.

## 2021-12-08 NOTE — Progress Notes (Signed)
Patient placed on BIPAP at this time for increased WOB and O2 demand.

## 2021-12-08 NOTE — Progress Notes (Signed)
RT transported patient from 2C01 to [facility] without event.

## 2021-12-08 NOTE — Progress Notes (Signed)
Patient states breathing much easier after voiding numerous times for 1200 out

## 2021-12-08 NOTE — Progress Notes (Signed)
Per lab, not enough blood in ABG sample to run.

## 2021-12-09 ENCOUNTER — Inpatient Hospital Stay: Payer: Self-pay

## 2021-12-09 ENCOUNTER — Inpatient Hospital Stay (HOSPITAL_COMMUNITY): Payer: Self-pay

## 2021-12-09 ENCOUNTER — Encounter (HOSPITAL_COMMUNITY): Payer: Self-pay | Admitting: Cardiology

## 2021-12-09 LAB — COOXEMETRY PANEL
Carboxyhemoglobin: 1.5 % (ref 0.5–1.5)
Methemoglobin: 0.9 % (ref 0.0–1.5)
O2 Saturation: 77.1 %
Total hemoglobin: 14.5 g/dL (ref 12.0–16.0)

## 2021-12-09 LAB — CBC
HCT: 45.2 % (ref 39.0–52.0)
Hemoglobin: 14.5 g/dL (ref 13.0–17.0)
MCH: 28.3 pg (ref 26.0–34.0)
MCHC: 32.1 g/dL (ref 30.0–36.0)
MCV: 88.1 fL (ref 80.0–100.0)
Platelets: 236 10*3/uL (ref 150–400)
RBC: 5.13 MIL/uL (ref 4.22–5.81)
RDW: 15.7 % — ABNORMAL HIGH (ref 11.5–15.5)
WBC: 13.2 10*3/uL — ABNORMAL HIGH (ref 4.0–10.5)
nRBC: 0 % (ref 0.0–0.2)

## 2021-12-09 LAB — BASIC METABOLIC PANEL
Anion gap: 10 (ref 5–15)
Anion gap: 10 (ref 5–15)
BUN: 40 mg/dL — ABNORMAL HIGH (ref 6–20)
BUN: 27 mg/dL — ABNORMAL HIGH (ref 6–20)
CO2: 27 mmol/L (ref 22–32)
CO2: 27 mmol/L (ref 22–32)
Calcium: 8.5 mg/dL — ABNORMAL LOW (ref 8.9–10.3)
Calcium: 7.7 mg/dL — ABNORMAL LOW (ref 8.9–10.3)
Chloride: 96 mmol/L — ABNORMAL LOW (ref 98–111)
Chloride: 95 mmol/L — ABNORMAL LOW (ref 98–111)
Creatinine, Ser: 1.75 mg/dL — ABNORMAL HIGH (ref 0.61–1.24)
Creatinine, Ser: 1.4 mg/dL — ABNORMAL HIGH (ref 0.61–1.24)
GFR, Estimated: 46 mL/min — ABNORMAL LOW (ref >60)
GFR, Estimated: >60 mL/min (ref >60)
Glucose, Bld: 107 mg/dL — ABNORMAL HIGH (ref 70–99)
Glucose, Bld: 237 mg/dL — ABNORMAL HIGH (ref 70–99)
Potassium: 4.2 mmol/L (ref 3.5–5.1)
Potassium: 3.6 mmol/L (ref 3.5–5.1)
Sodium: 133 mmol/L — ABNORMAL LOW (ref 135–145)
Sodium: 132 mmol/L — ABNORMAL LOW (ref 135–145)

## 2021-12-09 LAB — MRSA NEXT GEN BY PCR, NASAL: MRSA by PCR Next Gen: NOT DETECTED

## 2021-12-09 LAB — HEPARIN LEVEL (UNFRACTIONATED): Heparin Unfractionated: 0.38 IU/mL (ref 0.30–0.70)

## 2021-12-09 MED ORDER — CHLORHEXIDINE GLUCONATE 0.12 % MT SOLN
15.0000 mL | Freq: Two times a day (BID) | OROMUCOSAL | Status: DC
  Administered 2021-12-09 (×2): 15 mL via OROMUCOSAL
  Filled 2021-12-09: qty 15

## 2021-12-09 MED ORDER — ETOMIDATE 2 MG/ML IV SOLN
INTRAVENOUS | Status: AC
  Filled 2021-12-09: qty 20

## 2021-12-09 MED ORDER — METOPROLOL TARTRATE 25 MG PO TABS: 25.0000 mg | ORAL_TABLET | Freq: Four times a day (QID) | ORAL | Status: DC

## 2021-12-09 MED ORDER — MILRINONE LACTATE IN DEXTROSE 20-5 MG/100ML-% IV SOLN
0.1250 ug/kg/min | INTRAVENOUS | Status: DC
  Administered 2021-12-09 (×2): 0.125 ug/kg/min via INTRAVENOUS
  Filled 2021-12-09: qty 200

## 2021-12-09 MED ORDER — ROCURONIUM BROMIDE 10 MG/ML (PF) SYRINGE
PREFILLED_SYRINGE | INTRAVENOUS | Status: AC
  Filled 2021-12-09: qty 10

## 2021-12-09 MED ORDER — ORAL CARE MOUTH RINSE
15.0000 mL | Freq: Two times a day (BID) | OROMUCOSAL | Status: DC
  Administered 2021-12-09: 15 mL via OROMUCOSAL

## 2021-12-09 MED ORDER — FUROSEMIDE 10 MG/ML IJ SOLN
80.0000 mg | Freq: Two times a day (BID) | INTRAMUSCULAR | Status: DC
  Administered 2021-12-09: 80 mg via INTRAVENOUS
  Filled 2021-12-09: qty 8

## 2021-12-09 MED ORDER — MILRINONE LACTATE IN DEXTROSE 20-5 MG/100ML-% IV SOLN: 0.1250 ug/kg/min | INTRAVENOUS | Status: DC

## 2021-12-09 MED ORDER — FUROSEMIDE 10 MG/ML IJ SOLN: 80.0000 mg | Freq: Two times a day (BID) | INTRAMUSCULAR | 0 refills | Status: DC

## 2021-12-09 MED ORDER — AMIODARONE HCL IN DEXTROSE 360-4.14 MG/200ML-% IV SOLN: 60.0000 mg/h | INTRAVENOUS | Status: DC

## 2021-12-09 MED ORDER — KETAMINE HCL 50 MG/5ML IJ SOSY
PREFILLED_SYRINGE | INTRAMUSCULAR | Status: AC
  Filled 2021-12-09: qty 5

## 2021-12-09 MED ORDER — MIDAZOLAM HCL 2 MG/2ML IJ SOLN
INTRAMUSCULAR | Status: AC
  Filled 2021-12-09: qty 2

## 2021-12-09 MED ORDER — SUCCINYLCHOLINE CHLORIDE 200 MG/10ML IV SOSY
PREFILLED_SYRINGE | INTRAVENOUS | Status: AC
  Filled 2021-12-09: qty 10

## 2021-12-09 MED ORDER — FENTANYL CITRATE PF 50 MCG/ML IJ SOSY
PREFILLED_SYRINGE | INTRAMUSCULAR | Status: AC
  Filled 2021-12-09: qty 2

## 2021-12-09 MED ORDER — HEPARIN (PORCINE) 25000 UT/250ML-% IV SOLN: 2300.0000 [IU]/h | INTRAVENOUS | Status: DC
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
## 2021-12-09 NOTE — Progress Notes (Signed)
Peripherally Inserted Central Catheter Placement  The IV Nurse has discussed with the patient and/or persons authorized to consent for the patient, the purpose of this procedure and the potential benefits and risks involved with this procedure.  The benefits include less needle sticks, lab draws from the catheter, and the patient may be discharged home with the catheter. Risks include, but not limited to, infection, bleeding, blood clot (thrombus formation), and puncture of an artery; nerve damage and irregular heartbeat and possibility to perform a PICC exchange if needed/ordered by physician.  Alternatives to this procedure were also discussed.  Bard Power PICC patient education guide, fact sheet on infection prevention and patient information card has been provided to patient /or left at bedside.    PICC Placement Documentation  PICC Triple Lumen 12/09/21 PICC Right Brachial 43 cm 0 cm (Active)  Indication for Insertion or Continuance of Line Vasoactive infusions 12/09/21 1108  Exposed Catheter (cm) 0 cm 12/09/21 1108  Site Assessment Clean;Dry;Intact 12/09/21 1108  Lumen #1 Status Flushed;Blood return noted;Saline locked 12/09/21 1108  Lumen #2 Status Flushed;Blood return noted;Saline locked 12/09/21 1108  Lumen #3 Status Flushed;Blood return noted;Saline locked 12/09/21 1108  Dressing Type Transparent 12/09/21 1108  Dressing Status Clean;Dry;Intact 12/09/21 1108  Antimicrobial disc in place? Yes 12/09/21 1108  Dressing Change Due 12/16/21 12/09/21 1108       Adisen Bennion Ramos 12/09/2021, 11:11 AM

## 2021-12-09 NOTE — Progress Notes (Signed)
Report called to [facility] on [place] RN accepted report. Head to assessment given via phone. IV infusions reviewed and noted. Pt on 15 L  highflow. Transport with carelink organized and EMTALA paperwork completed.

## 2021-12-09 NOTE — Progress Notes (Signed)
° °  PM note     Pt now on milrinone, amiodarone IV and heparin HR 90s   SBP 90s/    Initally on BiPAP   Now on High flow 15 L [place] Comfortable in bed He has diuresed about 3 L today with lasix and milrinone  Plan to tx to [facility] today    Signed, Dorris Carnes, MD  12/09/2021, 4:18 PM

## 2021-12-09 NOTE — Plan of Care (Signed)
°  Problem: Health Behavior/Discharge Planning: Goal: Ability to manage health-related needs will improve Outcome: Progressing   Problem: Clinical Measurements: Goal: Ability to maintain clinical measurements within normal limits will improve Outcome: Progressing Goal: Will remain free from infection Outcome: Progressing Goal: Diagnostic test results will improve Outcome: Progressing Goal: Respiratory complications will improve Outcome: Progressing Goal: Cardiovascular complication will be avoided Outcome: Progressing   Problem: Elimination: Goal: Will not experience complications related to bowel motility Outcome: Progressing Goal: Will not experience complications related to urinary retention Outcome: Progressing   Problem: Education: Goal: Knowledge of disease or condition will improve Outcome: Progressing Goal: Understanding of medication regimen will improve Outcome: Progressing Goal: Individualized Educational Video(s) Outcome: Progressing   Problem: Activity: Goal: Ability to tolerate increased activity will improve Outcome: Progressing   Problem: Cardiac: Goal: Ability to achieve and maintain adequate cardiopulmonary perfusion will improve Outcome: Progressing   Problem: Health Behavior/Discharge Planning: Goal: Ability to safely manage health-related needs after discharge will improve Outcome: Progressing

## 2021-12-09 NOTE — Progress Notes (Signed)
ANTICOAGULATION CONSULT NOTE - Follow Up Consult  Pharmacy Consult for Heparin Indication: atrial fibrillation  Allergies  Allergen Reactions   Peanut-Containing Drug Products Nausea And Vomiting    Pt reports tolerance to small amount of peanut now    Patient Measurements: Height: 5\' 9"  (175.3 cm) Weight: 127.2 kg (280 lb 6.8 oz) IBW/kg (Calculated) : 70.7 Heparin Dosing Weight:  101.2 kg  Vital Signs: Temp: 98.8 F (37.1 C) (01/20 0814) Temp Source: Axillary (01/20 0814) BP: 100/56 (01/20 1300) Pulse Rate: 96 (01/20 1340)  Labs: Recent Labs    12/07/21 0313 12/07/21 1455 12/08/21 0128 12/08/21 1613 12/08/21 1925 12/09/21 0127  HGB 16.3  --  15.7  --   --  14.5  HCT 49.4  --  47.5  --   --  45.2  PLT 216  --  248  --   --  236  APTT 68* 97* 85*  --   --   --   HEPARINUNFRC 0.53 0.81* 0.61  --   --  0.38  CREATININE 1.63*  --  1.62* 2.03* 2.02* 1.75*     Estimated Creatinine Clearance: 65.2 mL/min (A) (by C-G formula based on SCr of 1.75 mg/dL (H)).  Assessment: 51 yo male on apixaban PTA for AF (LD 1/14 AM). Pharmacy dosing heparin in case of procedures. He is noted with severe MR.  -CBC WNL and stable -heparin level therapeutic at 0.38 -hematuria noted by RN but resolved yesterday  Goal of Therapy:  Heparin level= 0.3-0.7 Monitor platelets by anticoagulation protocol: Yes   Plan:  Cont heparin at 2300 units/hr  Daily heparin level/CBC    Varney Daily, PharmD PGY1 Pharmacy Resident  Please check AMION for all [facility] pharmacy phone numbers After 10:00 PM call main pharmacy [phone]

## 2021-12-09 NOTE — Progress Notes (Signed)
Pt being transferred to [facility].Belongings, including glasses and cell phone transferred with pt. Cards fellow at bedside for EMTALA paperwork update. VS documented.  Bedside handoff with Altamese Dilling, paramedic.   Report called on dayshift to [place]. Called and gave update to receiving RN for [place], RN,

## 2021-12-09 NOTE — Progress Notes (Addendum)
Progress Note  Patient Name: Anthony Byrd Date of Encounter: 12/09/2021  Primary Cardiologist: [name], MD   Subjective   Pt on BiPAP     Inpatient Medications    Scheduled Meds:  chlorhexidine  15 mL Mouth Rinse BID   Chlorhexidine Gluconate Cloth  6 each Topical Daily   diltiazem  360 mg Oral Daily   mouth rinse  15 mL Mouth Rinse q12n4p   metoprolol tartrate  100 mg Oral BID   multivitamin with minerals  1 tablet Oral Daily   sodium chloride flush  3 mL Intravenous Q12H   sodium chloride flush  3 mL Intravenous Q12H   Continuous Infusions:  sodium chloride Stopped (12/05/21 1613)   amiodarone 30 mg/hr (12/09/21 0800)   heparin 2,300 Units/hr (12/09/21 0800)   PRN Meds: sodium chloride, acetaminophen **OR** acetaminophen, benzonatate, guaiFENesin-dextromethorphan, polyethylene glycol, sodium chloride, sodium chloride flush   Vital Signs    Vitals:   12/09/21 0700 12/09/21 0730 12/09/21 0800 12/09/21 0814  BP: (!) 106/56 100/74 104/64   Pulse: 87 88 85   Resp: 20 (!) 23 20   Temp:    98.8 F (37.1 C)  TempSrc:    Axillary  SpO2: 94% 95% 94%   Weight:      Height:        Intake/Output Summary (Last 24 hours) at 12/09/2021 0919 Last data filed at 12/09/2021 0800 Gross per 24 hour  Intake 1406.08 ml  Output 2325 ml  Net -918.92 ml   Net net 5.6 L total   Filed Weights   12/07/21 0926 12/08/21 0400 12/09/21 0500  Weight: 131 kg 132.9 kg 127.2 kg    Telemetry    Afib  80s to 90s     - Personally Reviewed  ECG    No new      Physical Exam   Gen:  Pt sitting up in bed on bipap Neck: Full Cardiac   Irreg irreg  No S3   Distant  heart sounds   Respiratory: Relatively clear  GI: Obese   Non tender   MS: 1-2+   LE edema  Integument: Chronic skin changes legs   Neuro:  A and O x 3     Labs    Chemistry Recent Labs  Lab 12/08/21 1613 12/08/21 1925 12/09/21 0127  NA 131* 131* 133*  K 5.7* 5.1 4.2  CL 94* 96* 96*  CO2 20* 24 27   GLUCOSE 162* 136* 107*  BUN 46* 46* 40*  CREATININE 2.03* 2.02* 1.75*  CALCIUM 9.2 8.6* 8.5*  GFRNONAA 39* 39* 46*  ANIONGAP 17* 11 10     Hematology Recent Labs  Lab 12/07/21 0313 12/08/21 0128 12/09/21 0127  WBC 10.6* 12.2* 13.2*  RBC 5.68 5.40 5.13  HGB 16.3 15.7 14.5  HCT 49.4 47.5 45.2  MCV 87.0 88.0 88.1  MCH 28.7 29.1 28.3  MCHC 33.0 33.1 32.1  RDW 15.6* 15.7* 15.7*  PLT 216 248 236    Cardiac EnzymesNo results for input(s): TROPONINI in the last 168 hours. No results for input(s): TROPIPOC in the last 168 hours.   BNP No results for input(s): BNP, PROBNP in the last 168 hours.    DDimer No results for input(s): DDIMER in the last 168 hours.   Radiology    DG CHEST PORT 1 VIEW  Result Date: 12/08/2021 CLINICAL DATA:  Shortness of breath EXAM: PORTABLE CHEST 1 VIEW COMPARISON:  Film from earlier in the same day. FINDINGS:  Cardiac shadow remains enlarged. Diffuse bilateral pulmonary edema is again identified similar to that seen on the prior exam. No effusion or pneumothorax is seen. No bony abnormality is noted. IMPRESSION: Stable diffuse pulmonary edema. Electronically Signed   By: Inez Catalina M.D.   On: 12/08/2021 20:09   DG CHEST PORT 1 VIEW  Result Date: 12/08/2021 CLINICAL DATA:  Reason for exam: acute respiratory failure with hypoxia Patient reports worsening sob since cardioversion yesterday. Hx of afib, multiple prior cardioversions. EXAM: PORTABLE CHEST - 1 VIEW COMPARISON:  the previous day's study FINDINGS: Worsening of bilateral diffuse infiltrates or edema with relative peripheral sparing in the the upper lobes. Heart size upper limits normal. Blunting of left lateral costophrenic angle suggesting small effusion. Visualized bones unremarkable. IMPRESSION: Worsening bilateral edema or infiltrates. Electronically Signed   By: Lucrezia Europe M.D.   On: 12/08/2021 08:02   DG CHEST PORT 1 VIEW  Result Date: 12/07/2021 CLINICAL DATA:  Shortness of breath EXAM:  PORTABLE CHEST 1 VIEW COMPARISON:  Chest x-ray dated December 01, 2021 FINDINGS: Unchanged enlarged cardiac contours. Bilateral interstitial opacities. Small left pleural effusion. No evidence of pneumothorax. IMPRESSION: 1. Similar to slightly increased bilateral interstitial opacities which are likely due to pulmonary edema. 2. Unchanged small left pleural effusion. Electronically Signed   By: Yetta Glassman M.D.   On: 12/07/2021 10:26   ECHO TEE  Result Date: 12/07/2021    TRANSESOPHOGEAL ECHO REPORT   Patient Name:   Anthony Byrd Date of Exam: 12/07/2021 Medical Rec #:  [id number]      Height:       69.0 in Accession #:    [id number]     Weight:       290.5 lb Date of Birth:  07-14-1971      BSA:          2.421 m Patient Age:    51 years       BP:           130/82 mmHg Patient Gender: M              HR:           107 bpm. Exam Location:  Inpatient Procedure: Transesophageal Echo, Limited Color Doppler, Cardiac Doppler and            Intracardiac Opacification Agent Indications:     mitral regurgitation  History:         Patient has prior history of Echocardiogram examinations, most                  recent 12/02/2021. CHF, Arrythmias:Atrial Fibrillation; Risk                  Factors:Sleep Apnea.  Sonographer:     Johny Chess RDCS Referring Phys:  [id number] Nikiah Goin V Lofton Leon Diagnosing Phys: Fransico Him MD PROCEDURE: After discussion of the risks and benefits of a TEE, an informed consent was obtained from the patient. The transesophogeal probe was passed without difficulty through the esophogus of the patient. Imaged were obtained with the patient in a left lateral decubitus position. Sedation performed by different physician. The patient was monitored while under deep sedation. Anesthestetic sedation was provided intravenously by Anesthesiology: 746mg  of Propofol. The patient's vital signs; including heart rate, blood pressure, and oxygen saturation; remained stable throughout the procedure. The patient  developed no complications during the procedure. A direct current cardioversion was performed. IMPRESSIONS  1. Left ventricular ejection fraction, by estimation, is 50  to 55%. The left ventricle has low normal function. The left ventricle demonstrates global hypokinesis.  2. Right ventricular systolic function is mildly reduced. The right ventricular size is mildly enlarged.  3. There is a nonmobile density in the mouth of the LA appendage. The LAA completely opacifies with definity contrast with no echolucent area corresponding to the area where the density was noted. This is consistent with artifact and not thrombus. . Left atrial size was severely dilated. No left atrial/left atrial appendage thrombus was detected. The LAA emptying velocity was 60 cm/s.  4. Right atrial size was mildly dilated.  5. Degenerative MV with moderately thickened and midly calcified leaflets mainly at the leaflet base and moderate mitral annular calcification. 3D images were obtained. There is possible mild late systolic prolapse of the A2 segment. There is severe mitral regurgitation eccentrically directed towards the posterior wall with flow into the left upper pulmonary vein. There is intermittent flow reversal in the LUPV consistent with severe MR. Suspect MR is related to annular dilatation as well as possible A2 prolapse.  6. The aortic valve is calcified. There is moderate calcification of the aortic valve. Aortic valve regurgitation is not visualized. Moderate aortic valve stenosis. Planimetered AVA was 1.19cm2.  7. The inferior vena cava is normal in size with greater than 50% respiratory variability, suggesting right atrial pressure of 3 mmHg. Conclusion(s)/Recommendation(s):Low Normal LVF with EF 50-55%, no evidence of LA or LAA thrombus by definity contrast study, severe MR with evidence of intermittent flow reversal in ileft upper pulmonary vein, Moderate aortic stenosis. FINDINGS  Left Ventricle: Left ventricular ejection  fraction, by estimation, is 50 to 55%. The left ventricle has low normal function. The left ventricle demonstrates global hypokinesis. Definity contrast agent was given IV to delineate the left ventricular endocardial borders. The left ventricular internal cavity size was normal in size. There is no left ventricular hypertrophy. Right Ventricle: The right ventricular size is mildly enlarged. No increase in right ventricular wall thickness. Right ventricular systolic function is mildly reduced. Left Atrium: There is a nonmobile density in the mouth of the LA appendage. The LAA completely opacifies with definity contrast with no echolucent area corresponding to the area where the density was noted. This is consistent with artifact and not thrombus. Left atrial size was severely dilated. No left atrial/left atrial appendage thrombus was detected. The LAA emptying velocity was 60 cm/s. Right Atrium: Right atrial size was mildly dilated. Pericardium: There is no evidence of pericardial effusion. Mitral Valve: Degenerative MV with moderately thickened and midly calcified leaflets mainly at the leaflet base and moderate mitral annular calcification. 3D images were obtained. There is possible mild prolapse of the A2 segment. There is severe mitral regurgitation eccentrically directed towards the posterior wall with flow into the left upper pulmonary vein. There is intermittent flow reversal in the LUPV consistent with severe MR. Suspect MR is related to annular dilatation as well as possible A2 prolapse. The mitral valve is degenerative in appearance. There is mild late systolic prolapse of the middle segment of the anterior leaflet of the mitral valve. Severe mitral valve regurgitation, with eccentric posteriorly directed jet. No evidence of mitral valve stenosis. Tricuspid Valve: The tricuspid valve is normal in structure. Tricuspid valve regurgitation is trivial. No evidence of tricuspid stenosis. Aortic Valve:  Planimetered AVA was 1.19cm2. The aortic valve is calcified. There is moderate calcification of the aortic valve. Aortic valve regurgitation is not visualized. Moderate aortic stenosis is present. Pulmonic Valve: The pulmonic valve  was normal in structure. Pulmonic valve regurgitation is not visualized. No evidence of pulmonic stenosis. Aorta: The aortic root is normal in size and structure. Venous: The inferior vena cava is normal in size with greater than 50% respiratory variability, suggesting right atrial pressure of 3 mmHg. IAS/Shunts: No atrial level shunt detected by color flow Doppler. Fransico Him MD Electronically signed by Fransico Him MD Signature Date/Time: 12/07/2021/7:58:24 PM    Final      Cardiac studies     TEE    12/07/21  1. Left ventricular ejection fraction, by estimation, is 50 to 55%. The  left ventricle has low normal function. The left ventricle demonstrates  global hypokinesis.   2. Right ventricular systolic function is mildly reduced. The right  ventricular size is mildly enlarged.   3. There is a nonmobile density in the mouth of the LA appendage. The LAA  completely opacifies with definity contrast with no echolucent area  corresponding to the area where the density was noted. This is consistent  with artifact and not thrombus. .  Left atrial size was severely dilated. No left atrial/left atrial  appendage thrombus was detected. The LAA emptying velocity was 60 cm/s.   4. Right atrial size was mildly dilated.   5. Degenerative MV with moderately thickened and midly calcified leaflets  mainly at the leaflet base and moderate mitral annular calcification. 3D  images were obtained. There is possible mild late systolic prolapse of the  A2 segment. There is severe  mitral regurgitation eccentrically directed towards the posterior wall  with flow into the left upper pulmonary vein. There is intermittent flow  reversal in the LUPV consistent with severe MR. Suspect MR is  related to  annular dilatation as well as  possible A2 prolapse.    6. The aortic valve is calcified. There is moderate calcification of the  aortic valve. Aortic valve regurgitation is not visualized. Moderate  aortic valve stenosis. Planimetered AVA was 1.19cm2.   7. The inferior vena cava is normal in size with greater than 50%  respiratory variability, suggesting right atrial pressure of 3 mmHg.   Conclusion(s)/Recommendation(s):Low Normal LVF with EF 50-55%, no evidence  of LA or LAA thrombus by definity contrast study, severe MR with evidence  of intermittent flow reversal in ileft upper pulmonary vein, Moderate  aortic stenosis.  Cardiac Cath  12/05/21\    Hemodynamic findings consistent with severe pulmonary hypertension.   No angiographic evidence of CAD Elevated right and left heart pressures. RA 17, RV 52/12/18, PA 61/29 mean 47, PCWP 33, LV 116/15/24, AO 111/85)   Continue workup for mitral regurgitation and possible valve surgery. TEE planned later this week to assess valve and for DCCV. Continue diuresis as renal function allows.   Echo 12/02/21 Left ventricular ejection fraction, by estimation, is 50 to 55%. The left ventricle has low normal function. The left ventricle has no regional wall motion abnormalities. There is moderate concentric left ventricular hypertrophy. Diastolic function indeterminant due to Afib. 1. Right ventricular systolic function is mildly reduced. The right ventricular size is mildly enlarged. Tricuspid regurgitation signal is inadequate for assessing PA pressure. 2. 3. Left atrial size was severely dilated. 4. Right atrial size was mildly dilated. The mitral valve is moderately thickened and calcified with moderate-to-severe mitral annular calcification. Prior TTE in 2021 with A2/P2 prolapse which is difficult to appreciate on current study due to degree of leaflet calcification. There is central mitral regurgitation that is appears  moderate vs moderate-to-severe, although, is  underappreciated on color doppler 5. The aortic valve was not well visualized. There is moderate calcification of the aortic valve. There is moderate thickening of the aortic valve. Aortic valve regurgitation is trivial. Mild aortic valve stenosis. 6. The inferior vena cava is dilated in size with <50% respiratory variability, suggesting right atrial pressure of 15 mmHg.  Patient Profile     51 y.o. male CKD but with notable mitral and aortic valve calcifications, lost to folow up with AF RVR and HF symptoms  Assessment & Plan    HFpEF Pt admitted on 12/01/20 with SOB  He had been out of meds    Had progressive worsening of breathing   In ED was hypotensive and in afib wit hRVR   Echo LVEF 50 to 55%  Underwent R and L heart cath 1/16 (see above)  No signficant CAD    LVEDP 24 PCWP  33  Since Wed he has declined.   Now on BiPAP    WIll start milrinone    COntinue attempts at diuresis     Stop diltiazem  CD   Will give dilt IV as HR requirese   Stop metoprolol given milrinone     Mitral Regurgitation  TEE with severe MR that appears secondary to annular dilitation   There may be some mild anterior prolapse of A2. Reviewed with interventional service    Will contact [facility] and  [facility] for tx for MV repair   Since Wed, the pt's overall status has declined He is now on BiPAP   WIll start milrinone as noted above and continue IV Lasix     Atrial fibrillation   Pt with persistent atrial fibrillation in past  Last cardioversion was in September 2021  He did not come back for follow up until now   Presented on 1/12 in afib with RVR   had been off of Xarelto (couldn't afford)  TEE negative for thrombus    Cardioversion attempted x 3   Unsuccessful.     Currently on IV heparin, diltiazem 360, metoprolol  100 bid   and IV amiodarone   HR has improved with IV amiodarone   INcrease to 60 mg / hour  HOld diltiazem and metoprolol   Dose diltiazem  with IV as HR needs     Renal   Cr 1.75 this am (2 yesterday)  Continue lasix     OSA  Hx OSA   Pt uses CPAP at home           Signed, Dorris Carnes, MD  12/09/2021, 9:19 AM

## 2021-12-09 NOTE — Discharge Summary (Signed)
Discharge Summary    Patient ID: RONAV FURNEY MRN: [id number]; DOB: 07-28-1971  Admit date: 12/01/2021 Discharge date: 12/09/2021  PCP:  Timothy Lasso, MD   [place] Prov[facility] Cardiologist:  [name], MD   Discharge Diagnoses    Principal Problem:   AKI (acute kidney injury) [facility]) Active Problems:   Atrial fibrillation ([place])   DOE (dyspnea on exertion)   Atrial fibrillation with RVR ([place])   Acute diastolic CHF (congestive heart failure), NYHA class 4 (HCC)   Severe mitral regurgitation   Nonrheumatic mitral valve regurgitation   Diagnostic Studies/Procedures    Right and left heart catheterization 12/05/21:   Hemodynamic findings consistent with severe pulmonary hypertension.   No angiographic evidence of CAD Elevated right and left heart pressures. RA 17, RV 52/12/18, PA 61/29 mean 47, PCWP 33, LV 116/15/24, AO 111/85)   Continue workup for mitral regurgitation and possible valve surgery. TEE planned later this week to assess valve and for DCCV. Continue diuresis as renal function allows.  _____________   Echo 12/02/21: 1. Left ventricular ejection fraction, by estimation, is 50 to 55%. The  left ventricle has low normal function. The left ventricle has no regional  wall motion abnormalities. There is moderate concentric left ventricular  hypertrophy. Diastolic function  indeterminant due to Afib.   2. Right ventricular systolic function is mildly reduced. The right  ventricular size is mildly enlarged. Tricuspid regurgitation signal is  inadequate for assessing PA pressure.   3. Left atrial size was severely dilated.   4. Right atrial size was mildly dilated.   5. The mitral valve is moderately thickened and calcified with  moderate-to-severe mitral annular calcification. Prior TTE in 2021 with  A2/P2 prolapse which is difficult to appreciate on current study due to  degree of leaflet calcification. There is  central mitral regurgitation that is  appears moderate vs  moderate-to-severe, although, is underappreciated on color doppler   6. The aortic valve was not well visualized. There is moderate  calcification of the aortic valve. There is moderate thickening of the  aortic valve. Aortic valve regurgitation is trivial. Mild aortic valve  stenosis.   7. The inferior vena cava is dilated in size with <50% respiratory  variability, suggesting right atrial pressure of 15 mmHg.   _____________   TEE-DCCV 12/07/21:  1. Left ventricular ejection fraction, by estimation, is 50 to 55%. The  left ventricle has low normal function. The left ventricle demonstrates  global hypokinesis.   2. Right ventricular systolic function is mildly reduced. The right  ventricular size is mildly enlarged.   3. There is a nonmobile density in the mouth of the LA appendage. The LAA  completely opacifies with definity contrast with no echolucent area  corresponding to the area where the density was noted. This is consistent  with artifact and not thrombus. .  Left atrial size was severely dilated. No left atrial/left atrial  appendage thrombus was detected. The LAA emptying velocity was 60 cm/s.   4. Right atrial size was mildly dilated.   5. Degenerative MV with moderately thickened and midly calcified leaflets  mainly at the leaflet base and moderate mitral annular calcification. 3D  images were obtained. There is possible mild late systolic prolapse of the  A2 segment. There is severe  mitral regurgitation eccentrically directed towards the posterior wall  with flow into the left upper pulmonary vein. There is intermittent flow  reversal in the LUPV consistent with severe MR. Suspect MR is related  to  annular dilatation as well as  possible A2 prolapse.   6. The aortic valve is calcified. There is moderate calcification of the  aortic valve. Aortic valve regurgitation is not visualized. Moderate  aortic valve stenosis. Planimetered AVA was 1.19cm2.    7. The inferior vena cava is normal in size with greater than 50%  respiratory variability, suggesting right atrial pressure of 3 mmHg.    History of Present Illness     Anthony Byrd is a 51 y.o. male with a PMH significant for perisstent atrial fibrillation s/p DCCV in 2021 and not on [place], HFpEF, OSA on CPAP, and obesity. He has known moderate to severe MR.  Pt stopped his xarelto due to cost. He presented to [facility] 12/01/21 with shortness of breath.   In September 2021 the patient was admitted to the hospital at which time he underwent a TEE/DCCV for his atrial fibrillation.  He also had a TEE which show moderate to severe mitral regurgitation.   He reports that over the last several months he has been experiencing intermittent shortness of breath on exertion (walking up stairs).   Eventually his shortness of breath got worse.  He also admitted that he had not been taking all of his medication as prescribed including Xarelto and his rate limiting agents as well.  He was seen recently at the internal medicine clinic his medications were restarted but he notes that since that time the shortness of breath actually started to get worse he then decided to come to the emergency department to be evaluated.  While in the emergency department he was hypotensive in atrial fibrillation, BNP minimally elevated but was also hypoxic as well.  Given his atrial fibrillation rapid ventricular rate cardiology has been consulted to help with the management of this patient.  Hospital Course      1 Mitral valve dz Pt presented with severe SOB   Volume overload   Known MR. Echo done on 12/02/21.  Poor acoustic windows   MR at least mod to moderate severe. TEE 12/07/21 showed severe MR due to annular dilatation as well as mild A2 prolapse. Mild aortic stenosis was also noted.  Reviewed findings with Dr George Hugh at [facility]  He has agree to accept pt in transfer for MV repair  2. Acute on chronic  diastolic CHF    The pt presented with volume overload, SOB   Given IV lasix   Limited response with some bumps in Cr.  Echo showed LVEF normal  Mod , moderate /severe MR  Lasix was given with some response    he underwent R and L heart catheterization on 12/06/21 .  This showed  obstructive CAD. He had elevated right and left heart pressures with RA 17, RV 52/12/18, PA 62/29 mean 47, PCWP 33, LV 116/15/24, AO 111/85.  After TEE and cardioversion attempts the patient's clinical course deteriorated.   He became more SOB requiring higher O2  He eventually was placed on BiPAP 100% FI O2    Milrinone started this am and Lasix 80 mg given with improvement in urine output and improved oxygenation   The pt put out over 3 L of urine.    3 Atrial fibriillation   The pt has a hx of PAF in past and has been cardioverted (2021)  When he presented to hospital he had been off of Xarelto and other meds. The pt was started on IV heparin on arrival.   Metoprolol and diltiazem  were restarted.  The patient was also placed on amiodarone (po initially)  Rates improved. TEE was done on 11/18  Neg for LAA thrombus Cardioversion x 3 were unsuccessful After cardioversion the pt was placed on IV amiodarone to improve load.   Today metoprolol and diltiazem were stopped given milrinone use and rates in 90s  . Patient would benefit from MAZE procedure    OSA on CPAP at baseline - biPAP has been weaned.            _____________  Discharge Vitals Blood pressure 103/62, pulse (!) 102, temperature 98.1 F (36.7 C), temperature source Oral, resp. rate (!) 21, height 5\' 9"  (1.753 m), weight 127.2 kg, SpO2 93 %.  Filed Weights   12/07/21 0926 12/08/21 0400 12/09/21 0500  Weight: 131 kg 132.9 kg 127.2 kg    Labs & Radiologic Studies    CBC Recent Labs    12/08/21 0128 12/09/21 0127  WBC 12.2* 13.2*  HGB 15.7 14.5  HCT 47.5 45.2  MCV 88.0 88.1  PLT 248 258   Basic Metabolic Panel Recent Labs    12/08/21 1925  12/09/21 0127  NA 131* 133*  K 5.1 4.2  CL 96* 96*  CO2 24 27  GLUCOSE 136* 107*  BUN 46* 40*  CREATININE 2.02* 1.75*  CALCIUM 8.6* 8.5*   Liver Function Tests No results for input(s): AST, ALT, ALKPHOS, BILITOT, PROT, ALBUMIN in the last 72 hours. No results for input(s): LIPASE, AMYLASE in the last 72 hours. High Sensitivity Troponin:   No results for input(s): TROPONINIHS in the last 720 hours.  BNP Invalid input(s): POCBNP D-Dimer No results for input(s): DDIMER in the last 72 hours. Hemoglobin A1C No results for input(s): HGBA1C in the last 72 hours. Fasting Lipid Panel No results for input(s): CHOL, HDL, LDLCALC, TRIG, CHOLHDL, LDLDIRECT in the last 72 hours. Thyroid Function Tests No results for input(s): TSH, T4TOTAL, T3FREE, THYROIDAB in the last 72 hours.  Invalid input(s): FREET3 _____________  DG Chest 2 View  Result Date: 12/01/2021 CLINICAL DATA:  Chest pain for 2 days. EXAM: CHEST - 2 VIEW COMPARISON:  07/09/2020 FINDINGS: Both views are mildly degraded by patient body habitus. Midline trachea. Mild cardiomegaly. Trace left pleural thickening blunts the costophrenic angle. No pneumothorax. Moderate pulmonary interstitial thickening. IMPRESSION: Given differences in technique, similar appearance of cardiomegaly and moderate pulmonary interstitial thickening. Favor mild interstitial edema. Electronically Signed   By: Abigail Miyamoto M.D.   On: 12/01/2021 10:51   CARDIAC CATHETERIZATION  Result Date: 12/05/2021   Hemodynamic findings consistent with severe pulmonary hypertension. No angiographic evidence of CAD Elevated right and left heart pressures. RA 17, RV 52/12/18, PA 61/29 mean 47, PCWP 33, LV 116/15/24, AO 111/85) Continue workup for mitral regurgitation and possible valve surgery. TEE planned later this week to assess valve and for DCCV. Continue diuresis as renal function allows.   US RENAL  Result Date: 12/09/2021 CLINICAL DATA:  51 year old male with  painless hematuria. EXAM: RENAL / URINARY TRACT ULTRASOUND COMPLETE COMPARISON:  Noncontrast CT Abdomen and Pelvis 07/04/2020. FINDINGS: Right Kidney: Renal measurements: 14.1 x 7.3 x 7.8 cm = volume: 416 mL. Chronic extrarenal pelvis. Chronic renal cortical thinning. Shadowing renal staghorn calculus suspected on image 4, with staghorn calculi extending to the proximal renal pelvis on the 2021 CT. Mild if any right hydronephrosis. Left Kidney: Renal measurements: 16.5 x 7.9 x 8.0 cm = volume: 547 mL. Chronic left renal cortical thinning. No hydronephrosis, nephrolithiasis or left renal mass  identified. Bladder: Diminutive. Appears normal for degree of bladder distention. Both ureteral jets identified with Doppler. Other: None. IMPRESSION: 1. Chronic Right Renal Staghorn Calculus. Mild if any associated right hydronephrosis at this time. 2. Underlying chronic renal cortical atrophy. 3. Unremarkable urinary bladder. Electronically Signed   By: Genevie Ann M.D.   On: 12/09/2021 11:55   US RENAL  Result Date: 12/02/2021 CLINICAL DATA:  Acute kidney injury. EXAM: RENAL / URINARY TRACT ULTRASOUND COMPLETE Difficult exam due to: Large size of patient. COMPARISON:  Renal ultrasound 08/29/2019, CT abdomen and pelvis no contrast 07/04/2020 FINDINGS: Right Kidney: Renal measurements: 16.1 x 7.6 x 6.2 cm = volume: 394.44 mL. Echogenicity within normal limits with mild cortical thinning again noted. No mass or hydronephrosis visualized. There are multiple scattered collecting system calculi. Largest is in the upper pole measuring up to 2.6 cm. Left Kidney: Renal measurements: 15.8 x 7.7 x 6.3 cm = volume: 398.93 mL. Echogenicity within normal limits but again with mild cortical thinning. No mass, stone or hydronephrosis visualized. Bladder: Appears normal for degree of bladder distention. Ureteral jets were not evaluated for. Other: no free fluid is seen. IMPRESSION: 1. Nonobstructing right intrarenal stones. 2. Mild cortical  thinning. 3. No increased cortical echogenicity or hydronephrosis. Electronically Signed   By: Telford Nab M.D.   On: 12/02/2021 00:51   DG CHEST PORT 1 VIEW  Result Date: 12/09/2021 CLINICAL DATA:  51 year old male PICC line placement. EXAM: PORTABLE CHEST 1 VIEW COMPARISON:  Portable chest 12/08/2021 and earlier. FINDINGS: Portable AP semi upright view at 1109 hours. New right upper extremity approach PICC line placed. Catheter tip at the cavoatrial junction level. Stable cardiac size and mediastinal contours. No pneumothorax. Stable lung volumes. Regressed but not resolved bilateral basilar predominant pulmonary edema. No areas of worsening ventilation. IMPRESSION: 1. Right upper extremity approach PICC line placed, tip at the cavoatrial junction level. 2. Regressed but not resolved pulmonary edema. 3. No new cardiopulmonary abnormality. Electronically Signed   By: Genevie Ann M.D.   On: 12/09/2021 11:30   DG CHEST PORT 1 VIEW  Result Date: 12/08/2021 CLINICAL DATA:  Shortness of breath EXAM: PORTABLE CHEST 1 VIEW COMPARISON:  Film from earlier in the same day. FINDINGS: Cardiac shadow remains enlarged. Diffuse bilateral pulmonary edema is again identified similar to that seen on the prior exam. No effusion or pneumothorax is seen. No bony abnormality is noted. IMPRESSION: Stable diffuse pulmonary edema. Electronically Signed   By: Inez Catalina M.D.   On: 12/08/2021 20:09   DG CHEST PORT 1 VIEW  Result Date: 12/08/2021 CLINICAL DATA:  Reason for exam: acute respiratory failure with hypoxia Patient reports worsening sob since cardioversion yesterday. Hx of afib, multiple prior cardioversions. EXAM: PORTABLE CHEST - 1 VIEW COMPARISON:  the previous day's study FINDINGS: Worsening of bilateral diffuse infiltrates or edema with relative peripheral sparing in the the upper lobes. Heart size upper limits normal. Blunting of left lateral costophrenic angle suggesting small effusion. Visualized bones  unremarkable. IMPRESSION: Worsening bilateral edema or infiltrates. Electronically Signed   By: Lucrezia Europe M.D.   On: 12/08/2021 08:02   DG CHEST PORT 1 VIEW  Result Date: 12/07/2021 CLINICAL DATA:  Shortness of breath EXAM: PORTABLE CHEST 1 VIEW COMPARISON:  Chest x-ray dated December 01, 2021 FINDINGS: Unchanged enlarged cardiac contours. Bilateral interstitial opacities. Small left pleural effusion. No evidence of pneumothorax. IMPRESSION: 1. Similar to slightly increased bilateral interstitial opacities which are likely due to pulmonary edema. 2. Unchanged small left pleural  effusion. Electronically Signed   By: Yetta Glassman M.D.   On: 12/07/2021 10:26   ECHOCARDIOGRAM COMPLETE  Result Date: 12/02/2021    ECHOCARDIOGRAM REPORT   Patient Name:   KAREE CHRISTOPHERSON Date of Exam: 12/02/2021 Medical Rec #:  [id number]      Height:       69.0 in Accession #:    [id number]     Weight:       295.0 lb Date of Birth:  [birth year]      BSA:          2.437 m Patient Age:    18 years       BP:           109/88 mmHg Patient Gender: M              HR:           108 bpm. Exam Location:  Inpatient Procedure: 2D Echo, Cardiac Doppler, Color Doppler and Intracardiac            Opacification Agent Indications:    CHF/DOE/MR  History:        Patient has prior history of Echocardiogram examinations, most                 recent 10/05/2021. CHF; Arrythmias:Atrial Fibrillation. MR / TR                 / AFIB W RVR.  Sonographer:    Beryle Beams Referring Phys: [id number] [place]  1. Left ventricular ejection fraction, by estimation, is 50 to 55%. The left ventricle has low normal function. The left ventricle has no regional wall motion abnormalities. There is moderate concentric left ventricular hypertrophy. Diastolic function indeterminant due to Afib.  2. Right ventricular systolic function is mildly reduced. The right ventricular size is mildly enlarged. Tricuspid regurgitation signal is inadequate for  assessing PA pressure.  3. Left atrial size was severely dilated.  4. Right atrial size was mildly dilated.  5. The mitral valve is moderately thickened and calcified with moderate-to-severe mitral annular calcification. Prior TTE in 2021 with A2/P2 prolapse which is difficult to appreciate on current study due to degree of leaflet calcification. There is central mitral regurgitation that is appears moderate vs moderate-to-severe, although, is underappreciated on color doppler  6. The aortic valve was not well visualized. There is moderate calcification of the aortic valve. There is moderate thickening of the aortic valve. Aortic valve regurgitation is trivial. Mild aortic valve stenosis.  7. The inferior vena cava is dilated in size with <50% respiratory variability, suggesting right atrial pressure of 15 mmHg. FINDINGS  Left Ventricle: Left ventricular ejection fraction, by estimation, is 50 to 55%. The left ventricle has low normal function. The left ventricle has no regional wall motion abnormalities. Definity contrast agent was given IV to delineate the left ventricular endocardial borders. The left ventricular internal cavity size was normal in size. There is moderate concentric left ventricular hypertrophy. Diastolic function indeterminant due to Afib. Right Ventricle: The right ventricular size is mildly enlarged. Right vetricular wall thickness was not well visualized. Right ventricular systolic function is mildly reduced. Tricuspid regurgitation signal is inadequate for assessing PA pressure. Left Atrium: Left atrial size was severely dilated. Right Atrium: Right atrial size was mildly dilated. Pericardium: There is no evidence of pericardial effusion. Mitral Valve: The mitral valve is moderately thickened and calcified with moderate-to-severe mitral annular calcification. Prior TTE in 2021 with A2/P2 prolapse which is difficult  to appreciate on current study due to degree of leaflet calcification. There  is central mitral regurgitation that appears moderate, although, it is underappreciated on color doppler. The mitral valve is abnormal. There is moderate thickening of the mitral valve leaflet(s). There is moderate calcification of the mitral valve  leaflet(s). Moderate to severe mitral annular calcification. MV peak gradient, 111.5 mmHg. The mean mitral valve gradient is 77.0 mmHg. Tricuspid Valve: The tricuspid valve is normal in structure. Tricuspid valve regurgitation is trivial. Aortic Valve: The aortic valve was not well visualized. There is moderate calcification of the aortic valve. There is moderate thickening of the aortic valve. Aortic valve regurgitation is trivial. Mild aortic stenosis is present. Aortic valve mean gradient measures 12.0 mmHg. Aortic valve peak gradient measures 22.8 mmHg. Aortic valve area, by VTI measures 1.59 cm. Pulmonic Valve: The pulmonic valve was not well visualized. Pulmonic valve regurgitation is trivial. Aorta: The aortic root is normal in size and structure. Venous: The inferior vena cava is dilated in size with less than 50% respiratory variability, suggesting right atrial pressure of 15 mmHg. IAS/Shunts: The atrial septum is grossly normal.  LEFT VENTRICLE PLAX 2D LVIDd:         3.70 cm LVIDs:         2.80 cm LV PW:         1.60 cm LV IVS:        1.20 cm LVOT diam:     2.00 cm LV SV:         54 LV SV Index:   22 LVOT Area:     3.14 cm  LV Volumes (MOD) LV vol d, MOD A2C: 108.0 ml LV vol d, MOD A4C: 113.0 ml LV vol s, MOD A2C: 70.9 ml LV vol s, MOD A4C: 69.3 ml LV SV MOD A2C:     37.1 ml LV SV MOD A4C:     113.0 ml LV SV MOD BP:      40.8 ml RIGHT VENTRICLE             IVC RV S prime:     13.40 cm/s  IVC diam: 3.10 cm TAPSE (M-mode): 1.8 cm LEFT ATRIUM             Index        RIGHT ATRIUM           Index LA diam:        5.60 cm 2.30 cm/m   RA Area:     17.00 cm LA Vol (A2C):   80.5 ml 33.03 ml/m  RA Volume:   45.10 ml  18.51 ml/m LA Vol (A4C):   65.2 ml 26.75 ml/m  LA Biplane Vol: 73.4 ml 30.12 ml/m  AORTIC VALVE                     PULMONIC VALVE AV Area (Vmax):    1.45 cm      PV Vmax:       0.31 m/s AV Area (Vmean):   1.40 cm      PV Peak grad:  0.4 mmHg AV Area (VTI):     1.59 cm AV Vmax:           239.00 cm/s AV Vmean:          161.000 cm/s AV VTI:            0.342 m AV Peak Grad:      22.8 mmHg AV Mean Grad:  12.0 mmHg LVOT Vmax:         110.00 cm/s LVOT Vmean:        71.600 cm/s LVOT VTI:          0.173 m LVOT/AV VTI ratio: 0.51  AORTA Ao Root diam: 2.30 cm Ao Asc diam:  2.70 cm MITRAL VALVE              TRICUSPID VALVE MV Area VTI:  0.43 cm    TV Peak grad:   32.9 mmHg MV Peak grad: 111.5 mmHg  TV Mean grad:   25.0 mmHg MV Mean grad: 77.0 mmHg   TV Vmax:        2.87 m/s MV Vmax:      5.28 m/s    TV Vmean:       238.0 cm/s MV Vmean:     420.0 cm/s  TV VTI:         0.75 msec                            SHUNTS                           Systemic VTI:  0.17 m                           Systemic Diam: 2.00 cm Gwyndolyn Kaufman MD Electronically signed by Gwyndolyn Kaufman MD Signature Date/Time: 12/02/2021/12:15:15 PM    Final    ECHO TEE  Result Date: 12/07/2021    TRANSESOPHOGEAL ECHO REPORT   Patient Name:   JACUB WAITERS Date of Exam: 12/07/2021 Medical Rec #:  [id number]      Height:       69.0 in Accession #:    [id number]     Weight:       290.5 lb Date of Birth:  03-14-1971      BSA:          2.421 m Patient Age:    1 years       BP:           130/82 mmHg Patient Gender: M              HR:           107 bpm. Exam Location:  Inpatient Procedure: Transesophageal Echo, Limited Color Doppler, Cardiac Doppler and            Intracardiac Opacification Agent Indications:     mitral regurgitation  History:         Patient has prior history of Echocardiogram examinations, most                  recent 12/02/2021. CHF, Arrythmias:Atrial Fibrillation; Risk                  Factors:Sleep Apnea.  Sonographer:     Johny Chess RDCS Referring Phys:  [id number] Khayman Kirsch [place]  Diagnosing Phys: Fransico Him MD PROCEDURE: After discussion of the risks and benefits of a TEE, an informed consent was obtained from the patient. The transesophogeal probe was passed without difficulty through the esophogus of the patient. Imaged were obtained with the patient in a left lateral decubitus position. Sedation performed by different physician. The patient was monitored while under deep sedation. Anesthestetic sedation was provided intravenously by Anesthesiology: 746mg  of Propofol. The patient's vital signs; including heart  rate, blood pressure, and oxygen saturation; remained stable throughout the procedure. The patient developed no complications during the procedure. A direct current cardioversion was performed. IMPRESSIONS  1. Left ventricular ejection fraction, by estimation, is 50 to 55%. The left ventricle has low normal function. The left ventricle demonstrates global hypokinesis.  2. Right ventricular systolic function is mildly reduced. The right ventricular size is mildly enlarged.  3. There is a nonmobile density in the mouth of the LA appendage. The LAA completely opacifies with definity contrast with no echolucent area corresponding to the area where the density was noted. This is consistent with artifact and not thrombus. . Left atrial size was severely dilated. No left atrial/left atrial appendage thrombus was detected. The LAA emptying velocity was 60 cm/s.  4. Right atrial size was mildly dilated.  5. Degenerative MV with moderately thickened and midly calcified leaflets mainly at the leaflet base and moderate mitral annular calcification. 3D images were obtained. There is possible mild late systolic prolapse of the A2 segment. There is severe mitral regurgitation eccentrically directed towards the posterior wall with flow into the left upper pulmonary vein. There is intermittent flow reversal in the LUPV consistent with severe MR. Suspect MR is related to annular dilatation as well  as possible A2 prolapse.  6. The aortic valve is calcified. There is moderate calcification of the aortic valve. Aortic valve regurgitation is not visualized. Moderate aortic valve stenosis. Planimetered AVA was 1.19cm2.  7. The inferior vena cava is normal in size with greater than 50% respiratory variability, suggesting right atrial pressure of 3 mmHg. Conclusion(s)/Recommendation(s):Low Normal LVF with EF 50-55%, no evidence of LA or LAA thrombus by definity contrast study, severe MR with evidence of intermittent flow reversal in ileft upper pulmonary vein, Moderate aortic stenosis. FINDINGS  Left Ventricle: Left ventricular ejection fraction, by estimation, is 50 to 55%. The left ventricle has low normal function. The left ventricle demonstrates global hypokinesis. Definity contrast agent was given IV to delineate the left ventricular endocardial borders. The left ventricular internal cavity size was normal in size. There is no left ventricular hypertrophy. Right Ventricle: The right ventricular size is mildly enlarged. No increase in right ventricular wall thickness. Right ventricular systolic function is mildly reduced. Left Atrium: There is a nonmobile density in the mouth of the LA appendage. The LAA completely opacifies with definity contrast with no echolucent area corresponding to the area where the density was noted. This is consistent with artifact and not thrombus. Left atrial size was severely dilated. No left atrial/left atrial appendage thrombus was detected. The LAA emptying velocity was 60 cm/s. Right Atrium: Right atrial size was mildly dilated. Pericardium: There is no evidence of pericardial effusion. Mitral Valve: Degenerative MV with moderately thickened and midly calcified leaflets mainly at the leaflet base and moderate mitral annular calcification. 3D images were obtained. There is possible mild prolapse of the A2 segment. There is severe mitral regurgitation eccentrically directed towards  the posterior wall with flow into the left upper pulmonary vein. There is intermittent flow reversal in the LUPV consistent with severe MR. Suspect MR is related to annular dilatation as well as possible A2 prolapse. The mitral valve is degenerative in appearance. There is mild late systolic prolapse of the middle segment of the anterior leaflet of the mitral valve. Severe mitral valve regurgitation, with eccentric posteriorly directed jet. No evidence of mitral valve stenosis. Tricuspid Valve: The tricuspid valve is normal in structure. Tricuspid valve regurgitation is trivial. No evidence of tricuspid  stenosis. Aortic Valve: Planimetered AVA was 1.19cm2. The aortic valve is calcified. There is moderate calcification of the aortic valve. Aortic valve regurgitation is not visualized. Moderate aortic stenosis is present. Pulmonic Valve: The pulmonic valve was normal in structure. Pulmonic valve regurgitation is not visualized. No evidence of pulmonic stenosis. Aorta: The aortic root is normal in size and structure. Venous: The inferior vena cava is normal in size with greater than 50% respiratory variability, suggesting right atrial pressure of 3 mmHg. IAS/Shunts: No atrial level shunt detected by color flow Doppler. Fransico Him MD Electronically signed by Fransico Him MD Signature Date/Time: 12/07/2021/7:58:24 PM    Final    [place] EKG SITE RITE  Result Date: 12/09/2021 If Site Rite image not attached, placement could not be confirmed due to current cardiac rhythm.  US Abdomen Limited RUQ (LIVER/GB)  Result Date: 12/04/2021 CLINICAL DATA:  Abdominal pain EXAM: ULTRASOUND ABDOMEN LIMITED RIGHT UPPER QUADRANT COMPARISON:  07/04/2020 FINDINGS: Gallbladder: Partially contracted. No pericholecystic fluid is noted. Negative sonographic Murphy's sign is elicited. Common bile duct: Diameter: 5 mm. Liver: Increased in echogenicity without focal mass. Portal vein is patent on color Doppler imaging with normal direction of  blood flow towards the liver. Other: None. IMPRESSION: Fatty infiltration of the liver. No acute abnormality is noted. Electronically Signed   By: Inez Catalina M.D.   On: 12/04/2021 19:27   Disposition  Transfer to [place] for surgery  Follow-up Plans & Appointments     Discharge Instructions     Diet - low sodium heart healthy   Complete by: As directed        Discharge Medications   Allergies as of 12/09/2021       Reactions   Peanut-containing Drug Products Nausea And Vomiting   Pt reports tolerance to small amount of peanut now        Medication List     STOP taking these medications    acetaminophen 500 MG tablet Commonly known as: TYLENOL   amiodarone 200 MG tablet Commonly known as: PACERONE   diltiazem 120 MG 24 hr capsule Commonly known as: CARDIZEM CD   furosemide 80 MG tablet Commonly known as: LASIX Replaced by: furosemide 10 MG/ML injection   guaiFENesin-Codeine 100-6.3 MG/5ML Soln   metoprolol tartrate 100 MG tablet Commonly known as: LOPRESSOR   potassium chloride SA 20 MEQ tablet Commonly known as: KLOR-CON M   rivaroxaban 20 MG Tabs tablet Commonly known as: XARELTO       TAKE these medications    amiodarone 360-4.14 MG/200ML-% Soln Commonly known as: NEXTERONE PREMIX Inject 60 mg/hr into the vein continuous.   furosemide 10 MG/ML injection Commonly known as: LASIX Inject 8 mLs (80 mg total) into the vein every 12 (twelve) hours. Replaces: furosemide 80 MG tablet   heparin 25000 UT/250ML infusion Inject 2,300 Units/hr into the vein continuous.   milrinone 20 MG/100 ML Soln infusion Commonly known as: PRIMACOR Inject 0.0159 mg/min into the vein continuous.           Outstanding Labs/Studies     Duration of Discharge Encounter   Greater than 30 minutes including physician time.  Signed, Tami Lin Duke, PA 12/09/2021, 4:45 PM

## 2021-12-12 ENCOUNTER — Other Ambulatory Visit (HOSPITAL_COMMUNITY): Payer: Self-pay

## 2021-12-20 NOTE — Progress Notes (Signed)
Internal Medicine Clinic Attending  I saw and evaluated the patient.  I personally confirmed the key portions of the history and exam documented by Dr. Elliot Gurney and I reviewed pertinent patient test results.  The assessment, diagnosis, and plan were formulated together and I agree with the documentation in the residents note. [name] options for more affordable anticoagulant.

## 2021-12-23 ENCOUNTER — Ambulatory Visit (HOSPITAL_COMMUNITY)
Admission: RE | Admit: 2021-12-23 | Discharge: 2021-12-23 | Disposition: A | Discharge status: Home / Self Care | Payer: Self-pay | Source: Ambulatory Visit | Attending: Internal Medicine | Admitting: Internal Medicine

## 2021-12-23 ENCOUNTER — Other Ambulatory Visit: Payer: Self-pay

## 2021-12-23 ENCOUNTER — Encounter (HOSPITAL_COMMUNITY): Payer: Self-pay | Admitting: Cardiology

## 2021-12-23 ENCOUNTER — Ambulatory Visit: Payer: Self-pay | Admitting: Internal Medicine

## 2021-12-23 ENCOUNTER — Encounter: Payer: Self-pay | Admitting: Internal Medicine

## 2021-12-23 ENCOUNTER — Emergency Department (HOSPITAL_COMMUNITY): Payer: Self-pay

## 2021-12-23 ENCOUNTER — Inpatient Hospital Stay (HOSPITAL_COMMUNITY)
Admission: EM | Admit: 2021-12-23 | Discharge: 2021-12-28 | DRG: 308 | Disposition: A | Discharge status: Home / Self Care | Payer: Self-pay | Attending: Internal Medicine | Admitting: Internal Medicine

## 2021-12-23 VITALS — BP 119/94 | HR 166 | Temp 99.7°F | Resp 28 | Ht 67.0 in | Wt 287.2 lb

## 2021-12-23 DIAGNOSIS — Z6841 Body Mass Index (BMI) 40.0 and over, adult: Secondary | ICD-10-CM

## 2021-12-23 DIAGNOSIS — I483 Typical atrial flutter: Secondary | ICD-10-CM | POA: Insufficient documentation

## 2021-12-23 DIAGNOSIS — Z952 Presence of prosthetic heart valve: Secondary | ICD-10-CM

## 2021-12-23 DIAGNOSIS — R059 Cough, unspecified: Secondary | ICD-10-CM | POA: Diagnosis present

## 2021-12-23 DIAGNOSIS — G4733 Obstructive sleep apnea (adult) (pediatric): Secondary | ICD-10-CM | POA: Diagnosis present

## 2021-12-23 DIAGNOSIS — Z7901 Long term (current) use of anticoagulants: Secondary | ICD-10-CM

## 2021-12-23 DIAGNOSIS — I4819 Other persistent atrial fibrillation: Secondary | ICD-10-CM | POA: Diagnosis present

## 2021-12-23 DIAGNOSIS — I4891 Unspecified atrial fibrillation: Principal | ICD-10-CM | POA: Diagnosis present

## 2021-12-23 DIAGNOSIS — E1122 Type 2 diabetes mellitus with diabetic chronic kidney disease: Secondary | ICD-10-CM | POA: Diagnosis present

## 2021-12-23 DIAGNOSIS — I130 Hypertensive heart and chronic kidney disease with heart failure and stage 1 through stage 4 chronic kidney disease, or unspecified chronic kidney disease: Secondary | ICD-10-CM | POA: Diagnosis present

## 2021-12-23 DIAGNOSIS — I484 Atypical atrial flutter: Principal | ICD-10-CM | POA: Diagnosis present

## 2021-12-23 DIAGNOSIS — R000 Tachycardia, unspecified: Secondary | ICD-10-CM | POA: Insufficient documentation

## 2021-12-23 DIAGNOSIS — Z91010 Allergy to peanuts: Secondary | ICD-10-CM

## 2021-12-23 DIAGNOSIS — I4892 Unspecified atrial flutter: Secondary | ICD-10-CM | POA: Diagnosis present

## 2021-12-23 DIAGNOSIS — N182 Chronic kidney disease, stage 2 (mild): Secondary | ICD-10-CM

## 2021-12-23 DIAGNOSIS — Z79899 Other long term (current) drug therapy: Secondary | ICD-10-CM

## 2021-12-23 DIAGNOSIS — Z8547 Personal history of malignant neoplasm of testis: Secondary | ICD-10-CM

## 2021-12-23 DIAGNOSIS — I5033 Acute on chronic diastolic (congestive) heart failure: Secondary | ICD-10-CM | POA: Diagnosis present

## 2021-12-23 DIAGNOSIS — Z20822 Contact with and (suspected) exposure to covid-19: Secondary | ICD-10-CM | POA: Diagnosis present

## 2021-12-23 DIAGNOSIS — I340 Nonrheumatic mitral (valve) insufficiency: Secondary | ICD-10-CM | POA: Diagnosis present

## 2021-12-23 HISTORY — DX: Other specified postprocedural states: Z98.890

## 2021-12-23 HISTORY — DX: Unspecified atrial flutter: I48.92

## 2021-12-23 LAB — BASIC METABOLIC PANEL
Anion gap: 10 (ref 5–15)
BUN: 13 mg/dL (ref 6–20)
CO2: 25 mmol/L (ref 22–32)
Calcium: 8.3 mg/dL — ABNORMAL LOW (ref 8.9–10.3)
Chloride: 99 mmol/L (ref 98–111)
Creatinine, Ser: 1.23 mg/dL (ref 0.61–1.24)
GFR, Estimated: >60 mL/min (ref >60)
Glucose, Bld: 114 mg/dL — ABNORMAL HIGH (ref 70–99)
Potassium: 3.7 mmol/L (ref 3.5–5.1)
Sodium: 134 mmol/L — ABNORMAL LOW (ref 135–145)

## 2021-12-23 LAB — RESP PANEL BY RT-PCR (FLU A&B, COVID) ARPGX2
Influenza A by PCR: NEGATIVE
Influenza B by PCR: NEGATIVE
SARS Coronavirus 2 by RT PCR: NEGATIVE

## 2021-12-23 LAB — CBC
HCT: 40.3 % (ref 39.0–52.0)
Hemoglobin: 12.6 g/dL — ABNORMAL LOW (ref 13.0–17.0)
MCH: 27.8 pg (ref 26.0–34.0)
MCHC: 31.3 g/dL (ref 30.0–36.0)
MCV: 89 fL (ref 80.0–100.0)
Platelets: 363 10*3/uL (ref 150–400)
RBC: 4.53 MIL/uL (ref 4.22–5.81)
RDW: 15.4 % (ref 11.5–15.5)
WBC: 9.9 10*3/uL (ref 4.0–10.5)
nRBC: 0 % (ref 0.0–0.2)

## 2021-12-23 LAB — PROTIME-INR
INR: 2.7 — ABNORMAL HIGH (ref 0.8–1.2)
Prothrombin Time: 28.5 seconds — ABNORMAL HIGH (ref 11.4–15.2)

## 2021-12-23 LAB — MRSA NEXT GEN BY PCR, NASAL: MRSA by PCR Next Gen: NOT DETECTED

## 2021-12-23 LAB — BRAIN NATRIURETIC PEPTIDE: B Natriuretic Peptide: 320 pg/mL — ABNORMAL HIGH (ref 0.0–100.0)

## 2021-12-23 LAB — MAGNESIUM: Magnesium: 1.8 mg/dL (ref 1.7–2.4)

## 2021-12-23 MED ORDER — SODIUM CHLORIDE 0.9% FLUSH: 3.0000 mL | INTRAVENOUS | Status: DC | PRN

## 2021-12-23 MED ORDER — AMIODARONE LOAD VIA INFUSION
150.0000 mg | Freq: Once | INTRAVENOUS | Status: AC
  Administered 2021-12-23: 150 mg via INTRAVENOUS
  Filled 2021-12-23: qty 83.34

## 2021-12-23 MED ORDER — POLYETHYLENE GLYCOL 3350 17 G PO PACK
17.0000 g | PACK | Freq: Every day | ORAL | Status: DC | PRN
  Administered 2021-12-27: 17 g via ORAL
  Filled 2021-12-23: qty 1

## 2021-12-23 MED ORDER — HYDROMORPHONE HCL 2 MG PO TABS
2.0000 mg | ORAL_TABLET | Freq: Four times a day (QID) | ORAL | Status: DC | PRN
  Administered 2021-12-23 – 2021-12-24 (×2): 2 mg via ORAL
  Filled 2021-12-23 (×3): qty 1

## 2021-12-23 MED ORDER — FUROSEMIDE 10 MG/ML IJ SOLN
20.0000 mg | Freq: Once | INTRAMUSCULAR | Status: AC
  Administered 2021-12-23: 20 mg via INTRAVENOUS
  Filled 2021-12-23: qty 2

## 2021-12-23 MED ORDER — SODIUM CHLORIDE 0.9 % IV SOLN: 250.0000 mL | INTRAVENOUS | Status: DC | PRN

## 2021-12-23 MED ORDER — ACETAMINOPHEN 325 MG PO TABS
650.0000 mg | ORAL_TABLET | Freq: Four times a day (QID) | ORAL | Status: DC | PRN
  Administered 2021-12-23 – 2021-12-27 (×12): 650 mg via ORAL
  Filled 2021-12-23 (×13): qty 2

## 2021-12-23 MED ORDER — FUROSEMIDE 10 MG/ML IJ SOLN
40.0000 mg | Freq: Two times a day (BID) | INTRAMUSCULAR | Status: DC
  Administered 2021-12-23 – 2021-12-24 (×2): 40 mg via INTRAVENOUS
  Filled 2021-12-23 (×2): qty 4

## 2021-12-23 MED ORDER — METOPROLOL TARTRATE 5 MG/5ML IV SOLN
10.0000 mg | Freq: Once | INTRAVENOUS | Status: AC
  Administered 2021-12-23: 5 mg via INTRAVENOUS
  Filled 2021-12-23: qty 10

## 2021-12-23 MED ORDER — POTASSIUM CHLORIDE CRYS ER 20 MEQ PO TBCR
20.0000 meq | EXTENDED_RELEASE_TABLET | Freq: Two times a day (BID) | ORAL | Status: DC
  Administered 2021-12-23 – 2021-12-26 (×7): 20 meq via ORAL
  Filled 2021-12-23 (×7): qty 1

## 2021-12-23 MED ORDER — WARFARIN - PHARMACIST DOSING INPATIENT: Freq: Every day | Status: DC

## 2021-12-23 MED ORDER — WARFARIN SODIUM 7.5 MG PO TABS
7.5000 mg | ORAL_TABLET | Freq: Once | ORAL | Status: AC
  Administered 2021-12-23: 7.5 mg via ORAL
  Filled 2021-12-23: qty 1

## 2021-12-23 MED ORDER — AMIODARONE HCL IN DEXTROSE 360-4.14 MG/200ML-% IV SOLN
60.0000 mg/h | INTRAVENOUS | Status: AC
  Administered 2021-12-23 (×2): 60 mg/h via INTRAVENOUS
  Filled 2021-12-23: qty 200

## 2021-12-23 MED ORDER — SODIUM CHLORIDE 0.9% FLUSH
3.0000 mL | Freq: Two times a day (BID) | INTRAVENOUS | Status: DC
  Administered 2021-12-23 – 2021-12-27 (×6): 3 mL via INTRAVENOUS

## 2021-12-23 MED ORDER — DILTIAZEM HCL 25 MG/5ML IV SOLN: 10.0000 mg | Freq: Once | INTRAVENOUS | Status: DC

## 2021-12-23 MED ORDER — METOPROLOL TARTRATE 25 MG PO TABS
25.0000 mg | ORAL_TABLET | Freq: Two times a day (BID) | ORAL | Status: DC
  Administered 2021-12-23 – 2021-12-28 (×10): 25 mg via ORAL
  Filled 2021-12-23 (×10): qty 1

## 2021-12-23 MED ORDER — ONDANSETRON HCL 4 MG/2ML IJ SOLN: 4.0000 mg | Freq: Four times a day (QID) | INTRAMUSCULAR | Status: DC | PRN

## 2021-12-23 MED ORDER — AMIODARONE HCL IN DEXTROSE 360-4.14 MG/200ML-% IV SOLN
30.0000 mg/h | INTRAVENOUS | Status: DC
  Administered 2021-12-23 – 2021-12-27 (×8): 30 mg/h via INTRAVENOUS
  Filled 2021-12-23 (×10): qty 200
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
## 2021-12-23 NOTE — Progress Notes (Signed)
Patient admitted to room from ED a bit after 1800. A&O x 4, no complaints of pain. Patient stated he was not aware of being in a flutter until he went to an outpatient appointment and it was incidentally caught. Lower extremity edema 3+, right and left.   While being admitted and sitting at edge of bed, very shortly after arriving to room, the patient did say he had a sudden dizzy spell, lasted 5-10 mins and then subsided.  Continue to monitor.
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
## 2021-12-23 NOTE — ED Provider Notes (Addendum)
[facility]ERG[facility]Y DEPARTMENT Provider Note   CSN: [id number] Arrival date & time: 12/23/21  1022     History  Chief Complaint  Patient presents with   Palpitations    Anthony Byrd is a 51 y.o. male with a past medical history of paroxysmal atrial fibrillation, congestive heart failure, MVR s/p valvuloplasty and MAZE on 1/26 with [facility] presenting today due to A. fib with RVR.  Patient was getting his INR checked when they sent him to the emergency department due to his high heart rate.  Patient denies any palpitations, chest pain, shortness of breath, dizziness or abnormal feelings.  He reports that he left the hospital on Tuesday after his surgery and was in normal sinus rhythm.  On Tuesday and Wednesday patient says that he was coughing and bringing up some mucus.  He was originally concerned about this and concerned that his scar was going to open back up however he called [facility] and they told him that the coughing was normal.  Reports being on metoprolol and amiodarone at home.  Reports that this morning he did not take his medication.   Palpitations     Home Medications Prior to Admission medications   Medication Sig Start Date End Date Taking? Authorizing Provider  amiodarone (NEXTERONE PREMIX) 360-4.14 MG/200ML-% SOLN Inject 60 mg/hr into the vein continuous. 12/09/21   [facility], Tami Lin, PA  furosemide (LASIX) 10 MG/ML injection Inject 8 mLs (80 mg total) into the vein every 12 (twelve) hours. 12/09/21   Ledora Bottcher, PA  heparin 25000 UT/250ML infusion Inject 2,300 Units/hr into the vein continuous. 12/09/21   [facility], Tami Lin, PA  milrinone (PRIMACOR) 20 MG/100 ML SOLN infusion Inject 0.0159 mg/min into the vein continuous. 12/09/21   Ledora Bottcher, PA      Allergies    Peanut-containing drug products    Review of Systems   Review of Systems  Cardiovascular:  Positive for palpitations.   Physical Exam Updated  Vital Signs BP 112/66    Pulse (!) 104    Temp 98.9 F (37.2 C) (Oral)    Resp (!) 24    SpO2 97%  Physical Exam Vitals and nursing note reviewed.  Constitutional:      Appearance: Normal appearance.  HENT:     Head: Normocephalic and atraumatic.  Eyes:     General: No scleral icterus.    Conjunctiva/sclera: Conjunctivae normal.  Cardiovascular:     Rate and Rhythm: Tachycardia present. Rhythm irregular.  Pulmonary:     Effort: Pulmonary effort is normal. No respiratory distress.     Breath sounds: No wheezing or rales.  Musculoskeletal:     Right lower leg: Edema present.     Left lower leg: Edema present.     Comments: 1+ BLE edema  Skin:    General: Skin is warm and dry.     Findings: No rash.     Comments: Well-appearing surgical scars on patient's chest.  No warmth or discharge.  Neurological:     Mental Status: He is alert.  Psychiatric:        Mood and Affect: Mood normal.    ED Results / Procedures / Treatments   Labs (all labs ordered are listed, but only abnormal results are displayed) Labs Reviewed  BASIC METABOLIC PANEL - Abnormal; Notable for the following components:      Result Value   Sodium 134 (*)    Glucose, Bld 114 (*)  Calcium 8.3 (*)    All other components within normal limits  CBC - Abnormal; Notable for the following components:   Hemoglobin 12.6 (*)    All other components within normal limits  PROTIME-INR - Abnormal; Notable for the following components:   Prothrombin Time 28.5 (*)    INR 2.7 (*)    All other components within normal limits  BRAIN NATRIURETIC PEPTIDE - Abnormal; Notable for the following components:   B Natriuretic Peptide 320.0 (*)    All other components within normal limits  MAGNESIUM    EKG EKG Interpretation  Date/Time:  Friday December 23 2021 10:27:45 EST Ventricular Rate:  167 PR Interval:  96 QRS Duration: 82 QT Interval:  260 QTC Calculation: 433 R Axis:   97 Text Interpretation: afib w rvr  Rightward axis Low voltage QRS Cannot rule out Anterior infarct , age undetermined Marked ST abnormality, possible inferior subendocardial injury Abnormal ECG When compared with ECG of 23-Dec-2021 09:59, PREVIOUS ECG IS PRESENT No significant change since last tracing Confirmed by Deno Etienne [phone]) on 12/23/2021 12:17:25 PM  Radiology DG Chest Port 1 View  Result Date: 12/23/2021 CLINICAL DATA:  afib w rvr EXAM: PORTABLE CHEST 1 VIEW COMPARISON:  12/09/2021 FINDINGS: Unchanged cardiomegaly. Prior median sternotomy and mitral annuloplasty. Intact sternotomy wires. Fibular pads overlie the lower chest. There appears to be an appendage clip. Mild diffuse interstitial opacities. No large pleural effusion. No visible pneumothorax. Bilateral shoulder osteoarthritis. IMPRESSION: Postoperative chest with mild pulmonary edema. Unchanged cardiomegaly. Intact sternotomy wires. Electronically Signed   By: Maurine Simmering M.D.   On: 12/23/2021 12:06    Procedures Procedures   Remained in A. fib however rate came down to 90s to 100s after metoprolol and on amiodarone drip  Medications Ordered in ED Medications  amiodarone (NEXTERONE) 1.8 mg/mL load via infusion 150 mg (150 mg Intravenous Bolus from Bag 12/23/21 1144)    Followed by  amiodarone (NEXTERONE PREMIX) 360-4.14 MG/200ML-% (1.8 mg/mL) IV infusion (60 mg/hr Intravenous New Bag/Given 12/23/21 1144)    Followed by  amiodarone (NEXTERONE PREMIX) 360-4.14 MG/200ML-% (1.8 mg/mL) IV infusion (has no administration in time range)  acetaminophen (TYLENOL) tablet 650 mg (650 mg Oral Given 12/23/21 1150)  metoprolol tartrate (LOPRESSOR) injection 10 mg (5 mg Intravenous Given 12/23/21 1045)   ED Course/ Medical Decision Making/ A&P                           Medical Decision Making Amount and/or Complexity of Data Reviewed Labs: ordered. Radiology: ordered.  Risk OTC drugs. Prescription drug management. Decision regarding hospitalization.   Patient presents to  the ED for concern of elevated heart rate.  Differential includes SVT, atrial flutter, atrial flutter   Co morbidities that complicate the patient evaluation include: History of hypertension, CHF, mitral regurgitation status post maze and mitral valve replacement.  Additional history obtained from internal and external records: I reviewed patient's internal previous emergency department visit where he was ultimately diagnosed with atrial fibrillation and referred to cardiology.  I was also able to see his cardiology note with [facility] and ultimately his surgical notes with [facility].  All of these helped me understand the complexity and progression of the patient's care.   Workup:  Lab Tests:  I Ordered, and personally interpreted labs.   The pertinent results include:   2. Imaging Studies ordered:  I ordered imaging studies including x-ray. I independently visualized and interpreted these and agree  with the radiologist's read of some expected pulmonary edema.  No pneumo/hemothorax  3. Cardiac Monitoring:  The patient was maintained on a cardiac monitor.  I personally viewed and interpreted the cardiac monitored which showed atrial fibrillation with a decreased rate.  4. Critical Interventions: Rapid initiation of rate controlling medications to decrease the risk of decompensation.  5. Consultations Obtained:  I requested consultation with MD Gaca at [facility].  I was able to get in touch with the midlevel nurse practitioner who suggested an amiodarone load.  I asked if the patient could be transferred to [facility] and he stated that atrial fibrillation is a common postoperative response and that surgery did not need to be involved at this time.  I requested consult with our on-call cardiologist who will follow the patient throughout their admission.   Treatment:  Medications:  I ordered medication including metoprolol and amiodarone for his A. fib with RVR.   Patient was asymptomatic to begin with, states that he still feels fine.   Dispo:  Problem List / ED Course:  Atrial fibrillation with RVR, after valvuloplasty and maze procedure on 1/26.   Dispostion:  After the interventions noted above, I reevaluated the patient and found that they continue to have no symptoms.  Of note, there was a small amount of serosanguineous fluid coming from his horizontal incision.  Wound has not opened back up.  No intervention needed at this time.  After consideration of the diagnostic results and the patients response to treatment, I feel that the patent would benefit from admission with cardiology following. He is agreeable to this plan.  Final Clinical Impression(s) / ED Diagnoses Final diagnoses:  Atrial fibrillation with RVR ([place])    Rx / DC Orders Admit to internal medicine teaching service.     Rhae Hammock, PA-C 12/23/21 1450    Deno Etienne, DO 12/23/21 1501   PA Vikki Ports with [facility] notified ED team that they will admit the patient. IM updated.   Rhae Hammock, PA-C 12/23/21 [place], [place], DO 12/23/21 1514

## 2021-12-23 NOTE — ED Triage Notes (Signed)
Pt. Was sent here from radiology for fast heart rate. I just had a mitral valve repair last Thursday at [facility]. Pt denies any symptoms.

## 2021-12-23 NOTE — Discharge Instructions (Addendum)
Medication Changes: - INCREASE Amiodarone to 400mg  once daily for 1 week. Then on Wednesday 01/04/2022, you can go back to taking 200mg  once daily. - DECREASE Lasix to 40mg  daily. - START Spironolactone 25mg  daily. - CHANGE Coumadin to 5mg  daily.  - STOP Amlodipine. - STOP Potassium supplement.  **Appointment scheduled in our Coumadin Clinic on Friday 12/30/2021 at 2:30pm to recheck your INR and make any changes if needed. Please also come by our office in 1 weeks so that we can recheck some lab work.**

## 2021-12-24 DIAGNOSIS — R42 Dizziness and giddiness: Secondary | ICD-10-CM

## 2021-12-24 DIAGNOSIS — I5031 Acute diastolic (congestive) heart failure: Secondary | ICD-10-CM

## 2021-12-24 LAB — BASIC METABOLIC PANEL
Anion gap: 11 (ref 5–15)
BUN: 12 mg/dL (ref 6–20)
CO2: 25 mmol/L (ref 22–32)
Calcium: 8.3 mg/dL — ABNORMAL LOW (ref 8.9–10.3)
Chloride: 99 mmol/L (ref 98–111)
Creatinine, Ser: 1.12 mg/dL (ref 0.61–1.24)
GFR, Estimated: >60 mL/min (ref >60)
Glucose, Bld: 94 mg/dL (ref 70–99)
Potassium: 4 mmol/L (ref 3.5–5.1)
Sodium: 135 mmol/L (ref 135–145)

## 2021-12-24 LAB — PROTIME-INR
INR: 2.9 — ABNORMAL HIGH (ref 0.8–1.2)
Prothrombin Time: 30.4 seconds — ABNORMAL HIGH (ref 11.4–15.2)

## 2021-12-24 LAB — TSH: TSH: 1.444 u[IU]/mL (ref 0.350–4.500)

## 2021-12-24 MED ORDER — HYDROCODONE-ACETAMINOPHEN 5-325 MG PO TABS
1.0000 | ORAL_TABLET | ORAL | Status: DC | PRN
  Administered 2021-12-24 – 2021-12-28 (×14): 1 via ORAL
  Filled 2021-12-24 (×14): qty 1

## 2021-12-24 MED ORDER — FUROSEMIDE 40 MG PO TABS
40.0000 mg | ORAL_TABLET | Freq: Every day | ORAL | Status: DC
  Administered 2021-12-25: 40 mg via ORAL
  Filled 2021-12-24: qty 1

## 2021-12-24 MED ORDER — WARFARIN SODIUM 5 MG PO TABS
5.0000 mg | ORAL_TABLET | Freq: Once | ORAL | Status: AC
  Administered 2021-12-24: 5 mg via ORAL
  Filled 2021-12-24: qty 1

## 2021-12-24 MED ORDER — MAGNESIUM SULFATE 2 GM/50ML IV SOLN
2.0000 g | Freq: Once | INTRAVENOUS | Status: AC
  Administered 2021-12-24: 2 g via INTRAVENOUS
  Filled 2021-12-24: qty 50

## 2021-12-24 MED ORDER — GUAIFENESIN-DM 100-10 MG/5ML PO SYRP
5.0000 mL | ORAL_SOLUTION | ORAL | Status: DC | PRN
  Administered 2021-12-24 – 2021-12-27 (×8): 5 mL via ORAL
  Filled 2021-12-24 (×8): qty 5

## 2021-12-24 MED ORDER — FUROSEMIDE 10 MG/ML IJ SOLN
40.0000 mg | Freq: Two times a day (BID) | INTRAMUSCULAR | Status: AC
  Administered 2021-12-24: 40 mg via INTRAVENOUS
  Filled 2021-12-24: qty 4

## 2021-12-24 MED ORDER — BENZONATATE 100 MG PO CAPS
100.0000 mg | ORAL_CAPSULE | Freq: Two times a day (BID) | ORAL | Status: DC | PRN
  Administered 2021-12-24 – 2021-12-26 (×4): 100 mg via ORAL
  Filled 2021-12-24 (×6): qty 1

## 2021-12-24 NOTE — Progress Notes (Signed)
DAILY PROGRESS NOTE   Patient Name: Anthony Byrd Date of Encounter: 12/24/2021 Cardiologist: [name], MD  Chief Complaint   2 dizzy spells overnight  Patient Profile   Anthony Byrd is a 51 y.o. 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   Subjective   Feels less swollen today. Diuresed about 825 cc recorded. BP stable. Creatinine is improving with diuresis (now 1.12, was 1.4 on admit). Had 2 dizzy spells overnight - said he lost vision - no deficits today. Could have been d/t hypotension. INR 2.7 yesterday. Glucose 94 today.  Objective   Vitals:   12/24/21 0022 12/24/21 0300 12/24/21 0535 12/24/21 0727  BP: 121/89 98/68  98/71  Pulse: 99 99  96  Resp: 18 20  20   Temp:  98.2 F (36.8 C)  98.6 F (37 C)  TempSrc:  Oral  Oral  SpO2: 94% 95%  94%  Weight:   127 kg     Intake/Output Summary (Last 24 hours) at 12/24/2021 5885 Last data filed at 12/24/2021 0300 Gross per 24 hour  Intake 424.92 ml  Output 1250 ml  Net -825.08 ml   Filed Weights   12/23/21 1817 12/24/21 0535  Weight: 127.8 kg 127 kg    Physical Exam   General appearance: alert and no distress Neck: JVD - 2 cm above sternal notch, no carotid bruit, and thyroid not enlarged, symmetric, no tenderness/mass/nodules Lungs: diminished breath sounds bibasilar Heart: irregularly irregular rhythm Abdomen: soft, non-tender; bowel sounds normal; no masses,  no organomegaly Extremities: edema 1-2+ bilateral LE Pulses: 2+ and symmetric Skin: Skin color, texture, turgor normal. No rashes or lesions Neurologic: Grossly normal Psych: Pleasant  Inpatient Medications    Scheduled Meds:  furosemide  40 mg Intravenous BID   metoprolol tartrate  25 mg Oral BID    potassium chloride SA  20 mEq Oral BID   sodium chloride flush  3 mL Intravenous Q12H   Warfarin - Pharmacist Dosing Inpatient   Does not apply q1600    Continuous Infusions:  sodium chloride     amiodarone 30 mg/hr (12/24/21 0210)    PRN Meds: sodium chloride, acetaminophen, HYDROmorphone, ondansetron (ZOFRAN) IV, polyethylene glycol, sodium chloride flush   Labs   Results for orders placed or performed during the hospital encounter of 12/23/21 (from the past 48 hour(s))  Basic metabolic panel     Status: Abnormal   Collection Time: 12/23/21 11:05 AM  Result Value Ref Range   Sodium 134 (L) 135 - 145 mmol/L   Potassium 3.7 3.5 - 5.1 mmol/L   Chloride 99 98 - 111 mmol/L   CO2 25 22 - 32 mmol/L   Glucose, Bld 114 (H) 70 - 99 mg/dL    Comment: Glucose reference range applies only to samples taken after fasting for at least 8 hours.   BUN 13 6 - 20 mg/dL   Creatinine, Ser 1.23 0.61 - 1.24 mg/dL   Calcium 8.3 (L) 8.9 - 10.3 mg/dL   GFR, Estimated >60 >60 mL/min    Comment: (NOTE) Calculated using the CKD-EPI Creatinine Equation (2021)    Anion gap 10 5 - 15    Comment: Performed at [facility] [address], [place], [place] [address]  CBC  Status: Abnormal   Collection Time: 12/23/21 11:05 AM  Result Value Ref Range   WBC 9.9 4.0 - 10.5 K/uL   RBC 4.53 4.22 - 5.81 MIL/uL   Hemoglobin 12.6 (L) 13.0 - 17.0 g/dL   HCT 40.3 39.0 - 52.0 %   MCV 89.0 80.0 - 100.0 fL   MCH 27.8 26.0 - 34.0 pg   MCHC 31.3 30.0 - 36.0 g/dL   RDW 15.4 11.5 - 15.5 %   Platelets 363 150 - 400 K/uL   nRBC 0.0 0.0 - 0.2 %    Comment: Performed at [facility], [place] [address] [place], [place] [address]  Protime-INR- (order if Patient is taking Coumadin / Warfarin)     Status: Abnormal   Collection Time: 12/23/21 11:05 AM  Result Value Ref Range   Prothrombin Time 28.5 (H) 11.4 - 15.2 seconds   INR 2.7 (H) 0.8 - 1.2    Comment: (NOTE) INR goal varies based on device and disease  states. Performed at [facility], [place] [address] [place], [place] [address]   Brain natriuretic peptide     Status: Abnormal   Collection Time: 12/23/21 11:14 AM  Result Value Ref Range   B Natriuretic Peptide 320.0 (H) 0.0 - 100.0 pg/mL    Comment: Performed at [facility] [address] [place], [place] [address]  Magnesium     Status: None   Collection Time: 12/23/21 11:14 AM  Result Value Ref Range   Magnesium 1.8 1.7 - 2.4 mg/dL    Comment: Performed at [facility] [address] [place], [place] [address]  Resp Panel by RT-PCR (Flu A&B, Covid) Nasopharyngeal Swab     Status: None   Collection Time: 12/23/21  3:51 PM   Specimen: Nasopharyngeal Swab; Nasopharyngeal(NP) swabs in vial transport medium  Result Value Ref Range   SARS Coronavirus 2 by RT PCR NEGATIVE NEGATIVE    Comment: (NOTE) SARS-CoV-2 target nucleic acids are NOT DETECTED.  The SARS-CoV-2 RNA is generally detectable in upper respiratory specimens during the acute phase of infection. The lowest concentration of SARS-CoV-2 viral copies this assay can detect is 138 copies/mL. A negative result does not preclude SARS-Cov-2 infection and should not be used as the sole basis for treatment or other patient management decisions. A negative result may occur with  improper specimen collection/handling, submission of specimen other than nasopharyngeal swab, presence of viral mutation(s) within the areas targeted by this assay, and inadequate number of viral copies(<138 copies/mL). A negative result must be combined with clinical observations, patient history, and epidemiological information. The expected result is Negative.  Fact Sheet for Patients:  EntrepreneurPulse.com.au  Fact Sheet for Healthcare Providers:  IncredibleEmployment.be  This test is no t yet approved or cleared by the [place] FDA and  has been authorized for detection and/or  diagnosis of SARS-CoV-2 by FDA under an Emergency Use Authorization (EUA). This EUA will remain  in effect (meaning this test can be used) for the duration of the COVID-19 declaration under Section 564(b)(1) of the Act, 21 U.S.C.section 360bbb-3(b)(1), unless the authorization is terminated  or revoked sooner.       Influenza A by PCR NEGATIVE NEGATIVE   Influenza B by PCR NEGATIVE NEGATIVE    Comment: (NOTE) The Xpert Xpress SARS-CoV-2/FLU/RSV plus assay is intended as an aid in the diagnosis of influenza from Nasopharyngeal swab specimens and should not be used as a sole basis for treatment. Nasal washings and  aspirates are unacceptable for Xpert Xpress SARS-CoV-2/FLU/RSV testing.  Fact Sheet for Patients: EntrepreneurPulse.com.au  Fact Sheet for Healthcare Providers: IncredibleEmployment.be  This test is not yet approved or cleared by the [place] FDA and has been authorized for detection and/or diagnosis of SARS-CoV-2 by FDA under an Emergency Use Authorization (EUA). This EUA will remain in effect (meaning this test can be used) for the duration of the COVID-19 declaration under Section 564(b)(1) of the Act, 21 U.S.C. section 360bbb-3(b)(1), unless the authorization is terminated or revoked.  Performed at [facility], [place] [address], [place], [place] [address]   MRSA Next Gen by PCR, Nasal     Status: None   Collection Time: 12/23/21  6:33 PM   Specimen: Nasal Mucosa; Nasal Swab  Result Value Ref Range   MRSA by PCR Next Gen NOT DETECTED NOT DETECTED    Comment: (NOTE) The GeneXpert MRSA Assay (FDA approved for NASAL specimens only), is one component of a comprehensive MRSA colonization surveillance program. It is not intended to diagnose MRSA infection nor to guide or monitor treatment for MRSA infections. Test performance is not FDA approved in patients less than 19 years old. Performed at [facility],  [place] [address] [place], [place] [address]   Basic metabolic panel     Status: Abnormal   Collection Time: 12/24/21  6:39 AM  Result Value Ref Range   Sodium 135 135 - 145 mmol/L   Potassium 4.0 3.5 - 5.1 mmol/L   Chloride 99 98 - 111 mmol/L   CO2 25 22 - 32 mmol/L   Glucose, Bld 94 70 - 99 mg/dL    Comment: Glucose reference range applies only to samples taken after fasting for at least 8 hours.   BUN 12 6 - 20 mg/dL   Creatinine, Ser 1.12 0.61 - 1.24 mg/dL   Calcium 8.3 (L) 8.9 - 10.3 mg/dL   GFR, Estimated >60 >60 mL/min    Comment: (NOTE) Calculated using the CKD-EPI Creatinine Equation (2021)    Anion gap 11 5 - 15    Comment: Performed at [facility] [address] [place], [place] [address]  TSH     Status: None   Collection Time: 12/24/21  6:39 AM  Result Value Ref Range   TSH 1.444 0.350 - 4.500 uIU/mL    Comment: Performed by a 3rd Generation assay with a functional sensitivity of <=0.01 uIU/mL. Performed at [facility], [place] [address], [place], [place] [address]     ECG   N/A  Telemetry   Atrial flutter with variable ventricular response - Personally Reviewed  Radiology    DG Chest Port 1 View  Result Date: 12/23/2021 CLINICAL DATA:  afib w rvr EXAM: PORTABLE CHEST 1 VIEW COMPARISON:  12/09/2021 FINDINGS: Unchanged cardiomegaly. Prior median sternotomy and mitral annuloplasty. Intact sternotomy wires. Fibular pads overlie the lower chest. There appears to be an appendage clip. Mild diffuse interstitial opacities. No large pleural effusion. No visible pneumothorax. Bilateral shoulder osteoarthritis. IMPRESSION: Postoperative chest with mild pulmonary edema. Unchanged cardiomegaly. Intact sternotomy wires. Electronically Signed   By: Maurine Simmering M.D.   On: 12/23/2021 12:06    Cardiac Studies   N/A  Assessment   Principal Problem:   Atrial flutter (HCC)   Plan   Persistent Atrial Fibrillation, here with rapid atrial flutter: s/p MAZE procedure  on 12/15/21. Discharged on amiodarone, metoprolol   - Started on IV amiodarone in ED, now rate-controlled - INR  2.7. On warfarin s/p mitral valve repair, unable to afford DOAC in the past, continue warfarin per pharmacy  - Tentatively scheduled TEE/cardioversion for Tuesday at noon with Dr. Oval Linsey   Acute on Chronic Diastolic Heart Failure  - BNP elevated to 320 (up from 102 on 12/01/21) - Echo on 12/02/21 showed EF 50-55%, moderate LVH, severely dilated LA, severe mitral regurgitation. Cath on 1/16 showed no evidence of CAD - Diuresing on 40mg  lasix IV BID - may be able to switch to oral lasix tomorrow - Place TED hose, elevated feet - Restarted home lopressor 25 MG  - Hold home amlodipine as BP soft   Mitral Valve Disease, s/p mitral valve repair  - Stable on exam  - Postop echo 12/19/21 EF 50%, normal RV, mild MR, mild TR, mild MS, s/p MV repair - Will need reminder for SBE ppx at DC   OSA - Encourage CPAP use, will order here   CKD stage II - Creatinine mildly elevated and improving with diuresis  Dizziness, transient vision loss - ? Hypotension -was in 90's overnight- ?cerebral embolic event - INR therapeutic on warfarin, so less likely - no significant hypoglycemia. No focal neuro findings today - would monitor, if there is recurrence, consider head MRI.  Time Spent Directly with Patient:  I have spent a total of 35 minutes with the patient reviewing hospital notes, telemetry, EKGs, labs and examining the patient as well as establishing an assessment and plan that was discussed personally with the patient.  > 50% of time was spent in direct patient care.  Length of Stay:  LOS: 1 day   Pixie Casino, MD, [facility], [place] Director of the Advanced Lipid Disorders &  Cardiovascular Risk Reduction Clinic Diplomate of the American Board of Clinical Lipidology Attending Cardiologist  Direct Dial: [phone]   Fax: [phone]  Website:   [email or web address] Redmon 12/24/2021, 8:07 AM

## 2021-12-24 NOTE — Progress Notes (Signed)
Placed patient on CPAP for HS use. Auto settings (max 18, miin 6.0) cm H20 with 4L o2 bleed in. Tolerating well at this time.
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
## 2021-12-24 NOTE — Social Work (Signed)
CSW acknowledges consult for SNF/HH. The patient will require PT/OT evaluations. TOC will assist with disposition planning once the evaluations have been completed.  °  °TOC will continue to follow.    °

## 2021-12-25 ENCOUNTER — Encounter: Payer: Self-pay | Admitting: Internal Medicine

## 2021-12-25 DIAGNOSIS — I4892 Unspecified atrial flutter: Secondary | ICD-10-CM

## 2021-12-25 LAB — CBC
HCT: 36.7 % — ABNORMAL LOW (ref 39.0–52.0)
Hemoglobin: 12 g/dL — ABNORMAL LOW (ref 13.0–17.0)
MCH: 28.6 pg (ref 26.0–34.0)
MCHC: 32.7 g/dL (ref 30.0–36.0)
MCV: 87.6 fL (ref 80.0–100.0)
Platelets: 499 10*3/uL — ABNORMAL HIGH (ref 150–400)
RBC: 4.19 MIL/uL — ABNORMAL LOW (ref 4.22–5.81)
RDW: 15.5 % (ref 11.5–15.5)
WBC: 7.7 10*3/uL (ref 4.0–10.5)
nRBC: 0 % (ref 0.0–0.2)

## 2021-12-25 LAB — BASIC METABOLIC PANEL
Anion gap: 11 (ref 5–15)
BUN: 13 mg/dL (ref 6–20)
CO2: 27 mmol/L (ref 22–32)
Calcium: 8.3 mg/dL — ABNORMAL LOW (ref 8.9–10.3)
Chloride: 99 mmol/L (ref 98–111)
Creatinine, Ser: 1.02 mg/dL (ref 0.61–1.24)
GFR, Estimated: >60 mL/min (ref >60)
Glucose, Bld: 88 mg/dL (ref 70–99)
Potassium: 4.1 mmol/L (ref 3.5–5.1)
Sodium: 137 mmol/L (ref 135–145)

## 2021-12-25 LAB — PROTIME-INR
INR: 3.4 — ABNORMAL HIGH (ref 0.8–1.2)
Prothrombin Time: 34.4 seconds — ABNORMAL HIGH (ref 11.4–15.2)

## 2021-12-25 LAB — MAGNESIUM: Magnesium: 1.9 mg/dL (ref 1.7–2.4)

## 2021-12-25 MED ORDER — MAGNESIUM SULFATE 2 GM/50ML IV SOLN
2.0000 g | Freq: Once | INTRAVENOUS | Status: AC
  Administered 2021-12-25: 2 g via INTRAVENOUS
  Filled 2021-12-25: qty 50

## 2021-12-25 NOTE — Progress Notes (Signed)
Patient given handout on DASH diet.  Additionally, provided patient with paper to record his daily weights once discharged.

## 2021-12-25 NOTE — Progress Notes (Signed)
Writer began educating patient on heart failure diet, daily weights, and salt intake. Patient very eager to learn and asking intelligent questions. Tracking fluid intake on paper today to help patient gage what 2000 ml a day looks like. Patient eager to learn asking questions about salt intake and changes he could make. He requested nutrition consult to learn more tips for heart healthy eating. MD paged with request for nutrition consult.

## 2021-12-25 NOTE — Plan of Care (Signed)
°  Problem: Education: Goal: Ability to demonstrate management of disease process will improve Outcome: Progressing   Problem: Education: Goal: Knowledge of General Education information will improve Description: Including pain rating scale, medication(s)/side effects and non-pharmacologic comfort measures Outcome: Progressing   Problem: Health Behavior/Discharge Planning: Goal: Ability to manage health-related needs will improve Outcome: Progressing   Problem: Clinical Measurements: Goal: Diagnostic test results will improve Outcome: Progressing Goal: Respiratory complications will improve Outcome: Progressing Goal: Cardiovascular complication will be avoided Outcome: Progressing   Problem: Pain Managment: Goal: General experience of comfort will improve Outcome: Progressing
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
## 2021-12-25 NOTE — Progress Notes (Signed)
Patient was evaluated and found to have evidence of Afib with RVR. He recently had an ablation surgery and has found to have a subtherapeutic INR. Patient was brought up to the ED for further evaluation and management.   Lawerance Cruel, D.O.  Internal Medicine Resident, PGY-3 [facility]  Pager: [phone] 5:51 AM, 12/25/2021

## 2021-12-25 NOTE — Progress Notes (Signed)
DAILY PROGRESS NOTE   Patient Name: Anthony Byrd Date of Encounter: 12/25/2021 Cardiologist: [name], MD  Chief Complaint    No complaints  Patient Profile   Anthony Byrd is a 51 y.o. 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   Subjective   Diursed another 1.1L overnight.  INR up to 3.4, pharmacy managing. Creatinine lower today at 1.02. Remains in flutter on amiodarone - rate-controlled. Leg edema is marginally better. Says he found out that he can get Xarelto for a reasonable cost and interested in switching in the future.  Objective   Vitals:   12/24/21 2358 12/25/21 0000 12/25/21 0412 12/25/21 0817  BP: 107/63 107/63 105/64 109/72  Pulse: 85 84 98 74  Resp: 20 16 12 18   Temp: 97.9 F (36.6 C)  98 F (36.7 C) 98.4 F (36.9 C)  TempSrc: Oral  Oral Oral  SpO2: 95% 94% 100% 93%  Weight:   127 kg     Intake/Output Summary (Last 24 hours) at 12/25/2021 0827 Last data filed at 12/25/2021 0600 Gross per 24 hour  Intake 1152.08 ml  Output 1920 ml  Net -767.92 ml   Filed Weights   12/23/21 1817 12/24/21 0535 12/25/21 0412  Weight: 127.8 kg 127 kg 127 kg    Physical Exam   General appearance: alert and no distress Neck: JVD - 2 cm above sternal notch, no carotid bruit, and thyroid not enlarged, symmetric, no tenderness/mass/nodules Lungs: diminished breath sounds bibasilar Heart: irregularly irregular rhythm Abdomen: soft, non-tender; bowel sounds normal; no masses,  no organomegaly Extremities: edema 1-2+ bilateral LE Pulses: 2+ and symmetric Skin: Skin color, texture, turgor normal. No rashes or lesions Neurologic: Grossly normal Psych: Pleasant  Inpatient Medications    Scheduled Meds:  furosemide  40 mg Oral  Daily   metoprolol tartrate  25 mg Oral BID   potassium chloride SA  20 mEq Oral BID   sodium chloride flush  3 mL Intravenous Q12H   Warfarin - Pharmacist Dosing Inpatient   Does not apply q1600    Continuous Infusions:  sodium chloride     amiodarone 30 mg/hr (12/25/21 0017)    PRN Meds: sodium chloride, acetaminophen, benzonatate, guaiFENesin-dextromethorphan, HYDROcodone-acetaminophen, ondansetron (ZOFRAN) IV, polyethylene glycol, sodium chloride flush   Labs   Results for orders placed or performed during the hospital encounter of 12/23/21 (from the past 48 hour(s))  Basic metabolic panel     Status: Abnormal   Collection Time: 12/23/21 11:05 AM  Result Value Ref Range   Sodium 134 (L) 135 - 145 mmol/L   Potassium 3.7 3.5 - 5.1 mmol/L   Chloride 99 98 - 111 mmol/L   CO2 25 22 - 32 mmol/L   Glucose, Bld 114 (H) 70 - 99 mg/dL    Comment: Glucose reference range applies only to samples taken after fasting for at least 8 hours.   BUN 13 6 - 20 mg/dL   Creatinine, Ser 1.23 0.61 - 1.24 mg/dL   Calcium 8.3 (L) 8.9 - 10.3 mg/dL   GFR, Estimated >60 >60 mL/min    Comment: (NOTE) Calculated using the CKD-EPI Creatinine Equation (2021)    Anion gap 10 5 - 15    Comment: Performed at [facility]  [address] [place], [place] [address]  CBC     Status: Abnormal   Collection Time: 12/23/21 11:05 AM  Result Value Ref Range   WBC 9.9 4.0 - 10.5 K/uL   RBC 4.53 4.22 - 5.81 MIL/uL   Hemoglobin 12.6 (L) 13.0 - 17.0 g/dL   HCT 40.3 39.0 - 52.0 %   MCV 89.0 80.0 - 100.0 fL   MCH 27.8 26.0 - 34.0 pg   MCHC 31.3 30.0 - 36.0 g/dL   RDW 15.4 11.5 - 15.5 %   Platelets 363 150 - 400 K/uL   nRBC 0.0 0.0 - 0.2 %    Comment: Performed at [facility], [place] [address] [place], [place] [address]  Protime-INR- (order if Patient is taking Coumadin / Warfarin)     Status: Abnormal   Collection Time: 12/23/21 11:05 AM  Result Value Ref Range   Prothrombin Time 28.5 (H) 11.4 -  15.2 seconds   INR 2.7 (H) 0.8 - 1.2    Comment: (NOTE) INR goal varies based on device and disease states. Performed at [facility], [place] [address] [place], [place] [address]   Brain natriuretic peptide     Status: Abnormal   Collection Time: 12/23/21 11:14 AM  Result Value Ref Range   B Natriuretic Peptide 320.0 (H) 0.0 - 100.0 pg/mL    Comment: Performed at [facility] [address], [place], [place] [address]  Magnesium     Status: None   Collection Time: 12/23/21 11:14 AM  Result Value Ref Range   Magnesium 1.8 1.7 - 2.4 mg/dL    Comment: Performed at [facility] [address] [place], [place] [address]  Resp Panel by RT-PCR (Flu A&B, Covid) Nasopharyngeal Swab     Status: None   Collection Time: 12/23/21  3:51 PM   Specimen: Nasopharyngeal Swab; Nasopharyngeal(NP) swabs in vial transport medium  Result Value Ref Range   SARS Coronavirus 2 by RT PCR NEGATIVE NEGATIVE    Comment: (NOTE) SARS-CoV-2 target nucleic acids are NOT DETECTED.  The SARS-CoV-2 RNA is generally detectable in upper respiratory specimens during the acute phase of infection. The lowest concentration of SARS-CoV-2 viral copies this assay can detect is 138 copies/mL. A negative result does not preclude SARS-Cov-2 infection and should not be used as the sole basis for treatment or other patient management decisions. A negative result may occur with  improper specimen collection/handling, submission of specimen other than nasopharyngeal swab, presence of viral mutation(s) within the areas targeted by this assay, and inadequate number of viral copies(<138 copies/mL). A negative result must be combined with clinical observations, patient history, and epidemiological information. The expected result is Negative.  Fact Sheet for Patients:  EntrepreneurPulse.com.au  Fact Sheet for Healthcare Providers:  IncredibleEmployment.be  This test is no t  yet approved or cleared by the [place] FDA and  has been authorized for detection and/or diagnosis of SARS-CoV-2 by FDA under an Emergency Use Authorization (EUA). This EUA will remain  in effect (meaning this test can be used) for the duration of the COVID-19 declaration under Section 564(b)(1) of the Act, 21 U.S.C.section 360bbb-3(b)(1), unless the authorization is terminated  or revoked sooner.       Influenza A by PCR NEGATIVE NEGATIVE   Influenza B by PCR NEGATIVE NEGATIVE    Comment: (NOTE) The Xpert Xpress SARS-CoV-2/FLU/RSV plus assay is intended as an aid in the diagnosis of influenza from Nasopharyngeal swab specimens and should not  be used as a sole basis for treatment. Nasal washings and aspirates are unacceptable for Xpert Xpress SARS-CoV-2/FLU/RSV testing.  Fact Sheet for Patients: EntrepreneurPulse.com.au  Fact Sheet for Healthcare Providers: IncredibleEmployment.be  This test is not yet approved or cleared by the [place] FDA and has been authorized for detection and/or diagnosis of SARS-CoV-2 by FDA under an Emergency Use Authorization (EUA). This EUA will remain in effect (meaning this test can be used) for the duration of the COVID-19 declaration under Section 564(b)(1) of the Act, 21 U.S.C. section 360bbb-3(b)(1), unless the authorization is terminated or revoked.  Performed at [facility], [place] [address], [place], [place] [address]   MRSA Next Gen by PCR, Nasal     Status: None   Collection Time: 12/23/21  6:33 PM   Specimen: Nasal Mucosa; Nasal Swab  Result Value Ref Range   MRSA by PCR Next Gen NOT DETECTED NOT DETECTED    Comment: (NOTE) The GeneXpert MRSA Assay (FDA approved for NASAL specimens only), is one component of a comprehensive MRSA colonization surveillance program. It is not intended to diagnose MRSA infection nor to guide or monitor treatment for MRSA infections. Test performance is  not FDA approved in patients less than 58 years old. Performed at [facility], [place] [address] [place], [place] [address]   Basic metabolic panel     Status: Abnormal   Collection Time: 12/24/21  6:39 AM  Result Value Ref Range   Sodium 135 135 - 145 mmol/L   Potassium 4.0 3.5 - 5.1 mmol/L   Chloride 99 98 - 111 mmol/L   CO2 25 22 - 32 mmol/L   Glucose, Bld 94 70 - 99 mg/dL    Comment: Glucose reference range applies only to samples taken after fasting for at least 8 hours.   BUN 12 6 - 20 mg/dL   Creatinine, Ser 1.12 0.61 - 1.24 mg/dL   Calcium 8.3 (L) 8.9 - 10.3 mg/dL   GFR, Estimated >60 >60 mL/min    Comment: (NOTE) Calculated using the CKD-EPI Creatinine Equation (2021)    Anion gap 11 5 - 15    Comment: Performed at [facility] [address], [place], [place] [address]  TSH     Status: None   Collection Time: 12/24/21  6:39 AM  Result Value Ref Range   TSH 1.444 0.350 - 4.500 uIU/mL    Comment: Performed by a 3rd Generation assay with a functional sensitivity of <=0.01 uIU/mL. Performed at [facility], [place] [address] [place], [place] [address]   Protime-INR     Status: Abnormal   Collection Time: 12/24/21 12:40 PM  Result Value Ref Range   Prothrombin Time 30.4 (H) 11.4 - 15.2 seconds   INR 2.9 (H) 0.8 - 1.2    Comment: (NOTE) INR goal varies based on device and disease states. Performed at [facility], [place] [address] [place], [place] [address]   Protime-INR     Status: Abnormal   Collection Time: 12/25/21  1:27 AM  Result Value Ref Range   Prothrombin Time 34.4 (H) 11.4 - 15.2 seconds   INR 3.4 (H) 0.8 - 1.2    Comment: (NOTE) INR goal varies based on device and disease states. Performed at [facility], [place] [address] [place],  [address]   Basic metabolic panel     Status: Abnormal   Collection Time: 12/25/21  6:05 AM  Result Value Ref Range  Sodium 137 135 - 145 mmol/L   Potassium 4.1 3.5 - 5.1 mmol/L    Chloride 99 98 - 111 mmol/L   CO2 27 22 - 32 mmol/L   Glucose, Bld 88 70 - 99 mg/dL    Comment: Glucose reference range applies only to samples taken after fasting for at least 8 hours.   BUN 13 6 - 20 mg/dL   Creatinine, Ser 1.02 0.61 - 1.24 mg/dL   Calcium 8.3 (L) 8.9 - 10.3 mg/dL   GFR, Estimated >60 >60 mL/min    Comment: (NOTE) Calculated using the CKD-EPI Creatinine Equation (2021)    Anion gap 11 5 - 15    Comment: Performed at [facility] [address], [place], [place] [address]  Magnesium     Status: None   Collection Time: 12/25/21  6:05 AM  Result Value Ref Range   Magnesium 1.9 1.7 - 2.4 mg/dL    Comment: Performed at [facility] [address], [place], [place] [address]    ECG   N/A  Telemetry   Atrial flutter with variable ventricular response - Personally Reviewed  Radiology    DG Chest Port 1 View  Result Date: 12/23/2021 CLINICAL DATA:  afib w rvr EXAM: PORTABLE CHEST 1 VIEW COMPARISON:  12/09/2021 FINDINGS: Unchanged cardiomegaly. Prior median sternotomy and mitral annuloplasty. Intact sternotomy wires. Fibular pads overlie the lower chest. There appears to be an appendage clip. Mild diffuse interstitial opacities. No large pleural effusion. No visible pneumothorax. Bilateral shoulder osteoarthritis. IMPRESSION: Postoperative chest with mild pulmonary edema. Unchanged cardiomegaly. Intact sternotomy wires. Electronically Signed   By: Maurine Simmering M.D.   On: 12/23/2021 12:06    Cardiac Studies   N/A  Assessment   Principal Problem:   Atrial flutter (HCC)   Plan   Persistent Atrial Fibrillation, here with rapid atrial flutter: s/p MAZE procedure on 12/15/21. Discharged on amiodarone, metoprolol   - Started on IV amiodarone in ED, now rate-controlled - INR 3.4. On warfarin s/p mitral valve repair, unable to afford DOAC in the past, but now interested in Xarelto. Fo now, continue warfarin per pharmacy but plan transition to Xarelto in the  near future. - Tentatively scheduled TEE/cardioversion for Tuesday at noon with Dr. Oval Linsey   Acute on Chronic Diastolic Heart Failure  - BNP elevated to 320 (up from 102 on 12/01/21) - Echo on 12/02/21 showed EF 50-55%, moderate LVH, severely dilated LA, severe mitral regurgitation. Cath on 1/16 showed no evidence of CAD - Diuresed well, plan transition to oral lasix 40 mg daily today - Place TED hose, elevated feet - Restarted home lopressor 25 MG  - Hold home amlodipine as BP soft   Mitral Valve Disease, s/p mitral valve repair  - Stable on exam  - Postop echo 12/19/21 EF 50%, normal RV, mild MR, mild TR, mild MS, s/p MV repair - Will need reminder for SBE ppx at DC   OSA - Encourage CPAP use, will order here   CKD stage II - Creatinine mildly elevated and improving with diuresis  Dizziness, transient vision loss - ? Hypotension - no recurrence last night  Time Spent Directly with Patient:  I have spent a total of 25 minutes with the patient reviewing hospital notes, telemetry, EKGs, labs and examining the patient as well as establishing an assessment and plan that was discussed personally with the patient.  > 50% of time was spent in direct patient care.  Length of Stay:  LOS:  2 days   Pixie Casino, MD, [facility], [place] Director of the Advanced Lipid Disorders &  Cardiovascular Risk Reduction Clinic Diplomate of the American Board of Clinical Lipidology Attending Cardiologist  Direct Dial: [phone]   Fax: [phone]  Website:  [email or web address] Sani Madariaga 12/25/2021, 8:27 AM

## 2021-12-26 LAB — BASIC METABOLIC PANEL
Anion gap: 10 (ref 5–15)
BUN: 13 mg/dL (ref 6–20)
CO2: 26 mmol/L (ref 22–32)
Calcium: 8.4 mg/dL — ABNORMAL LOW (ref 8.9–10.3)
Chloride: 101 mmol/L (ref 98–111)
Creatinine, Ser: 1.01 mg/dL (ref 0.61–1.24)
GFR, Estimated: >60 mL/min (ref >60)
Glucose, Bld: 108 mg/dL — ABNORMAL HIGH (ref 70–99)
Potassium: 4.1 mmol/L (ref 3.5–5.1)
Sodium: 137 mmol/L (ref 135–145)

## 2021-12-26 LAB — CBC
HCT: 37.7 % — ABNORMAL LOW (ref 39.0–52.0)
Hemoglobin: 12.2 g/dL — ABNORMAL LOW (ref 13.0–17.0)
MCH: 28.6 pg (ref 26.0–34.0)
MCHC: 32.4 g/dL (ref 30.0–36.0)
MCV: 88.5 fL (ref 80.0–100.0)
Platelets: 385 10*3/uL (ref 150–400)
RBC: 4.26 MIL/uL (ref 4.22–5.81)
RDW: 15.2 % (ref 11.5–15.5)
WBC: 7.4 10*3/uL (ref 4.0–10.5)
nRBC: 0 % (ref 0.0–0.2)

## 2021-12-26 LAB — PROTIME-INR
INR: 2.8 — ABNORMAL HIGH (ref 0.8–1.2)
Prothrombin Time: 29.4 seconds — ABNORMAL HIGH (ref 11.4–15.2)

## 2021-12-26 MED ORDER — BENZONATATE 100 MG PO CAPS
200.0000 mg | ORAL_CAPSULE | Freq: Three times a day (TID) | ORAL | Status: DC | PRN
  Administered 2021-12-26 – 2021-12-28 (×5): 200 mg via ORAL
  Filled 2021-12-26 (×4): qty 2

## 2021-12-26 MED ORDER — SPIRONOLACTONE 25 MG PO TABS
25.0000 mg | ORAL_TABLET | Freq: Every day | ORAL | Status: DC
  Administered 2021-12-26 – 2021-12-28 (×3): 25 mg via ORAL
  Filled 2021-12-26 (×3): qty 1

## 2021-12-26 MED ORDER — WARFARIN SODIUM 5 MG PO TABS
5.0000 mg | ORAL_TABLET | Freq: Once | ORAL | Status: AC
  Administered 2021-12-26: 5 mg via ORAL
  Filled 2021-12-26: qty 1

## 2021-12-26 MED ORDER — FUROSEMIDE 10 MG/ML IJ SOLN
40.0000 mg | Freq: Every day | INTRAMUSCULAR | Status: DC
  Administered 2021-12-26 – 2021-12-27 (×2): 40 mg via INTRAVENOUS
  Filled 2021-12-26 (×2): qty 4

## 2021-12-26 MED ORDER — SODIUM CHLORIDE 0.9 % IV SOLN: INTRAVENOUS | Status: DC

## 2021-12-26 NOTE — Progress Notes (Signed)
Mobility Specialist Criteria Algorithm Info.    12/26/21 1600  Mobility  Activity Ambulated independently in hallway  Range of Motion/Exercises Active;All extremities  Level of Assistance Independent  Assistive Device Other (Comment) (IV Pole)  Distance Ambulated (ft) 580 ft  Activity Response Tolerated well   Patient received dangling EOB eager to participate. Ambulated in hallway without complaint or incident. Returned to room without incident or complaint. Was left dangling EOB with all needs met.   HR pre: 100-112bpm  HR post: 111-120bpm (SV Tach) HR peak: 131 bpm  SPO2% pre: 96% RA SPO2% ambulating: 90-92% RA SPO2% post: 99% RA  12/26/2021 4:27 PM  Anthony Byrd, CMS, [place]  GXIVH:[phone] Office: [phone]

## 2021-12-26 NOTE — Progress Notes (Signed)
Heart Failure Navigator Progress Note  Assessed for Heart & Vascular TOC clinic readiness. Pt s/p MAZE and MVRx completed with [facility] 12/15/2021. Educated regarding fluid and sodium restrictions, medication compliance. 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   Pt declined HV TOC clinic appt upon DC from hospitalization. Pt educated on benefits of HV TOC. Pt has follow up with CT sx team 2/13, then plans to go to sister's house at [facility] area to recoup.    Pricilla Holm, MSN, RN Heart Failure Nurse Navigator [phone]
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
## 2021-12-26 NOTE — Progress Notes (Signed)
Progress Note  Patient Name: Anthony Byrd Date of Encounter: 12/26/2021  [facility] Cardiologist: [name], MD   Subjective   No CP; mild dyspnea  Inpatient Medications    Scheduled Meds:  furosemide  40 mg Oral Daily   metoprolol tartrate  25 mg Oral BID   potassium chloride SA  20 mEq Oral BID   sodium chloride flush  3 mL Intravenous Q12H   Warfarin - Pharmacist Dosing Inpatient   Does not apply q1600   Continuous Infusions:  sodium chloride     amiodarone 30 mg/hr (12/26/21 0041)   PRN Meds: sodium chloride, acetaminophen, benzonatate, guaiFENesin-dextromethorphan, HYDROcodone-acetaminophen, ondansetron (ZOFRAN) IV, polyethylene glycol, sodium chloride flush   Vital Signs    Vitals:   12/25/21 2022 12/25/21 2339 12/26/21 0045 12/26/21 0440  BP: 121/81 112/76 114/87 122/79  Pulse: 78 73 94 (!) 114  Resp: 12 15 14 19   Temp: 98.7 F (37.1 C) 98 F (36.7 C)  98.4 F (36.9 C)  TempSrc: Oral Oral  Oral  SpO2: 97% 98% 92% 98%  Weight:    126.1 kg    Intake/Output Summary (Last 24 hours) at 12/26/2021 0747 Last data filed at 12/26/2021 0400 Gross per 24 hour  Intake 1083.98 ml  Output 1000 ml  Net 83.98 ml   Last 3 Weights 12/26/2021 12/25/2021 12/24/2021  Weight (lbs) 278 lb 279 lb 15.8 oz 279 lb 15.8 oz  Weight (kg) 126.1 kg 127 kg 127 kg      Telemetry    Atrial flutter vs coarse atrial fibrillation, rate controlled - Personally Reviewed  Physical Exam   GEN: No acute distress.   Neck: No JVD Cardiac: irregular Respiratory: Clear to auscultation bilaterally. GI: Soft, nontender, non-distended  MS: 1+ edema Neuro:  Nonfocal  Psych: Normal affect   Labs    Chemistry Recent Labs  Lab 12/23/21 1114 12/24/21 0639 12/25/21 0605 12/26/21 0018  NA  --  135 137 137  K  --  4.0 4.1 4.1  CL  --  99 99 101  CO2  --  25 27 26   GLUCOSE  --  94 88 108*  BUN  --  12 13 13   CREATININE  --  1.12 1.02 1.01  CALCIUM  --  8.3* 8.3* 8.4*  MG 1.8   --  1.9  --   GFRNONAA  --  >60 >60 >60  ANIONGAP  --  11 11 10      Hematology Recent Labs  Lab 12/23/21 1105 12/25/21 0922 12/26/21 0018  WBC 9.9 7.7 7.4  RBC 4.53 4.19* 4.26  HGB 12.6* 12.0* 12.2*  HCT 40.3 36.7* 37.7*  MCV 89.0 87.6 88.5  MCH 27.8 28.6 28.6  MCHC 31.3 32.7 32.4  RDW 15.4 15.5 15.2  PLT 363 499* 385   Thyroid  Recent Labs  Lab 12/24/21 0639  TSH 1.444    BNP Recent Labs  Lab 12/23/21 1114  BNP 320.0*     Patient Profile     51 y.o. male with recent (12/15/21) mitral valve repair and maze procedure/LAA ligation for persistent atrial fibrillation at [facility], chronic diastolic congestive heart failure, obstructive sleep apnea, morbid obesity, chronic stage II kidney disease, history of testicular cancer being evaluated for atrial flutter and acute on chronic diastolic congestive heart failure.  Assessment & Plan    1 Patient remains in atrial flutter versus coarse atrial fibrillation this morning.  Heart rate is controlled.  Continue IV amiodarone and metoprolol.  Continue Coumadin.  Plan is for TEE guided cardioversion tomorrow.  2 acute on chronic diastolic congestive heart failure-we will change Lasix to 40 mg IV daily.  Add spironolactone 25 mg daily.  Follow renal function.  CHF likely exacerbated by atrial arrhythmias.  3 status post recent mitral valve repair-continue SBE prophylaxis and Coumadin.  4 history of chronic stage II kidney disease-follow renal function with diuresis.  5 history of obstructive sleep apnea-needs CPAP  For questions or updates, please contact [place] Please consult [email or web address] for contact info under        Signed, Kirk Ruths, MD  12/26/2021, 7:47 AM

## 2021-12-26 NOTE — Plan of Care (Signed)
Nutrition Education Note  RD consulted for nutrition education regarding CHF. Pt states that he was provided with Heart Failure booklet yesterday which he has read over and has answered a lot of his questions. Pt reports eating fair amount of fast food and knows that he will need to limit this.  RD provided "Low Sodium Nutrition Therapy" handout from the Academy of [employer]. Reviewed patient's dietary recall. Provided examples on ways to decrease sodium intake in diet. Discouraged intake of processed foods and use of salt shaker. Encouraged fresh fruits and vegetables as well as whole grain sources of carbohydrates to maximize fiber intake.   RD discussed why it is important for patient to adhere to diet recommendations, and emphasized the role of fluids, foods to avoid, and importance of weighing self daily. Teach back method used.  Expect good compliance.  Current diet order is 2 gram sodium with 2 L fluid restriction, patient is consuming approximately 100% of meals at this time. Labs and medications reviewed. No further nutrition interventions warranted at this time. RD contact information provided. If additional nutrition issues arise, please re-consult RD.    Anthony Bryant, MS, RD, LDN Inpatient Clinical Dietitian Please see AMiON for contact information.

## 2021-12-26 NOTE — Plan of Care (Signed)
°  Problem: Education: Goal: Ability to demonstrate management of disease process will improve Outcome: Progressing   Problem: Activity: Goal: Capacity to carry out activities will improve Outcome: Progressing   Problem: Education: Goal: Knowledge of General Education information will improve Description: Including pain rating scale, medication(s)/side effects and non-pharmacologic comfort measures Outcome: Progressing   Problem: Health Behavior/Discharge Planning: Goal: Ability to manage health-related needs will improve Outcome: Progressing   Problem: Activity: Goal: Risk for activity intolerance will decrease Outcome: Progressing   Problem: Nutrition: Goal: Adequate nutrition will be maintained Outcome: Progressing

## 2021-12-26 NOTE — Progress Notes (Signed)
SATURATION QUALIFICATIONS: (This note is used to comply with regulatory documentation for home oxygen)  Patient Saturations on Room Air at Rest = 96%  Patient Saturations on Room Air while Ambulating = 91%  Patient Saturations on 0 Liters of oxygen while Ambulating = n/a%  Please briefly explain why patient needs home oxygen:

## 2021-12-27 ENCOUNTER — Inpatient Hospital Stay (HOSPITAL_COMMUNITY): Payer: Self-pay | Admitting: Anesthesiology

## 2021-12-27 ENCOUNTER — Other Ambulatory Visit: Payer: Self-pay

## 2021-12-27 ENCOUNTER — Other Ambulatory Visit (HOSPITAL_COMMUNITY): Payer: Self-pay

## 2021-12-27 ENCOUNTER — Inpatient Hospital Stay (HOSPITAL_COMMUNITY): Payer: Self-pay

## 2021-12-27 ENCOUNTER — Encounter (HOSPITAL_COMMUNITY)
Admission: EM | Disposition: A | Discharge status: Home / Self Care | Payer: Self-pay | Source: Home / Self Care | Attending: Internal Medicine

## 2021-12-27 ENCOUNTER — Encounter (HOSPITAL_COMMUNITY): Payer: Self-pay | Admitting: Internal Medicine

## 2021-12-27 DIAGNOSIS — I340 Nonrheumatic mitral (valve) insufficiency: Secondary | ICD-10-CM

## 2021-12-27 DIAGNOSIS — I3139 Other pericardial effusion (noninflammatory): Secondary | ICD-10-CM

## 2021-12-27 HISTORY — PX: CARDIOVERSION: SHX1299

## 2021-12-27 HISTORY — PX: TEE WITHOUT CARDIOVERSION: SHX5443

## 2021-12-27 LAB — BASIC METABOLIC PANEL
Anion gap: 11 (ref 5–15)
BUN: 15 mg/dL (ref 6–20)
CO2: 24 mmol/L (ref 22–32)
Calcium: 8.4 mg/dL — ABNORMAL LOW (ref 8.9–10.3)
Chloride: 100 mmol/L (ref 98–111)
Creatinine, Ser: 1.11 mg/dL (ref 0.61–1.24)
GFR, Estimated: >60 mL/min (ref >60)
Glucose, Bld: 88 mg/dL (ref 70–99)
Potassium: 4.8 mmol/L (ref 3.5–5.1)
Sodium: 135 mmol/L (ref 135–145)

## 2021-12-27 LAB — CBC
HCT: 38.9 % — ABNORMAL LOW (ref 39.0–52.0)
Hemoglobin: 12.1 g/dL — ABNORMAL LOW (ref 13.0–17.0)
MCH: 27.6 pg (ref 26.0–34.0)
MCHC: 31.1 g/dL (ref 30.0–36.0)
MCV: 88.6 fL (ref 80.0–100.0)
Platelets: 356 10*3/uL (ref 150–400)
RBC: 4.39 MIL/uL (ref 4.22–5.81)
RDW: 15.2 % (ref 11.5–15.5)
WBC: 8.3 10*3/uL (ref 4.0–10.5)
nRBC: 0 % (ref 0.0–0.2)

## 2021-12-27 LAB — ECHO TEE
MV M vel: 4.36 m/s
MV Peak grad: 76 mmHg
Radius: 0.5 cm

## 2021-12-27 LAB — PROTIME-INR
INR: 2.2 — ABNORMAL HIGH (ref 0.8–1.2)
Prothrombin Time: 24.1 seconds — ABNORMAL HIGH (ref 11.4–15.2)

## 2021-12-27 SURGERY — ECHOCARDIOGRAM, TRANSESOPHAGEAL
Anesthesia: Monitor Anesthesia Care

## 2021-12-27 MED ORDER — WARFARIN SODIUM 5 MG PO TABS: 5.0000 mg | ORAL_TABLET | Freq: Once | ORAL | Status: DC

## 2021-12-27 MED ORDER — PROPOFOL 10 MG/ML IV BOLUS
INTRAVENOUS | Status: DC | PRN
Start: 2021-12-27 — End: 2021-12-27
  Administered 2021-12-27: 10 mg via INTRAVENOUS
  Administered 2021-12-27: 20 mg via INTRAVENOUS

## 2021-12-27 MED ORDER — PROPOFOL 500 MG/50ML IV EMUL
INTRAVENOUS | Status: DC | PRN
  Administered 2021-12-27: 125 ug/kg/min via INTRAVENOUS

## 2021-12-27 MED ORDER — WARFARIN SODIUM 7.5 MG PO TABS
7.5000 mg | ORAL_TABLET | Freq: Once | ORAL | Status: AC
  Administered 2021-12-27: 7.5 mg via ORAL
  Filled 2021-12-27: qty 1

## 2021-12-27 MED ORDER — LIDOCAINE 2% (20 MG/ML) 5 ML SYRINGE
INTRAMUSCULAR | Status: DC | PRN
  Administered 2021-12-27: 100 mg via INTRAVENOUS

## 2021-12-27 NOTE — Interval H&P Note (Signed)
History and Physical Interval Note:  12/27/2021 12:06 PM  Anthony Byrd  has presented today for surgery, with the diagnosis of AFIB.  The various methods of treatment have been discussed with the patient and family. After consideration of risks, benefits and other options for treatment, the patient has consented to  Procedure(s): TRANSESOPHAGEAL ECHOCARDIOGRAM (TEE) (N/A) CARDIOVERSION (N/A) as a surgical intervention.  The patient's history has been reviewed, patient examined, no change in status, stable for surgery.  I have reviewed the patient's chart and labs.  Questions were answered to the patient's satisfaction.     Skeet Latch, MD

## 2021-12-27 NOTE — Anesthesia Postprocedure Evaluation (Signed)
Anesthesia Post Note  Patient: Anthony Byrd  Procedure(s) Performed: TRANSESOPHAGEAL ECHOCARDIOGRAM (TEE) CARDIOVERSION     Patient location during evaluation: Endoscopy Anesthesia Type: MAC Level of consciousness: awake and alert Pain management: pain level controlled Vital Signs Assessment: post-procedure vital signs reviewed and stable Respiratory status: spontaneous breathing and respiratory function stable Cardiovascular status: stable Postop Assessment: no apparent nausea or vomiting Anesthetic complications: no   No notable events documented.  Last Vitals:  Vitals:   12/27/21 1237 12/27/21 1247  BP: 104/71 (!) 104/43  Pulse: 72 73  Resp: 16 11  Temp:    SpO2: 99% 98%    Last Pain:  Vitals:   12/27/21 1247  TempSrc:   PainSc: 0-No pain                 Izabella Marcantel DANIEL

## 2021-12-27 NOTE — Anesthesia Preprocedure Evaluation (Addendum)
Anesthesia Evaluation  Patient identified by MRN, date of birth, ID band Patient awake    Reviewed: Allergy & Precautions, NPO status , Patient's Chart, lab work & pertinent test results  History of Anesthesia Complications Negative for: history of anesthetic complications  Airway Mallampati: III  TM Distance: >3 FB Neck ROM: Full    Dental no notable dental hx. (+) Dental Advisory Given   Pulmonary sleep apnea and Continuous Positive Airway Pressure Ventilation ,    Pulmonary exam normal        Cardiovascular +CHF and + DOE  + dysrhythmias Atrial Fibrillation + Valvular Problems/Murmurs (TR)  Rhythm:Irregular Rate:Tachycardia  S/P MV repair 1 week ago at [facility]   Neuro/Psych negative neurological ROS  negative psych ROS   GI/Hepatic negative GI ROS, Neg liver ROS,   Endo/Other  Morbid obesity  Renal/GU Renal disease (AKI)     Musculoskeletal negative musculoskeletal ROS (+)   Abdominal   Peds  Hematology  (+) Blood dyscrasia (Xarelto), ,   Anesthesia Other Findings Day of surgery medications reviewed with the patient.  Reproductive/Obstetrics                           Anesthesia Physical  Anesthesia Plan  ASA: 3  Anesthesia Plan: MAC   Post-op Pain Management:    Induction: Intravenous  PONV Risk Score and Plan: 1 and TIVA  Airway Management Planned: Natural Airway and Simple Face Mask  Additional Equipment:   Intra-op Plan:   Post-operative Plan:   Informed Consent: I have reviewed the patients History and Physical, chart, labs and discussed the procedure including the risks, benefits and alternatives for the proposed anesthesia with the patient or authorized representative who has indicated his/her understanding and acceptance.     Dental advisory given  Plan Discussed with: Anesthesiologist and CRNA  Anesthesia Plan Comments:        Anesthesia Quick  Evaluation

## 2021-12-27 NOTE — CV Procedure (Signed)
Brief TEE Note  LVEF 50-55% LAA has been surgically clipped.  Mitral valve annuloplasty ring well-seated.  Mild mitral regurgitation.   Moderate tricuspid regurgitation.  For additional details see full report.  Electrical Cardioversion Procedure Note Anthony Byrd [id number] Apr 03, 1971  Procedure: Electrical Cardioversion Indications:  Atrial Fibrillation  Procedure Details Consent: Risks of procedure as well as the alternatives and risks of each were explained to the (patient/caregiver).  Consent for procedure obtained. Time Out: Verified patient identification, verified procedure, site/side was marked, verified correct patient position, special equipment/implants available, medications/allergies/relevent history reviewed, required imaging and test results available.  Performed  Patient placed on cardiac monitor, pulse oximetry, supplemental oxygen as necessary.  Sedation given:  propofol Pacer pads placed anterior and posterior chest.  Cardioverted 1 time(s).  Cardioverted at [place].  Evaluation Findings: Post procedure EKG shows: NSR Complications: None Patient did tolerate procedure well.   Skeet Latch, MD 12/27/2021, 12:24 PM
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
## 2021-12-27 NOTE — H&P (View-Only) (Signed)
Progress Note  Patient Name: Anthony Byrd Date of Encounter: 12/27/2021  [facility] Cardiologist: [name], MD   Subjective   Denies dyspnea or CP  Inpatient Medications    Scheduled Meds:  furosemide  40 mg Intravenous Daily   metoprolol tartrate  25 mg Oral BID   potassium chloride SA  20 mEq Oral BID   sodium chloride flush  3 mL Intravenous Q12H   spironolactone  25 mg Oral Daily   Warfarin - Pharmacist Dosing Inpatient   Does not apply q1600   Continuous Infusions:  sodium chloride     sodium chloride     amiodarone 30 mg/hr (12/26/21 2110)   PRN Meds: sodium chloride, acetaminophen, benzonatate, guaiFENesin-dextromethorphan, HYDROcodone-acetaminophen, ondansetron (ZOFRAN) IV, polyethylene glycol, sodium chloride flush   Vital Signs    Vitals:   12/26/21 2325 12/27/21 0328 12/27/21 0613 12/27/21 0701  BP: 118/81 115/73  (!) 132/56  Pulse: 76 (!) 107  96  Resp: 16 15  16   Temp: 98.5 F (36.9 C) 98.1 F (36.7 C)  98.2 F (36.8 C)  TempSrc: Oral Oral  Oral  SpO2: 97% 93%    Weight:   125.8 kg     Intake/Output Summary (Last 24 hours) at 12/27/2021 0725 Last data filed at 12/27/2021 0000 Gross per 24 hour  Intake 1171.14 ml  Output 600 ml  Net 571.14 ml    Last 3 Weights 12/27/2021 12/26/2021 12/25/2021  Weight (lbs) 277 lb 5.4 oz 278 lb 279 lb 15.8 oz  Weight (kg) 125.8 kg 126.1 kg 127 kg      Telemetry    Atrial flutter vs coarse atrial fibrillation, rate controlled - Personally Reviewed  Physical Exam   GEN: Obese, NAD Neck: supple Cardiac: irregular, no murmur Respiratory: CTA GI: Soft, NT MS: 1+ edema Neuro:  Grossly intact Psych: Normal affect   Labs    Chemistry Recent Labs  Lab 12/23/21 1114 12/24/21 0639 12/25/21 0605 12/26/21 0018 12/27/21 0225  NA  --    < > 137 137 135  K  --    < > 4.1 4.1 4.8  CL  --    < > 99 101 100  CO2  --    < > 27 26 24   GLUCOSE  --    < > 88 108* 88  BUN  --    < > 13 13 15   CREATININE   --    < > 1.02 1.01 1.11  CALCIUM  --    < > 8.3* 8.4* 8.4*  MG 1.8  --  1.9  --   --   GFRNONAA  --    < > >60 >60 >60  ANIONGAP  --    < > 11 10 11    < > = values in this interval not displayed.      Hematology Recent Labs  Lab 12/25/21 0922 12/26/21 0018 12/27/21 0225  WBC 7.7 7.4 8.3  RBC 4.19* 4.26 4.39  HGB 12.0* 12.2* 12.1*  HCT 36.7* 37.7* 38.9*  MCV 87.6 88.5 88.6  MCH 28.6 28.6 27.6  MCHC 32.7 32.4 31.1  RDW 15.5 15.2 15.2  PLT 499* 385 356    Thyroid  Recent Labs  Lab 12/24/21 0639  TSH 1.444     BNP Recent Labs  Lab 12/23/21 1114  BNP 320.0*      Patient Profile     51 y.o. male with recent (12/15/21) mitral valve repair and maze procedure/LAA ligation for  persistent atrial fibrillation at [facility], chronic diastolic congestive heart failure, obstructive sleep apnea, morbid obesity, chronic stage II kidney disease, history of testicular cancer being evaluated for atrial flutter and acute on chronic diastolic congestive heart failure.  Assessment & Plan    1 Patient remains in atrial flutter versus coarse atrial fibrillation this morning.  Heart rate is controlled.  Continue IV amiodarone and metoprolol.  Continue Coumadin.  Plan to proceed with TEE guided cardioversion today.  2 acute on chronic diastolic congestive heart failure-volume status has improved since admission.  Continue Lasix and spironolactone at present dose.  Follow renal function.  CHF likely exacerbated by atrial arrhythmias.  3 status post recent mitral valve repair-continue SBE prophylaxis and Coumadin.  4 history of chronic stage II kidney disease-follow renal function with diuresis.  5 history of obstructive sleep apnea-needs CPAP  Potential discharge tomorrow if patient remains in sinus rhythm and CHF continues to improve.  For questions or updates, please contact [place] Please consult [email or web address] for contact info under        Signed, Kirk Ruths,  MD  12/27/2021, 7:25 AM

## 2021-12-27 NOTE — Progress Notes (Signed)
Progress Note  Patient Name: Anthony Byrd Date of Encounter: 12/27/2021  [facility] Cardiologist: [name], MD   Subjective   Denies dyspnea or CP  Inpatient Medications    Scheduled Meds:  furosemide  40 mg Intravenous Daily   metoprolol tartrate  25 mg Oral BID   potassium chloride SA  20 mEq Oral BID   sodium chloride flush  3 mL Intravenous Q12H   spironolactone  25 mg Oral Daily   Warfarin - Pharmacist Dosing Inpatient   Does not apply q1600   Continuous Infusions:  sodium chloride     sodium chloride     amiodarone 30 mg/hr (12/26/21 2110)   PRN Meds: sodium chloride, acetaminophen, benzonatate, guaiFENesin-dextromethorphan, HYDROcodone-acetaminophen, ondansetron (ZOFRAN) IV, polyethylene glycol, sodium chloride flush   Vital Signs    Vitals:   12/26/21 2325 12/27/21 0328 12/27/21 0613 12/27/21 0701  BP: 118/81 115/73  (!) 132/56  Pulse: 76 (!) 107  96  Resp: 16 15  16   Temp: 98.5 F (36.9 C) 98.1 F (36.7 C)  98.2 F (36.8 C)  TempSrc: Oral Oral  Oral  SpO2: 97% 93%    Weight:   125.8 kg     Intake/Output Summary (Last 24 hours) at 12/27/2021 0725 Last data filed at 12/27/2021 0000 Gross per 24 hour  Intake 1171.14 ml  Output 600 ml  Net 571.14 ml    Last 3 Weights 12/27/2021 12/26/2021 12/25/2021  Weight (lbs) 277 lb 5.4 oz 278 lb 279 lb 15.8 oz  Weight (kg) 125.8 kg 126.1 kg 127 kg      Telemetry    Atrial flutter vs coarse atrial fibrillation, rate controlled - Personally Reviewed  Physical Exam   GEN: Obese, NAD Neck: supple Cardiac: irregular, no murmur Respiratory: CTA GI: Soft, NT MS: 1+ edema Neuro:  Grossly intact Psych: Normal affect   Labs    Chemistry Recent Labs  Lab 12/23/21 1114 12/24/21 0639 12/25/21 0605 12/26/21 0018 12/27/21 0225  NA  --    < > 137 137 135  K  --    < > 4.1 4.1 4.8  CL  --    < > 99 101 100  CO2  --    < > 27 26 24   GLUCOSE  --    < > 88 108* 88  BUN  --    < > 13 13 15   CREATININE   --    < > 1.02 1.01 1.11  CALCIUM  --    < > 8.3* 8.4* 8.4*  MG 1.8  --  1.9  --   --   GFRNONAA  --    < > >60 >60 >60  ANIONGAP  --    < > 11 10 11    < > = values in this interval not displayed.      Hematology Recent Labs  Lab 12/25/21 0922 12/26/21 0018 12/27/21 0225  WBC 7.7 7.4 8.3  RBC 4.19* 4.26 4.39  HGB 12.0* 12.2* 12.1*  HCT 36.7* 37.7* 38.9*  MCV 87.6 88.5 88.6  MCH 28.6 28.6 27.6  MCHC 32.7 32.4 31.1  RDW 15.5 15.2 15.2  PLT 499* 385 356    Thyroid  Recent Labs  Lab 12/24/21 0639  TSH 1.444     BNP Recent Labs  Lab 12/23/21 1114  BNP 320.0*      Patient Profile     51 y.o. male with recent (12/15/21) mitral valve repair and maze procedure/LAA ligation for  persistent atrial fibrillation at [facility], chronic diastolic congestive heart failure, obstructive sleep apnea, morbid obesity, chronic stage II kidney disease, history of testicular cancer being evaluated for atrial flutter and acute on chronic diastolic congestive heart failure.  Assessment & Plan    1 Patient remains in atrial flutter versus coarse atrial fibrillation this morning.  Heart rate is controlled.  Continue IV amiodarone and metoprolol.  Continue Coumadin.  Plan to proceed with TEE guided cardioversion today.  2 acute on chronic diastolic congestive heart failure-volume status has improved since admission.  Continue Lasix and spironolactone at present dose.  Follow renal function.  CHF likely exacerbated by atrial arrhythmias.  3 status post recent mitral valve repair-continue SBE prophylaxis and Coumadin.  4 history of chronic stage II kidney disease-follow renal function with diuresis.  5 history of obstructive sleep apnea-needs CPAP  Potential discharge tomorrow if patient remains in sinus rhythm and CHF continues to improve.  For questions or updates, please contact [place] Please consult [email or web address] for contact info under        Signed, Kirk Ruths,  MD  12/27/2021, 7:25 AM

## 2021-12-27 NOTE — Transfer of Care (Signed)
Immediate Anesthesia Transfer of Care Note  Patient: Anthony Byrd  Procedure(s) Performed: TRANSESOPHAGEAL ECHOCARDIOGRAM (TEE) CARDIOVERSION  Patient Location: Endoscopy Unit  Anesthesia Type:General  Level of Consciousness: awake, alert  and oriented  Airway & Oxygen Therapy: Patient Spontanous Breathing and Patient connected to face mask oxygen  Post-op Assessment: Report given to RN and Post -op Vital signs reviewed and stable  Post vital signs: Reviewed and stable  Last Vitals:  Vitals Value Taken Time  BP 108/53 12/27/21 1227  Temp    Pulse 70 12/27/21 1229  Resp 23 12/27/21 1229  SpO2 100 % 12/27/21 1229  Vitals shown include unvalidated device data.  Last Pain:  Vitals:   12/27/21 1128  TempSrc: Temporal  PainSc: 0-No pain      Patients Stated Pain Goal: 2 (86/85/48 8301)  Complications: No notable events documented.

## 2021-12-27 NOTE — Plan of Care (Signed)
°  Problem: Education: Goal: Ability to demonstrate management of disease process will improve Outcome: Progressing Goal: Ability to verbalize understanding of medication therapies will improve Outcome: Progressing   Problem: Activity: Goal: Capacity to carry out activities will improve Outcome: Progressing   Problem: Education: Goal: Knowledge of General Education information will improve Description: Including pain rating scale, medication(s)/side effects and non-pharmacologic comfort measures Outcome: Progressing   Problem: Activity: Goal: Risk for activity intolerance will decrease Outcome: Progressing   Problem: Nutrition: Goal: Adequate nutrition will be maintained Outcome: Progressing   Problem: Coping: Goal: Level of anxiety will decrease Outcome: Progressing   Problem: Pain Managment: Goal: General experience of comfort will improve Outcome: Progressing   Problem: Safety: Goal: Ability to remain free from injury will improve Outcome: Progressing

## 2021-12-27 NOTE — TOC Progression Note (Addendum)
Transition of Care [facility]) - Progression Note    Patient Details  Name: Anthony Byrd MRN: [id number] Date of Birth: [birth year]  Transition of Care [facility] CM/SW [place], RN Phone Number:[phone]  12/27/2021, 1:12 PM  Clinical Narrative:    No consults have been entered patient is ambulating greater than 500 feet independently. Scripts should be sent to [place] upon discharge. Patient currently received meds through internal med funds and uses the outpatient pharmacy. Currently there are no TOC needs.        Expected Discharge Plan and Services                                                 Social Determinants of Health (SDOH) Interventions    Readmission Risk Interventions No flowsheet data found.

## 2021-12-28 ENCOUNTER — Other Ambulatory Visit (HOSPITAL_COMMUNITY): Payer: Self-pay

## 2021-12-28 ENCOUNTER — Encounter (HOSPITAL_COMMUNITY): Payer: Self-pay | Admitting: *Deleted

## 2021-12-28 DIAGNOSIS — N182 Chronic kidney disease, stage 2 (mild): Secondary | ICD-10-CM

## 2021-12-28 LAB — BASIC METABOLIC PANEL
Anion gap: 12 (ref 5–15)
BUN: 15 mg/dL (ref 6–20)
CO2: 23 mmol/L (ref 22–32)
Calcium: 8.8 mg/dL — ABNORMAL LOW (ref 8.9–10.3)
Chloride: 98 mmol/L (ref 98–111)
Creatinine, Ser: 1.1 mg/dL (ref 0.61–1.24)
GFR, Estimated: >60 mL/min (ref >60)
Glucose, Bld: 108 mg/dL — ABNORMAL HIGH (ref 70–99)
Potassium: 4.5 mmol/L (ref 3.5–5.1)
Sodium: 133 mmol/L — ABNORMAL LOW (ref 135–145)

## 2021-12-28 LAB — PROTIME-INR
INR: 2.4 — ABNORMAL HIGH (ref 0.8–1.2)
Prothrombin Time: 25.7 seconds — ABNORMAL HIGH (ref 11.4–15.2)

## 2021-12-28 LAB — CBC
HCT: 37.4 % — ABNORMAL LOW (ref 39.0–52.0)
Hemoglobin: 11.8 g/dL — ABNORMAL LOW (ref 13.0–17.0)
MCH: 27.7 pg (ref 26.0–34.0)
MCHC: 31.6 g/dL (ref 30.0–36.0)
MCV: 87.8 fL (ref 80.0–100.0)
Platelets: 378 10*3/uL (ref 150–400)
RBC: 4.26 MIL/uL (ref 4.22–5.81)
RDW: 15.3 % (ref 11.5–15.5)
WBC: 10.4 10*3/uL (ref 4.0–10.5)
nRBC: 0 % (ref 0.0–0.2)

## 2021-12-28 MED ORDER — BENZONATATE 200 MG PO CAPS
200.0000 mg | ORAL_CAPSULE | Freq: Three times a day (TID) | ORAL | 0 refills | Status: DC | PRN
  Filled 2021-12-28: qty 25, 9d supply, fill #0

## 2021-12-28 MED ORDER — AMIODARONE HCL 200 MG PO TABS
400.0000 mg | ORAL_TABLET | Freq: Every day | ORAL | Status: DC
  Administered 2021-12-28: 400 mg via ORAL
  Filled 2021-12-28: qty 2

## 2021-12-28 MED ORDER — WARFARIN SODIUM 5 MG PO TABS: 5.0000 mg | ORAL_TABLET | Freq: Once | ORAL | Status: DC

## 2021-12-28 MED ORDER — FUROSEMIDE 40 MG PO TABS
40.0000 mg | ORAL_TABLET | Freq: Every day | ORAL | 2 refills | Status: DC
  Filled 2021-12-28: qty 30, 30d supply, fill #0

## 2021-12-28 MED ORDER — HYDROMORPHONE HCL 2 MG PO TABS
2.0000 mg | ORAL_TABLET | Freq: Four times a day (QID) | ORAL | 0 refills | Status: AC | PRN
  Filled 2021-12-28: qty 28, 7d supply, fill #0

## 2021-12-28 MED ORDER — AMIODARONE HCL 200 MG PO TABS
ORAL_TABLET | ORAL | 2 refills | Status: DC
  Filled 2021-12-28: qty 35, 21d supply, fill #0

## 2021-12-28 MED ORDER — WARFARIN SODIUM 5 MG PO TABS
5.0000 mg | ORAL_TABLET | Freq: Every day | ORAL | 1 refills | Status: DC
  Filled 2021-12-28: qty 30, 30d supply, fill #0

## 2021-12-28 MED ORDER — FUROSEMIDE 40 MG PO TABS
40.0000 mg | ORAL_TABLET | Freq: Every day | ORAL | Status: DC
  Administered 2021-12-28: 40 mg via ORAL
  Filled 2021-12-28: qty 1

## 2021-12-28 MED ORDER — SPIRONOLACTONE 25 MG PO TABS
25.0000 mg | ORAL_TABLET | Freq: Every day | ORAL | 2 refills | Status: DC
  Filled 2021-12-28 – 2022-01-30 (×2): qty 30, 30d supply, fill #0
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
## 2021-12-28 NOTE — Progress Notes (Signed)
Received referral notification from Hiram Comber NP for this pt to participate in Cardiac rehab s/p MV Repair on 12/15/21.  Unable to use this as it is not signed by MD.  Reviewed medical history.  Noted that pt had recent admission  and discharge on yesterday.  Pt has cardiology upcoming appt post hospitalization on 2/21 and CVTS follow up 01/02/22.  Will need referral placed and 12 lead ekg completed. Will need verification of insurance status which will dictate which program he is eligible to participate. Virtual vs in person. Cherre Huger, BSN Cardiac and Training and development officer

## 2021-12-28 NOTE — Progress Notes (Signed)
Progress Note  Patient Name: Anthony Byrd Date of Encounter: 12/28/2021  [facility] Cardiologist: [name], MD   Subjective   Doing well with no dyspnea; chest pain with cough  Inpatient Medications    Scheduled Meds:  furosemide  40 mg Intravenous Daily   metoprolol tartrate  25 mg Oral BID   sodium chloride flush  3 mL Intravenous Q12H   spironolactone  25 mg Oral Daily   Warfarin - Pharmacist Dosing Inpatient   Does not apply q1600   Continuous Infusions:  sodium chloride     amiodarone 30 mg/hr (12/27/21 1342)   PRN Meds: sodium chloride, acetaminophen, benzonatate, guaiFENesin-dextromethorphan, HYDROcodone-acetaminophen, ondansetron (ZOFRAN) IV, polyethylene glycol, sodium chloride flush   Vital Signs    Vitals:   12/27/21 2341 12/28/21 0319 12/28/21 0437 12/28/21 0718  BP: 116/75 114/75  (!) 113/55  Pulse:  66  77  Resp:  14  17  Temp: 97.8 F (36.6 C) 97.6 F (36.4 C)  99.4 F (37.4 C)  TempSrc: Oral Oral    SpO2:  97%  90%  Weight:   126.1 kg     Intake/Output Summary (Last 24 hours) at 12/28/2021 0727 Last data filed at 12/28/2021 0400 Gross per 24 hour  Intake 1702.88 ml  Output 700 ml  Net 1002.88 ml    Last 3 Weights 12/28/2021 12/27/2021 12/26/2021  Weight (lbs) 278 lb 277 lb 5.4 oz 278 lb  Weight (kg) 126.1 kg 125.8 kg 126.1 kg      Telemetry    Sinus rhythm- Personally Reviewed  Physical Exam   GEN: Obese, WD Neck: JVP difficult Cardiac: RRR Respiratory: CTA; no wheeze GI: Soft, NT, no masses MS: 1+ edema Neuro:  No focal findings Psych: Normal affect   Labs    Chemistry Recent Labs  Lab 12/23/21 1114 12/24/21 0639 12/25/21 0605 12/26/21 0018 12/27/21 0225 12/28/21 0111  NA  --    < > 137 137 135 133*  K  --    < > 4.1 4.1 4.8 4.5  CL  --    < > 99 101 100 98  CO2  --    < > 27 26 24 23   GLUCOSE  --    < > 88 108* 88 108*  BUN  --    < > 13 13 15 15   CREATININE  --    < > 1.02 1.01 1.11 1.10  CALCIUM  --    <  > 8.3* 8.4* 8.4* 8.8*  MG 1.8  --  1.9  --   --   --   GFRNONAA  --    < > >60 >60 >60 >60  ANIONGAP  --    < > 11 10 11 12    < > = values in this interval not displayed.      Hematology Recent Labs  Lab 12/26/21 0018 12/27/21 0225 12/28/21 0111  WBC 7.4 8.3 10.4  RBC 4.26 4.39 4.26  HGB 12.2* 12.1* 11.8*  HCT 37.7* 38.9* 37.4*  MCV 88.5 88.6 87.8  MCH 28.6 27.6 27.7  MCHC 32.4 31.1 31.6  RDW 15.2 15.2 15.3  PLT 385 356 378    Thyroid  Recent Labs  Lab 12/24/21 0639  TSH 1.444     BNP Recent Labs  Lab 12/23/21 1114  BNP 320.0*      Patient Profile     51 y.o. male with recent (12/15/21) mitral valve repair and maze procedure/LAA ligation for persistent atrial  fibrillation at [facility], chronic diastolic congestive heart failure, obstructive sleep apnea, morbid obesity, chronic stage II kidney disease, history of testicular cancer being evaluated for atrial flutter and acute on chronic diastolic congestive heart failure.  Assessment & Plan    1 atrial flutter-patient is status post TEE guided cardioversion and remains in sinus rhythm.  We will change amiodarone to 400 mg by mouth daily for 1 week then 200 mg daily thereafter.  Continue metoprolol.  Continue Coumadin 5 mg daily.  2 acute on chronic diastolic congestive heart failure-CHF was felt to be exacerbated by atrial flutter.  His volume status has improved.  We will continue Lasix 40 mg daily and spironolactone 25 mg daily.  3 status post recent mitral valve repair-continue SBE prophylaxis and Coumadin.  4 history of chronic stage II kidney disease-check potassium and renal function 1 week following discharge.  5 history of obstructive sleep apnea-needs CPAP  Patient can be discharged today.  Check INR in Coumadin clinic on Friday.  Check potassium and renal function in 1 week.  Follow-up APP 2 to 4 weeks.  Follow-up Dr. Audie Box 8 to 12 weeks.  We will give prescription for Vicodin for pain following  recent mitral valve repair.  Greater than 30 minutes PA and physician time. D2  For questions or updates, please contact [place] Please consult [email or web address] for contact info under        Signed, Kirk Ruths, MD  12/28/2021, 7:27 AM

## 2021-12-28 NOTE — Discharge Summary (Signed)
Discharge Summary    Patient ID: Anthony Byrd MRN: [id number]; DOB: 04/22/1971  Admit date: 12/23/2021 Discharge date: 12/28/2021  PCP:  Timothy Lasso, MD   [facility] Providers Cardiologist:  [name], MD    Discharge Diagnoses    Principal Problem:   Atrial flutter [facility]) Active Problems:   Atrial fibrillation ([place])   Acute on chronic diastolic (congestive) heart failure (HCC)   Severe mitral regurgitation s/p MVR   Cough   CKD (chronic kidney disease), stage II    Diagnostic Studies/Procedures    TEE/DCCV 12/27/2021: Procedure: After discussion of the risks and benefits of a TEE, an  informed consent was obtained from the patient. The transesophogeal probe was passed without difficulty through the esophogus of the patient. Sedation performed by different physician. The patient was monitored while under deep sedation. Anesthestetic sedation was provided intravenously by Anesthesiology: 294mg  of Propofol,  100mg  of Lidocaine. The patient's vital signs; including heart rate, blood pressure, and oxygen saturation;  remained stable throughout the procedure. The patient developed no complications during the procedure. A successful direct current cardioversion was performed at 200 joules with 1 attempt.   Impressions:  1. Left ventricular ejection fraction, by estimation, is 50 to 55%. The left ventricle has low normal function. The left ventricle has no regional wall motion abnormalities.   2. Right ventricular systolic function is normal. The right ventricular size is normal.   3. Left atrial appendage is surgically absent. . No left atrial/left atrial appendage thrombus was detected.   4. A small pericardial effusion is present.   5. The mitral valve has been repaired/replaced. Mild mitral valve regurgitation. No evidence of mitral stenosis. The mean mitral valve gradient is 6.0 mmHg. Echo findings are consistent with normal structure and function of the mitral valve  prosthesis.   6. Tricuspid valve regurgitation is moderate.   7. The aortic valve is tricuspid. Aortic valve regurgitation is not visualized. No aortic stenosis is present.   8. The inferior vena cava is normal in size with greater than 50% respiratory variability, suggesting right atrial pressure of 3 mmHg.   Conclusion(s)/Recommendation(s): Normal biventricular function without evidence of hemodynamically significant valvular heart disease.  _____________   History of Present Illness     Anthony Byrd is a 51 y.o. male with a history of persistent atrial fibrillation, chronic diastolic CHF, mitral regurgitation s/p mitral valve repair on 12/15/2021, obstructive sleep apnea on CPAP, morbid obesity, and remote testicular cancer who was admitted on 12/23/2021 with atrial flutter with RVR.  Patient was recently admitted in 11/2021 with atrial fibrillation with RVR and acute diastolic CHF. Echo showed LVEF of 50-55%, moderate LVH, severe MR, and severely dilated left atrium. R/LHC showed normal coronaries and elevated right and left pressures. DCCV was attempted but was unsuccessful. He required significant IV diuresis and Milrinone and was loaded with IV Amiodarone. He was referred to [facility] for a mitral valve repair and underwent valvuloplasty and MAZE procedure on 12/15/2021. He was discharged from [facility] on 12/20/2021 in sinus rhythm. He was discharged on PO Amiodarone and Warfarin (due to inability to afford DOAC in the past).  Patient presented for INR check on 01/12/2022 and was noted to have elevated heart rate so he was sent to the ED. He reported some chest wall pain near surgical site when he coughs and had significant edema on exam but denied any shortness of breath, palpitations, or dizziness.  In the ED, he was found to be in rapid  atrial flutter. BNP elevated at 320. Chest x-ray showed mild edema. WBC 9.9, Hgb 12.6, Plts 363. Na 134, K 3.7, Glucose 114, Cr 1.23, BUN 13. TSH 1.44.  Hospital Course      Consultants: None  Paroxysmal Atrial Flutter with RVR History of Persistent Atrial Fibrillation S/p recent MAZE procedure at time of mitral valve repair. He presented in atrial flutter with RVR. Electrolytes and TSH normal. He was started on IV Amiodarone and then underwent TEE/DCCV on 12/27/2021 with restoration of sinus rhythm. Will discharge patient on PO Amiodarone 400mg  once daily for 1 week and then decrease to 200mg  daily, Lopressor 25mg  twice daily, and Coumadin 5mg  daily. He will have close follow-up in our Coumadin Clinic later this week.  Acute on Chronic Diastolic CHF Likely exacerbated by atrial arrhythmias. BNP elevated at 320 and chest x-ray showed mild edema. LVEF 50-55% on TEE. He was diuresed with IV Lasix. LVEF 50-55% on TEE. Will discharge patient on PO Lasix 40mg  daily (on 40mg  twice daily at home) and Spironolactone 25mg  daily (new this admission). Patient was on Amlodipine at home but BP has been mostly well controlled this admission while not on this. Will stop Amlodipine at discharge with addition of Spironolactone. Advised patient to monitor BP at home. Will recheck BMET in 1 week.   Severe Mitral Regurgitation s/p Recent Mitral Valve Repair S/p valvuloplasty on 12/15/2021 at [facility]. TEE this admission showed normal structure and function of mitral valve prothesis with mild MR and mean gradient of 6.0 mmHg. Continue Coumadin and SBE prophylaxis. He was discharged from [facility] with 7 day prescription of Dilaudid. He is still having post-op pain so we will refill this for 7 additional days per Dr. Stanford Breed. Advised patient that if he needs anymore, he will need to talk to CT Surgery at [facility].   CKD Stage II Creatinine stable this admission and 1.1 on day of discharge. Will recheck BMET in 1 week.  Obstructive Sleep Apnea Continue CPAP.  Cough Patient continues to have a cough. Will discharge patient on Tessalon Perles.   Patient seen and examined by Dr. Stanford Breed today and  determined to be stable for discharge. Outpatient follow-up arranged. Medications as below.  Did the patient have an acute coronary syndrome (MI, NSTEMI, STEMI, etc) this admission?:  No                               Did the patient have a percutaneous coronary intervention (stent / angioplasty)?:  No.    _____________  Discharge Vitals Blood pressure (!) 113/55, pulse 80, temperature 99.4 F (37.4 C), resp. rate (!) 35, weight 126.1 kg, SpO2 94 %.  Filed Weights   12/26/21 0440 12/27/21 0613 12/28/21 0437  Weight: 126.1 kg 125.8 kg 126.1 kg    Labs & Radiologic Studies    CBC Recent Labs    12/27/21 0225 12/28/21 0111  WBC 8.3 10.4  HGB 12.1* 11.8*  HCT 38.9* 37.4*  MCV 88.6 87.8  PLT 356 573   Basic Metabolic Panel Recent Labs    12/27/21 0225 12/28/21 0111  NA 135 133*  K 4.8 4.5  CL 100 98  CO2 24 23  GLUCOSE 88 108*  BUN 15 15  CREATININE 1.11 1.10  CALCIUM 8.4* 8.8*   Liver Function Tests No results for input(s): AST, ALT, ALKPHOS, BILITOT, PROT, ALBUMIN in the last 72 hours. No results for input(s): LIPASE, AMYLASE in the last 72 hours.  High Sensitivity Troponin:   No results for input(s): TROPONINIHS in the last 720 hours.  BNP Invalid input(s): POCBNP D-Dimer No results for input(s): DDIMER in the last 72 hours. Hemoglobin A1C No results for input(s): HGBA1C in the last 72 hours. Fasting Lipid Panel No results for input(s): CHOL, HDL, LDLCALC, TRIG, CHOLHDL, LDLDIRECT in the last 72 hours. Thyroid Function Tests No results for input(s): TSH, T4TOTAL, T3FREE, THYROIDAB in the last 72 hours.  Invalid input(s): FREET3 _____________  DG Chest 2 View  Result Date: 12/01/2021 CLINICAL DATA:  Chest pain for 2 days. EXAM: CHEST - 2 VIEW COMPARISON:  07/09/2020 FINDINGS: Both views are mildly degraded by patient body habitus. Midline trachea. Mild cardiomegaly. Trace left pleural thickening blunts the costophrenic angle. No pneumothorax. Moderate  pulmonary interstitial thickening. IMPRESSION: Given differences in technique, similar appearance of cardiomegaly and moderate pulmonary interstitial thickening. Favor mild interstitial edema. Electronically Signed   By: Abigail Miyamoto M.D.   On: 12/01/2021 10:51   CARDIAC CATHETERIZATION  Result Date: 12/05/2021   Hemodynamic findings consistent with severe pulmonary hypertension. No angiographic evidence of CAD Elevated right and left heart pressures. RA 17, RV 52/12/18, PA 61/29 mean 47, PCWP 33, LV 116/15/24, AO 111/85) Continue workup for mitral regurgitation and possible valve surgery. TEE planned later this week to assess valve and for DCCV. Continue diuresis as renal function allows.   US RENAL  Result Date: 12/09/2021 CLINICAL DATA:  51 year old male with painless hematuria. EXAM: RENAL / URINARY TRACT ULTRASOUND COMPLETE COMPARISON:  Noncontrast CT Abdomen and Pelvis 07/04/2020. FINDINGS: Right Kidney: Renal measurements: 14.1 x 7.3 x 7.8 cm = volume: 416 mL. Chronic extrarenal pelvis. Chronic renal cortical thinning. Shadowing renal staghorn calculus suspected on image 4, with staghorn calculi extending to the proximal renal pelvis on the 2021 CT. Mild if any right hydronephrosis. Left Kidney: Renal measurements: 16.5 x 7.9 x 8.0 cm = volume: 547 mL. Chronic left renal cortical thinning. No hydronephrosis, nephrolithiasis or left renal mass identified. Bladder: Diminutive. Appears normal for degree of bladder distention. Both ureteral jets identified with Doppler. Other: None. IMPRESSION: 1. Chronic Right Renal Staghorn Calculus. Mild if any associated right hydronephrosis at this time. 2. Underlying chronic renal cortical atrophy. 3. Unremarkable urinary bladder. Electronically Signed   By: Genevie Ann M.D.   On: 12/09/2021 11:55   US RENAL  Result Date: 12/02/2021 CLINICAL DATA:  Acute kidney injury. EXAM: RENAL / URINARY TRACT ULTRASOUND COMPLETE Difficult exam due to: Large size of patient.  COMPARISON:  Renal ultrasound 08/29/2019, CT abdomen and pelvis no contrast 07/04/2020 FINDINGS: Right Kidney: Renal measurements: 16.1 x 7.6 x 6.2 cm = volume: 394.44 mL. Echogenicity within normal limits with mild cortical thinning again noted. No mass or hydronephrosis visualized. There are multiple scattered collecting system calculi. Largest is in the upper pole measuring up to 2.6 cm. Left Kidney: Renal measurements: 15.8 x 7.7 x 6.3 cm = volume: 398.93 mL. Echogenicity within normal limits but again with mild cortical thinning. No mass, stone or hydronephrosis visualized. Bladder: Appears normal for degree of bladder distention. Ureteral jets were not evaluated for. Other: no free fluid is seen. IMPRESSION: 1. Nonobstructing right intrarenal stones. 2. Mild cortical thinning. 3. No increased cortical echogenicity or hydronephrosis. Electronically Signed   By: Telford Nab M.D.   On: 12/02/2021 00:51   DG Chest Port 1 View  Result Date: 12/23/2021 CLINICAL DATA:  afib w rvr EXAM: PORTABLE CHEST 1 VIEW COMPARISON:  12/09/2021 FINDINGS: Unchanged cardiomegaly. Prior median  sternotomy and mitral annuloplasty. Intact sternotomy wires. Fibular pads overlie the lower chest. There appears to be an appendage clip. Mild diffuse interstitial opacities. No large pleural effusion. No visible pneumothorax. Bilateral shoulder osteoarthritis. IMPRESSION: Postoperative chest with mild pulmonary edema. Unchanged cardiomegaly. Intact sternotomy wires. Electronically Signed   By: Maurine Simmering M.D.   On: 12/23/2021 12:06   DG CHEST PORT 1 VIEW  Result Date: 12/09/2021 CLINICAL DATA:  51 year old male PICC line placement. EXAM: PORTABLE CHEST 1 VIEW COMPARISON:  Portable chest 12/08/2021 and earlier. FINDINGS: Portable AP semi upright view at 1109 hours. New right upper extremity approach PICC line placed. Catheter tip at the cavoatrial junction level. Stable cardiac size and mediastinal contours. No pneumothorax. Stable  lung volumes. Regressed but not resolved bilateral basilar predominant pulmonary edema. No areas of worsening ventilation. IMPRESSION: 1. Right upper extremity approach PICC line placed, tip at the cavoatrial junction level. 2. Regressed but not resolved pulmonary edema. 3. No new cardiopulmonary abnormality. Electronically Signed   By: Genevie Ann M.D.   On: 12/09/2021 11:30   DG CHEST PORT 1 VIEW  Result Date: 12/08/2021 CLINICAL DATA:  Shortness of breath EXAM: PORTABLE CHEST 1 VIEW COMPARISON:  Film from earlier in the same day. FINDINGS: Cardiac shadow remains enlarged. Diffuse bilateral pulmonary edema is again identified similar to that seen on the prior exam. No effusion or pneumothorax is seen. No bony abnormality is noted. IMPRESSION: Stable diffuse pulmonary edema. Electronically Signed   By: Inez Catalina M.D.   On: 12/08/2021 20:09   DG CHEST PORT 1 VIEW  Result Date: 12/08/2021 CLINICAL DATA:  Reason for exam: acute respiratory failure with hypoxia Patient reports worsening sob since cardioversion yesterday. Hx of afib, multiple prior cardioversions. EXAM: PORTABLE CHEST - 1 VIEW COMPARISON:  the previous day's study FINDINGS: Worsening of bilateral diffuse infiltrates or edema with relative peripheral sparing in the the upper lobes. Heart size upper limits normal. Blunting of left lateral costophrenic angle suggesting small effusion. Visualized bones unremarkable. IMPRESSION: Worsening bilateral edema or infiltrates. Electronically Signed   By: Lucrezia Europe M.D.   On: 12/08/2021 08:02   DG CHEST PORT 1 VIEW  Result Date: 12/07/2021 CLINICAL DATA:  Shortness of breath EXAM: PORTABLE CHEST 1 VIEW COMPARISON:  Chest x-ray dated December 01, 2021 FINDINGS: Unchanged enlarged cardiac contours. Bilateral interstitial opacities. Small left pleural effusion. No evidence of pneumothorax. IMPRESSION: 1. Similar to slightly increased bilateral interstitial opacities which are likely due to pulmonary edema.  2. Unchanged small left pleural effusion. Electronically Signed   By: Yetta Glassman M.D.   On: 12/07/2021 10:26   ECHOCARDIOGRAM COMPLETE  Result Date: 12/02/2021    ECHOCARDIOGRAM REPORT   Patient Name:   Anthony Byrd Date of Exam: 12/02/2021 Medical Rec #:  [id number]      Height:       69.0 in Accession #:    [id number]     Weight:       295.0 lb Date of Birth:  [birth year]      BSA:          2.437 m Patient Age:    20 years       BP:           109/88 mmHg Patient Gender: M              HR:           108 bpm. Exam Location:  Inpatient Procedure: 2D Echo, Cardiac Doppler, Color Doppler and  Intracardiac            Opacification Agent Indications:    CHF/DOE/MR  History:        Patient has prior history of Echocardiogram examinations, most                 recent 10/05/2021. CHF; Arrythmias:Atrial Fibrillation. MR / TR                 / AFIB W RVR.  Sonographer:    Beryle Beams Referring Phys: [id number] [place]  1. Left ventricular ejection fraction, by estimation, is 50 to 55%. The left ventricle has low normal function. The left ventricle has no regional wall motion abnormalities. There is moderate concentric left ventricular hypertrophy. Diastolic function indeterminant due to Afib.  2. Right ventricular systolic function is mildly reduced. The right ventricular size is mildly enlarged. Tricuspid regurgitation signal is inadequate for assessing PA pressure.  3. Left atrial size was severely dilated.  4. Right atrial size was mildly dilated.  5. The mitral valve is moderately thickened and calcified with moderate-to-severe mitral annular calcification. Prior TTE in 2021 with A2/P2 prolapse which is difficult to appreciate on current study due to degree of leaflet calcification. There is central mitral regurgitation that is appears moderate vs moderate-to-severe, although, is underappreciated on color doppler  6. The aortic valve was not well visualized. There is moderate calcification of  the aortic valve. There is moderate thickening of the aortic valve. Aortic valve regurgitation is trivial. Mild aortic valve stenosis.  7. The inferior vena cava is dilated in size with <50% respiratory variability, suggesting right atrial pressure of 15 mmHg. FINDINGS  Left Ventricle: Left ventricular ejection fraction, by estimation, is 50 to 55%. The left ventricle has low normal function. The left ventricle has no regional wall motion abnormalities. Definity contrast agent was given IV to delineate the left ventricular endocardial borders. The left ventricular internal cavity size was normal in size. There is moderate concentric left ventricular hypertrophy. Diastolic function indeterminant due to Afib. Right Ventricle: The right ventricular size is mildly enlarged. Right vetricular wall thickness was not well visualized. Right ventricular systolic function is mildly reduced. Tricuspid regurgitation signal is inadequate for assessing PA pressure. Left Atrium: Left atrial size was severely dilated. Right Atrium: Right atrial size was mildly dilated. Pericardium: There is no evidence of pericardial effusion. Mitral Valve: The mitral valve is moderately thickened and calcified with moderate-to-severe mitral annular calcification. Prior TTE in 2021 with A2/P2 prolapse which is difficult to appreciate on current study due to degree of leaflet calcification. There is central mitral regurgitation that appears moderate, although, it is underappreciated on color doppler. The mitral valve is abnormal. There is moderate thickening of the mitral valve leaflet(s). There is moderate calcification of the mitral valve  leaflet(s). Moderate to severe mitral annular calcification. MV peak gradient, 111.5 mmHg. The mean mitral valve gradient is 77.0 mmHg. Tricuspid Valve: The tricuspid valve is normal in structure. Tricuspid valve regurgitation is trivial. Aortic Valve: The aortic valve was not well visualized. There is moderate  calcification of the aortic valve. There is moderate thickening of the aortic valve. Aortic valve regurgitation is trivial. Mild aortic stenosis is present. Aortic valve mean gradient measures 12.0 mmHg. Aortic valve peak gradient measures 22.8 mmHg. Aortic valve area, by VTI measures 1.59 cm. Pulmonic Valve: The pulmonic valve was not well visualized. Pulmonic valve regurgitation is trivial. Aorta: The aortic root is normal in size and structure. Venous: The inferior  vena cava is dilated in size with less than 50% respiratory variability, suggesting right atrial pressure of 15 mmHg. IAS/Shunts: The atrial septum is grossly normal.  LEFT VENTRICLE PLAX 2D LVIDd:         3.70 cm LVIDs:         2.80 cm LV PW:         1.60 cm LV IVS:        1.20 cm LVOT diam:     2.00 cm LV SV:         54 LV SV Index:   22 LVOT Area:     3.14 cm  LV Volumes (MOD) LV vol d, MOD A2C: 108.0 ml LV vol d, MOD A4C: 113.0 ml LV vol s, MOD A2C: 70.9 ml LV vol s, MOD A4C: 69.3 ml LV SV MOD A2C:     37.1 ml LV SV MOD A4C:     113.0 ml LV SV MOD BP:      40.8 ml RIGHT VENTRICLE             IVC RV S prime:     13.40 cm/s  IVC diam: 3.10 cm TAPSE (M-mode): 1.8 cm LEFT ATRIUM             Index        RIGHT ATRIUM           Index LA diam:        5.60 cm 2.30 cm/m   RA Area:     17.00 cm LA Vol (A2C):   80.5 ml 33.03 ml/m  RA Volume:   45.10 ml  18.51 ml/m LA Vol (A4C):   65.2 ml 26.75 ml/m LA Biplane Vol: 73.4 ml 30.12 ml/m  AORTIC VALVE                     PULMONIC VALVE AV Area (Vmax):    1.45 cm      PV Vmax:       0.31 m/s AV Area (Vmean):   1.40 cm      PV Peak grad:  0.4 mmHg AV Area (VTI):     1.59 cm AV Vmax:           239.00 cm/s AV Vmean:          161.000 cm/s AV VTI:            0.342 m AV Peak Grad:      22.8 mmHg AV Mean Grad:      12.0 mmHg LVOT Vmax:         110.00 cm/s LVOT Vmean:        71.600 cm/s LVOT VTI:          0.173 m LVOT/AV VTI ratio: 0.51  AORTA Ao Root diam: 2.30 cm Ao Asc diam:  2.70 cm MITRAL VALVE               TRICUSPID VALVE MV Area VTI:  0.43 cm    TV Peak grad:   32.9 mmHg MV Peak grad: 111.5 mmHg  TV Mean grad:   25.0 mmHg MV Mean grad: 77.0 mmHg   TV Vmax:        2.87 m/s MV Vmax:      5.28 m/s    TV Vmean:       238.0 cm/s MV Vmean:     420.0 cm/s  TV VTI:         0.75 msec  SHUNTS                           Systemic VTI:  0.17 m                           Systemic Diam: 2.00 cm Gwyndolyn Kaufman MD Electronically signed by Gwyndolyn Kaufman MD Signature Date/Time: 12/02/2021/12:15:15 PM    Final    ECHO TEE  Result Date: 12/27/2021    TRANSESOPHOGEAL ECHO REPORT   Patient Name:   Anthony Byrd Date of Exam: 12/27/2021 Medical Rec #:  [id number]      Height:       67.0 in Accession #:    [id number]     Weight:       277.3 lb Date of Birth:  [birth year]      BSA:          2.324 m Patient Age:    89 years       BP:           104/72 mmHg Patient Gender: M              HR:           90 bpm. Exam Location:  Inpatient Procedure: Transesophageal Echo, Color Doppler and Cardiac Doppler Indications:     I48.92* Unspecified atrial flutter  History:         Patient has prior history of Echocardiogram examinations, most                  recent 12/19/2021. CHF; Risk Factors:Sleep Apnea. Post MV repair                  with 32mm [employer] Semuform Ring, Maze, and LAA ligation at                  [employer].  Sonographer:     Raquel Sarna Senior RDCS Referring Phys:  [place] Diagnosing Phys: Skeet Latch MD PROCEDURE: After discussion of the risks and benefits of a TEE, an informed consent was obtained from the patient. The transesophogeal probe was passed without difficulty through the esophogus of the patient. Sedation performed by different physician. The patient was monitored while under deep sedation. Anesthestetic sedation was provided intravenously by Anesthesiology: 294mg  of Propofol, 100mg  of Lidocaine. The patient's vital signs; including heart rate, blood pressure, and oxygen saturation;  remained stable throughout the procedure. The patient developed no complications during the procedure. A successful direct current cardioversion was performed at 200 joules with 1 attempt. IMPRESSIONS  1. Left ventricular ejection fraction, by estimation, is 50 to 55%. The left ventricle has low normal function. The left ventricle has no regional wall motion abnormalities.  2. Right ventricular systolic function is normal. The right ventricular size is normal.  3. Left atrial appendage is surgically absent. . No left atrial/left atrial appendage thrombus was detected.  4. A small pericardial effusion is present.  5. The mitral valve has been repaired/replaced. Mild mitral valve regurgitation. No evidence of mitral stenosis. The mean mitral valve gradient is 6.0 mmHg. Echo findings are consistent with normal structure and function of the mitral valve prosthesis.  6. Tricuspid valve regurgitation is moderate.  7. The aortic valve is tricuspid. Aortic valve regurgitation is not visualized. No aortic stenosis is present.  8. The inferior vena cava is normal in size with greater than 50% respiratory variability, suggesting right  atrial pressure of 3 mmHg. Conclusion(s)/Recommendation(s): Normal biventricular function without evidence of hemodynamically significant valvular heart disease. FINDINGS  Left Ventricle: Left ventricular ejection fraction, by estimation, is 50 to 55%. The left ventricle has low normal function. The left ventricle has no regional wall motion abnormalities. The left ventricular internal cavity size was normal in size. There is no left ventricular hypertrophy. Right Ventricle: The right ventricular size is normal. No increase in right ventricular wall thickness. Right ventricular systolic function is normal. Left Atrium: Left atrial appendage is surgically absent. Left atrial size was normal in size. No left atrial/left atrial appendage thrombus was detected. Right Atrium: Right atrial size was  normal in size. Pericardium: A small pericardial effusion is present. Mitral Valve: The mitral valve has been repaired/replaced. Mild mitral valve regurgitation. There is a prosthetic annuloplasty ring present in the mitral position. Echo findings are consistent with normal structure and function of the mitral valve prosthesis. No evidence of mitral valve stenosis. MV peak gradient, 13.2 mmHg. The mean mitral valve gradient is 6.0 mmHg with average heart rate of 95 bpm. Tricuspid Valve: The tricuspid valve is normal in structure. Tricuspid valve regurgitation is moderate . No evidence of tricuspid stenosis. Aortic Valve: The aortic valve is tricuspid. Aortic valve regurgitation is not visualized. No aortic stenosis is present. Pulmonic Valve: The pulmonic valve was normal in structure. Pulmonic valve regurgitation is not visualized. No evidence of pulmonic stenosis. Aorta: The aortic root is normal in size and structure. Venous: The inferior vena cava is normal in size with greater than 50% respiratory variability, suggesting right atrial pressure of 3 mmHg. IAS/Shunts: No atrial level shunt detected by color flow Doppler.  MITRAL VALVE                  TRICUSPID VALVE MV Peak grad: 13.2 mmHg       TR Peak grad:   19.4 mmHg MV Mean grad: 6.0 mmHg        TR Vmax:        220.00 cm/s MV Vmax:      1.82 m/s MV Vmean:     106.0 cm/s MR Peak grad:    76.0 mmHg MR Mean grad:    48.0 mmHg MR Vmax:         436.00 cm/s MR Vmean:        321.0 cm/s MR PISA:         1.57 cm MR PISA Eff ROA: 14 mm MR PISA Radius:  0.50 cm Skeet Latch MD Electronically signed by Skeet Latch MD Signature Date/Time: 12/27/2021/5:53:32 PM    Final    ECHO TEE  Result Date: 12/07/2021    TRANSESOPHOGEAL ECHO REPORT   Patient Name:   Anthony Byrd Date of Exam: 12/07/2021 Medical Rec #:  [id number]      Height:       69.0 in Accession #:    [id number]     Weight:       290.5 lb Date of Birth:  November 27, 1970      BSA:          2.421 m Patient  Age:    57 years       BP:           130/82 mmHg Patient Gender: M              HR:           107 bpm. Exam Location:  Inpatient Procedure: Transesophageal Echo, Limited Color  Doppler, Cardiac Doppler and            Intracardiac Opacification Agent Indications:     mitral regurgitation  History:         Patient has prior history of Echocardiogram examinations, most                  recent 12/02/2021. CHF, Arrythmias:Atrial Fibrillation; Risk                  Factors:Sleep Apnea.  Sonographer:     Johny Chess RDCS Referring Phys:  [id number] PAULA V ROSS Diagnosing Phys: Fransico Him MD PROCEDURE: After discussion of the risks and benefits of a TEE, an informed consent was obtained from the patient. The transesophogeal probe was passed without difficulty through the esophogus of the patient. Imaged were obtained with the patient in a left lateral decubitus position. Sedation performed by different physician. The patient was monitored while under deep sedation. Anesthestetic sedation was provided intravenously by Anesthesiology: 746mg  of Propofol. The patient's vital signs; including heart rate, blood pressure, and oxygen saturation; remained stable throughout the procedure. The patient developed no complications during the procedure. A direct current cardioversion was performed. IMPRESSIONS  1. Left ventricular ejection fraction, by estimation, is 50 to 55%. The left ventricle has low normal function. The left ventricle demonstrates global hypokinesis.  2. Right ventricular systolic function is mildly reduced. The right ventricular size is mildly enlarged.  3. There is a nonmobile density in the mouth of the LA appendage. The LAA completely opacifies with definity contrast with no echolucent area corresponding to the area where the density was noted. This is consistent with artifact and not thrombus. . Left atrial size was severely dilated. No left atrial/left atrial appendage thrombus was detected. The LAA emptying  velocity was 60 cm/s.  4. Right atrial size was mildly dilated.  5. Degenerative MV with moderately thickened and midly calcified leaflets mainly at the leaflet base and moderate mitral annular calcification. 3D images were obtained. There is possible mild late systolic prolapse of the A2 segment. There is severe mitral regurgitation eccentrically directed towards the posterior wall with flow into the left upper pulmonary vein. There is intermittent flow reversal in the LUPV consistent with severe MR. Suspect MR is related to annular dilatation as well as possible A2 prolapse.  6. The aortic valve is calcified. There is moderate calcification of the aortic valve. Aortic valve regurgitation is not visualized. Moderate aortic valve stenosis. Planimetered AVA was 1.19cm2.  7. The inferior vena cava is normal in size with greater than 50% respiratory variability, suggesting right atrial pressure of 3 mmHg. Conclusion(s)/Recommendation(s):Low Normal LVF with EF 50-55%, no evidence of LA or LAA thrombus by definity contrast study, severe MR with evidence of intermittent flow reversal in ileft upper pulmonary vein, Moderate aortic stenosis. FINDINGS  Left Ventricle: Left ventricular ejection fraction, by estimation, is 50 to 55%. The left ventricle has low normal function. The left ventricle demonstrates global hypokinesis. Definity contrast agent was given IV to delineate the left ventricular endocardial borders. The left ventricular internal cavity size was normal in size. There is no left ventricular hypertrophy. Right Ventricle: The right ventricular size is mildly enlarged. No increase in right ventricular wall thickness. Right ventricular systolic function is mildly reduced. Left Atrium: There is a nonmobile density in the mouth of the LA appendage. The LAA completely opacifies with definity contrast with no echolucent area corresponding to the area where the density was noted. This is consistent  with artifact and  not thrombus. Left atrial size was severely dilated. No left atrial/left atrial appendage thrombus was detected. The LAA emptying velocity was 60 cm/s. Right Atrium: Right atrial size was mildly dilated. Pericardium: There is no evidence of pericardial effusion. Mitral Valve: Degenerative MV with moderately thickened and midly calcified leaflets mainly at the leaflet base and moderate mitral annular calcification. 3D images were obtained. There is possible mild prolapse of the A2 segment. There is severe mitral regurgitation eccentrically directed towards the posterior wall with flow into the left upper pulmonary vein. There is intermittent flow reversal in the LUPV consistent with severe MR. Suspect MR is related to annular dilatation as well as possible A2 prolapse. The mitral valve is degenerative in appearance. There is mild late systolic prolapse of the middle segment of the anterior leaflet of the mitral valve. Severe mitral valve regurgitation, with eccentric posteriorly directed jet. No evidence of mitral valve stenosis. Tricuspid Valve: The tricuspid valve is normal in structure. Tricuspid valve regurgitation is trivial. No evidence of tricuspid stenosis. Aortic Valve: Planimetered AVA was 1.19cm2. The aortic valve is calcified. There is moderate calcification of the aortic valve. Aortic valve regurgitation is not visualized. Moderate aortic stenosis is present. Pulmonic Valve: The pulmonic valve was normal in structure. Pulmonic valve regurgitation is not visualized. No evidence of pulmonic stenosis. Aorta: The aortic root is normal in size and structure. Venous: The inferior vena cava is normal in size with greater than 50% respiratory variability, suggesting right atrial pressure of 3 mmHg. IAS/Shunts: No atrial level shunt detected by color flow Doppler. Fransico Him MD Electronically signed by Fransico Him MD Signature Date/Time: 12/07/2021/7:58:24 PM    Final    [place] EKG SITE RITE  Result Date:  12/09/2021 If Site Rite image not attached, placement could not be confirmed due to current cardiac rhythm.  US Abdomen Limited RUQ (LIVER/GB)  Result Date: 12/04/2021 CLINICAL DATA:  Abdominal pain EXAM: ULTRASOUND ABDOMEN LIMITED RIGHT UPPER QUADRANT COMPARISON:  07/04/2020 FINDINGS: Gallbladder: Partially contracted. No pericholecystic fluid is noted. Negative sonographic Murphy's sign is elicited. Common bile duct: Diameter: 5 mm. Liver: Increased in echogenicity without focal mass. Portal vein is patent on color Doppler imaging with normal direction of blood flow towards the liver. Other: None. IMPRESSION: Fatty infiltration of the liver. No acute abnormality is noted. Electronically Signed   By: Inez Catalina M.D.   On: 12/04/2021 19:27   Disposition   Patient is being discharged home today in good condition.  Follow-up Plans & Appointments     Follow-up Information     [facility], [place] Follow up.   Specialties: Cardiology, Radiology Why: Hospital follow-up with Cardiology scheduled for 01/10/2022 at 10:05am. Please arrive 15 minutes early for check-in. If this date/time does not work for you, please call our office to reschedule. Contact information: [address] [place] [address] [phone]         [facility] Follow up.   Specialty: Cardiology Why: Appointment in [facility] scheduled for Friday 12/30/2021 at 2:30pm. Please arrive 15 minutes early for check-in. Contact information: [address] [place] [facility] Follow up.   Specialty: Cardiology Why: Please come by our office in 1 week (on 01/04/2022) for a lab visit so that we can recheck your kidney function and electrolytes. You can come by anytime from 8am to 4:30pm (lab is typically closed around 1-2pm for lunch).  Contact information: [address] [place]  [place] [phone]               Discharge Instructions     Diet - low sodium heart healthy   Complete by: As directed    Increase activity slowly   Complete by: As directed        Discharge Medications   Allergies as of 12/28/2021       Reactions   Peanut-containing Drug Products Nausea And Vomiting   Pt reports tolerance to small amount of peanut now        Medication List     STOP taking these medications    amLODipine 5 MG tablet Commonly known as: NORVASC   potassium chloride SA 20 MEQ tablet Commonly known as: KLOR-CON M       TAKE these medications    acetaminophen 500 MG tablet Commonly known as: TYLENOL Take 1,000 mg by mouth every 6 (six) hours as needed for mild pain.   amiodarone 200 MG tablet Commonly known as: PACERONE Take 2 tablets (400mg ) once daily for 1 week and then decrease to 1 tablet (200mg ) once daily. What changed:  how much to take how to take this when to take this additional instructions   benzonatate 200 MG capsule Commonly known as: TESSALON Take 1 capsule (200 mg total) by mouth 3 (three) times daily as needed for cough.   furosemide 40 MG tablet Commonly known as: LASIX Take 1 tablet (40 mg total) by mouth daily What changed:  when to take this additional instructions   HYDROmorphone 2 MG tablet Commonly known as: DILAUDID Take 1 tablet (2 mg total) by mouth every 6 (six) hours as needed for up to 7 days. What changed: reasons to take this   metoprolol tartrate 25 MG tablet Commonly known as: LOPRESSOR Take 25 mg by mouth 2 (two) times daily.   polyethylene glycol powder 17 GM/SCOOP powder Commonly known as: GLYCOLAX/MIRALAX Take 17 g by mouth daily as needed for constipation.   spironolactone 25 MG tablet Commonly known as: ALDACTONE Take 1 tablet (25 mg total) by mouth daily.   warfarin 5 MG tablet Commonly known as: COUMADIN Take 1 tablet (5 mg total) by mouth at bedtime. What changed:  how  much to take additional instructions           Outstanding Labs/Studies   Repeat INR on 12/30/2021. Repeat BMET in 1 week.  Duration of Discharge Encounter   Greater than 30 minutes including physician time.  Signed, Darreld Mclean, PA-C 12/28/2021, 9:12 AM

## 2021-12-29 ENCOUNTER — Other Ambulatory Visit: Payer: Self-pay

## 2021-12-29 ENCOUNTER — Encounter (HOSPITAL_COMMUNITY): Payer: Self-pay | Admitting: Cardiovascular Disease

## 2021-12-29 DIAGNOSIS — I4891 Unspecified atrial fibrillation: Secondary | ICD-10-CM

## 2021-12-30 ENCOUNTER — Ambulatory Visit (INDEPENDENT_AMBULATORY_CARE_PROVIDER_SITE_OTHER): Payer: Self-pay

## 2021-12-30 ENCOUNTER — Other Ambulatory Visit: Payer: Self-pay

## 2021-12-30 ENCOUNTER — Other Ambulatory Visit (HOSPITAL_COMMUNITY): Payer: Self-pay

## 2021-12-30 ENCOUNTER — Encounter: Payer: Self-pay | Admitting: Internal Medicine

## 2021-12-30 DIAGNOSIS — Z7901 Long term (current) use of anticoagulants: Secondary | ICD-10-CM | POA: Insufficient documentation

## 2021-12-30 LAB — POCT INR: INR: 3.9 — AB (ref 2.0–3.0)

## 2021-12-30 NOTE — Patient Instructions (Signed)
HOLD TONIGHT and Saturday then decrease to 1 tablet Daily, except 0.5 tablet Monday, Wednesday and Friday.  INR in 1 week.  [phone]  A full discussion of the nature of anticoagulants has been carried out.  A benefit risk analysis has been presented to the patient, so that they understand the justification for choosing anticoagulation at this time. The need for frequent and regular monitoring, precise dosage adjustment and compliance is stressed.  Side effects of potential bleeding are discussed.  The patient should avoid any OTC items containing aspirin or ibuprofen, and should avoid great swings in general diet.  Avoid alcohol consumption.  Call if any signs of abnormal bleeding.

## 2022-01-03 ENCOUNTER — Encounter: Payer: Self-pay | Admitting: Cardiovascular Disease

## 2022-01-03 NOTE — Telephone Encounter (Signed)
Spoke  to patient . Informed patient of Dr Audie Box  response.  Informed patient there is a  way to  have INR tested while he is  helping his sister . She lives in [place].   RN  sates he will need to discuss with anticoag clinic to make arrangements.  Patient has an appointment with [facility] PA tomorrow . Can discuss at that time.

## 2022-01-06 ENCOUNTER — Other Ambulatory Visit: Payer: Self-pay

## 2022-01-06 ENCOUNTER — Ambulatory Visit (INDEPENDENT_AMBULATORY_CARE_PROVIDER_SITE_OTHER): Payer: Self-pay

## 2022-01-06 DIAGNOSIS — I4891 Unspecified atrial fibrillation: Secondary | ICD-10-CM

## 2022-01-06 DIAGNOSIS — Z7901 Long term (current) use of anticoagulants: Secondary | ICD-10-CM

## 2022-01-06 LAB — POCT INR: INR: 2.1 (ref 2.0–3.0)

## 2022-01-06 NOTE — Telephone Encounter (Signed)
Spoke to patient he stated he had valve surgery 3 weeks ago.Stated he noticed numbness in chest at times for the past week.No pain.Patient reassured.Advised to keep post hospital appointment with Almyra Deforest PA 01/10/22 at 10:00 am.

## 2022-01-06 NOTE — Telephone Encounter (Signed)
LMTCB

## 2022-01-06 NOTE — Patient Instructions (Signed)
Continue 1 tablet Daily, except 0.5 tablet Monday, Wednesday and Friday.  INR in 1 week.  [phone]

## 2022-01-09 NOTE — H&P (View-Only) (Signed)
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 [email or web address].    Your next appointment:   2-3 month(s)  The format for your next appointment:   In Person  Provider:   [name], MD     Other Instructions             PATIENT INSTRUCTIONS    CARDIOVERSION/TEE [place]     [place]   Patient Name: Anthony Byrd  Diagnosis: No diagnosis  found.  DATE AND TIME OF PROCEDURE: Thursday 01/12/22 at 12 noon.  Please register at the Admitting Department at 11 AM.  DO NOT EAT OR DRINK ANYTHING after midnight prior to your procedure.  You should take your medications as usual with a sip of water.  If you are diabetic, DO NOT TAKE YOUR DIABETIC MEDICATIONS the morning OF THE PROCEDURE.  If you are taking Lanoxin (also called Digoxin), do NOT take this medication the morning of the procedure.  DO NOT STOP any blood thinners that you may be taking.  Have your blood drawn as intructed.  You will need someone with you to drive you  home after the procedure.  If you have any questions, please call our office at [phone].     For your  leg edema you  should do  the following 1. Leg elevation - I recommend the Lounge Dr. Leg rest.  See below for details  2. Salt restriction  -  Use potassium chloride instead of regular salt as a salt substitute. 3. Walk regularly 4. Compression hose - [facility] [employer] [facility]  5. Weight loss    Available on [email or web address] Or  Go to [email or web address]       Signed, Emmaline Life, NP  01/10/2022 4:25 PM    [facility]

## 2022-01-09 NOTE — Progress Notes (Signed)
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 MyChart, go to [email or web address].    Your next appointment:   2-3 month(s)  The format for your next appointment:   In Person  Provider:   [name], MD     Other Instructions             PATIENT INSTRUCTIONS    CARDIOVERSION/TEE [place] "A"     [place]   Patient Name: Anthony Byrd  Diagnosis: No diagnosis  found.  DATE AND TIME OF PROCEDURE: Thursday 01/12/22 at 12 noon.  Please register at the Admitting Department at 11 AM.  DO NOT EAT OR DRINK ANYTHING after midnight prior to your procedure.  You should take your medications as usual with a sip of water.  If you are diabetic, DO NOT TAKE YOUR DIABETIC MEDICATIONS the morning OF THE PROCEDURE.  If you are taking Lanoxin (also called Digoxin), do NOT take this medication the morning of the procedure.  DO NOT STOP any blood thinners that you may be taking.  Have your blood drawn as intructed.  You will need someone with you to drive you  home after the procedure.  If you have any questions, please call our office at [phone].     For your  leg edema you  should do  the following 1. Leg elevation - I recommend the Lounge Dr. Leg rest.  See below for details  2. Salt restriction  -  Use potassium chloride instead of regular salt as a salt substitute. 3. Walk regularly 4. Compression hose - Medical Supply store  5. Weight loss    Available on [email or web address] Or  Go to [email or web address]       Signed, Emmaline Life, NP  01/10/2022 4:25 PM    [facility]

## 2022-01-10 ENCOUNTER — Ambulatory Visit (INDEPENDENT_AMBULATORY_CARE_PROVIDER_SITE_OTHER): Payer: Self-pay | Admitting: *Deleted

## 2022-01-10 ENCOUNTER — Encounter: Payer: Self-pay | Admitting: Nurse Practitioner

## 2022-01-10 ENCOUNTER — Other Ambulatory Visit: Payer: Self-pay

## 2022-01-10 ENCOUNTER — Ambulatory Visit (INDEPENDENT_AMBULATORY_CARE_PROVIDER_SITE_OTHER): Payer: Self-pay | Admitting: Nurse Practitioner

## 2022-01-10 VITALS — BP 129/88 | HR 132 | Ht 68.0 in | Wt 266.8 lb

## 2022-01-10 DIAGNOSIS — I340 Nonrheumatic mitral (valve) insufficiency: Secondary | ICD-10-CM

## 2022-01-10 DIAGNOSIS — R051 Acute cough: Secondary | ICD-10-CM

## 2022-01-10 DIAGNOSIS — Z5181 Encounter for therapeutic drug level monitoring: Secondary | ICD-10-CM

## 2022-01-10 DIAGNOSIS — I5033 Acute on chronic diastolic (congestive) heart failure: Secondary | ICD-10-CM

## 2022-01-10 DIAGNOSIS — I484 Atypical atrial flutter: Secondary | ICD-10-CM

## 2022-01-10 DIAGNOSIS — I4891 Unspecified atrial fibrillation: Secondary | ICD-10-CM

## 2022-01-10 DIAGNOSIS — Z7901 Long term (current) use of anticoagulants: Secondary | ICD-10-CM

## 2022-01-10 LAB — POCT INR: INR: 1.8 — AB (ref 2.0–3.0)

## 2022-01-10 MED ORDER — FUROSEMIDE 40 MG PO TABS: 40.0000 mg | ORAL_TABLET | Freq: Two times a day (BID) | ORAL | 3 refills | Status: DC

## 2022-01-10 NOTE — Patient Instructions (Signed)
Medication Instructions:  Lopressor 25 mg twice daily  *If you need a refill on your cardiac medications before your next appointment, please call your pharmacy*   Lab Work: Your physician recommends that you complete lab today BMET CBC   Testing: Your physician has requested that you have a TEE/Cardioversion. During a TEE, sound waves are used to create images of your heart. It provides your doctor with information about the size and shape of your heart and how well your hearts chambers and valves are working. In this test, a transducer is attached to the end of a flexible tube that is guided down you throat and into your esophagus (the tube leading from your mouth to your stomach) to get a more detailed image of your heart. Once the TEE has determined that a blood clot is not present, the cardioversion begins. Electrical Cardioversion uses a jolt of electricity to your heart either through paddles or wired patches attached to your chest. This is a controlled, usually prescheduled, procedure. This procedure is done at the hospital and you are not awake during the procedure. You usually go home the day of the procedure. Please see the instruction sheet given to you today for more information.    Follow-Up: At [facility], you and your health needs are our priority.  As part of our continuing mission to provide you with exceptional heart care, we have created designated Provider Care Teams.  These Care Teams include your primary Cardiologist (physician) and Advanced Practice Providers (APPs -  Physician Assistants and Nurse Practitioners) who all work together to provide you with the care you need, when you need it.  We recommend signing up for the patient portal called "MyChart".  Sign up information is provided on this After Visit Summary.  MyChart is used to connect with patients for Virtual Visits (Telemedicine).  Patients are able to view lab/test results, encounter notes, upcoming  appointments, etc.  Non-urgent messages can be sent to your provider as well.   To learn more about what you can do with MyChart, go to [email or web address].    Your next appointment:   2-3 month(s)  The format for your next appointment:   In Person  Provider:   [name], MD     Other Instructions             PATIENT INSTRUCTIONS    CARDIOVERSION/TEE [place] "A"     Anthony Byrd   Patient Name: Anthony Byrd  Diagnosis: No diagnosis found.  DATE AND TIME OF PROCEDURE: Thursday 01/12/22 at 12 noon.  Please register at the Admitting Department at 11 AM.  DO NOT EAT OR DRINK ANYTHING after midnight prior to your procedure.  You should take your medications as usual with a sip of water.  If you are diabetic, DO NOT TAKE YOUR DIABETIC MEDICATIONS the morning OF THE PROCEDURE.  If you are taking Lanoxin (also called Digoxin), do NOT take this medication the morning of the procedure.  DO NOT STOP any blood thinners that you may be taking.  Have your blood drawn as intructed.  You will need someone with you to drive you home after the procedure.  If you have any questions, please call our office at [phone].     For your  leg edema you  should do  the following 1. Leg elevation - I recommend the Lounge Dr. Leg rest.  See below for details  2. Salt restriction  -  Use potassium chloride instead of regular salt as a salt substitute. 3. Walk regularly 4. Compression hose - Medical Supply store  5. Weight loss    Available on [email or web address] Or  Go to [email or web address]

## 2022-01-10 NOTE — Patient Instructions (Signed)
Description   START taking warfarin 1 tablet daily except for 1/2 a tablet on Mondays and Fridays. Recheck INR in 1 week. Coumadin Clinic [phone]

## 2022-01-11 LAB — BASIC METABOLIC PANEL
BUN/Creatinine Ratio: 27 — ABNORMAL HIGH (ref 9–20)
BUN: 29 mg/dL — ABNORMAL HIGH (ref 6–24)
CO2: 22 mmol/L (ref 20–29)
Calcium: 9.3 mg/dL (ref 8.7–10.2)
Chloride: 98 mmol/L (ref 96–106)
Creatinine, Ser: 1.08 mg/dL (ref 0.76–1.27)
Glucose: 99 mg/dL (ref 70–99)
Potassium: 4.5 mmol/L (ref 3.5–5.2)
Sodium: 140 mmol/L (ref 134–144)
eGFR: 83 mL/min/{1.73_m2} (ref >59)

## 2022-01-11 LAB — CBC
Hematocrit: 42 % (ref 37.5–51.0)
Hemoglobin: 13 g/dL (ref 13.0–17.7)
MCH: 26.1 pg — ABNORMAL LOW (ref 26.6–33.0)
MCHC: 31 g/dL — ABNORMAL LOW (ref 31.5–35.7)
MCV: 84 fL (ref 79–97)
Platelets: 403 10*3/uL (ref 150–450)
RBC: 4.99 x10E6/uL (ref 4.14–5.80)
RDW: 14.7 % (ref 11.6–15.4)
WBC: 7.9 10*3/uL (ref 3.4–10.8)

## 2022-01-11 NOTE — Addendum Note (Signed)
Addended by: Emmaline Life on: 01/11/2022 05:06 PM   Modules accepted: Orders

## 2022-01-12 ENCOUNTER — Encounter (HOSPITAL_COMMUNITY): Payer: Self-pay | Admitting: Cardiology

## 2022-01-12 ENCOUNTER — Ambulatory Visit (HOSPITAL_COMMUNITY)
Admission: RE | Admit: 2022-01-12 | Discharge: 2022-01-12 | Disposition: A | Discharge status: Home / Self Care | Payer: Self-pay | Attending: Cardiology | Admitting: Cardiology

## 2022-01-12 ENCOUNTER — Ambulatory Visit (HOSPITAL_COMMUNITY): Payer: Self-pay | Admitting: Anesthesiology

## 2022-01-12 ENCOUNTER — Other Ambulatory Visit: Payer: Self-pay

## 2022-01-12 ENCOUNTER — Ambulatory Visit (INDEPENDENT_AMBULATORY_CARE_PROVIDER_SITE_OTHER): Payer: Self-pay | Admitting: Internal Medicine

## 2022-01-12 ENCOUNTER — Encounter (HOSPITAL_COMMUNITY)
Admission: RE | Disposition: A | Discharge status: Home / Self Care | Payer: Self-pay | Source: Home / Self Care | Attending: Cardiology

## 2022-01-12 DIAGNOSIS — Z7901 Long term (current) use of anticoagulants: Secondary | ICD-10-CM | POA: Insufficient documentation

## 2022-01-12 DIAGNOSIS — I484 Atypical atrial flutter: Secondary | ICD-10-CM

## 2022-01-12 DIAGNOSIS — I700 Atherosclerosis of aorta: Secondary | ICD-10-CM | POA: Insufficient documentation

## 2022-01-12 DIAGNOSIS — I4891 Unspecified atrial fibrillation: Secondary | ICD-10-CM | POA: Insufficient documentation

## 2022-01-12 DIAGNOSIS — I081 Rheumatic disorders of both mitral and tricuspid valves: Secondary | ICD-10-CM | POA: Insufficient documentation

## 2022-01-12 DIAGNOSIS — Z6841 Body Mass Index (BMI) 40.0 and over, adult: Secondary | ICD-10-CM | POA: Insufficient documentation

## 2022-01-12 DIAGNOSIS — I5042 Chronic combined systolic (congestive) and diastolic (congestive) heart failure: Secondary | ICD-10-CM | POA: Insufficient documentation

## 2022-01-12 DIAGNOSIS — Z79899 Other long term (current) drug therapy: Secondary | ICD-10-CM | POA: Insufficient documentation

## 2022-01-12 DIAGNOSIS — Z09 Encounter for follow-up examination after completed treatment for conditions other than malignant neoplasm: Secondary | ICD-10-CM | POA: Insufficient documentation

## 2022-01-12 DIAGNOSIS — G4730 Sleep apnea, unspecified: Secondary | ICD-10-CM | POA: Insufficient documentation

## 2022-01-12 LAB — PROTIME-INR
INR: 1.5 — ABNORMAL HIGH (ref 0.8–1.2)
Prothrombin Time: 18.1 seconds — ABNORMAL HIGH (ref 11.4–15.2)

## 2022-01-12 SURGERY — CANCELLED PROCEDURE
Anesthesia: General

## 2022-01-12 NOTE — Interval H&P Note (Signed)
History and Physical Interval Note:  01/12/2022 10:52 AM  Anthony Byrd  has presented today for surgery, with the diagnosis of afib.  The various methods of treatment have been discussed with the patient and family. After consideration of risks, benefits and other options for treatment, the patient has consented to  Procedure(s): CARDIOVERSION (N/A) as a surgical intervention.  The patient's history has been reviewed, patient examined, no change in status, stable for surgery.  I have reviewed the patient's chart and labs.  Questions were answered to the patient's satisfaction.    Dr. Audie Box has reviewed the patient's case prior to scheduling. 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 As long as INR today is therapeutic, it is reasonable to proceed with cardioversion only. If INR <2, there is risk post cardioversion to thrombus development, so would hold cardioversion or will need to bridge with lovenox. Options discussed with patient.   Anthony Byrd

## 2022-01-12 NOTE — Anesthesia Preprocedure Evaluation (Signed)
Anesthesia Evaluation  Patient identified by MRN, date of birth, ID band Patient awake    Reviewed: Allergy & Precautions, NPO status , Patient's Chart, lab work & pertinent test results  History of Anesthesia Complications Negative for: history of anesthetic complications  Airway Mallampati: III  TM Distance: >3 FB Neck ROM: Full    Dental no notable dental hx. (+) Dental Advisory Given   Pulmonary sleep apnea and Continuous Positive Airway Pressure Ventilation ,    Pulmonary exam normal        Cardiovascular +CHF and + DOE  + dysrhythmias Atrial Fibrillation + Valvular Problems/Murmurs (TR)  Rhythm:Irregular Rate:Tachycardia  S/P MV repair at [facility]   Neuro/Psych negative neurological ROS  negative psych ROS   GI/Hepatic negative GI ROS, Neg liver ROS,   Endo/Other  Morbid obesity  Renal/GU Renal disease (AKI)     Musculoskeletal negative musculoskeletal ROS (+)   Abdominal   Peds  Hematology  (+) Blood dyscrasia (Xarelto), ,   Anesthesia Other Findings Day of surgery medications reviewed with the patient.  Reproductive/Obstetrics                             Anesthesia Physical  Anesthesia Plan  ASA: 3  Anesthesia Plan: General   Post-op Pain Management: Minimal or no pain anticipated   Induction: Intravenous  PONV Risk Score and Plan: 1 and TIVA  Airway Management Planned: Natural Airway, Simple Face Mask and Mask  Additional Equipment: None  Intra-op Plan:   Post-operative Plan:   Informed Consent: I have reviewed the patients History and Physical, chart, labs and discussed the procedure including the risks, benefits and alternatives for the proposed anesthesia with the patient or authorized representative who has indicated his/her understanding and acceptance.     Dental advisory given  Plan Discussed with: Anesthesiologist and CRNA  Anesthesia Plan Comments:          Anesthesia Quick Evaluation

## 2022-01-12 NOTE — OR Nursing (Signed)
INR 1.5, Dr Harrell Gave gave patients options, try lovenox or increase coumadin. Patient choosing to cancel CV today, increase coumadin. Dr C to send office a message who will call patient with instructions.

## 2022-01-12 NOTE — Anesthesia Procedure Notes (Deleted)
Procedure Name: General with mask airway Date/Time: 01/12/2022 12:02 PM Performed by: Erick Colace, CRNA Pre-anesthesia Checklist: Timeout performed, Patient being monitored, Suction available, Emergency Drugs available and Patient identified Patient Re-evaluated:Patient Re-evaluated prior to induction Oxygen Delivery Method: Ambu bag Preoxygenation: Pre-oxygenation with 100% oxygen Induction Type: IV induction

## 2022-01-16 ENCOUNTER — Telehealth: Payer: Self-pay | Admitting: Nurse Practitioner

## 2022-01-16 MED ORDER — METOPROLOL TARTRATE 50 MG PO TABS: 50.0000 mg | ORAL_TABLET | Freq: Two times a day (BID) | ORAL | 3 refills | Status: DC

## 2022-01-16 NOTE — Telephone Encounter (Signed)
Called patient to assess his HR and BP. He reports SBP is consistently 120-130 mmHg and DBP 70 mmHg, heart rate is 120s bpm. I advised him to increase metoprolol tartrate to 50 mg BID. Advised him to continue to monitor HR and BP and call if HR does not improve. I scheduled him for an appointment on 3/14. He thanked me for the call.

## 2022-01-17 ENCOUNTER — Telehealth: Payer: Self-pay | Admitting: *Deleted

## 2022-01-17 DIAGNOSIS — Z5181 Encounter for therapeutic drug level monitoring: Secondary | ICD-10-CM

## 2022-01-17 DIAGNOSIS — I4891 Unspecified atrial fibrillation: Secondary | ICD-10-CM

## 2022-01-17 NOTE — Telephone Encounter (Signed)
Called and spoke to pt. Pt due to have have INR checked. Pt at [facility] and would like order faxed to Vivian at [facility]. Pt requesting lab ordered be faxed to [phone]. Orders placed and order faxed.

## 2022-01-18 ENCOUNTER — Ambulatory Visit (INDEPENDENT_AMBULATORY_CARE_PROVIDER_SITE_OTHER): Payer: Self-pay | Admitting: Cardiovascular Disease

## 2022-01-18 DIAGNOSIS — Z7901 Long term (current) use of anticoagulants: Secondary | ICD-10-CM

## 2022-01-18 LAB — PROTIME-INR
INR: 2.4 — ABNORMAL HIGH (ref 0.9–1.2)
Prothrombin Time: 24.1 s — ABNORMAL HIGH (ref 9.1–12.0)

## 2022-01-19 ENCOUNTER — Telehealth: Payer: Self-pay

## 2022-01-19 MED ORDER — APIXABAN 5 MG PO TABS: 5.0000 mg | ORAL_TABLET | Freq: Two times a day (BID) | ORAL | 3 refills | Status: DC

## 2022-01-19 NOTE — Telephone Encounter (Signed)
-----   Message from Geralynn Rile, MD sent at 01/19/2022  9:08 AM EST ----- ?Regarding: RE: cancelled cardioversion/needs coumadin follow up ?Stop coumadin and start eliquis. OK to keep 3/14. He will need to be on eliquis 3 weeks anyway prior to DCCV.  ? ?[place] T. Audie Box, MD, [facility] ?[place]  ?[address], [address] ?[place], [place] [address] ?[phone]  ?9:08 AM ? ?----- Message ----- ?From: Caprice Beaver, LPN ?Sent: 01/18/2022   1:53 PM EST ?To: Geralynn Rile, MD ?Subject: RE: cancelled cardioversion/needs coumadin f# ? ?Hey,  ?I just was able to look at this- I seen that patient seen coumadin clinic today, also patient is set to see Sharyn Lull, NP on 03/14- is this okay or do you want something sooner? ? ?Also since seeing Coumadin today, do you want to go ahead and still stop this and start on Eliquis? Just wanting to make sure.  ? ?Thanks! ? ?----- Message ----- ?From: Geralynn Rile, MD ?Sent: 01/18/2022   8:16 AM EST ?To: Caprice Beaver, LPN ?Subject: RE: cancelled cardioversion/needs coumadin f# ? ?He is going to need to get rescheduled for an office appointment. Not sure why she did not send me this message.  ? ?Let's put him on eliquis 5 mg BID and get him back in the office. OK to see an APP.  ? ?We can plan for DCCV once on eliquis for 3 weeks. You can cc me on a message to whoever he sees.  ? ?[place] T. Audie Box, MD, [facility] ?[place]  ?[address] ?[place], [place] [address] ?[phone]  ?8:15 AM ? ?----- Message ----- ?From: Caprice Beaver, LPN ?Sent: 01/17/2022   4:59 PM EST ?To: Geralynn Rile, MD ?Subject: FW: cancelled cardioversion/needs coumadin f# ? ?Darlyn Chamber, NP sent this to me, wanted you to be aware. Do you need me to do anything?  ? ?----- Message ----- ?From: Emmaline Life, NP ?Sent: 01/13/2022   1:14 PM EST ?To: Caprice Beaver, LPN ?Subject: FW: cancelled cardioversion/needs coumadin f# ? ? ?-----  Message ----- ?From: Buford Dresser, MD ?Sent: 01/12/2022  12:16 PM EST ?To: Emmaline Life, NP, Cv Div Nl Anticoag ?Subject: cancelled cardioversion/needs coumadin follo# ? ?Hi team. I spoke with Sharyn Lull about this patient yesterday. Dr. Audie Box had been ok to proceed with cardioversion with INR 1.8 previously given than he has an atriclip on his left atrial appendage. I know his coumadin was adjusted from this point. Sharyn Lull and I spoke yesterday, and I spoke with the patient today as well. As long as his INR was over 2 today, I was ok to proceed with cardioversion only. However, given the risk of thrombosis with cardioversion alone, if the INR was less than 2 we either needed a lovenox bridge starting today or reschedule. His INR returned 1.5 despite increase in coumadin dose. He prefers to avoid lovenox. Given this, he will need to have close follow up of his INR and his cardioversion rescheduled. Once he is rescheduled, I would make sure that the performing provider is aware--even with atriclip, some providers wants to do TEE with significant period of subtherapeutic INR. ? ?Thanks all. ?Buford Dresser ? ? ? ? ? ? ?

## 2022-01-19 NOTE — Telephone Encounter (Signed)
Called patient, advised of message from MD.  ?Patient aware to stop coumadin- start Eliquis 5 mg twice daily. RX sent and med list updated. ? ?Patient aware he should be on this for 3 weeks before DCCV. He does state he is still running 120 HR- he is feeling fine, and does not even notice this. I did advise patient I would notify MD, and call back if any recommendations.  ? ?

## 2022-01-20 NOTE — Telephone Encounter (Signed)
Noted. Thanks.

## 2022-01-23 ENCOUNTER — Other Ambulatory Visit: Payer: Self-pay

## 2022-01-23 ENCOUNTER — Telehealth: Payer: Self-pay | Admitting: *Deleted

## 2022-01-23 ENCOUNTER — Other Ambulatory Visit (HOSPITAL_COMMUNITY): Payer: Self-pay

## 2022-01-23 MED ORDER — APIXABAN 5 MG PO TABS
5.0000 mg | ORAL_TABLET | Freq: Two times a day (BID) | ORAL | 3 refills | Status: DC
Start: 2022-01-23 — End: 2024-06-03
  Filled 2022-01-23: qty 60, 30d supply, fill #0
  Filled 2022-07-06: qty 60, 30d supply, fill #1

## 2022-01-23 NOTE — Telephone Encounter (Signed)
It appears that the cardiologist office has already sent in Rx for Eliquis. My understanding is that Eliquis is relatively affordable. Is there any way we can help him with the cost in case it is not?  ?Thank you!

## 2022-01-23 NOTE — Telephone Encounter (Signed)
Rx for Eliquis '5mg'$  bid #60 tabs sent to [facility] at [facility].  ?

## 2022-01-23 NOTE — Telephone Encounter (Signed)
Call to [employer] [facility].  Patient will be able to get the Eliquis that is requested x1 for free and then for $10.00.  After getting Pharmacist will see if Camile can sign patient up for Assistance with getting the medication . ?

## 2022-01-23 NOTE — Telephone Encounter (Signed)
Thank you so much for looking into this! I sent the Rx to community pharmacy for him. I can sign anything else that is needed. If you need a paper copy signed today though; Dr Johnney Ou is in the clinic and can do so.  ? ?Dr Raliegh Ip- this patient was previously on Xarelto; however, underwent valve surgery and was placed on Warfarin. However, has been subtherapeutic. He has atrial flutter and HR has been difficult to control per cardiology notes. The plan is for ablation. Patient's cardiologist is okay with changing his anticoagulation to Eliquis. However, due to price, PCP office needs to send it in to the community pharmacy. I have sent the Rx but if there is anything that needs to be signed in person - can you please do so?
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
## 2022-01-26 ENCOUNTER — Other Ambulatory Visit: Payer: Self-pay
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
## 2022-01-26 NOTE — Telephone Encounter (Signed)
I spoke to the patient and informed him that his refill for Spironolactone (Aldactone) 25 MG is at the [facility]. And he asked about Dilaudid 2 MG I informed him our doctor's do not prescribe that type of medication at [facility]. Patient acknowledged my statement and understood. ?

## 2022-01-26 NOTE — Telephone Encounter (Signed)
I spoke to the patient and informed him that his refill for Spironolactone (Aldactone) 25 MG is at the [facility]. And he asked about Dilaudid 2 MG I informed him our doctor's do not prescribe that type of medication at [facility]. Patient acknowledged my statement and understood. ?

## 2022-01-30 ENCOUNTER — Other Ambulatory Visit (HOSPITAL_COMMUNITY): Payer: Self-pay

## 2022-01-31 ENCOUNTER — Ambulatory Visit: Payer: Self-pay | Admitting: Nurse Practitioner

## 2022-02-09 ENCOUNTER — Ambulatory Visit: Payer: Self-pay | Admitting: Nurse Practitioner

## 2022-02-09 ENCOUNTER — Ambulatory Visit: Payer: Self-pay | Admitting: Physician Assistant

## 2022-02-16 ENCOUNTER — Encounter: Payer: Self-pay | Admitting: Physician Assistant

## 2022-02-16 ENCOUNTER — Other Ambulatory Visit: Payer: Self-pay | Admitting: Physician Assistant

## 2022-02-16 ENCOUNTER — Ambulatory Visit (INDEPENDENT_AMBULATORY_CARE_PROVIDER_SITE_OTHER): Payer: Self-pay | Admitting: Physician Assistant

## 2022-02-16 VITALS — BP 126/88 | HR 124 | Ht 67.0 in | Wt 278.6 lb

## 2022-02-16 DIAGNOSIS — I4892 Unspecified atrial flutter: Secondary | ICD-10-CM

## 2022-02-16 DIAGNOSIS — Z9889 Other specified postprocedural states: Secondary | ICD-10-CM

## 2022-02-16 DIAGNOSIS — Z01810 Encounter for preprocedural cardiovascular examination: Secondary | ICD-10-CM

## 2022-02-16 LAB — BASIC METABOLIC PANEL
BUN/Creatinine Ratio: 25 — ABNORMAL HIGH (ref 9–20)
BUN: 30 mg/dL — ABNORMAL HIGH (ref 6–24)
CO2: 25 mmol/L (ref 20–29)
Calcium: 9.3 mg/dL (ref 8.7–10.2)
Chloride: 98 mmol/L (ref 96–106)
Creatinine, Ser: 1.2 mg/dL (ref 0.76–1.27)
Glucose: 122 mg/dL — ABNORMAL HIGH (ref 70–99)
Potassium: 5 mmol/L (ref 3.5–5.2)
Sodium: 137 mmol/L (ref 134–144)
eGFR: 73 mL/min/{1.73_m2} (ref >59)

## 2022-02-16 LAB — CBC
Hematocrit: 45.4 % (ref 37.5–51.0)
Hemoglobin: 14.3 g/dL (ref 13.0–17.7)
MCH: 25.5 pg — ABNORMAL LOW (ref 26.6–33.0)
MCHC: 31.5 g/dL (ref 31.5–35.7)
MCV: 81 fL (ref 79–97)
Platelets: 330 10*3/uL (ref 150–450)
RBC: 5.61 x10E6/uL (ref 4.14–5.80)
RDW: 14.6 % (ref 11.6–15.4)
WBC: 9.4 10*3/uL (ref 3.4–10.8)
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
## 2022-02-16 NOTE — Patient Instructions (Addendum)
Medication Instructions:  ?Your physician recommends that you continue on your current medications as directed. Please refer to the Current Medication list given to you today. ? ?*If you need a refill on your cardiac medications before your next appointment, please call your pharmacy* ? ?Lab Work: ?Your physician recommends that you return for lab work TODAY:  ?BMET  ?CBC ?If you have labs (blood work) drawn today and your tests are completely normal, you will receive your results only by: ?MyChart Message (if you have MyChart) OR ?A paper copy in the mail ?If you have any lab test that is abnormal or we need to change your treatment, we will call you to review the results. ? ?Testing/Procedures: ?Your physician has recommended that you have a Cardioversion (DCCV). Electrical Cardioversion uses a jolt of electricity to your heart either through paddles or wired patches attached to your chest. This is a controlled, usually prescheduled, procedure. Defibrillation is done under light anesthesia in the hospital, and you usually go home the day of the procedure. This is done to get your heart back into a normal rhythm. You are not awake for the procedure. Please see the instruction sheet given to you today. ? ? ?Follow-Up: ?At [facility], you and your health needs are our priority.  As part of our continuing mission to provide you with exceptional heart care, we have created designated Provider Care Teams.  These Care Teams include your primary Cardiologist (physician) and Advanced Practice Providers (APPs -  Physician Assistants and Nurse Practitioners) who all work together to provide you with the care you need, when you need it. ? ? ?Your next appointment:   ?2 week(s) ? ?The format for your next appointment:   ?In Person ? ?Provider:   ?Almyra Deforest, PA-C      ? ? ?Other Instructions ? ?Dear Orlando Penner. [place],  ? ?You are scheduled for a Cardioversion on Wednesday 02/22/22 with Dr. Harl Bowie.  Please arrive at the [facility] (Main Entrance A) at [facility]: [address] [place], [place] [address] at 12:30 PM.  ? ?DIET: Nothing to eat or drink after midnight except a sip of water with medications (see medication instructions below) ? ?FYI: For your safety, and to allow [place] to monitor your vital signs accurately during the surgery/procedure we request that   ?if you have artificial nails, gel coating, SNS etc. Please have those removed prior to your surgery/procedure. Not having the nail coverings /polish removed may result in cancellation or delay of your surgery/procedure. ? ? ?Medication Instructions: ?Hold Lasix  ? ?Continue your anticoagulant: Eliquis  ?You will need to continue your anticoagulant after your procedure until you  are told by your  ?Provider that it is safe to stop ? ? ?Labs: If patient is on Coumadin, patient needs pt/INR, CBC, BMET within 3 days (No pt/INR needed for patients taking Xarelto, Eliquis, Pradaxa) ?For patients receiving anesthesia for TEE and all Cardioversion patients: BMET, CBC within 1 week ? ?Come to: [facility] between the hours of 8:00 am and 4:30 pm. You do not have to be fasting. ? ?You must have a responsible person to drive you home and stay in the waiting area during your procedure. Failure to do so could result in cancellation. ? ?Interior and spatial designer cards. ? ?*Special Note: Every effort is made to have your procedure done on time. Occasionally there are emergencies that occur at the hospital that may cause delays. Please be patient if  a delay does occur.  ? ? ?

## 2022-02-21 ENCOUNTER — Other Ambulatory Visit: Payer: Self-pay | Admitting: Physician Assistant

## 2022-02-21 ENCOUNTER — Encounter (HOSPITAL_COMMUNITY): Payer: Self-pay | Admitting: Internal Medicine

## 2022-02-21 NOTE — Progress Notes (Signed)
Attempted to obtain medical history via telephone, unable to reach at this time. Unable to leave voicemail to return pre surgical testing department's phone call.   ?

## 2022-02-22 ENCOUNTER — Encounter (HOSPITAL_COMMUNITY)
Admission: RE | Disposition: A | Discharge status: Home / Self Care | Payer: Self-pay | Source: Home / Self Care | Attending: Internal Medicine

## 2022-02-22 ENCOUNTER — Ambulatory Visit (HOSPITAL_BASED_OUTPATIENT_CLINIC_OR_DEPARTMENT_OTHER): Payer: Self-pay | Admitting: Anesthesiology

## 2022-02-22 ENCOUNTER — Institutional Professional Consult (permissible substitution): Payer: Self-pay | Admitting: Cardiology

## 2022-02-22 ENCOUNTER — Ambulatory Visit (HOSPITAL_COMMUNITY): Payer: Self-pay | Admitting: Anesthesiology

## 2022-02-22 ENCOUNTER — Encounter (HOSPITAL_COMMUNITY): Payer: Self-pay | Admitting: Internal Medicine

## 2022-02-22 ENCOUNTER — Ambulatory Visit (HOSPITAL_COMMUNITY)
Admission: RE | Admit: 2022-02-22 | Discharge: 2022-02-22 | Disposition: A | Discharge status: Home / Self Care | Payer: Self-pay | Attending: Internal Medicine | Admitting: Internal Medicine

## 2022-02-22 ENCOUNTER — Other Ambulatory Visit: Payer: Self-pay

## 2022-02-22 DIAGNOSIS — I5042 Chronic combined systolic (congestive) and diastolic (congestive) heart failure: Secondary | ICD-10-CM | POA: Insufficient documentation

## 2022-02-22 DIAGNOSIS — I509 Heart failure, unspecified: Secondary | ICD-10-CM

## 2022-02-22 DIAGNOSIS — N189 Chronic kidney disease, unspecified: Secondary | ICD-10-CM | POA: Insufficient documentation

## 2022-02-22 DIAGNOSIS — I4892 Unspecified atrial flutter: Secondary | ICD-10-CM | POA: Insufficient documentation

## 2022-02-22 DIAGNOSIS — I110 Hypertensive heart disease with heart failure: Secondary | ICD-10-CM

## 2022-02-22 DIAGNOSIS — Z7901 Long term (current) use of anticoagulants: Secondary | ICD-10-CM | POA: Insufficient documentation

## 2022-02-22 DIAGNOSIS — G4730 Sleep apnea, unspecified: Secondary | ICD-10-CM | POA: Insufficient documentation

## 2022-02-22 DIAGNOSIS — I3481 Nonrheumatic mitral (valve) annulus calcification: Secondary | ICD-10-CM | POA: Insufficient documentation

## 2022-02-22 DIAGNOSIS — I4891 Unspecified atrial fibrillation: Secondary | ICD-10-CM | POA: Insufficient documentation

## 2022-02-22 DIAGNOSIS — Z79899 Other long term (current) drug therapy: Secondary | ICD-10-CM | POA: Insufficient documentation

## 2022-02-22 DIAGNOSIS — I130 Hypertensive heart and chronic kidney disease with heart failure and stage 1 through stage 4 chronic kidney disease, or unspecified chronic kidney disease: Secondary | ICD-10-CM | POA: Insufficient documentation

## 2022-02-22 DIAGNOSIS — G4733 Obstructive sleep apnea (adult) (pediatric): Secondary | ICD-10-CM

## 2022-02-22 HISTORY — PX: CARDIOVERSION: SHX1299

## 2022-02-22 SURGERY — CARDIOVERSION
Anesthesia: General

## 2022-02-22 MED ORDER — LIDOCAINE 2% (20 MG/ML) 5 ML SYRINGE
INTRAMUSCULAR | Status: DC | PRN
Start: 2022-02-22 — End: 2022-02-22
  Administered 2022-02-22: 100 mg via INTRAVENOUS

## 2022-02-22 MED ORDER — SODIUM CHLORIDE 0.9 % IV SOLN: INTRAVENOUS | Status: DC

## 2022-02-22 MED ORDER — PROPOFOL 10 MG/ML IV BOLUS
INTRAVENOUS | Status: DC | PRN
  Administered 2022-02-22: 100 mg via INTRAVENOUS

## 2022-02-22 NOTE — Interval H&P Note (Signed)
History and Physical Interval Note: ? ?02/22/2022 ?12:36 PM ? ?Anthony Byrd  has presented today for surgery, with the diagnosis of afib.  The various methods of treatment have been discussed with the patient and family. After consideration of risks, benefits and other options for treatment, the patient has consented to  Procedure(s): ?CARDIOVERSION (N/A) as a surgical intervention.  The patient's history has been reviewed, patient examined, no change in status, stable for surgery.  I have reviewed the patient's chart and labs.  Questions were answered to the patient's satisfaction.   ? ? ?[name] ? ? ?

## 2022-02-22 NOTE — Anesthesia Preprocedure Evaluation (Addendum)
Anesthesia Evaluation  ?Patient identified by MRN, date of birth, ID band ?Patient awake ? ? ? ?Reviewed: ?Allergy & Precautions, NPO status , Patient's Chart, lab work & pertinent test results ? ?Airway ?Mallampati: III ? ? ? ? ? ? Dental ?  ?Pulmonary ?sleep apnea and Continuous Positive Airway Pressure Ventilation ,  ?  ?Pulmonary exam normal ? ? ? ? ? ? ? Cardiovascular ?hypertension, Pt. on medications and Pt. on home beta blockers ?+CHF  ?+ dysrhythmias Atrial Fibrillation  ?Rhythm:Irregular Rate:Tachycardia ? ?12/2021 ECHO: ?1. EF 50-55%. The LV has low normal function, no regional wall motion abnormalities.  ?2. RV systolic function is normal, size is normal.  ??3. Left atrial appendage is surgically absent. . No left atrial/left atrial appendage thrombus was detected.  ??4. A small pericardial effusion is present.  ??5. The mitral valve has been repaired/replaced. Mild MR. No MS. The mean mitral valve gradient is 6.0 mmHg. Echo findings are consistent with normal structure and function of the mitral valve prosthesis.  ??6. TR is moderate. ?  ?Neuro/Psych ?negative psych ROS  ? GI/Hepatic ?  ?Endo/Other  ?Morbid obesity ? Renal/GU ?  ? ?  ?Musculoskeletal ? ? Abdominal ?(+) + obese,   ?Peds ? Hematology ?eliquis   ?Anesthesia Other Findings ?A-fib with RVR ? Reproductive/Obstetrics ? ?  ? ? ? ? ? ? ? ? ? ? ? ? ? ?  ?  ? ? ? ? ? ? ?Anesthesia Physical ?Anesthesia Plan ? ?ASA: 4 ? ?Anesthesia Plan: General  ? ?Post-op Pain Management:   ? ?Induction: Intravenous ? ?PONV Risk Score and Plan: 2 and Propofol infusion and Treatment may vary due to age or medical condition ? ?Airway Management Planned: Mask ? ?Additional Equipment:  ? ?Intra-op Plan:  ? ?Post-operative Plan:  ? ?Informed Consent: I have reviewed the patients History and Physical, chart, labs and discussed the procedure including the risks, benefits and alternatives for the proposed anesthesia with the patient or  authorized representative who has indicated his/her understanding and acceptance.  ? ? ? ?Dental advisory given ? ?Plan Discussed with: CRNA ? ?Anesthesia Plan Comments:   ? ? ? ? ? ?Anesthesia Quick Evaluation ? ?

## 2022-02-22 NOTE — CV Procedure (Signed)
Procedure: Electrical Cardioversion ?Indications:  Atrial Flutter ? ?Procedure Details: ? ?Consent: Risks of procedure as well as the alternatives and risks of each were explained to the (patient/caregiver).  Consent for procedure obtained. ? ?Time Out: Verified patient identification, verified procedure, site/side was marked, verified correct patient position, special equipment/implants available, medications/allergies/relevent history reviewed, required imaging and test results available. PERFORMED. ? ?Patient placed on cardiac monitor, pulse oximetry, supplemental oxygen as necessary.  ?Sedation given:  Propofol ?Pacer pads placed anterior and posterior chest. ? ?Cardioverted 1 time(s).  ?Cardioversion with synchronized biphasic 120J shock. ? ?Evaluation: ?Findings: Post procedure EKG shows: NSR ?Complications: None ?Patient did tolerate procedure well. ? ?Time Spent Directly with the Patient: ? ?20 minutes  ? ?Anthony Byrd ?02/22/2022, 1:48 PM ? ?

## 2022-02-22 NOTE — Transfer of Care (Signed)
Immediate Anesthesia Transfer of Care Note ? ?Patient: Anthony Byrd ? ?Procedure(s) Performed: CARDIOVERSION ? ?Patient Location: PACU ? ?Anesthesia Type:MAC ? ?Level of Consciousness: drowsy ? ?Airway & Oxygen Therapy: Patient Spontanous Breathing ? ?Post-op Assessment: Report given to RN and Post -op Vital signs reviewed and stable ? ?Post vital signs: Reviewed and stable ? ?Last Vitals:  ?Vitals Value Taken Time  ?BP    ?Temp    ?Pulse    ?Resp    ?SpO2    ? ? ?Last Pain:  ?Vitals:  ? 02/22/22 1223  ?TempSrc: Temporal  ?PainSc: 0-No pain  ?   ? ?  ? ?Complications: No notable events documented. ?

## 2022-02-22 NOTE — Discharge Instructions (Signed)

## 2022-02-23 ENCOUNTER — Encounter (HOSPITAL_COMMUNITY): Payer: Self-pay | Admitting: Internal Medicine

## 2022-02-23 NOTE — Anesthesia Postprocedure Evaluation (Signed)
Anesthesia Post Note ? ?Patient: PIOTR CHRISTOPHER ? ?Procedure(s) Performed: CARDIOVERSION ? ?  ? ?Patient location during evaluation: Endoscopy ?Anesthesia Type: General ?Level of consciousness: awake ?Pain management: pain level controlled ?Vital Signs Assessment: post-procedure vital signs reviewed and stable ?Respiratory status: spontaneous breathing, nonlabored ventilation, respiratory function stable and patient connected to nasal cannula oxygen ?Cardiovascular status: blood pressure returned to baseline and stable ?Postop Assessment: no apparent nausea or vomiting ?Anesthetic complications: no ? ? ?No notable events documented. ? ?Last Vitals:  ?Vitals:  ? 02/22/22 1400 02/22/22 1410  ?BP: (!) 107/52 108/71  ?Pulse: 66 67  ?Resp: 20 14  ?SpO2: 96% 95%  ?  ?Last Pain:  ?Vitals:  ? 02/22/22 1410  ?TempSrc:   ?PainSc: 0-No pain  ? ? ?  ?  ?  ?  ?  ?  ? ?Alyssa Rotondo P Hayleigh Bawa ? ? ? ? ?

## 2022-03-03 ENCOUNTER — Other Ambulatory Visit: Payer: Self-pay

## 2022-03-03 MED ORDER — SPIRONOLACTONE 25 MG PO TABS: 25.0000 mg | ORAL_TABLET | Freq: Every day | ORAL | 2 refills | Status: DC

## 2022-03-03 MED ORDER — AMIODARONE HCL 200 MG PO TABS: ORAL_TABLET | ORAL | 2 refills | Status: DC

## 2022-03-22 ENCOUNTER — Institutional Professional Consult (permissible substitution): Payer: Self-pay | Admitting: Cardiology

## 2022-03-22 ENCOUNTER — Telehealth: Payer: Self-pay

## 2022-03-22 NOTE — Telephone Encounter (Signed)
Received notification from [place] ([place]) regarding approval for [employer]. Patient assistance approved from 01/28/22 to 01/28/23. ? ?90 day supply delivered to pt 02/10/22. Next refill around 05/13/22 ? ?Phone: [phone] ? ?

## 2022-04-04 ENCOUNTER — Encounter: Payer: Self-pay | Admitting: Cardiovascular Disease

## 2022-04-04 ENCOUNTER — Ambulatory Visit (INDEPENDENT_AMBULATORY_CARE_PROVIDER_SITE_OTHER): Payer: Self-pay | Admitting: Cardiovascular Disease

## 2022-04-04 VITALS — BP 150/86 | HR 70 | Ht 68.0 in | Wt 270.4 lb

## 2022-04-04 DIAGNOSIS — I5032 Chronic diastolic (congestive) heart failure: Secondary | ICD-10-CM

## 2022-04-04 DIAGNOSIS — G4733 Obstructive sleep apnea (adult) (pediatric): Secondary | ICD-10-CM

## 2022-04-04 DIAGNOSIS — I4892 Unspecified atrial flutter: Secondary | ICD-10-CM

## 2022-04-04 DIAGNOSIS — Z9889 Other specified postprocedural states: Secondary | ICD-10-CM

## 2022-04-04 DIAGNOSIS — I4819 Other persistent atrial fibrillation: Secondary | ICD-10-CM

## 2022-04-04 DIAGNOSIS — I340 Nonrheumatic mitral (valve) insufficiency: Secondary | ICD-10-CM

## 2022-04-04 MED ORDER — AMOXICILLIN 500 MG PO TABS: ORAL_TABLET | ORAL | 1 refills | Status: DC

## 2022-04-04 NOTE — Patient Instructions (Addendum)
Medication Instructions:  ?STOP Amiodarone  ?Take Amoxicillin 30-60 minutes before any dental procedures.  ? ?*If you need a refill on your cardiac medications before your next appointment, please call your pharmacy* ? ?Testing/Procedures: ? ?Echocardiogram - Your physician has requested that you have an echocardiogram. Echocardiography is a painless test that uses sound waves to create images of your heart. It provides your doctor with information about the size and shape of your heart and how well your heart?s chambers and valves are working. This procedure takes approximately one hour. There are no restrictions for this procedure.  ? ? ?Follow-Up: ?At [facility], you and your health needs are our priority.  As part of our continuing mission to provide you with exceptional heart care, we have created designated Provider Care Teams.  These Care Teams include your primary Cardiologist (physician) and Advanced Practice Providers (APPs -  Physician Assistants and Nurse Practitioners) who all work together to provide you with the care you need, when you need it. ? ?We recommend signing up for the patient portal called "MyChart".  Sign up information is provided on this After Visit Summary.  MyChart is used to connect with patients for Virtual Visits (Telemedicine).  Patients are able to view lab/test results, encounter notes, upcoming appointments, etc.  Non-urgent messages can be sent to your provider as well.   ?To learn more about what you can do with MyChart, go to [email or web address].   ? ?Your next appointment:   ?6 month(s) ? ?The format for your next appointment:   ?In Person ? ?Provider:   ?[name], MD   ? ? ?Other Instructions ?Referral to Fransico Him, MD for sleep- they will be in contact about an appointment. ? ? ? ? ? ? ? ? ?
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
## 2022-04-06 ENCOUNTER — Telehealth: Payer: Self-pay | Admitting: Licensed Clinical Social Worker

## 2022-04-06 NOTE — Telephone Encounter (Signed)
LCSW called and left message for pt at [phone]. Pt chart notes that he is currently uninsured, appears to have told financial counseling that he was going to apply for Medicaid prior to financial assistance. Pt could benefit from [facility] should he be waiting on a Medicaid decision. Will re-attempt as able.   Westley Hummer, MSW, [place]  [phone]- work cell phone (preferred) [phone]- desk phone

## 2022-04-14 ENCOUNTER — Encounter: Payer: Self-pay | Admitting: Cardiovascular Disease

## 2022-04-14 ENCOUNTER — Other Ambulatory Visit: Payer: Self-pay

## 2022-04-14 MED ORDER — SPIRONOLACTONE 25 MG PO TABS
25.0000 mg | ORAL_TABLET | Freq: Every day | ORAL | 2 refills | Status: DC
Start: 2022-04-14 — End: 2022-10-06

## 2022-04-14 MED ORDER — METOPROLOL TARTRATE 50 MG PO TABS: 50.0000 mg | ORAL_TABLET | Freq: Two times a day (BID) | ORAL | 3 refills | Status: DC

## 2022-04-25 ENCOUNTER — Encounter (HOSPITAL_COMMUNITY): Payer: Self-pay | Admitting: Cardiovascular Disease

## 2022-04-25 ENCOUNTER — Telehealth: Payer: Self-pay | Admitting: Licensed Clinical Social Worker

## 2022-04-25 ENCOUNTER — Encounter (HOSPITAL_COMMUNITY): Payer: Self-pay

## 2022-04-25 ENCOUNTER — Ambulatory Visit (HOSPITAL_COMMUNITY): Payer: Self-pay | Attending: Internal Medicine

## 2022-04-25 NOTE — Telephone Encounter (Signed)
LCSW attempted pt again, as he has no current insurance and ongoing medical work up requested.  Message left at [phone]. Will re-attempt as able.   Westley Hummer, MSW, [place]  [phone]- work cell phone (preferred) [phone]- desk phone

## 2022-04-26 ENCOUNTER — Encounter: Payer: Self-pay | Admitting: Student

## 2022-04-26 ENCOUNTER — Ambulatory Visit (HOSPITAL_COMMUNITY)
Admission: RE | Admit: 2022-04-26 | Discharge: 2022-04-26 | Disposition: A | Discharge status: Home / Self Care | Payer: Self-pay | Source: Ambulatory Visit | Attending: Internal Medicine | Admitting: Internal Medicine

## 2022-04-26 ENCOUNTER — Ambulatory Visit (INDEPENDENT_AMBULATORY_CARE_PROVIDER_SITE_OTHER): Payer: Self-pay | Admitting: Student

## 2022-04-26 VITALS — BP 136/74 | HR 81 | Temp 98.2°F | Ht 68.0 in | Wt 264.6 lb

## 2022-04-26 DIAGNOSIS — I4891 Unspecified atrial fibrillation: Secondary | ICD-10-CM

## 2022-04-26 DIAGNOSIS — S76312A Strain of muscle, fascia and tendon of the posterior muscle group at thigh level, left thigh, initial encounter: Secondary | ICD-10-CM

## 2022-04-26 NOTE — Patient Instructions (Addendum)
For your hamstring strain, you likely have a small tear, we will get an x ray to make sure you do not have a fracture. Please try to avoid strenuous activity like running until your muscle is completely healed and you are without pain. Please use ice and continue to stretch the muscle. I have placed instructions for this at the back of this packet.   If you do not start to see improvement in the next two weeks please schedule an appointment with [place] you may require further imaging.   It is okay to use tylenol and ibuprofen but please only use the ibuprofen sparingly.

## 2022-04-27 ENCOUNTER — Encounter: Payer: Self-pay | Admitting: Student

## 2022-04-27 ENCOUNTER — Telehealth: Payer: Self-pay | Admitting: Licensed Clinical Social Worker

## 2022-04-27 DIAGNOSIS — S76312A Strain of muscle, fascia and tendon of the posterior muscle group at thigh level, left thigh, initial encounter: Secondary | ICD-10-CM | POA: Insufficient documentation
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
## 2022-04-27 NOTE — Progress Notes (Signed)
Internal Medicine Clinic Attending   I saw and evaluated the patient.  I personally confirmed the key portions of the history and exam documented by Dr. Carter and I reviewed pertinent patient test results.  The assessment, diagnosis, and plan were formulated together and I agree with the documentation in the resident's note.  

## 2022-04-27 NOTE — Assessment & Plan Note (Signed)
Patient with regular rate and rhythm this clinic visit. He is currently on eiquis and metoprolol tartrate '50mg'$  BID. He is also s/p MAZE procedure 12/2021 when he had is mitral valve replaced.  -Continue with his current medications

## 2022-04-27 NOTE — Progress Notes (Signed)
Heart and Vascular Care Navigation  04/27/2022  Anthony Byrd [birth year] [id number]  Reason for Referral:  Uninsured, no pharmacy coverage.  Engaged with patient by telephone for initial visit for Heart and Vascular Care Coordination.                                                                                                   Assessment:    LCSW was able to reach pt at [phone], introduced self, role, reason for call.  Pt confirmed home address, PCP, and emergency contact. He lives with roommates, states he pays rent and they own the home. Pt is not currently employed, he does not consider himself disabled just states he hasnt worked for a few years and doesn't remember the last time that he had insurance. Pt has assistance from family and friends, does not receive SNAP or any other assistance programs. He denies that this lack of income keeps him from getting medications or daily living needs. He or others drive him to appts.   I inquired if he would be interested in assistance programs. Pt agreeable. I explained [employer], [employer] and [employer]. I have mailed these for pt and he is aware that he will need to collect supporting documentation. Encouraged pt to let me know if any additional questions/concerns arise. I will f/u at his request via MyChart to ensure paperwork received/answer any questions.                                     HRT/VAS Care Coordination     Patients Home Cardiology Office [place] Team Social Worker   Social Worker Name: Valeda Malm, [place] [facility] [phone]   Living arrangements for the past 2 months Single Family Home   Lives with: Roommate   Patient Current Insurance Coverage Self-Pay   Patient Has Concern With Paying Medical Bills Yes   Patient Concerns With Medical Bills ongoing medical needs, no insurance   Medical Bill Referrals: CAFA/[employer]   Does Patient Have Prescription Coverage?  No   Patient Prescription Assistance Programs Patient Assistance Programs; [place] [employer]   [place] [employer] Medications mailed app;  aspirin, furosemide, metoprolol tartrate, spironolactone   Home Assistive Devices/Equipment CPAP; Eyeglasses; Grab bars in shower   DME Agency NA   [place]       Social History:                                                                             SDOH Screenings   Alcohol Screen: Not on file  Depression (PHQ2-9): Low Risk  (12/23/2021)   Depression (PHQ2-9)    PHQ-2 Score: 0  Financial Resource Strain: Medium Risk (04/27/2022)  Overall Financial Resource Strain (CARDIA)    Difficulty of Paying Living Expenses: Somewhat hard  Food Insecurity: No Food Insecurity (04/27/2022)   Hunger Vital Sign    Worried About Running Out of Food in the Last Year: Never true    Ran Out of Food in the Last Year: Never true  Housing: Low Risk  (04/27/2022)   Housing    Last Housing Risk Score: 0  Physical Activity: Not on file  Social Connections: Not on file  Stress: Not on file  Tobacco Use: Low Risk  (04/26/2022)   Patient History    Smoking Tobacco Use: Never    Smokeless Tobacco Use: Never    Passive Exposure: Not on file  Transportation Needs: No Transportation Needs (04/27/2022)   PRAPARE - Transportation    Lack of Transportation (Medical): No    Lack of Transportation (Non-Medical): No    SDOH Interventions: Financial Resources:  Financial Strain Interventions: Other (Comment) (CAFA; [employer] Card; [employer]) 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 I will f/u via MyChart to pt to continue providing assistance.

## 2022-04-27 NOTE — Assessment & Plan Note (Signed)
Patient presents after experiencing a popping sensation in his L hamstring 3 d prior to today's clinic visit. He states that he was running for exercise when he experienced this. He has never had anything like this happen before. Patient has taken tylenol and feels this has only minimally helped with pain when he initially injured himself, but feels that the pain is mostly resolved. He has noticed that he has changed his gait to try and avoid putting pressure on his L leg.   On exam: He was noted to have eccymoses just under the L buttocks and at the L popliteal fossa. He has some mild TTP of the superior lesion. ROM was intact, he also had some pain with hip flexion.   A pelvic x ray was obtained which did not show an avulsion fracture.   A/P: Patient likely has a hamstring strain. Discussed with patient that he should continue to avoid strenuous activity and should only stretch his muscle if he is not in any pain. Discussed that he should continue APAP as needed and use ice, compression, and elevation.  Also gave patient exercises for hamstring strain rehabilitation.

## 2022-05-05 ENCOUNTER — Telehealth: Payer: Self-pay | Admitting: Licensed Clinical Social Worker

## 2022-05-05 NOTE — Telephone Encounter (Signed)
H&V Care Navigation CSW Progress Note  Clinical Social Worker contacted patient by phone to f/u on assistance applications sent. Pt answered at [phone], he shares that he received information but hasnt had a chance to look at it and asks that I call him back Monday after noon. I confirmed I will do so. Added reminder to my schedule to f/u at 2pm on Monday. Remain available before that time as needed, pt voiced no additional questions/concerns for me at this time.   Patient is participating in a Managed Medicaid Plan:  No, self pay.   SDOH Screenings   Alcohol Screen: Not on file  Depression (PHQ2-9): Low Risk  (12/23/2021)   Depression (PHQ2-9)    PHQ-2 Score: 0  Financial Resource Strain: Medium Risk (04/27/2022)   Overall Financial Resource Strain (CARDIA)    Difficulty of Paying Living Expenses: Somewhat hard  Food Insecurity: No Food Insecurity (04/27/2022)   Hunger Vital Sign    Worried About Running Out of Food in the Last Year: Never true    Ran Out of Food in the Last Year: Never true  Housing: Low Risk  (04/27/2022)   Housing    Last Housing Risk Score: 0  Physical Activity: Not on file  Social Connections: Not on file  Stress: Not on file  Tobacco Use: Low Risk  (04/26/2022)   Patient History    Smoking Tobacco Use: Never    Smokeless Tobacco Use: Never    Passive Exposure: Not on file  Transportation Needs: No Transportation Needs (04/27/2022)   PRAPARE - Transportation    Lack of Transportation (Medical): No    Lack of Transportation (Non-Medical): No       Westley Hummer, MSW, [place]  [phone]- work cell phone (preferred) [phone]- desk phone

## 2022-05-08 ENCOUNTER — Telehealth: Payer: Self-pay | Admitting: Licensed Clinical Social Worker

## 2022-05-08 NOTE — Telephone Encounter (Signed)
H&V Care Navigation CSW Progress Note  Clinical Social Worker contacted patient by phone to f/u on assistance applications. 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 Confirmed he does have my contact information and requested that he give me a call moving forward as needed, reminded him we can work on assistance applications at [facility] if needed. Pt agreeable to contacting me if needed. No additional questions/concerns shared at this time.   Patient is participating in a Managed Medicaid Plan:  No, self pay only  SDOH Screenings   Alcohol Screen: Not on file  Depression (PHQ2-9): Low Risk  (12/23/2021)   Depression (PHQ2-9)    PHQ-2 Score: 0  Financial Resource Strain: Medium Risk (04/27/2022)   Overall Financial Resource Strain (CARDIA)    Difficulty of Paying Living Expenses: Somewhat hard  Food Insecurity: No Food Insecurity (04/27/2022)   Hunger Vital Sign    Worried About Running Out of Food in the Last Year: Never true    Ran Out of Food in the Last Year: Never true  Housing: Low Risk  (04/27/2022)   Housing    Last Housing Risk Score: 0  Physical Activity: Not on file  Social Connections: Not on file  Stress: Not on file  Tobacco Use: Low Risk  (04/26/2022)   Patient History    Smoking Tobacco Use: Never    Smokeless Tobacco Use: Never    Passive Exposure: Not on file  Transportation Needs: No Transportation Needs (04/27/2022)   PRAPARE - Transportation    Lack of Transportation (Medical): No    Lack of Transportation (Non-Medical): No

## 2022-05-16 ENCOUNTER — Ambulatory Visit (HOSPITAL_COMMUNITY): Payer: Self-pay

## 2022-05-26 ENCOUNTER — Ambulatory Visit (HOSPITAL_COMMUNITY): Payer: Self-pay | Attending: Cardiology

## 2022-06-15 ENCOUNTER — Ambulatory Visit (HOSPITAL_COMMUNITY): Payer: Self-pay | Attending: Cardiology

## 2022-06-15 DIAGNOSIS — I352 Nonrheumatic aortic (valve) stenosis with insufficiency: Secondary | ICD-10-CM

## 2022-06-15 DIAGNOSIS — Z9889 Other specified postprocedural states: Secondary | ICD-10-CM | POA: Insufficient documentation

## 2022-06-15 LAB — ECHOCARDIOGRAM COMPLETE
AR max vel: 2.31 cm2
AV Area VTI: 2.48 cm2
AV Area mean vel: 2.26 cm2
AV Mean grad: 13.5 mmHg
AV Peak grad: 24.8 mmHg
Ao pk vel: 2.49 m/s
Area-P 1/2: 2.05 cm2
MV VTI: 2.02 cm2
P 1/2 time: 406 msec
S' Lateral: 3.5 cm

## 2022-07-06 ENCOUNTER — Other Ambulatory Visit (HOSPITAL_COMMUNITY): Payer: Self-pay

## 2022-07-12 ENCOUNTER — Ambulatory Visit (INDEPENDENT_AMBULATORY_CARE_PROVIDER_SITE_OTHER): Payer: Self-pay | Admitting: Student

## 2022-07-12 DIAGNOSIS — I872 Venous insufficiency (chronic) (peripheral): Secondary | ICD-10-CM

## 2022-07-12 DIAGNOSIS — R222 Localized swelling, mass and lump, trunk: Secondary | ICD-10-CM | POA: Insufficient documentation

## 2022-07-12 NOTE — Assessment & Plan Note (Signed)
Patients reports weakness below the knees that started a couple months ago.  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  This causes his legs to feel weak and this sensation is accompanied by a deep dull ache.  Denies overt pain, tingling, numbness, and experiencing any falls.  He walks regularly and does not experience these symptoms during physical activity.  He denies an intense desire to move his legs.  On exam, slight skin hyperpigmentation was present bilaterally in lower extremities between the knee and malleolus.  There was no erythema lesions.  Sensation to light touch was intact in dermatomes L4-S1 and strength 5/5 in lower extremities bilaterally. This presentation is most consistent with chronic venous insufficiency.  -Encourage use of compression stockings at work to improve venous blood flow -Suggest regular elevation of legs and periodic movement
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
## 2022-07-12 NOTE — Assessment & Plan Note (Addendum)
Patient reports experiencing an acute sharp pain in the middle of his chest.  Pain was localized to the middle of his chest and did not radiate anywhere.  He experienced this pain several times, each episode lasting a few seconds, and the pain has not returned in several days. He describes feeling a bump on his chest where the pain seems to have originated from.  He had a mitral valve repair procedure performed by thoracic surgery in February 2023. He has a large vertical incision over his sternum several sternal loops were placed during the procedure's closure.  On palpation, there is a focal bump located adjacent to his incision scar.  Chest radiograph confirms the presence of several sternal loops.  This is almost certainly the etiology of his chest lump and associated pain.  No intervention is indicated at this time.   -Provide reassurance and return to clinic if pain returns, worsens, or persists

## 2022-07-12 NOTE — Patient Instructions (Signed)
   Thank you, Anthony Byrd, for allowing [place] to provide your care today. Today we discussed . . .  > Leg Weakness       - we believe that you have chronic venous insufficiency       - the treatment for this is compression stockings, leg elevation, and movement > Chest Bump       - this is due to a sternal notch from your recent surgery       - if your pain returns or worsens, then let us know   I have ordered the following labs for you:  Lab Orders  No laboratory test(s) ordered today      Tests ordered today:  None   Referrals ordered today:   Referral Orders  No referral(s) requested today      I have ordered the following medication/changed the following medications:   Stop the following medications: There are no discontinued medications.   Start the following medications: No orders of the defined types were placed in this encounter.     Follow up: 6 months    Remember:  Please try compression, elevation, and movement for your lower leg symptoms. We will see you again in six months, but please let us know if you need to come in sooner!   Should you have any questions or concerns please call the internal medicine clinic at [phone].     Roswell Nickel, MD [place]

## 2022-07-19 NOTE — Progress Notes (Signed)
Internal Medicine Clinic Attending  Case discussed with Dr. Jodi Mourning  at the time of the visit.  We reviewed the resident's history and exam and pertinent patient test results.  I agree with the assessment, diagnosis, and plan of care documented in the resident's note.
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
## 2022-10-06 ENCOUNTER — Encounter: Payer: Self-pay | Admitting: Cardiovascular Disease

## 2022-10-06 ENCOUNTER — Ambulatory Visit: Payer: Self-pay | Attending: Cardiovascular Disease | Admitting: Cardiovascular Disease

## 2022-10-06 VITALS — BP 142/82 | HR 89 | Ht 68.0 in | Wt 241.6 lb

## 2022-10-06 DIAGNOSIS — I5032 Chronic diastolic (congestive) heart failure: Secondary | ICD-10-CM

## 2022-10-06 DIAGNOSIS — Z9889 Other specified postprocedural states: Secondary | ICD-10-CM

## 2022-10-06 DIAGNOSIS — I4892 Unspecified atrial flutter: Secondary | ICD-10-CM

## 2022-10-06 DIAGNOSIS — I4819 Other persistent atrial fibrillation: Secondary | ICD-10-CM

## 2022-10-06 DIAGNOSIS — I340 Nonrheumatic mitral (valve) insufficiency: Secondary | ICD-10-CM

## 2022-10-06 MED ORDER — AMOXICILLIN 500 MG PO TABS: ORAL_TABLET | ORAL | 1 refills | Status: DC

## 2022-10-06 MED ORDER — SPIRONOLACTONE 25 MG PO TABS: 25.0000 mg | ORAL_TABLET | Freq: Every day | ORAL | 2 refills | Status: DC

## 2022-10-06 MED ORDER — METOPROLOL TARTRATE 50 MG PO TABS: 50.0000 mg | ORAL_TABLET | Freq: Two times a day (BID) | ORAL | 3 refills | Status: DC

## 2022-10-06 NOTE — Patient Instructions (Signed)
Medication Instructions:  Refills sent to pharmacy.   We did send in Amoxicillin 2g that you should take 30-60 minutes before dental work.   *If you need a refill on your cardiac medications before your next appointment, please call your pharmacy*   Follow-Up: At [facility], you and your health needs are our priority.  As part of our continuing mission to provide you with exceptional heart care, we have created designated Provider Care Teams.  These Care Teams include your primary Cardiologist (physician) and Advanced Practice Providers (APPs -  Physician Assistants and Nurse Practitioners) who all work together to provide you with the care you need, when you need it.  We recommend signing up for the patient portal called "MyChart".  Sign up information is provided on this After Visit Summary.  MyChart is used to connect with patients for Virtual Visits (Telemedicine).  Patients are able to view lab/test results, encounter notes, upcoming appointments, etc.  Non-urgent messages can be sent to your provider as well.   To learn more about what you can do with MyChart, go to [email or web address].    Your next appointment:   12 month(s)  The format for your next appointment:   In Person  Provider:   [name], MD

## 2022-12-28 ENCOUNTER — Encounter (HOSPITAL_COMMUNITY): Payer: Self-pay | Admitting: *Deleted

## 2023-02-10 ENCOUNTER — Other Ambulatory Visit: Payer: Self-pay | Admitting: Nurse Practitioner

## 2023-11-06 ENCOUNTER — Other Ambulatory Visit: Payer: Self-pay | Admitting: Cardiovascular Disease

## 2023-11-25 NOTE — Progress Notes (Deleted)
  Cardiology Office Note:  .   Date:  11/25/2023  ID:  Anthony Byrd, DOB October 11, 1971, MRN [id number] PCP: Elicia Sharper, DO  [facility] Providers Cardiologist:  Darryle ONEIDA Decent, MD { Click to update primary MD,subspecialty MD or APP then REFRESH:1}   History of Present Illness: .   Anthony Byrd is a 53 y.o. male with below history who presents for follow-up.      Problem List Severe MR -s/p MV repair/MAZE @ [facility] 12/12/2021 -normal coronaries  Persistent atrial fibrillation -s/p MAZE 12/12/2021 -Atypical AFL TEE/DCCV 12/27/2021 -CHADSVASC=2 (HTN, CHF) HFpEF -EF 50-55% -EF 70-75% 05/2022 HTN OSA    ROS: All other ROS reviewed and negative. Pertinent positives noted in the HPI.     Studies Reviewed: SABRA       Physical Exam:   VS:  There were no vitals taken for this visit.   Wt Readings from Last 3 Encounters:  10/06/22 241 lb 9.6 oz (109.6 kg)  07/12/22 245 lb 8 oz (111.4 kg)  04/26/22 264 lb 9.6 oz (120 kg)    Anthony: Well nourished, well developed in no acute distress NECK: No JVD; No carotid bruits CARDIAC: ***RRR, no murmurs, rubs, gallops RESPIRATORY:  Clear to auscultation without rales, wheezing or rhonchi  ABDOMEN: Soft, non-tender, non-distended EXTREMITIES:  No edema; No deformity  ASSESSMENT AND PLAN: .   ***    {Are you ordering a CV Procedure (e.g. stress test, cath, DCCV, TEE, etc)?   Press F2        :[id number]}   Follow-up: No follow-ups on file.  Time Spent with Patient: I have spent a total of *** minutes caring for this patient today face to face, ordering and reviewing labs/tests, reviewing prior records/medical history, examining the patient, establishing an assessment and plan, communicating results/findings to the patient/family, and documenting in the medical record.   Signed, Darryle ONEIDA. Decent, MD, [facility]  [facility]  [address] [place], [place] [address] [phone]  8:22 PM

## 2023-11-28 ENCOUNTER — Ambulatory Visit: Payer: Self-pay | Attending: Cardiovascular Disease | Admitting: Cardiovascular Disease

## 2023-11-28 DIAGNOSIS — Z9889 Other specified postprocedural states: Secondary | ICD-10-CM

## 2023-11-28 DIAGNOSIS — I4819 Other persistent atrial fibrillation: Secondary | ICD-10-CM

## 2023-11-28 DIAGNOSIS — G4733 Obstructive sleep apnea (adult) (pediatric): Secondary | ICD-10-CM

## 2023-11-28 DIAGNOSIS — I4892 Unspecified atrial flutter: Secondary | ICD-10-CM

## 2023-11-28 DIAGNOSIS — I5032 Chronic diastolic (congestive) heart failure: Secondary | ICD-10-CM

## 2023-11-28 DIAGNOSIS — I340 Nonrheumatic mitral (valve) insufficiency: Secondary | ICD-10-CM

## 2023-12-04 ENCOUNTER — Other Ambulatory Visit: Payer: Self-pay | Admitting: Cardiovascular Disease

## 2023-12-17 ENCOUNTER — Telehealth: Payer: Self-pay | Admitting: Cardiovascular Disease

## 2023-12-17 NOTE — Telephone Encounter (Signed)
Patient identification verified by 2 forms. Marilynn Rail, RN    Called and spoke to patient  Patient states:   -his left shoulder and upper arm is tight   -it is hard to raise arm, has a lot of pain   -tried to contact PCP office but some how gets transferred to cardiology   -would like evaluation with PCP   -does not believe this is cardiac related  Patient denies:   -Chest pain/tightness   -SOB/difficulty  RN provided PCP office number, as found in epic [phone])  Advised patient to present to ED if develops (chest pain, chest tightness, SOB)  Patient verbalized understanding, no questions at this time

## 2023-12-17 NOTE — Telephone Encounter (Signed)
  Per MyChart Scheduling message:   Pt c/o of Chest Pain: STAT if active CP, including tightness, pressure, jaw pain, radiating pain to shoulder/upper arm/back, CP unrelieved by Nitro. Symptoms reported of SOB, nausea, vomiting, sweating.  1. Are you having CP right now?    2. Are you experiencing any other symptoms (ex. SOB, nausea, vomiting, sweating)?   3. Is your CP continuous or coming and going?   4. Have you taken Nitroglycerin?   5. How long have you been experiencing CP?    6. If NO CP at time of call then end call with telling Pt to call back or call 911 if Chest pain returns prior to return call from triage team.    No chest pain. Just arm and shoulder. It's constant. No other symptons

## 2023-12-18 ENCOUNTER — Ambulatory Visit: Payer: Self-pay | Admitting: Internal Medicine

## 2023-12-18 ENCOUNTER — Ambulatory Visit (HOSPITAL_COMMUNITY)
Admission: RE | Admit: 2023-12-18 | Discharge: 2023-12-18 | Disposition: A | Discharge status: Home / Self Care | Payer: Self-pay | Source: Ambulatory Visit | Attending: Internal Medicine | Admitting: Internal Medicine

## 2023-12-18 VITALS — BP 151/96 | HR 86 | Temp 97.8°F | Ht 68.0 in | Wt 279.5 lb

## 2023-12-18 DIAGNOSIS — I10 Essential (primary) hypertension: Secondary | ICD-10-CM | POA: Insufficient documentation

## 2023-12-18 DIAGNOSIS — S43005D Unspecified dislocation of left shoulder joint, subsequent encounter: Secondary | ICD-10-CM | POA: Insufficient documentation

## 2023-12-18 DIAGNOSIS — R030 Elevated blood-pressure reading, without diagnosis of hypertension: Secondary | ICD-10-CM

## 2023-12-18 DIAGNOSIS — M25512 Pain in left shoulder: Secondary | ICD-10-CM

## 2023-12-18 DIAGNOSIS — I5032 Chronic diastolic (congestive) heart failure: Secondary | ICD-10-CM

## 2023-12-18 DIAGNOSIS — I4891 Unspecified atrial fibrillation: Secondary | ICD-10-CM

## 2023-12-18 DIAGNOSIS — I340 Nonrheumatic mitral (valve) insufficiency: Secondary | ICD-10-CM

## 2023-12-18 MED ORDER — SPIRONOLACTONE 25 MG PO TABS: 25.0000 mg | ORAL_TABLET | Freq: Every day | ORAL | 3 refills | Status: AC

## 2023-12-18 NOTE — Assessment & Plan Note (Signed)
Patient with regular rate and rhythm this clinic visit.  He is taking Eliquis and metoprolol tartrate 50 mg twice daily.  He had a maze procedure in February 2023 when he had mitral valve replaced.  He reports adherence with these 2 medications although refill history does not reflect this. P: Continue Eliquis and metoprolol 50 mg twice daily
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
## 2023-12-18 NOTE — Assessment & Plan Note (Signed)
History of severe mitral regurg with mitral valve repair.  He is overdue for follow-up with cardiology.  I asked him to schedule this follow-up as he needs repeat echo with history of mitral valve repair and history of tricuspid regurgitation. P: Follow-up with cardiology

## 2023-12-18 NOTE — Assessment & Plan Note (Addendum)
Blood pressure elevated to 151/96 in clinic today.  He is not taking spironolactone in more than a week.  Medications include beta-blocker and mineralocorticoid antagonist which are not first-line for blood pressure.  He could also have elevated blood pressure reading as he is in pain from his left shoulder.  He has not been following regularly with clinic and I think close follow-up would be beneficial for him. Plan: Get BMP Refill spironolactone 25 mg daily Continue metoprolol tartrate 50 mg twice daily Follow-up in 2 to 4 weeks

## 2023-12-18 NOTE — Patient Instructions (Addendum)
Thank you, [name] for allowing [place] to provide your care today.   Left shoulder pain: I am sending you to get a x-ray of your left shoulder and AC joint.  I am concerned for arthritis.  If this shows arthritis then you can come back for steroid joint injection in our clinic.   For the next 3 days please use Tylenol 1000 mg every 6 hours.  With you taking Eliquis please do not take ibuprofen as this increases your risk of bleeding. Please also use ice or heat whatever feels best for your shoulder.  You need follow-up on several conditions including hypertension.  I am rechecking your kidneys today and have sent in a refill of spironolactone.  Please call your cardiology office as you need a follow-up for mitral valve repair  I have ordered the following labs for you:  Lab Orders         BMP8+Anion Gap       I have ordered the following medication/changed the following medications:   Stop the following medications: Medications Discontinued During This Encounter  Medication Reason   amoxicillin (AMOXIL) 500 MG tablet    spironolactone (ALDACTONE) 25 MG tablet Reorder     Start the following medications: Meds ordered this encounter  Medications   spironolactone (ALDACTONE) 25 MG tablet    Sig: Take 1 tablet (25 mg total) by mouth daily. NEED OV.    Dispense:  90 tablet    Refill:  3     Follow up:  1 month    We look forward to seeing you next time. Please call our clinic at [phone] if you have any questions or concerns. The best time to call is Monday-Friday from 9am-4pm, but there is someone available 24/7. If after hours or the weekend, call the main hospital number and ask for the Internal Medicine Resident On-Call. If you need medication refills, please notify your pharmacy one week in advance and they will send [place] a request.   Thank you for trusting me with your care. Wishing you the best!   Rudene Christians, DO [facility]

## 2023-12-18 NOTE — Assessment & Plan Note (Signed)
Assessment: Tenderness over A/C joint.  Special testing points more to osteoarthritis.  Hawkins test was negative for impingement and drop arm test was negative.  I am less concerned for rotator cuff tendinitis or injury.  P: X-ray of left shoulder I talked with patient about if there is arthritis in his [facility] joint that we could try a steroid joint injection to see if this is beneficial. Asked him not to use NSAIDs with history of A-fib on Eliquis Continue to use Tylenol as needed Courage to use of ice or heat whichever is most beneficial

## 2023-12-19 LAB — BMP8+ANION GAP
Anion Gap: 19 mmol/L — ABNORMAL HIGH (ref 10.0–18.0)
BUN/Creatinine Ratio: 24 — ABNORMAL HIGH (ref 9–20)
BUN: 27 mg/dL — ABNORMAL HIGH (ref 6–24)
CO2: 22 mmol/L (ref 20–29)
Calcium: 9 mg/dL (ref 8.7–10.2)
Chloride: 98 mmol/L (ref 96–106)
Creatinine, Ser: 1.12 mg/dL (ref 0.76–1.27)
Glucose: 110 mg/dL — ABNORMAL HIGH (ref 70–99)
Potassium: 4.2 mmol/L (ref 3.5–5.2)
Sodium: 139 mmol/L (ref 134–144)
eGFR: 78 mL/min/{1.73_m2} (ref >59)

## 2023-12-19 NOTE — Progress Notes (Signed)
Internal Medicine Clinic Attending  Case discussed with the resident at the time of the visit.  We reviewed the resident's history and exam and pertinent patient test results.  I agree with the assessment, diagnosis, and plan of care documented in the resident's note.

## 2023-12-26 NOTE — Addendum Note (Signed)
Addended by: Lucille Passy on: 12/26/2023 09:36 AM   Modules accepted: Orders

## 2024-01-08 ENCOUNTER — Telehealth: Payer: Self-pay

## 2024-01-08 NOTE — Telephone Encounter (Signed)
Patient identification verified by 2 forms. Marilynn Rail, RN    Called and spoke to patient  Patient states:   -Received Rx Nervive Nerve relief tablets for his arm and shoulder pain   -would like to know if Rx interfered with any of his cardiac medications   -does not need an appointment for arm/shoulder pain  Informed patient message sent to pharmacy for assistance  Patient has no further questions at this time

## 2024-01-09 NOTE — Telephone Encounter (Signed)
[name], [name], [facility]  You1 hour ago (7:52 AM)    Should be fine to try    Patient identification verified by 2 forms. Marilynn Rail, RN    Called and spoke to patient  Relayed pharmacy message  Patient verbalized understanding, no questions at this time

## 2024-04-03 ENCOUNTER — Other Ambulatory Visit: Payer: Self-pay | Admitting: Cardiovascular Disease

## 2024-04-03 ENCOUNTER — Other Ambulatory Visit: Payer: Self-pay

## 2024-04-03 MED ORDER — FUROSEMIDE 40 MG PO TABS: 40.0000 mg | ORAL_TABLET | Freq: Two times a day (BID) | ORAL | 0 refills | Status: DC

## 2024-04-28 ENCOUNTER — Encounter: Payer: Self-pay | Admitting: Cardiovascular Disease

## 2024-04-28 ENCOUNTER — Telehealth: Payer: Self-pay | Admitting: Physician Assistant

## 2024-04-28 DIAGNOSIS — Z760 Encounter for issue of repeat prescription: Secondary | ICD-10-CM

## 2024-04-28 MED ORDER — METOPROLOL TARTRATE 50 MG PO TABS: 50.0000 mg | ORAL_TABLET | Freq: Two times a day (BID) | ORAL | 0 refills | Status: DC

## 2024-04-28 NOTE — Progress Notes (Signed)
   Thank you for the details you included in the comment boxes. Those details are very helpful in determining the best course of treatment for you and help us  to provide the best care.Because of requesting medication refills, we recommend that you schedule a Virtual Urgent Care video visit in order for the provider to better assess what is going on.  The provider will be able to give you a more accurate diagnosis and treatment plan if we can more freely discuss your symptoms and with the addition of a virtual examination.   If you change your visit to a video visit, we will bill your insurance (similar to an office visit) and you will not be charged for this e-Visit. You will be able to stay at home and speak with the first available [facility] advanced practice provider. The link to do a video visit is in the drop down Menu tab of your Welcome screen in MyChart.        I have spent 5 minutes in review of e-visit questionnaire, review and updating patient chart, medical decision making and response to patient.   Angelia Kelp, PA-C

## 2024-06-03 ENCOUNTER — Other Ambulatory Visit: Payer: Self-pay

## 2024-06-03 ENCOUNTER — Encounter: Payer: Self-pay | Admitting: Cardiovascular Disease

## 2024-06-03 MED ORDER — APIXABAN 5 MG PO TABS: 5.0000 mg | ORAL_TABLET | Freq: Two times a day (BID) | ORAL | 3 refills | Status: DC

## 2024-06-03 NOTE — Telephone Encounter (Signed)
 Prescription refill request for Eliquis  received. Indication:aflutter Last office visit:upcoming Scr:1.12  1/25 Age: 53 Weight:126.8  kg  Prescription refilled

## 2024-06-29 NOTE — Progress Notes (Deleted)
 Cardiology Office Note:  .   Date:  06/29/2024  ID:  Anthony Byrd, DOB [birth year], MRN [id number] PCP: Elicia Sharper, DO  [facility] Providers Cardiologist:  Darryle ONEIDA Decent, MD { Click to update primary MD,subspecialty MD or APP then REFRESH:1}   History of Present Illness: .   No chief complaint on file.   Anthony Byrd is a 53 y.o. male with history of MV repair, HFpEF, HTN, OSA who presents for follow-up.      Problem List Severe MR -s/p MV repair/MAZE @ [facility] 12/12/2021 -normal coronaries  Persistent atrial fibrillation -s/p MAZE 12/12/2021 -Atypical AFL TEE/DCCV 12/27/2021 -CHADSVASC=2 (HTN, CHF) HFpEF -EF 50-55% -EF 70-75% 05/2022 HTN OSA    ROS: All other ROS reviewed and negative. Pertinent positives noted in the HPI.     Studies Reviewed: SABRA       TTE 06/15/2022  1. Left ventricular ejection fraction, by estimation, is 70 to 75%. The  left ventricle has hyperdynamic function. The left ventricle has no  regional wall motion abnormalities. There is mild left ventricular  hypertrophy. Left ventricular diastolic  parameters are consistent with Grade II diastolic dysfunction  (pseudonormalization). The average left ventricular global longitudinal  strain is -18.6 %. The global longitudinal strain is normal.   2. Right ventricular systolic function is normal. The right ventricular  size is normal. There is normal pulmonary artery systolic pressure. The  estimated right ventricular systolic pressure is 23.8 mmHg.   3. The mitral valve has been repaired/replaced. Mild to moderate mitral  valve regurgitation. No evidence of mitral stenosis. The mean mitral valve  gradient is 7.5 mmHg. There is a prosthetic annuloplasty ring present in  the mitral position. Procedure  Date: 12/15/21.   4. The aortic valve is calcified. There is moderate calcification of the  aortic valve. There is mild thickening of the aortic valve. Aortic valve  regurgitation is mild. Mild  aortic valve stenosis. Aortic valve mean  gradient measures 13.5 mmHg. Aortic  valve Vmax measures 2.49 m/s.   5. Aortic dilatation noted. There is mild dilatation of the aortic root,  measuring 42 mm.   6. The inferior vena cava is normal in size with greater than 50%  respiratory variability, suggesting right atrial pressure of 3 mmHg.  Physical Exam:   VS:  There were no vitals taken for this visit.   Wt Readings from Last 3 Encounters:  12/18/23 279 lb 8 oz (126.8 kg)  10/06/22 241 lb 9.6 oz (109.6 kg)  07/12/22 245 lb 8 oz (111.4 kg)    GEN: Well nourished, well developed in no acute distress NECK: No JVD; No carotid bruits CARDIAC: ***RRR, no murmurs, rubs, gallops RESPIRATORY:  Clear to auscultation without rales, wheezing or rhonchi  ABDOMEN: Soft, non-tender, non-distended EXTREMITIES:  No edema; No deformity  ASSESSMENT AND PLAN: .   ***    {Are you ordering a CV Procedure (e.g. stress test, cath, DCCV, TEE, etc)?   Press F2        :[id number]}   Follow-up: No follow-ups on file.  Time Spent with Patient: I have spent a total of *** minutes caring for this patient today face to face, ordering and reviewing labs/tests, reviewing prior records/medical history, examining the patient, establishing an assessment and plan, communicating results/findings to the patient/family, and documenting in the medical record.   Signed, Darryle ONEIDA. Decent, MD, [facility]  [facility]  [address] [place], [place] [address] [phone]  7:04 PM

## 2024-06-30 ENCOUNTER — Ambulatory Visit: Payer: Self-pay | Attending: Cardiovascular Disease | Admitting: Cardiovascular Disease

## 2024-06-30 DIAGNOSIS — I340 Nonrheumatic mitral (valve) insufficiency: Secondary | ICD-10-CM

## 2024-06-30 DIAGNOSIS — I5032 Chronic diastolic (congestive) heart failure: Secondary | ICD-10-CM

## 2024-06-30 DIAGNOSIS — I4819 Other persistent atrial fibrillation: Secondary | ICD-10-CM

## 2024-06-30 DIAGNOSIS — Z9889 Other specified postprocedural states: Secondary | ICD-10-CM

## 2024-07-07 ENCOUNTER — Other Ambulatory Visit: Payer: Self-pay | Admitting: Cardiovascular Disease

## 2024-07-11 ENCOUNTER — Other Ambulatory Visit: Payer: Self-pay | Admitting: Cardiovascular Disease

## 2024-07-14 ENCOUNTER — Encounter: Payer: Self-pay | Admitting: Cardiovascular Disease

## 2024-07-14 ENCOUNTER — Ambulatory Visit: Payer: Self-pay

## 2024-07-14 ENCOUNTER — Other Ambulatory Visit: Payer: Self-pay | Admitting: Cardiovascular Disease

## 2024-07-14 NOTE — Telephone Encounter (Signed)
 FYI Only or Action Required?: FYI only for provider.  Patient was last seen in primary care on 12/18/2023 by Kenn Pagan, DO.  Called Nurse Triage reporting Palpitations.  Symptoms began a week ago.  Interventions attempted: Nothing.  Symptoms are: stable.  Triage Disposition: See HCP Within 4 Hours (Or PCP Triage)  Patient/caregiver understands and will follow disposition?: Yes, no available OV, pt agreeable to UC today        Reason for Disposition  [1] MILD difficulty breathing (e.g., minimal/no SOB at rest, SOB with walking, pulse < 100) AND [2] NEW-onset or WORSE than normal  Answer Assessment - Initial Assessment Questions 1. RESPIRATORY STATUS: Describe your breathing? (e.g., wheezing, shortness of breath, unable to speak, severe coughing)      Shortness of breath 2. ONSET: When did this breathing problem begin?      X 1 week 3. PATTERN Does the difficult breathing come and go, or has it been constant since it started?      Constant 4. SEVERITY: How bad is your breathing? (e.g., mild, moderate, severe)      Mild 5. RECURRENT SYMPTOM: Have you had difficulty breathing before? If Yes, ask: When was the last time? and What happened that time?      Yes, had same symptom when diagnosed with A-fib 6. CARDIAC HISTORY: Do you have any history of heart disease? (e.g., heart attack, angina, bypass surgery, angioplasty)      Valve repair 3 years ago 7. LUNG HISTORY: Do you have any history of lung disease?  (e.g., pulmonary embolus, asthma, emphysema)     None 8. CAUSE: What do you think is causing the breathing problem?      Possible A-fib 9. OTHER SYMPTOMS: Do you have any other symptoms? (e.g., chest pain, cough, dizziness, fever, runny nose)     None  Protocols used: Breathing Difficulty-A-AH

## 2024-07-16 ENCOUNTER — Other Ambulatory Visit: Payer: Self-pay | Admitting: Cardiovascular Disease

## 2024-07-28 ENCOUNTER — Other Ambulatory Visit: Payer: Self-pay | Admitting: Cardiovascular Disease

## 2024-07-29 ENCOUNTER — Other Ambulatory Visit: Payer: Self-pay

## 2024-07-29 MED ORDER — FUROSEMIDE 40 MG PO TABS: 40.0000 mg | ORAL_TABLET | Freq: Two times a day (BID) | ORAL | 0 refills | Status: DC

## 2024-08-12 ENCOUNTER — Telehealth: Payer: Self-pay | Admitting: Physician Assistant

## 2024-08-12 DIAGNOSIS — Z8679 Personal history of other diseases of the circulatory system: Secondary | ICD-10-CM

## 2024-08-12 DIAGNOSIS — R0602 Shortness of breath: Secondary | ICD-10-CM

## 2024-08-12 DIAGNOSIS — R0609 Other forms of dyspnea: Secondary | ICD-10-CM

## 2024-08-12 DIAGNOSIS — R5383 Other fatigue: Secondary | ICD-10-CM

## 2024-08-12 NOTE — Progress Notes (Signed)
 Virtual Visit Consent   ARK [facility], you are scheduled for a virtual visit with a [facility] provider today. Just as with appointments in the office, your consent must be obtained to participate. Your consent will be active for this visit and any virtual visit you may have with one of our providers in the next 365 days. If you have a MyChart account, a copy of this consent can be sent to you electronically.  As this is a virtual visit, video technology does not allow for your provider to perform a traditional examination. This may limit your provider's ability to fully assess your condition. If your provider identifies any concerns that need to be evaluated in person or the need to arrange testing (such as labs, EKG, etc.), we will make arrangements to do so. Although advances in technology are sophisticated, we cannot ensure that it will always work on either your end or our end. If the connection with a video visit is poor, the visit may have to be switched to a telephone visit. With either a video or telephone visit, we are not always able to ensure that we have a secure connection.  By engaging in this virtual visit, you consent to the provision of healthcare and authorize for your insurance to be billed (if applicable) for the services provided during this visit. Depending on your insurance coverage, you may receive a charge related to this service.  I need to obtain your verbal consent now. Are you willing to proceed with your visit today? Anthony Byrd has provided verbal consent on 08/12/2024 for a virtual visit (video or telephone). Anthony [name], PA-C  Date: 08/12/2024 12:28 PM   Virtual Visit via Video Note   I, Anthony Byrd, connected with  Anthony Byrd  ([id number], 1971/03/14) on 08/12/24 at 12:15 PM EDT by a video-enabled telemedicine application and verified that I am speaking with the correct person using two identifiers.  Location: Patient: Virtual Visit Location  Patient: Mobile Provider: Virtual Visit Location Provider: Home Office   I discussed the limitations of evaluation and management by telemedicine and the availability of in person appointments. The patient expressed understanding and agreed to proceed.    History of Present Illness: Anthony Byrd is a 53 y.o. who identifies as a male who was assigned male at birth, and is being seen today for shortness of breath, dyspnea on exertion, and fatigue. Reports it feels exactly the same as when he was in A.Fib/flutter previously and had to have a cardioversion.   Problems:  Patient Active Problem List   Diagnosis Date Noted   Separated shoulder, left, subsequent encounter 12/18/2023   Elevated blood pressure reading 12/18/2023   Chronic venous insufficiency of lower extremity 07/12/2022   Lump in chest 07/12/2022   Left hamstring muscle strain 04/27/2022   CKD (chronic kidney disease), stage II 12/28/2021   Atrial flutter (HCC) 12/23/2021   Typical atrial flutter (HCC)    Nonrheumatic mitral valve regurgitation    Acute diastolic CHF (congestive heart failure), NYHA class 4 (HCC)    Severe mitral regurgitation s/p MVR    Atrial fibrillation with RVR (HCC) 12/03/2021   DOE (dyspnea on exertion) 12/02/2021   Left knee pain 12/02/2021   AKI (acute kidney injury) 12/01/2021   Lymphadenopathy of left cervical region 09/30/2021   CHF (congestive heart failure) (HCC) 01/28/2021   Healthcare maintenance 01/28/2021   Right shoulder pain 01/28/2021   Left ankle pain 01/28/2021   Tremor 07/15/2020  Acute on chronic diastolic (congestive) heart failure (HCC)    Cough 06/28/2020   Polycythemia    Hypokalemia    Hematuria 12/03/2018   OSA (obstructive sleep apnea) 11/25/2018   Atrial fibrillation (HCC) 11/23/2018    Allergies:  Allergies  Allergen Reactions   Peanut-Containing Drug Products Nausea And Vomiting    Pt reports tolerance to small amount of peanut now   Medications:  Current  Outpatient Medications:    acetaminophen  (TYLENOL ) 500 MG tablet, Take 1,000 mg by mouth every 6 (six) hours as needed for mild pain., Disp: , Rfl:    apixaban  (ELIQUIS ) 5 MG TABS tablet, Take 1 tablet (5 mg total) by mouth 2 (two) times daily., Disp: 60 tablet, Rfl: 3   furosemide  (LASIX ) 40 MG tablet, Take 1 tablet (40 mg total) by mouth 2 (two) times daily., Disp: 180 tablet, Rfl: 0   metoprolol  tartrate (LOPRESSOR ) 50 MG tablet, TAKE 1 TABLET BY MOUTH 2 TIMES A DAY, Disp: 180 tablet, Rfl: 0   spironolactone  (ALDACTONE ) 25 MG tablet, Take 1 tablet (25 mg total) by mouth daily. NEED OV., Disp: 90 tablet, Rfl: 3  Observations/Objective: Patient is well-developed, well-nourished in no acute distress.  Resting comfortably  Head is normocephalic, atraumatic.  No labored breathing.  Speech is clear and coherent with logical content.  Patient is alert and oriented at baseline.    Assessment and Plan: 1. SOB (shortness of breath) (Primary)  2. DOE (dyspnea on exertion)  3. Other fatigue  4. History of atrial fibrillation  - Patient with symptoms c/w previous A. Fib - Advised to seek care in person at closest ER facility for EKG and further work up as needed  Follow Up Instructions: I discussed the assessment and treatment plan with the patient. The patient was provided an opportunity to ask questions and all were answered. The patient agreed with the plan and demonstrated an understanding of the instructions.  A copy of instructions were sent to the patient via MyChart unless otherwise noted below.    The patient was advised to call back or seek an in-person evaluation if the symptoms worsen or if the condition fails to improve as anticipated.    Anthony [name], PA-C

## 2024-08-12 NOTE — Patient Instructions (Signed)
  Anthony Byrd, thank you for joining [name], PA-C for today's virtual visit.  While this provider is not your primary care provider (PCP), if your PCP is located in our provider database this encounter information will be shared with them immediately following your visit.   A [facility] MyChart account gives you access to today's visit and all your visits, tests, and labs performed at [facility]  click here if you don't have a [facility] MyChart account or go to [email or web address]  Consent: (Patient) Anthony Byrd provided verbal consent for this virtual visit at the beginning of the encounter.  Current Medications:  Current Outpatient Medications:    acetaminophen  (TYLENOL ) 500 MG tablet, Take 1,000 mg by mouth every 6 (six) hours as needed for mild pain., Disp: , Rfl:    apixaban  (ELIQUIS ) 5 MG TABS tablet, Take 1 tablet (5 mg total) by mouth 2 (two) times daily., Disp: 60 tablet, Rfl: 3   furosemide  (LASIX ) 40 MG tablet, Take 1 tablet (40 mg total) by mouth 2 (two) times daily., Disp: 180 tablet, Rfl: 0   metoprolol  tartrate (LOPRESSOR ) 50 MG tablet, TAKE 1 TABLET BY MOUTH 2 TIMES A DAY, Disp: 180 tablet, Rfl: 0   spironolactone  (ALDACTONE ) 25 MG tablet, Take 1 tablet (25 mg total) by mouth daily. NEED OV., Disp: 90 tablet, Rfl: 3   Medications ordered in this encounter:  No orders of the defined types were placed in this encounter.    *If you need refills on other medications prior to your next appointment, please contact your pharmacy*  Follow-Up: Call back or seek an in-person evaluation if the symptoms worsen or if the condition fails to improve as anticipated.  [facility] [phone]  Other Instructions Seek in person care at closest ER facility   If you have been instructed to have an in-person evaluation today at a local Urgent Care facility, please use the link below. It will take you to a list of all of our available  [facility], including address, phone number and hours of operation. Please do not delay care.  [facility]  If you or a family member do not have a primary care provider, use the link below to schedule a visit and establish care. When you choose a [facility] primary care physician or advanced practice provider, you gain a long-term partner in health. Find a Primary Care Provider  Learn more about [facility]'s in-office and virtual care options:  - Get Care Now

## 2024-08-17 ENCOUNTER — Emergency Department (HOSPITAL_COMMUNITY): Payer: Self-pay

## 2024-08-17 ENCOUNTER — Inpatient Hospital Stay (HOSPITAL_COMMUNITY)
Admission: EM | Admit: 2024-08-17 | Discharge: 2024-08-22 | DRG: 291 | Disposition: A | Discharge status: Home / Self Care | Payer: MEDICAID | Attending: Internal Medicine | Admitting: Internal Medicine

## 2024-08-17 ENCOUNTER — Encounter (HOSPITAL_COMMUNITY): Payer: Self-pay | Admitting: *Deleted

## 2024-08-17 ENCOUNTER — Other Ambulatory Visit: Payer: Self-pay

## 2024-08-17 DIAGNOSIS — G4733 Obstructive sleep apnea (adult) (pediatric): Secondary | ICD-10-CM | POA: Diagnosis present

## 2024-08-17 DIAGNOSIS — Z59869 Financial insecurity, unspecified: Secondary | ICD-10-CM

## 2024-08-17 DIAGNOSIS — J810 Acute pulmonary edema: Secondary | ICD-10-CM | POA: Insufficient documentation

## 2024-08-17 DIAGNOSIS — T502X5A Adverse effect of carbonic-anhydrase inhibitors, benzothiadiazides and other diuretics, initial encounter: Secondary | ICD-10-CM | POA: Diagnosis present

## 2024-08-17 DIAGNOSIS — Z79899 Other long term (current) drug therapy: Secondary | ICD-10-CM

## 2024-08-17 DIAGNOSIS — Z91010 Allergy to peanuts: Secondary | ICD-10-CM

## 2024-08-17 DIAGNOSIS — N179 Acute kidney failure, unspecified: Secondary | ICD-10-CM | POA: Diagnosis present

## 2024-08-17 DIAGNOSIS — Z952 Presence of prosthetic heart valve: Secondary | ICD-10-CM

## 2024-08-17 DIAGNOSIS — Z635 Disruption of family by separation and divorce: Secondary | ICD-10-CM

## 2024-08-17 DIAGNOSIS — Z6841 Body Mass Index (BMI) 40.0 and over, adult: Secondary | ICD-10-CM

## 2024-08-17 DIAGNOSIS — Z8547 Personal history of malignant neoplasm of testis: Secondary | ICD-10-CM

## 2024-08-17 DIAGNOSIS — E876 Hypokalemia: Secondary | ICD-10-CM | POA: Diagnosis present

## 2024-08-17 DIAGNOSIS — Z7901 Long term (current) use of anticoagulants: Secondary | ICD-10-CM

## 2024-08-17 DIAGNOSIS — R7303 Prediabetes: Secondary | ICD-10-CM | POA: Diagnosis present

## 2024-08-17 DIAGNOSIS — I5033 Acute on chronic diastolic (congestive) heart failure: Principal | ICD-10-CM | POA: Diagnosis present

## 2024-08-17 DIAGNOSIS — I493 Ventricular premature depolarization: Secondary | ICD-10-CM | POA: Diagnosis present

## 2024-08-17 DIAGNOSIS — N289 Disorder of kidney and ureter, unspecified: Secondary | ICD-10-CM

## 2024-08-17 DIAGNOSIS — R0609 Other forms of dyspnea: Secondary | ICD-10-CM | POA: Diagnosis present

## 2024-08-17 DIAGNOSIS — R7989 Other specified abnormal findings of blood chemistry: Secondary | ICD-10-CM | POA: Insufficient documentation

## 2024-08-17 DIAGNOSIS — I340 Nonrheumatic mitral (valve) insufficiency: Secondary | ICD-10-CM | POA: Diagnosis present

## 2024-08-17 DIAGNOSIS — I110 Hypertensive heart disease with heart failure: Principal | ICD-10-CM | POA: Diagnosis present

## 2024-08-17 DIAGNOSIS — I483 Typical atrial flutter: Secondary | ICD-10-CM | POA: Diagnosis present

## 2024-08-17 DIAGNOSIS — I4819 Other persistent atrial fibrillation: Secondary | ICD-10-CM | POA: Diagnosis present

## 2024-08-17 DIAGNOSIS — Z7984 Long term (current) use of oral hypoglycemic drugs: Secondary | ICD-10-CM

## 2024-08-17 DIAGNOSIS — I4892 Unspecified atrial flutter: Secondary | ICD-10-CM | POA: Diagnosis present

## 2024-08-17 DIAGNOSIS — I351 Nonrheumatic aortic (valve) insufficiency: Secondary | ICD-10-CM | POA: Diagnosis present

## 2024-08-17 HISTORY — DX: Unspecified atrial fibrillation: I48.91

## 2024-08-17 LAB — BASIC METABOLIC PANEL WITH GFR
Anion gap: 14 (ref 5–15)
BUN: 19 mg/dL (ref 6–20)
CO2: 23 mmol/L (ref 22–32)
Calcium: 8.1 mg/dL — ABNORMAL LOW (ref 8.9–10.3)
Chloride: 100 mmol/L (ref 98–111)
Creatinine, Ser: 1.33 mg/dL — ABNORMAL HIGH (ref 0.61–1.24)
GFR, Estimated: >60 mL/min (ref >60)
Glucose, Bld: 96 mg/dL (ref 70–99)
Potassium: 3.4 mmol/L — ABNORMAL LOW (ref 3.5–5.1)
Sodium: 137 mmol/L (ref 135–145)

## 2024-08-17 LAB — CBC
HCT: 42.1 % (ref 39.0–52.0)
Hemoglobin: 13.3 g/dL (ref 13.0–17.0)
MCH: 27.4 pg (ref 26.0–34.0)
MCHC: 31.6 g/dL (ref 30.0–36.0)
MCV: 86.6 fL (ref 80.0–100.0)
Platelets: 255 K/uL (ref 150–400)
RBC: 4.86 MIL/uL (ref 4.22–5.81)
RDW: 15 % (ref 11.5–15.5)
WBC: 9.1 K/uL (ref 4.0–10.5)
nRBC: 0 % (ref 0.0–0.2)

## 2024-08-17 LAB — TROPONIN I (HIGH SENSITIVITY): Troponin I (High Sensitivity): 23 ng/L — ABNORMAL HIGH (ref <18)

## 2024-08-17 NOTE — ED Triage Notes (Signed)
 The pt reports that he has had shortness of breath for 2 weeks ago  he feels like  he has more fluid than usual and he has a non-productive cough

## 2024-08-18 ENCOUNTER — Inpatient Hospital Stay (HOSPITAL_COMMUNITY): Payer: Self-pay

## 2024-08-18 ENCOUNTER — Encounter (HOSPITAL_COMMUNITY): Payer: Self-pay | Admitting: Internal Medicine

## 2024-08-18 DIAGNOSIS — Z91010 Allergy to peanuts: Secondary | ICD-10-CM | POA: Diagnosis not present

## 2024-08-18 DIAGNOSIS — Z7901 Long term (current) use of anticoagulants: Secondary | ICD-10-CM | POA: Diagnosis not present

## 2024-08-18 DIAGNOSIS — G4733 Obstructive sleep apnea (adult) (pediatric): Secondary | ICD-10-CM

## 2024-08-18 DIAGNOSIS — T502X5A Adverse effect of carbonic-anhydrase inhibitors, benzothiadiazides and other diuretics, initial encounter: Secondary | ICD-10-CM | POA: Diagnosis present

## 2024-08-18 DIAGNOSIS — E876 Hypokalemia: Secondary | ICD-10-CM | POA: Diagnosis present

## 2024-08-18 DIAGNOSIS — I351 Nonrheumatic aortic (valve) insufficiency: Secondary | ICD-10-CM | POA: Diagnosis present

## 2024-08-18 DIAGNOSIS — Z9989 Dependence on other enabling machines and devices: Secondary | ICD-10-CM

## 2024-08-18 DIAGNOSIS — Z635 Disruption of family by separation and divorce: Secondary | ICD-10-CM | POA: Diagnosis not present

## 2024-08-18 DIAGNOSIS — J810 Acute pulmonary edema: Secondary | ICD-10-CM | POA: Insufficient documentation

## 2024-08-18 DIAGNOSIS — R000 Tachycardia, unspecified: Secondary | ICD-10-CM

## 2024-08-18 DIAGNOSIS — I4892 Unspecified atrial flutter: Secondary | ICD-10-CM

## 2024-08-18 DIAGNOSIS — R7303 Prediabetes: Secondary | ICD-10-CM

## 2024-08-18 DIAGNOSIS — I4819 Other persistent atrial fibrillation: Secondary | ICD-10-CM

## 2024-08-18 DIAGNOSIS — I5033 Acute on chronic diastolic (congestive) heart failure: Secondary | ICD-10-CM

## 2024-08-18 DIAGNOSIS — Z952 Presence of prosthetic heart valve: Secondary | ICD-10-CM | POA: Diagnosis not present

## 2024-08-18 DIAGNOSIS — Z6841 Body Mass Index (BMI) 40.0 and over, adult: Secondary | ICD-10-CM | POA: Diagnosis not present

## 2024-08-18 DIAGNOSIS — Z59869 Financial insecurity, unspecified: Secondary | ICD-10-CM | POA: Diagnosis not present

## 2024-08-18 DIAGNOSIS — I493 Ventricular premature depolarization: Secondary | ICD-10-CM | POA: Diagnosis present

## 2024-08-18 DIAGNOSIS — I483 Typical atrial flutter: Secondary | ICD-10-CM | POA: Diagnosis present

## 2024-08-18 DIAGNOSIS — I10 Essential (primary) hypertension: Secondary | ICD-10-CM

## 2024-08-18 DIAGNOSIS — I110 Hypertensive heart disease with heart failure: Secondary | ICD-10-CM | POA: Diagnosis present

## 2024-08-18 DIAGNOSIS — I5032 Chronic diastolic (congestive) heart failure: Secondary | ICD-10-CM

## 2024-08-18 DIAGNOSIS — Z8547 Personal history of malignant neoplasm of testis: Secondary | ICD-10-CM | POA: Diagnosis not present

## 2024-08-18 DIAGNOSIS — R7989 Other specified abnormal findings of blood chemistry: Secondary | ICD-10-CM | POA: Insufficient documentation

## 2024-08-18 DIAGNOSIS — N179 Acute kidney failure, unspecified: Secondary | ICD-10-CM | POA: Diagnosis present

## 2024-08-18 DIAGNOSIS — Z7984 Long term (current) use of oral hypoglycemic drugs: Secondary | ICD-10-CM | POA: Diagnosis not present

## 2024-08-18 DIAGNOSIS — Z79899 Other long term (current) drug therapy: Secondary | ICD-10-CM | POA: Diagnosis not present

## 2024-08-18 LAB — HEPATIC FUNCTION PANEL
ALT: 24 U/L (ref 0–44)
AST: 33 U/L (ref 15–41)
Albumin: 3.2 g/dL — ABNORMAL LOW (ref 3.5–5.0)
Alkaline Phosphatase: 51 U/L (ref 38–126)
Bilirubin, Direct: 0.2 mg/dL (ref 0.0–0.2)
Indirect Bilirubin: 0.8 mg/dL (ref 0.3–0.9)
Total Bilirubin: 1 mg/dL (ref 0.0–1.2)
Total Protein: 6.9 g/dL (ref 6.5–8.1)

## 2024-08-18 LAB — BASIC METABOLIC PANEL WITH GFR
Anion gap: 12 (ref 5–15)
BUN: 18 mg/dL (ref 6–20)
CO2: 22 mmol/L (ref 22–32)
Calcium: 8.1 mg/dL — ABNORMAL LOW (ref 8.9–10.3)
Chloride: 100 mmol/L (ref 98–111)
Creatinine, Ser: 1.35 mg/dL — ABNORMAL HIGH (ref 0.61–1.24)
GFR, Estimated: >60 mL/min (ref >60)
Glucose, Bld: 168 mg/dL — ABNORMAL HIGH (ref 70–99)
Potassium: 3.8 mmol/L (ref 3.5–5.1)
Sodium: 134 mmol/L — ABNORMAL LOW (ref 135–145)

## 2024-08-18 LAB — TSH: TSH: 1.499 u[IU]/mL (ref 0.350–4.500)

## 2024-08-18 LAB — ECHOCARDIOGRAM COMPLETE
AR max vel: 1.3 cm2
AV Area VTI: 1.32 cm2
AV Area mean vel: 1.29 cm2
AV Mean grad: 18 mmHg
AV Peak grad: 28.5 mmHg
Ao pk vel: 2.67 m/s
Area-P 1/2: 5 cm2
Calc EF: 41.3 %
Height: 68 in
MV VTI: 1.82 cm2
S' Lateral: 3.1 cm
Single Plane A2C EF: 35.3 %
Single Plane A4C EF: 43.2 %
Weight: 4472.69 [oz_av]

## 2024-08-18 LAB — HEMOGLOBIN A1C
Hgb A1c MFr Bld: 5.6 % (ref 4.8–5.6)
Mean Plasma Glucose: 114.02 mg/dL

## 2024-08-18 LAB — TROPONIN I (HIGH SENSITIVITY): Troponin I (High Sensitivity): 23 ng/L — ABNORMAL HIGH (ref <18)

## 2024-08-18 LAB — RESP PANEL BY RT-PCR (RSV, FLU A&B, COVID)  RVPGX2
Influenza A by PCR: NEGATIVE
Influenza B by PCR: NEGATIVE
Resp Syncytial Virus by PCR: NEGATIVE
SARS Coronavirus 2 by RT PCR: NEGATIVE

## 2024-08-18 LAB — HIV ANTIBODY (ROUTINE TESTING W REFLEX): HIV Screen 4th Generation wRfx: NONREACTIVE

## 2024-08-18 LAB — MAGNESIUM: Magnesium: 1.7 mg/dL (ref 1.7–2.4)

## 2024-08-18 LAB — BRAIN NATRIURETIC PEPTIDE: B Natriuretic Peptide: 41.8 pg/mL (ref 0.0–100.0)

## 2024-08-18 MED ORDER — APIXABAN 5 MG PO TABS
5.0000 mg | ORAL_TABLET | Freq: Two times a day (BID) | ORAL | Status: DC
Start: 2024-08-18 — End: 2024-08-22
  Administered 2024-08-18 – 2024-08-22 (×9): 5 mg via ORAL
  Filled 2024-08-18 (×9): qty 1

## 2024-08-18 MED ORDER — AMIODARONE HCL IN DEXTROSE 360-4.14 MG/200ML-% IV SOLN
30.0000 mg/h | INTRAVENOUS | Status: DC
  Administered 2024-08-18: 30 mg/h via INTRAVENOUS

## 2024-08-18 MED ORDER — METOPROLOL TARTRATE 25 MG PO TABS
25.0000 mg | ORAL_TABLET | Freq: Three times a day (TID) | ORAL | Status: DC
  Administered 2024-08-18 (×3): 25 mg via ORAL
  Filled 2024-08-18 (×3): qty 1

## 2024-08-18 MED ORDER — SENNA 8.6 MG PO TABS: 1.0000 | ORAL_TABLET | Freq: Every day | ORAL | Status: DC | PRN

## 2024-08-18 MED ORDER — AMIODARONE HCL IN DEXTROSE 360-4.14 MG/200ML-% IV SOLN
60.0000 mg/h | INTRAVENOUS | Status: DC
  Administered 2024-08-18 (×2): 60 mg/h via INTRAVENOUS
  Filled 2024-08-18 (×2): qty 200

## 2024-08-18 MED ORDER — POTASSIUM CHLORIDE CRYS ER 20 MEQ PO TBCR
40.0000 meq | EXTENDED_RELEASE_TABLET | Freq: Once | ORAL | Status: AC
  Administered 2024-08-18: 40 meq via ORAL
  Filled 2024-08-18: qty 2

## 2024-08-18 MED ORDER — AMIODARONE LOAD VIA INFUSION
150.0000 mg | Freq: Once | INTRAVENOUS | Status: AC
  Administered 2024-08-18: 150 mg via INTRAVENOUS
  Filled 2024-08-18: qty 83.34

## 2024-08-18 MED ORDER — PERFLUTREN LIPID MICROSPHERE
1.0000 mL | INTRAVENOUS | Status: AC | PRN
  Administered 2024-08-18: 2 mL via INTRAVENOUS

## 2024-08-18 MED ORDER — FUROSEMIDE 10 MG/ML IJ SOLN
40.0000 mg | Freq: Once | INTRAMUSCULAR | Status: AC
  Administered 2024-08-18: 40 mg via INTRAVENOUS
  Filled 2024-08-18: qty 4

## 2024-08-18 MED ORDER — FUROSEMIDE 10 MG/ML IJ SOLN
80.0000 mg | Freq: Two times a day (BID) | INTRAMUSCULAR | Status: DC
  Administered 2024-08-18: 80 mg via INTRAVENOUS
  Filled 2024-08-18: qty 8

## 2024-08-18 MED ORDER — MAGNESIUM SULFATE 2 GM/50ML IV SOLN
2.0000 g | Freq: Once | INTRAVENOUS | Status: AC
  Administered 2024-08-18: 2 g via INTRAVENOUS
  Filled 2024-08-18: qty 50

## 2024-08-18 MED ORDER — ACETAMINOPHEN 325 MG PO TABS
650.0000 mg | ORAL_TABLET | Freq: Four times a day (QID) | ORAL | Status: DC | PRN
  Administered 2024-08-19 – 2024-08-22 (×2): 650 mg via ORAL
  Filled 2024-08-18 (×2): qty 2

## 2024-08-18 MED ORDER — METOPROLOL TARTRATE 25 MG/10 ML ORAL SUSPENSION: 25.0000 mg | Freq: Three times a day (TID) | ORAL | Status: DC

## 2024-08-18 MED ORDER — FUROSEMIDE 10 MG/ML IJ SOLN
80.0000 mg | Freq: Three times a day (TID) | INTRAMUSCULAR | Status: DC
  Administered 2024-08-18: 80 mg via INTRAVENOUS
  Filled 2024-08-18: qty 8

## 2024-08-18 NOTE — Progress Notes (Addendum)
 HD#0 SUBJECTIVE:  Patient Summary: Anthony Byrd is a 53 y.o. with a pertinent PMH of atrial flutter, A-fib s/p maze procedure 12/2021, severe mitral regurgitation s/p MV repair 11/2021, HFpEF (05/2022 echo: EF 70 to 75%), OSA, who presented with progressive leg swelling and SOB for the past 2 weeks and admitted for A-flutter with RVR and acute on chronic heart failure exacerbation.   Overnight Events: admitted overnight   Interim History: Patient denied CP and palpitations, urination has improved after lasix . Re-stated HPI bedside: felt ill with cough 2 weeks ago and SOB on exertion, palpitations, and swelling followed. He denied history of smoking/vaping. 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 No prior Hx of COPD and Asthma.   OBJECTIVE:  Vital Signs: Vitals:   08/18/24 0635 08/18/24 0645 08/18/24 0700 08/18/24 0730  BP: 110/66 113/77 112/71 101/79  Pulse: 84 (!) 52 (!) 55 (!) 107  Resp: 17 (!) 26 (!) 26 (!) 22  Temp:      TempSrc:      SpO2: 98% 96% 95% 99%  Weight:      Height:       Supplemental O2: Room Air SpO2: 99 %  Filed Weights   08/17/24 2248  Weight: 126.8 kg     Intake/Output Summary (Last 24 hours) at 08/18/2024 0743 Last data filed at 08/18/2024 0353 Gross per 24 hour  Intake --  Output 520 ml  Net -520 ml   Net IO Since Admission: -520 mL [08/18/24 0743]  Physical Exam: Physical Exam Constitutional:      General: He is not in acute distress.    Appearance: He is not ill-appearing.  Cardiovascular:     Rate and Rhythm: Regular rhythm. Tachycardia present.     Heart sounds: No murmur heard.    No friction rub. No gallop.  Pulmonary:     Effort: Pulmonary effort is normal. No accessory muscle usage or respiratory distress.     Breath sounds: Normal breath sounds. No decreased breath sounds, wheezing, rhonchi or rales.  Musculoskeletal:     Right lower leg: Edema (1+) present.     Left lower leg: Edema (1+)  present.  Skin:    General: Skin is warm.  Neurological:     Mental Status: He is alert.     Patient Lines/Drains/Airways Status     Active Line/Drains/Airways     Name Placement date Placement time Site Days   Peripheral IV 08/18/24 20 G Anterior;Left Forearm 08/18/24  0235  Forearm  less than 1            Pertinent labs and imaging:      Latest Ref Rng & Units 08/17/2024   10:52 PM 02/16/2022   11:36 AM 01/10/2022   11:35 AM  CBC  WBC 4.0 - 10.5 K/uL 9.1  9.4  7.9   Hemoglobin 13.0 - 17.0 g/dL 86.6  85.6  86.9   Hematocrit 39.0 - 52.0 % 42.1  45.4  42.0   Platelets 150 - 400 K/uL 255  330  403        Latest Ref Rng & Units 08/18/2024   12:51 AM 08/17/2024   10:52 PM 12/18/2023   11:13 AM  CMP  Glucose 70 - 99 mg/dL  96  889   BUN 6 - 20 mg/dL  19  27   Creatinine 9.38 - 1.24 mg/dL  8.66  8.87   Sodium 864 - 145  mmol/L  137  139   Potassium 3.5 - 5.1 mmol/L  3.4  4.2   Chloride 98 - 111 mmol/L  100  98   CO2 22 - 32 mmol/L  23  22   Calcium 8.9 - 10.3 mg/dL  8.1  9.0   Total Protein 6.5 - 8.1 g/dL 6.9     Total Bilirubin 0.0 - 1.2 mg/dL 1.0     Alkaline Phos 38 - 126 U/L 51     AST 15 - 41 U/L 33     ALT 0 - 44 U/L 24      Mg pending Respiratory panel pending   DG Chest 2 View Result Date: 08/17/2024 EXAM: 2 VIEW(S) XRAY OF THE CHEST 08/17/2024 11:12:00 PM COMPARISON: X-ray 12/23/2021 CLINICAL HISTORY: cough sob feet and ankle swelling for 2 weeks on eliquis . Cough, shortness of breath, ankle swelling. FINDINGS: LUNGS AND PLEURA: No focal pulmonary opacity. Diffuse pulmonary edema. No pleural effusion. No pneumothorax. HEART AND MEDIASTINUM: Cardiomegaly. Prosthetic mitral valve. Left atrial appendage clip. BONES AND SOFT TISSUES: Surgical clips in upper abdomen. Intact sternotomy wires. IMPRESSION: 1. Cardiomegaly and pulmonary edema . Electronically signed by: Norman Gatlin MD 08/17/2024 11:25 PM EDT RP Workstation: [id number]    ASSESSMENT/PLAN:   Assessment: Principal Problem:   Atrial flutter with rapid ventricular response (HCC) Active Problems:   OSA (obstructive sleep apnea)   Acute on chronic diastolic heart failure (HCC)   DOE (dyspnea on exertion)   Severe mitral regurgitation s/p MVR   Elevated serum creatinine   Acute pulmonary edema (HCC)   Plan: A-flutter with RVR (cardioversion in 2023) A-fib (MAZE procedure 2023) MV repair (s/p 11/2021) Tachycardia BP 112/71.  Heart rate 107. States symptomatically he has improved. No CP, SOB, and less palpitations. Likely 2/2 viral infection 2 weeks ago. Respiratory viral panel negative. TSH WNL. Adherent to CPAP at home.  -cardiology consulted -On IV amiodarone  infusion  -Echo scheduled for today -restarted home metoprolol  at lower dose, cautioning with low-normal BP.  -Metoprolol  tartrate 12.5 TID. Will monitor BP -Continue Eliquis  5 mg twice daily  Acute on chronic HF exacerbation HFpEF (05/2022 EF 70-75%) Pulmonary edema Leg swelling SOB On exam, warm and wet. [place] LE edema, lungs clear to auscultation bilaterally.  Respiratory panel negative.  BNP unremarkable on admit.  Chest x-ray pulmonary edema and cardiomegaly. He is about 15 pounds over his dry weight. Ddx: acute on chronic HF exacerbation 2/2 afib/aflutter VS possible pulmonary hypertension 2/2 diastolic or systolic dysfunction Left heart.  Denies history of smoking or vaping.  Hepatic function panel unremarkable.    -Echo scheduled for today -80 mg Lasix  twice daily IV ordered -KCl 40 mEq x 2 given aggressive diuresis -BMP and Mg ordered for this afternoon -K goal >4,  -Mg goal > 2  Prediabetic A1c 6.0 12/02/2021.  Glucose less than 100 since admission - A1c ordered - Would benefit from Jardiance from heart failure perspective. Would also help with glycemic control if A1c has worsened.  HTN Blood pressure 113/72 Home medications include metoprolol  50 mg twice daily and spironolactone  25 mg daily. -  Added metoprolol  12.5 3 times daily for rate control as explained above, will up titrate or D/C based on blood pressure.  Elevated creatinine Creatinine on admission 1.33.  Baseline appears to be 1.0 - F/u BMP  OSA Adherent to CPAP at home - CPAP ordered for inpatient stay  Best Practice: Diet: Cardiac diet IVF: Fluids: none, Rate: None VTE: Apixaban  5 mg twice daily  Code: Full  Disposition planning: Therapy Recs: Home, DME: none DISPO: Anticipated discharge pending cardiology recommendations.  Signature:  Sallyanne Benuel [facility]  7:43 AM, 08/18/2024  On Call pager [phone]

## 2024-08-18 NOTE — Progress Notes (Signed)
   08/18/24 2029  BiPAP/CPAP/SIPAP  BiPAP/CPAP/SIPAP Pt Type Adult  Reason BIPAP/CPAP not in use Non-compliant  BiPAP/CPAP /SiPAP Vitals  Pulse Rate 91  Resp 19  SpO2 95 %  Bilateral Breath Sounds Clear;Diminished  MEWS Score/Color  MEWS Score 0  MEWS Score Color Anthony Byrd

## 2024-08-18 NOTE — Consult Note (Addendum)
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 [facility] Please consult [email or web address] for contact info under      Signed, Waddell DELENA Donath, PA-C  08/18/2024 5:37 PM   Patient seen and examined, note reviewed with the signed Resident Physician/Advanced Practice Provider. I personally reviewed laboratory data, imaging studies and relevant notes. I independently examined the patient and formulated the important aspects of the plan. I have personally discussed the plan with the patient and/or family. Comments or changes to the note/plan are indicated below.  Anthony Byrd is a 53 y.o. year-old male with history of severe MR s/p annuloplasty 11/2021, persistent Afib s/p MAZE 11/2021, atypical aflutter s/p DCCV 12/2021, HFpEF, and OSA presenting with volume overload and AFL w/ RVR 119. He reports worsening dyspnea and LE edema over the last few weeks despite doubling his lasix  dose.   Exam:  General: Well nourished, well developed, in no acute distress HEENT: Supple, JVD Cardiac: Normal S1, S2; RRR; no murmurs, rubs, or gallops Lungs: CTAB w/ no wheezing, rhonchi or rales  Abd: Soft, nontender, no hepatomegaly  Ext: 2+ LE edema Skin: Warm  and dry, no rashes   Neuro: AOA x 3  - Labs w/ elevated Cr.  - TTE w/ EF 45-50%, GH, RVSP 40, MG 7 mmHg, mild-mod AS, and mod-severe AS (EF 70-75%, mild AI on prior TTE)  Assessment & Plan: - Presenting of weeks of worsening DOE and LE edema and showings signs of volume overload. S/p IV lasix  40 x 1 and amio gtt for Aflutter but now back to NSR. Ddx for his ADHF includes worsening AI, Aflutter, and less likely ischemia (LHC prior to MVR w/ no CAD) - Increase IV lasix  to 80 TID - Cont metop 25 TID - Will need TEE to evaluate AI when euvolemic - Will initiate GDMT  for HFpEF prior to discharge  Signed, Joelle DEL. Ren Ny, MD [place]  [facility]  08/18/2024 5:53 PM

## 2024-08-18 NOTE — ED Notes (Signed)
 Echo at bedside

## 2024-08-18 NOTE — Evaluation (Signed)
 Physical Therapy Evaluation Patient Details Name: Anthony Byrd MRN: [id number] DOB: 12-09-1970 Today's Date: 08/18/2024  History of Present Illness  Pt is a 53 y/o M presenting on 08/17/24 with c/o progressive LE swelling & SOB x 2 weeks. Pt is being treated for a-flutter with RVR, a-fib, acute on chronic HF exacerbation. PMH: a-flutter, a-fib s/p MAZE procedure, severe mitral regurgitation s/p MVRepair, HFpEF, OSA  Clinical Impression  Pt seen for PT evaluation with pt agreeable. 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 On this date, pt is able to don crocs sitting EOB without LOB, ambulates to door & back but gait distances limited by MD rounding team present to assess pt & pt with elevated HR. HR up to 147 bpm with short distance gait, only decreased to ~131 bpm sitting EOB x 5 minutes - medical team in room & pt aware, pt with only c/o SOB. Will continue to follow pt acutely to progress gait & stair negotiation.         If plan is discharge home, recommend the following: Assistance with cooking/housework;Assist for transportation;Help with stairs or ramp for entrance   Can travel by private vehicle        Equipment Recommendations None recommended by PT  Recommendations for Other Services       Functional Status Assessment Patient has had a recent decline in their functional status and demonstrates the ability to make significant improvements in function in a reasonable and predictable amount of time.     Precautions / Restrictions Precautions Precautions: Fall Restrictions Weight Bearing Restrictions Per Provider Order: No      Mobility  Bed Mobility               General bed mobility comments: not tested, pt received & left sitting EOB    Transfers Overall transfer level: Modified independent Equipment used: None               General transfer comment: sit<>stand from ED  stretcher    Ambulation/Gait Ambulation/Gait assistance: Supervision Gait Distance (Feet): 10 Feet Assistive device: None Gait Pattern/deviations: Decreased stride length, Decreased step length - right, Decreased step length - left, Step-through pattern Gait velocity: decreased        Stairs            Wheelchair Mobility     Tilt Bed    Modified Rankin (Stroke Patients Only)       Balance Overall balance assessment: Mild deficits observed, not formally tested                                           Pertinent Vitals/Pain Pain Assessment Pain Assessment: No/denies pain    Home Living Family/patient expects to be discharged to:: Private residence Living Arrangements: Other relatives (sister) Available Help at Discharge: Family Type of Home: House Home Access: Stairs to enter Entrance Stairs-Rails: Right;Left;Can reach both Secretary/administrator of Steps: 4   Home Layout: One level Home Equipment: None      Prior Function Prior Level of Function : Working/employed;Driving             Mobility Comments: Independent without AD, working as a Gaffer, 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 ADLs Comments: Independent     Extremity/Trunk Assessment   Upper Extremity Assessment Upper Extremity Assessment: Overall WFL for tasks assessed  Lower Extremity Assessment Lower Extremity Assessment: Generalized weakness;RLE deficits/detail;LLE deficits/detail RLE Deficits / Details: BLE edema LLE Deficits / Details: BLE edema    Cervical / Trunk Assessment Cervical / Trunk Assessment: Normal  Communication   Communication Communication: No apparent difficulties    Cognition Arousal: Alert Behavior During Therapy: WFL for tasks assessed/performed, Flat affect   PT - Cognitive impairments: No apparent impairments                         Following commands: Intact       Cueing Cueing Techniques: Verbal cues     General Comments       Exercises     Assessment/Plan    PT Assessment Patient needs continued PT services  PT Problem List Decreased strength;Cardiopulmonary status limiting activity;Decreased balance;Decreased mobility;Decreased knowledge of precautions;Decreased safety awareness;Decreased activity tolerance       PT Treatment Interventions DME instruction;Balance training;Stair training;Neuromuscular re-education;Gait training;Patient/family education;Therapeutic exercise;Manual techniques;Functional mobility training    PT Goals (Current goals can be found in the Care Plan section)  Acute Rehab PT Goals Patient Stated Goal: get better PT Goal Formulation: With patient Time For Goal Achievement: 09/02/24 Potential to Achieve Goals: Good    Frequency Min 2X/week     Co-evaluation               AM-PAC PT 6 Clicks Mobility  Outcome Measure Help needed turning from your back to your side while in a flat bed without using bedrails?: None Help needed moving from lying on your back to sitting on the side of a flat bed without using bedrails?: A Little Help needed moving to and from a bed to a chair (including a wheelchair)?: A Little Help needed standing up from a chair using your arms (e.g., wheelchair or bedside chair)?: None Help needed to walk in hospital room?: A Little Help needed climbing 3-5 steps with a railing? : A Little 6 Click Score: 20    End of Session   Activity Tolerance: Treatment limited secondary to medical complications (Comment) (elevated HR) Patient left: in bed;with call bell/phone within reach   PT Visit Diagnosis: Muscle weakness (generalized) (M62.81);Other abnormalities of gait and mobility (R26.89)    Time: 9074-9060 PT Time Calculation (min) (ACUTE ONLY): 14 min   Charges:   PT Evaluation $PT Eval Moderate Complexity: 1 Mod   PT General Charges $$ ACUTE PT VISIT: 1 Visit         Anthony Byrd, PT, DPT 08/18/24, 10:38 AM   Anthony CHRISTELLA Byrd 08/18/2024, 10:36 AM

## 2024-08-18 NOTE — ED Notes (Signed)
 Pulse ox dropped to 95% RA while ambulating. Pt became sob and used his inhaler as soon as he was back on the stretcher. Pt also concerned about heart rate. While ambulating HR was 150. EDP advised.

## 2024-08-18 NOTE — ED Notes (Signed)
 Breakfast tray ordered

## 2024-08-18 NOTE — ED Provider Notes (Signed)
 [facility] AT [facility] Provider Note   CSN: [id number] Arrival date & time: 08/17/24  2229     Patient presents with: Shortness of Breath   Anthony Byrd is a 53 y.o. male.   The history is provided by the patient.  Shortness of Breath  He has history of chronic diastolic heart failure, atrial fibrillation anticoagulated on apixaban , mitral valve replacement and comes in because of progressive leg swelling and shortness of breath over the last 2 weeks.  He noticed that he was having less urine output and increased leg swelling about 2 weeks ago.  About 1 week ago, he started having increasing shortness of breath.  He has also noted orthopnea.  He got to the point where he could not take it anymore at home.  He denies chest pain, heaviness, tightness, pressure.    Prior to Admission medications   Medication Sig Start Date End Date Taking? Authorizing Provider  acetaminophen  (TYLENOL ) 500 MG tablet Take 1,000 mg by mouth every 6 (six) hours as needed for mild pain.    [provider]  apixaban  (ELIQUIS ) 5 MG TABS tablet Take 1 tablet (5 mg total) by mouth 2 (two) times daily. 06/03/24   O'NealDarryle Ned, MD  furosemide  (LASIX ) 40 MG tablet Take 1 tablet (40 mg total) by mouth 2 (two) times daily. 07/29/24   O'NealDarryle Ned, MD  metoprolol  tartrate (LOPRESSOR ) 50 MG tablet TAKE 1 TABLET BY MOUTH 2 TIMES A DAY 07/29/24   O'Neal, Darryle Ned, MD  spironolactone  (ALDACTONE ) 25 MG tablet Take 1 tablet (25 mg total) by mouth daily. NEED OV. 12/18/23   Masters, Izetta, DO    Allergies: Peanut-containing drug products    Review of Systems  Respiratory:  Positive for shortness of breath.   All other systems reviewed and are negative.   Updated Vital Signs BP 117/81 (BP Location: Right Arm)   Pulse 67   Temp 98.5 F (36.9 C)   Resp 20   Ht 5' 8 (1.727 m)   Wt 126.8 kg   SpO2 95%   BMI 42.50 kg/m   Physical Exam Vitals and nursing  note reviewed.   53 year old male, resting comfortably and in no acute distress. Vital signs are normal. Oxygen  saturation is 95%, which is normal. Head is normocephalic and atraumatic. PERRLA, EOMI. Oropharynx is clear. Neck is nontender and supple without adenopathy.  JVD is present. Back is nontender and there is no CVA tenderness.  There is 1-2+ presacral edema. Lungs are clear without rales, wheezes, or rhonchi. Chest is nontender. Heart has regular rate and rhythm without murmur. Abdomen is soft, flat, nontender. Extremities have 2+ edema, full range of motion is present. Skin is warm and dry without rash. Neurologic: Mental status is normal, cranial nerves are intact, moves all extremities equally.   (all labs ordered are listed, but only abnormal results are displayed) Labs Reviewed  BASIC METABOLIC PANEL WITH GFR - Abnormal; Notable for the following components:      Result Value   Potassium 3.4 (*)    Creatinine, Ser 1.33 (*)    Calcium 8.1 (*)    All other components within normal limits  TROPONIN I (HIGH SENSITIVITY) - Abnormal; Notable for the following components:   Troponin I (High Sensitivity) 23 (*)    All other components within normal limits  CBC  BRAIN NATRIURETIC PEPTIDE  TROPONIN I (HIGH SENSITIVITY)    EKG: None  Radiology: DG Chest  2 View Result Date: 08/17/2024 EXAM: 2 VIEW(S) XRAY OF THE CHEST 08/17/2024 11:12:00 PM COMPARISON: X-ray 12/23/2021 CLINICAL HISTORY: cough sob feet and ankle swelling for 2 weeks on eliquis . Cough, shortness of breath, ankle swelling. FINDINGS: LUNGS AND PLEURA: No focal pulmonary opacity. Diffuse pulmonary edema. No pleural effusion. No pneumothorax. HEART AND MEDIASTINUM: Cardiomegaly. Prosthetic mitral valve. Left atrial appendage clip. BONES AND SOFT TISSUES: Surgical clips in upper abdomen. Intact sternotomy wires. IMPRESSION: 1. Cardiomegaly and pulmonary edema . Electronically signed by: Norman Gatlin MD 08/17/2024  11:25 PM EDT RP Workstation: [id number]     Procedures   Medications Ordered in the ED - No data to display                                  Medical Decision Making Amount and/or Complexity of Data Reviewed Labs: ordered.  Risk Prescription drug management. Decision regarding hospitalization.   Progressive leg swelling and dyspnea in the setting of known heart failure.  This is appears to be an exacerbation of his diastolic heart failure.  I have reviewed his past records, and echocardiogram on 06/15/2022 showed left ventricular ejection fraction 70-75% and grade 2 diastolic dysfunction.  Cardiology office visit on 10/06/2022 stated no edema, no office visits since then.  I have reviewed his laboratory test, my interpretation is borderline hypokalemia, mild renal insufficiency not significantly changed from prior values, borderline elevated troponin which is likely secondary to demand ischemia-Will need repeat troponin, normal CBC.  Chest x-ray shows cardiomegaly and pulmonary edema.  I have independently viewed the images, and agree with the radiologist's interpretation.  I have ordered oral potassium.  He had good diuresis with furosemide  and was comfortable at rest.  However, when I attempted to have him ambulate, he got dyspneic with walking a short distance even though his oxygen  saturation remained stable.  Also, his heart rate increased to 140.  I do not feel he is safe for discharge.  He may need adjustment of his rate control medications in addition to additional diuresis.  I have discussed case with Dr. Kandis of internal medicine teaching service who agrees to admit the patient.     Final diagnoses:  Acute on chronic diastolic heart failure (HCC)  Typical atrial flutter (HCC)  Hypokalemia  Renal insufficiency  Elevated troponin    ED Discharge Orders     None          Raford Lenis, MD 08/18/24 [phone]

## 2024-08-18 NOTE — Plan of Care (Signed)

## 2024-08-18 NOTE — ED Notes (Signed)
 Pt ambulated to restroom without assistance.

## 2024-08-18 NOTE — ED Notes (Signed)
 Ccmd called

## 2024-08-18 NOTE — Hospital Course (Addendum)
           Afib with RVR (cardioversion in 2023) A-fib (MAZE procedure 2023)    Acute on chronic HF exacerbation in the setting of atrial flutter HFpEF (05/2022 EF 70-75% --> 9/30 EF 40-50%) MV repair (s/p 11/2021)   Prediabetic  HTN   Elevated creatinine   OSA

## 2024-08-18 NOTE — ED Notes (Signed)
 Urinal at bedside. Pt declines SOB at this moment

## 2024-08-18 NOTE — ED Notes (Signed)
 Internal medicine at bedside

## 2024-08-18 NOTE — H&P (Addendum)
 Date: 08/18/2024               Patient Name:  Anthony Byrd MRN: [id number]  DOB: Feb 07, 1971 Age / Sex: 53 y.o., male   PCP: Elicia Sharper, DO         Medical Service: Internal Medicine Teaching Service         Attending Physician: Dr. MICAEL Riis Winfrey      First Contact: Doyal Miyamoto, MD    Second Contact: Dr. Damien Lease, DO         Pager Information: First Contact Pager: [phone]   Second Contact Pager: [phone]   SUBJECTIVE   Chief Complaint: Shortness of breath   History of Present Illness: Anthony Byrd is a 53 y.o. male with a history of typical atrial flutter, A-Fib s/p MAZE procedure (12/2021), severe mitral regurgitation s/p MVRepair (11/2021), HFpEF (05/2022 Echo: EF 70-75%), and OSA who presents to [facility] ED with progressive leg swelling and SOB over the last 2 weeks.  Patient states that about 2 weeks ago he began having a cough which was productive of clear and yellow sputum. He thinks that after that point, he began having a feeling of rapid heart rate and then shortness of breath. Soon thereafter he began having swelling in his legs that have also been worsening. He denies any other symptoms of URI other than the cough. He does take Lasix  40 mg daily, but felt that around 2 weeks ago he was urinating less with his Lasix  and it was not pulling off his fluid well. He denies any changes to his diet recently, but does endorse canned foods, though nothing out of his ordinary. He notes that the shortness of breath is mainly with ambulation. He denies any orthopnea. He reports complete adherence to his prescription medications and denies missing any doses. He took all of his medications including PM meds on 9/28. He reports that he can feel his heart beating fast, but denies palpitations and states he has never been able to feel them. He denies taking any OTC medications for his symptoms other than Tylenol  and cough drops. He estimates his dry weight to be 265 lbs.   He did undergo  MAZE procedure in 12/2021 and MVRepar in 11/2021 and cardioversion and has been normal up until now.   ROS: Denies headaches, fever, chills, runny nose, sore throat, vision changes, hearing changes, chest pain, nausea, vomiting, abdominal pain. Denies increased urinary frequency, pain with urination, constipation or diarrhea. No recent falls. He endorses cough productive of clear-yellow sputum, dizziness, shortness of breath, difficulty breathing while ambulating, and rapid heart rate.    ED Course: Vitals: 130/86, pulse (!) 125, temperature 98.4 F (36.9 C), temperature source Oral, resp. rate 18, height 5' 8 (1.727 m), weight 126.8 kg, SpO2 96% Labs: K 3.4, Cr 1.33, eGFR > 60, CBC wnl, BNP wnl 41.8, Trops 23 --> 23  Imaging: CXR showed Cardiomegaly and pulmonary edema  Received: Lasix  40 mg, KCl 40 mEq Consulted: IMTS   Meds:  Patient reported:  Eliquis  5 mg BID Lasix  40 mg BID Metoprolol  50 mg BID Spironolactone  25 mg daily   No outpatient medications have been marked as taking for the 08/17/24 encounter [facility] Encounter).    Past Medical History atrial flutter, A-Fib s/p MAZE procedure (12/2021), severe mitral regurgitation s/p MVRepair (11/2021), HFpEF (05/2022 Echo: EF 70-75%), and OSA   Past Surgical History Past Surgical History:  Procedure Laterality Date   CARDIOVERSION N/A 11/26/2018   Procedure: CARDIOVERSION;  Surgeon: Alveta Aleene PARAS, MD;  Location: [facility] ENDOSCOPY;  Service: Cardiovascular;  Laterality: N/A;   CARDIOVERSION N/A 07/08/2020   Procedure: CARDIOVERSION;  Surgeon: Kate Lonni CROME, MD;  Location: [facility] ENDOSCOPY;  Service: Cardiovascular;  Laterality: N/A;   CARDIOVERSION N/A 08/17/2020   Procedure: CARDIOVERSION;  Surgeon: Raford Riggs, MD;  Location: [facility] ENDOSCOPY;  Service: Cardiovascular;  Laterality: N/A;   CARDIOVERSION N/A 12/07/2021   Procedure: CARDIOVERSION;  Surgeon: Shlomo Wilbert SAUNDERS, MD;  Location: [facility] ENDOSCOPY;  Service: Cardiovascular;   Laterality: N/A;   CARDIOVERSION N/A 12/27/2021   Procedure: CARDIOVERSION;  Surgeon: Raford Riggs, MD;  Location: [facility] ENDOSCOPY;  Service: Cardiovascular;  Laterality: N/A;   CARDIOVERSION N/A 02/22/2022   Procedure: CARDIOVERSION;  Surgeon: Alvan Ronal BRAVO, MD;  Location: [facility] ENDOSCOPY;  Service: Cardiovascular;  Laterality: N/A;   EXTERNAL FIXATION LEG Right 06/24/2016   Procedure: EXTERNAL FIXATION LEG;  Surgeon: Evalene JONETTA Chancy, MD;  Location: [facility] OR;  Service: Orthopedics;  Laterality: Right;   EXTERNAL FIXATION REMOVAL Right 06/27/2016   Procedure: REMOVAL EXTERNAL FIXATION LEG;  Surgeon: Evalene JONETTA Chancy, MD;  Location: [facility] OR;  Service: Orthopedics;  Laterality: Right;   ORIF ANKLE FRACTURE Left 07/03/2016   Procedure: OPEN REDUCTION INTERNAL FIXATION (ORIF) ANKLE FRACTURE; DRESSING CHANGE RIGHT LEG;  Surgeon: Evalene JONETTA Chancy, MD;  Location: [facility] OR;  Service: Orthopedics;  Laterality: Left;   ORIF TIBIA PLATEAU Right 06/27/2016   Procedure: OPEN REDUCTION INTERNAL FIXATION (ORIF) TIBIAL PLATEAU;  Surgeon: Evalene JONETTA Chancy, MD;  Location: [facility] OR;  Service: Orthopedics;  Laterality: Right;   RIGHT/LEFT HEART CATH AND CORONARY ANGIOGRAPHY N/A 12/05/2021   Procedure: RIGHT/LEFT HEART CATH AND CORONARY ANGIOGRAPHY;  Surgeon: Verlin Lonni JONETTA, MD;  Location: [facility] INVASIVE CV LAB;  Service: Cardiovascular;  Laterality: N/A;   SURGERY SCROTAL / TESTICULAR     TEE WITHOUT CARDIOVERSION N/A 11/26/2018   Procedure: TRANSESOPHAGEAL ECHOCARDIOGRAM (TEE);  Surgeon: Alveta Aleene PARAS, MD;  Location: [facility] ENDOSCOPY;  Service: Cardiovascular;  Laterality: N/A;   TEE WITHOUT CARDIOVERSION N/A 07/08/2020   Procedure: TRANSESOPHAGEAL ECHOCARDIOGRAM (TEE);  Surgeon: Kate Lonni CROME, MD;  Location: [facility] ENDOSCOPY;  Service: Cardiovascular;  Laterality: N/A;   TEE WITHOUT CARDIOVERSION N/A 12/07/2021   Procedure: TRANSESOPHAGEAL ECHOCARDIOGRAM (TEE);  Surgeon: Shlomo Wilbert SAUNDERS, MD;  Location: [facility] ENDOSCOPY;  Service:  Cardiovascular;  Laterality: N/A;   TEE WITHOUT CARDIOVERSION N/A 12/27/2021   Procedure: TRANSESOPHAGEAL ECHOCARDIOGRAM (TEE);  Surgeon: Raford Riggs, MD;  Location: [facility] ENDOSCOPY;  Service: Cardiovascular;  Laterality: N/A;     Social:  Lives With: did not obtain Occupation: did not obtain Support: Darin Velia Sharps: [phone] Level of Function:independent in ADLs and iADLs PCP:  Elicia Sharper, DO  Substances: - Tobacco: did not obtain - Alcohol: None - Recreational Drug: None  Family History:  Family History  Problem Relation Age of Onset   CAD Neg Hx    Stroke Neg Hx      Allergies: Allergies as of 08/17/2024 - Review Complete 08/17/2024  Allergen Reaction Noted   Peanut-containing drug products Nausea And Vomiting 06/23/2016    Review of Systems: A complete ROS was negative except as per HPI.   OBJECTIVE:   Physical Exam: Blood pressure 130/86, pulse (!) 125, temperature 98.4 F (36.9 C), temperature source Oral, resp. rate 18, height 5' 8 (1.727 m), weight 126.8 kg, SpO2 96%.   Constitutional: anasarcic-appearing male sitting in hospital bed, in no acute distress.  HEENT: normocephalic atraumatic, mucous membranes moist Eyes: conjunctiva non-erythematous Cardiovascular:tachycardic and irregular  rhythm, murmur present, bilateral radial pulses 2+, bilateral dorsal pedal pulses 2+, brisk capillary refill bilateral feet and hands, bilateral hands and feet warm to touch, hepatojugular reflux to level of the mandible, 2+ edema bilateral LE to the knee, 1+ bilateral LE edema to the hip   Pulmonary/Chest: normal work of breathing on room air, lungs clear to auscultation bilaterally, mildly diminished breath sounds in bilateral lung bases  Abdominal: soft, non-tender, non-distended, mild edema around lower pannus  MSK: normal bulk and tone. Neurological: alert & oriented x 3 Skin: warm and dry Psych: mood calm, behavior normal, thought content normal, judgement  normal    Labs: CBC    Component Value Date/Time   WBC 9.1 08/17/2024 2252   RBC 4.86 08/17/2024 2252   HGB 13.3 08/17/2024 2252   HGB 14.3 02/16/2022 1136   HCT 42.1 08/17/2024 2252   HCT 45.4 02/16/2022 1136   PLT 255 08/17/2024 2252   PLT 330 02/16/2022 1136   MCV 86.6 08/17/2024 2252   MCV 81 02/16/2022 1136   MCH 27.4 08/17/2024 2252   MCHC 31.6 08/17/2024 2252   RDW 15.0 08/17/2024 2252   RDW 14.6 02/16/2022 1136   LYMPHSABS 1.6 12/01/2021 1023   MONOABS 0.8 12/01/2021 1023   EOSABS 0.1 12/01/2021 1023   BASOSABS 0.1 12/01/2021 1023     CMP     Component Value Date/Time   NA 137 08/17/2024 2252   NA 139 12/18/2023 1113   K 3.4 (L) 08/17/2024 2252   CL 100 08/17/2024 2252   CO2 23 08/17/2024 2252   GLUCOSE 96 08/17/2024 2252   BUN 19 08/17/2024 2252   BUN 27 (H) 12/18/2023 1113   CREATININE 1.33 (H) 08/17/2024 2252   CALCIUM 8.1 (L) 08/17/2024 2252   PROT 6.7 12/01/2021 1023   PROT 7.2 07/16/2020 0857   ALBUMIN 3.2 (L) 12/01/2021 1023   ALBUMIN 3.8 07/16/2020 0857   AST 44 (H) 12/01/2021 1023   ALT 41 12/01/2021 1023   ALKPHOS 61 12/01/2021 1023   BILITOT 1.4 (H) 12/01/2021 1023   BILITOT 0.5 07/16/2020 0857   GFRNONAA >60 08/17/2024 2252   GFRAA 49 (L) 08/16/2020 1500    Imaging:  DG Chest 2 View Result Date: 08/17/2024 EXAM: 2 VIEW(S) XRAY OF THE CHEST 08/17/2024 11:12:00 PM COMPARISON: X-ray 12/23/2021 CLINICAL HISTORY: cough sob feet and ankle swelling for 2 weeks on eliquis . Cough, shortness of breath, ankle swelling. FINDINGS: LUNGS AND PLEURA: No focal pulmonary opacity. Diffuse pulmonary edema. No pleural effusion. No pneumothorax. HEART AND MEDIASTINUM: Cardiomegaly. Prosthetic mitral valve. Left atrial appendage clip. BONES AND SOFT TISSUES: Surgical clips in upper abdomen. Intact sternotomy wires. IMPRESSION: 1. Cardiomegaly and pulmonary edema . Electronically signed by: Norman Gatlin MD 08/17/2024 11:25 PM EDT RP Workstation: [id number]      EKG: personally reviewed my interpretation is A-flutter, irregular rhythm. Prior EKG with NSR and normal EKG.   ASSESSMENT & PLAN:   Assessment & Plan by Problem: Principal Problem:   Atrial flutter with rapid ventricular response (HCC) Active Problems:   OSA (obstructive sleep apnea)   Acute on chronic diastolic heart failure (HCC)   DOE (dyspnea on exertion)   Severe mitral regurgitation s/p MVR   Elevated serum creatinine   Acute pulmonary edema (HCC)   Anthony Byrd is a 53 y.o. person living with a history of typical atrial flutter, A-Fib s/p MAZE procedure (12/2021), severe mitral regurgitation s/p MVRepair (11/2021), HFpEF (05/2022 Echo: EF 70-75%), and OSA who presents with progressive  leg swelling and SOB over the last 2 weeks and admitted for A-flutter with RVR and acute on chronic heart failure exacerbation on hospital day 0  ##A-Flutter with RVR  #A-Fib #MVRepair  #Tachycardia  Patient has a history of A-Flutter s/p MAZE procedure (12/2021) and severe mitral regurgitation s/p MVRepair (11/2021) and cardioversion. He notes that he has been in his normal state since those procedures up until now. He is on Eliquis  5 mg BID and Metoprolol  50 mg BID at home which he reports complete adherence to and stats he took his PM dose on 9/28. Recommend consult to Cardiology in the AM. Given his sustained irregular rhythm and tachycardia, low-normal BP in the ED, and unsure of what his current EF is will avoid diltiazem  and beta blocker, and will place patient on Amiodarone  drip.  - IV Amiodarone  infusion  - Continue home Eliquis  5 mg BID  - recommend Cards consult in the AM  - f/u TSH - f/u LFTs - Telemetry cardiac monitoring  - avoid dilt and beta blocker at this time given unknown current EF and low-normal BP while diuresing   #Acute on Chronic HF Exacerbation #HFpEF (05/2022 Echo: EF 70-75%) #Pulmonary Edema  #Leg swelling #SOB Patient has a history of HFpEF, last Echo was 05/2022  which showed EF of 70-75%. He used to follow with Cardiology but has not seen them for some time. He does note a cough that began about 2 weeks ago which was productive of clear-yellow sputum, and he feels that this cough precipitated his rapid heart rate, and then SOB/leg swelling. BNP is wnl at 41.8. However, CXR shows cardiomegaly and pulmonary edema. On physical exam he is very volume overloaded with 2+ pitting edema to bilateral knees, 1+ pitting edema to bilateral hips, mild edema of lower pannus. Hepatojugular reflux to the level of the mandible present. He thinks his dry weight is 265 lbs. He denies drinking alcohol or a history of cirrhosis. He does note that in the past two weeks his home Lasix  has not been pulling fluid off as it normally does and he has been urinating less than usual. He denies orthopnea but notes SOB is while ambulating. Of note, he has been satting well on room air including > 95% while ambulating. Consider Jardiance if appropriate. HF exacerbation most likely due to A-Flutter with RVR, he will require IV diuresis and close monitoring.  - f/u Echo  - urine output 500 mL, s/p IV Lasix  40 mg once - IV Lasix  80 mg BID - Daily BMP - Keep K > 4; replenished K 40 mEq once  - Keep Mg > 2 - strict I/Os - Daily weights - f/u Resp panel  - f/u hepatic function panel   #HTN Takes Metoprolol  50 mg BID and Spironolactone  25 mg daily. He has had low-normal BP in the ED. Since he is on Lasix , will plan to hold these for now. Recommend resuming these when clinically appropriate.  - Hold home Metoprolol  50 mg BID - Hold home Spironolactone  25 mg daily   #Elevated Cr Cr on admission of 1.33. His baseline appears to be around 1.0 - 1.2.  - f/u BMP   #OSA History of OSA. Unsure if he uses CPAP at night.  - Confirm CPAP use    Best practice: Diet: Heart Healthy VTE: DOAC, Eliquis  5 mg BID  Code: Full  Disposition planning: Prior to Admission Living Arrangement:  Home Anticipated Discharge Location: Home  Dispo: Admit patient to Inpatient with expected  length of stay greater than 2 midnights.  Signed: Doyal Miyamoto, MD Internal Medicine Resident  08/18/2024, 6:37 AM  On Call pager: [phone]

## 2024-08-19 DIAGNOSIS — N289 Disorder of kidney and ureter, unspecified: Secondary | ICD-10-CM

## 2024-08-19 DIAGNOSIS — I483 Typical atrial flutter: Secondary | ICD-10-CM

## 2024-08-19 DIAGNOSIS — E876 Hypokalemia: Secondary | ICD-10-CM

## 2024-08-19 DIAGNOSIS — N179 Acute kidney failure, unspecified: Secondary | ICD-10-CM

## 2024-08-19 LAB — COMPREHENSIVE METABOLIC PANEL WITH GFR
ALT: 20 U/L (ref 0–44)
AST: 28 U/L (ref 15–41)
Albumin: 3.2 g/dL — ABNORMAL LOW (ref 3.5–5.0)
Alkaline Phosphatase: 47 U/L (ref 38–126)
Anion gap: 13 (ref 5–15)
BUN: 22 mg/dL — ABNORMAL HIGH (ref 6–20)
CO2: 24 mmol/L (ref 22–32)
Calcium: 8.3 mg/dL — ABNORMAL LOW (ref 8.9–10.3)
Chloride: 96 mmol/L — ABNORMAL LOW (ref 98–111)
Creatinine, Ser: 1.46 mg/dL — ABNORMAL HIGH (ref 0.61–1.24)
GFR, Estimated: 56 mL/min — ABNORMAL LOW (ref >60)
Glucose, Bld: 105 mg/dL — ABNORMAL HIGH (ref 70–99)
Potassium: 3.4 mmol/L — ABNORMAL LOW (ref 3.5–5.1)
Sodium: 133 mmol/L — ABNORMAL LOW (ref 135–145)
Total Bilirubin: 1.4 mg/dL — ABNORMAL HIGH (ref 0.0–1.2)
Total Protein: 6.6 g/dL (ref 6.5–8.1)

## 2024-08-19 LAB — CBC
HCT: 39.4 % (ref 39.0–52.0)
Hemoglobin: 12.6 g/dL — ABNORMAL LOW (ref 13.0–17.0)
MCH: 27.5 pg (ref 26.0–34.0)
MCHC: 32 g/dL (ref 30.0–36.0)
MCV: 85.8 fL (ref 80.0–100.0)
Platelets: 244 K/uL (ref 150–400)
RBC: 4.59 MIL/uL (ref 4.22–5.81)
RDW: 15 % (ref 11.5–15.5)
WBC: 9.2 K/uL (ref 4.0–10.5)
nRBC: 0 % (ref 0.0–0.2)

## 2024-08-19 LAB — MAGNESIUM: Magnesium: 2 mg/dL (ref 1.7–2.4)

## 2024-08-19 MED ORDER — POTASSIUM CHLORIDE CRYS ER 20 MEQ PO TBCR: 40.0000 meq | EXTENDED_RELEASE_TABLET | Freq: Two times a day (BID) | ORAL | Status: DC

## 2024-08-19 MED ORDER — METOPROLOL TARTRATE 25 MG PO TABS
25.0000 mg | ORAL_TABLET | Freq: Two times a day (BID) | ORAL | Status: DC
  Administered 2024-08-19 (×2): 25 mg via ORAL
  Filled 2024-08-19 (×2): qty 1

## 2024-08-19 MED ORDER — FUROSEMIDE 10 MG/ML IJ SOLN
100.0000 mg | Freq: Three times a day (TID) | INTRAVENOUS | Status: DC
  Administered 2024-08-19 – 2024-08-22 (×10): 100 mg via INTRAVENOUS
  Filled 2024-08-19 (×8): qty 10
  Filled 2024-08-19 (×2): qty 8
  Filled 2024-08-19 (×2): qty 10

## 2024-08-19 MED ORDER — POTASSIUM CHLORIDE CRYS ER 20 MEQ PO TBCR
40.0000 meq | EXTENDED_RELEASE_TABLET | Freq: Two times a day (BID) | ORAL | Status: AC
  Administered 2024-08-19 (×2): 40 meq via ORAL
  Filled 2024-08-19 (×2): qty 2

## 2024-08-19 MED ORDER — LOSARTAN POTASSIUM 25 MG PO TABS
12.5000 mg | ORAL_TABLET | Freq: Every day | ORAL | Status: DC
  Administered 2024-08-19 – 2024-08-20 (×2): 12.5 mg via ORAL
  Filled 2024-08-19 (×2): qty 1

## 2024-08-19 NOTE — Progress Notes (Signed)
 Mobility Specialist Progress Note;   08/19/24 1029  Mobility  Activity Ambulated independently  Level of Assistance Independent after set-up  Assistive Device None  Distance Ambulated (ft) 200 ft  Activity Response Tolerated well  Mobility Referral Yes  Mobility visit 1 Mobility  Mobility Specialist Start Time (ACUTE ONLY) 1029  Mobility Specialist Stop Time (ACUTE ONLY) 1034  Mobility Specialist Time Calculation (min) (ACUTE ONLY) 5 min   Pt in chair upon arrival, agreeable to mobility. Required no physical during ambulation. HR maintained in upper 80s-lower 90s throughout ambulation w/ no c/o. Pt returned to chair and left with all needs met.   Lauraine Erm Mobility Specialist Please contact via SecureChat or [employer] [phone]

## 2024-08-19 NOTE — TOC Initial Note (Signed)
 Transition of Care [facility]) - Initial/Assessment Note    Patient Details  Name: Anthony Byrd MRN: [id number] Date of Birth: 07/12/1971  Transition of [facility] CM/SW Contact:    Sudie Erminio Deems, RN Phone Number: 08/19/2024, 3:50 PM  Clinical Narrative:  Patient presented for shortness of breath. Patient states he was from home prior to arrival to hospital. Patient states he has no insurance; however has PCP with the Internal Medicine Clinic. Patient states he may have Medicaid; however, is unsure. Inpatient Case Manager contacted the [facility] to see if the patient is eligible for Medicaid. Inpatient Case Manager will continue to follow for additional disposition needs.            Expected Discharge Plan: Home/Self Care Barriers to Discharge: Continued Medical Work up   Patient Goals and CMS Choice Patient states their goals for this hospitalization and ongoing recovery are:: plan to return home   Expected Discharge Plan and Services In-house Referral: Financial Counselor Discharge Planning Services: CM Consult Post Acute Care Choice: NA Living arrangements for the past 2 months: Single Family Home                   DME Agency: NA  Prior Living Arrangements/Services Living arrangements for the past 2 months: Single Family Home Lives with:: Self Patient language and need for interpreter reviewed:: Yes Do you feel safe going back to the place where you live?: Yes      Need for Family Participation in Patient Care: No (Comment) Care giver support system in place?: No (comment)   Criminal Activity/Legal Involvement Pertinent to Current Situation/Hospitalization: No - Comment as needed  Activities of Daily Living   ADL Screening (condition at time of admission) Independently performs ADLs?: Yes (appropriate for developmental age) Is the patient deaf or have difficulty hearing?: No Does the patient have difficulty seeing, even when wearing glasses/contacts?: No Does the  patient have difficulty concentrating, remembering, or making decisions?: No  Permission Sought/Granted Permission sought to share information with : Case Manager, Family Supports   Emotional Assessment Appearance:: Appears stated age Attitude/Demeanor/Rapport: Engaged Affect (typically observed): Appropriate Orientation: : Oriented to Self, Oriented to Place, Oriented to  Time Alcohol / Substance Use: Not Applicable Psych Involvement: No (comment)  Admission diagnosis:  Acute on chronic diastolic heart failure (HCC) [I50.33] Hypokalemia [E87.6] Renal insufficiency [N28.9] Elevated troponin [R79.89] Atrial flutter with rapid ventricular response (HCC) [I48.92] Typical atrial flutter (HCC) [I48.3] Patient Active Problem List   Diagnosis Date Noted   Atrial flutter with rapid ventricular response (HCC) 08/18/2024   Elevated serum creatinine 08/18/2024   Acute pulmonary edema (HCC) 08/18/2024   Separated shoulder, left, subsequent encounter 12/18/2023   Elevated blood pressure reading 12/18/2023   Chronic venous insufficiency of lower extremity 07/12/2022   Lump in chest 07/12/2022   Left hamstring muscle strain 04/27/2022   CKD (chronic kidney disease), stage II 12/28/2021   Atrial flutter (HCC) 12/23/2021   Typical atrial flutter (HCC)    Nonrheumatic mitral valve regurgitation    Acute diastolic CHF (congestive heart failure), NYHA class 4 (HCC)    Severe mitral regurgitation s/p MVR    Atrial fibrillation with RVR (HCC) 12/03/2021   DOE (dyspnea on exertion) 12/02/2021   Left knee pain 12/02/2021   AKI (acute kidney injury) 12/01/2021   Lymphadenopathy of left cervical region 09/30/2021   CHF (congestive heart failure) (HCC) 01/28/2021   Healthcare maintenance 01/28/2021   Right shoulder pain 01/28/2021   Left ankle pain 01/28/2021  Tremor 07/15/2020   Acute on chronic diastolic heart failure (HCC)    Cough 06/28/2020   Polycythemia    Hypokalemia    Hematuria  12/03/2018   OSA (obstructive sleep apnea) 11/25/2018   Atrial fibrillation (HCC) 11/23/2018   PCP:  Elicia Sharper, DO Pharmacy:   [facility] [id number] - [place], [place] - [address] [place] [address] Phone: [phone] Fax: [phone]     Social Drivers of Health (SDOH) Social History: SDOH Screenings   Food Insecurity: No Food Insecurity (08/18/2024)  Housing: Low Risk  (08/18/2024)  Transportation Needs: No Transportation Needs (08/18/2024)  Utilities: Not At Risk (08/18/2024)  Depression (PHQ2-9): Low Risk  (12/23/2021)  Financial Resource Strain: Medium Risk (04/27/2022)  Tobacco Use: Low Risk  (08/18/2024)   SDOH Interventions:     Readmission Risk Interventions     No data to display

## 2024-08-19 NOTE — Evaluation (Signed)
 Occupational Therapy Evaluation and Discharge Patient Details Name: Anthony Byrd MRN: [id number] DOB: 19-May-1971 Today's Date: 08/19/2024   History of Present Illness   Pt is a 53 y/o M presenting on 08/17/24 with c/o progressive LE swelling & SOB x 2 weeks. Pt is being treated for a-flutter with RVR, a-fib, acute on chronic HF exacerbation. PMH: a-flutter, a-fib s/p MAZE procedure, severe mitral regurgitation s/p MVRepair, HFpEF, OSA     Clinical Impressions Pt is at baseline Ind level with ADLs and ADL mobility. 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 Pt reports that he feels good and demos no SOB, LOB or c/o DOE during in room activity with HR 80s-low 90s throughout. All education completed and no further acute or follow up OT services indicated at this time. OT will sign off     If plan is discharge home, recommend the following:   Assistance with cooking/housework;Assist for transportation     Functional Status Assessment   Patient has not had a recent decline in their functional status     Equipment Recommendations   None recommended by OT     Recommendations for Other Services         Precautions/Restrictions   Restrictions Weight Bearing Restrictions Per Provider Order: No     Mobility Bed Mobility               General bed mobility comments: pt in chair    Transfers Overall transfer level: Modified independent Equipment used: None                      Balance Overall balance assessment: Mild deficits observed, not formally tested                                         ADL either performed or assessed with clinical judgement   ADL Overall ADL's : At baseline;Independent;Modified independent                                       General ADL Comments: no LOB during ADL mobility/transfers to toilet and shower     Vision Baseline  Vision/History: 1 Wears glasses Ability to See in Adequate Light: 0 Adequate Patient Visual Report: No change from baseline       Perception         Praxis         Pertinent Vitals/Pain Pain Assessment Pain Assessment: No/denies pain     Extremity/Trunk Assessment Upper Extremity Assessment Upper Extremity Assessment: Defer to OT evaluation   Lower Extremity Assessment Lower Extremity Assessment: Defer to PT evaluation       Communication Communication Communication: No apparent difficulties   Cognition Arousal: Alert Behavior During Therapy: WFL for tasks assessed/performed, Flat affect Cognition: No apparent impairments                               Following commands: Intact       Cueing  General Comments   Cueing Techniques: Verbal cues      Exercises     Shoulder Instructions      Home Living Family/patient expects to be discharged to:: Private residence  Living Arrangements: Other relatives Available Help at Discharge: Family Type of Home: House Home Access: Stairs to enter [employer] of Steps: 4 Entrance Stairs-Rails: Right;Left;Can reach both Home Layout: One level     Bathroom Shower/Tub: Chief Strategy Officer: Standard     Home Equipment: None          Prior Functioning/Environment Prior Level of Function : Working/employed;Driving;Independent/Modified Independent             Mobility Comments: Independent without AD, working as a Gaffer, 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 ADLs Comments: Ind with ADLs, IADLs    OT Problem List: Decreased activity tolerance   OT Treatment/Interventions:        OT Goals(Current goals can be found in the care plan section)   Acute Rehab OT Goals Patient Stated Goal: go home OT Goal Formulation: All assessment and education complete, DC therapy   OT Frequency:       Co-evaluation              AM-PAC OT 6 Clicks Daily Activity     Outcome Measure Help  from another person eating meals?: None Help from another person taking care of personal grooming?: None Help from another person toileting, which includes using toliet, bedpan, or urinal?: None Help from another person bathing (including washing, rinsing, drying)?: None Help from another person to put on and taking off regular upper body clothing?: None Help from another person to put on and taking off regular lower body clothing?: None 6 Click Score: 24   End of Session Nurse Communication: Mobility status  Activity Tolerance: Patient tolerated treatment well Patient left: in chair  OT Visit Diagnosis: Other abnormalities of gait and mobility (R26.89);Muscle weakness (generalized) (M62.81)                Time: 8840-8781 OT Time Calculation (min): 19 min Charges:  OT General Charges $OT Visit: 1 Visit OT Evaluation $OT Eval Moderate Complexity: 1 Mod    Jacques Karna Loose 08/19/2024, 1:24 PM

## 2024-08-19 NOTE — Progress Notes (Signed)
 HD#1 SUBJECTIVE:  Patient Summary: Anthony Byrd is a 53 y.o. with a pertinent PMH of atrial flutter, A-fib s/p maze procedure 12/2021, severe mitral regurgitation s/p MV repair 11/2021, HFpEF (05/2022 echo: EF 70 to 75%), OSA, who presented with progressive leg swelling and SOB for the past 2 weeks and admitted for A-flutter with RVR and acute on chronic heart failure exacerbation.   Overnight Events: no overnight events  Interim History: seen at bedside. No SOB, CP, palpitations, abdominal pain, difficulty/pain with urination. Stated feels overall swelling has gone down, usually carries fluid in abdomen. Said when getting weighed on the floor he has been getting on scale.   OBJECTIVE:  Vital Signs: Vitals:   08/18/24 2152 08/18/24 2300 08/19/24 0358 08/19/24 0404  BP:  112/84    Pulse:  83    Resp:  18  20  Temp: 98.5 F (36.9 C) 99.6 F (37.6 C)  98.9 F (37.2 C)  TempSrc: Oral Axillary  Oral  SpO2: 94% 95%  95%  Weight:   130.9 kg   Height:       Supplemental O2: Room Air SpO2: 95 %  Filed Weights   08/17/24 2248 08/18/24 1839 08/19/24 0358  Weight: 126.8 kg 131.1 kg 130.9 kg     Intake/Output Summary (Last 24 hours) at 08/19/2024 0744 Last data filed at 08/18/2024 2302 Gross per 24 hour  Intake 960 ml  Output 2400 ml  Net -1440 ml   Net IO Since Admission: -1,960 mL [08/19/24 0744]  Physical Exam: Physical Exam Constitutional:      Appearance: He is obese.  Cardiovascular:     Rate and Rhythm: Normal rate and regular rhythm.     Heart sounds: Murmur (2/6 systolic left 2nd ICS) heard.     No friction rub. No gallop.  Pulmonary:     Effort: Pulmonary effort is normal. No accessory muscle usage or respiratory distress.     Breath sounds: No decreased breath sounds, wheezing, rhonchi or rales.  Abdominal:     General: Bowel sounds are normal.     Palpations: Abdomen is soft.     Tenderness: There is no abdominal tenderness. There is no guarding.   Musculoskeletal:     Right lower leg: Edema (2+) present.     Left lower leg: Edema (2+) present.  Skin:    General: Skin is warm and dry.  Neurological:     Mental Status: He is alert.     Patient Lines/Drains/Airways Status     Active Line/Drains/Airways     Name Placement date Placement time Site Days   Peripheral IV 08/18/24 20 G Anterior;Left Forearm 08/18/24  0235  Forearm  less than 1            Pertinent labs and imaging:      Latest Ref Rng & Units 08/19/2024    5:11 AM 08/17/2024   10:52 PM 02/16/2022   11:36 AM  CBC  WBC 4.0 - 10.5 K/uL 9.2  9.1  9.4   Hemoglobin 13.0 - 17.0 g/dL 87.3  86.6  85.6   Hematocrit 39.0 - 52.0 % 39.4  42.1  45.4   Platelets 150 - 400 K/uL 244  255  330        Latest Ref Rng & Units 08/19/2024    5:11 AM 08/18/2024    1:04 PM 08/18/2024   12:51 AM  CMP  Glucose 70 - 99 mg/dL 894  831    BUN 6 - 20  mg/dL 22  18    Creatinine 9.38 - 1.24 mg/dL 8.53  8.64    Sodium 864 - 145 mmol/L 133  134    Potassium 3.5 - 5.1 mmol/L 3.4  3.8    Chloride 98 - 111 mmol/L 96  100    CO2 22 - 32 mmol/L 24  22    Calcium 8.9 - 10.3 mg/dL 8.3  8.1    Total Protein 6.5 - 8.1 g/dL 6.6   6.9   Total Bilirubin 0.0 - 1.2 mg/dL 1.4   1.0   Alkaline Phos 38 - 126 U/L 47   51   AST 15 - 41 U/L 28   33   ALT 0 - 44 U/L 20   24    Mg pending Respiratory panel pending   ECHOCARDIOGRAM COMPLETE Result Date: 08/18/2024    ECHOCARDIOGRAM REPORT   Patient Name:   Anthony Byrd Date of Exam: 08/18/2024 Medical Rec #:  [id number]      Height:       68.0 in Accession #:    [id number]     Weight:       279.5 lb Date of Birth:  11-Jun-1971      BSA:          2.356 m Patient Age:    53 years       BP:           112/71 mmHg Patient Gender: M              HR:           112 bpm. Exam Location:  Inpatient Procedure: 2D Echo and Intracardiac Opacification Agent (Both Spectral and Color            Flow Doppler were utilized during procedure). Indications:    Atrial  Flutter  History:        Patient has prior history of Echocardiogram examinations. CHF.                  Mitral Valve: prosthetic annuloplasty ring valve is present in                 the mitral position. Procedure Date: 2023.  Sonographer:    Charmaine Gaskins Referring Phys: [id number] Anthony Byrd  Sonographer Comments: Technically difficult study due to poor echo windows. IMPRESSIONS  1. Respirophasic septal motion. Can be seen in PH/RV failure, obesity. Left ventricular ejection fraction, by estimation, is 45 to 50%. The left ventricle has mildly decreased function. The left ventricle demonstrates global hypokinesis. Left ventricular diastolic function could not be evaluated. Elevated left atrial pressure.  2. Right ventricular systolic function is moderately reduced. The right ventricular size is normal. There is mildly elevated pulmonary artery systolic pressure. The estimated right ventricular systolic pressure is 39.2 mmHg.  3. Left atrial size was moderately dilated.  4. No significant MS, mitral gradient driven primarily by heart rate. The mitral valve has been repaired/replaced. Mild mitral valve regurgitation. No evidence of mitral stenosis. The mean mitral valve gradient is 7.0 mmHg. There is a prosthetic annuloplasty ring present in the mitral position. Procedure Date: 2023.  5. The tricuspid valve is degenerative. Tricuspid valve regurgitation is mild to moderate.  6. The aortic valve has an indeterminant number of cusps. There is moderate calcification of the aortic valve. There is moderate thickening of the aortic valve. Aortic valve regurgitation is moderate to severe. Mild to moderate aortic valve stenosis. Aortic valve area, by  VTI measures 1.32 cm. Aortic valve mean gradient measures 18.0 mmHg. Aortic valve Vmax measures 2.67 m/s.  7. The inferior vena cava is dilated in size with <50% respiratory variability, suggesting right atrial pressure of 15 mmHg. FINDINGS  Left Ventricle: Respirophasic  septal motion. Can be seen in PH/RV failure, obesity. Left ventricular ejection fraction, by estimation, is 45 to 50%. The left ventricle has mildly decreased function. The left ventricle demonstrates global hypokinesis. Definity  contrast agent was given IV to delineate the left ventricular endocardial borders. The left ventricular internal cavity size was normal in size. There is no left ventricular hypertrophy. Left ventricular diastolic function could not be evaluated  due to mitral annular calcification (moderate or greater). Left ventricular diastolic function could not be evaluated. Elevated left atrial pressure. Right Ventricle: The right ventricular size is normal. No increase in right ventricular wall thickness. Right ventricular systolic function is moderately reduced. There is mildly elevated pulmonary artery systolic pressure. The tricuspid regurgitant velocity is 2.46 m/s, and with an assumed right atrial pressure of 15 mmHg, the estimated right ventricular systolic pressure is 39.2 mmHg. Left Atrium: Left atrial size was moderately dilated. Right Atrium: Right atrial size was normal in size. Pericardium: There is no evidence of pericardial effusion. Mitral Valve: No significant MS, mitral gradient driven primarily by heart rate. The mitral valve has been repaired/replaced. Moderately decreased mobility of the mitral valve leaflets. Mild mitral valve regurgitation. There is a prosthetic annuloplasty ring present in the mitral position. Procedure Date: 2023. No evidence of mitral valve stenosis. MV peak gradient, 17.7 mmHg. The mean mitral valve gradient is 7.0 mmHg. Tricuspid Valve: The tricuspid valve is degenerative in appearance. Tricuspid valve regurgitation is mild to moderate. No evidence of tricuspid stenosis. Aortic Valve: The aortic valve has an indeterminant number of cusps. There is moderate calcification of the aortic valve. There is moderate thickening of the aortic valve. Aortic valve  regurgitation is moderate to severe. Mild to moderate aortic stenosis  is present. Aortic valve mean gradient measures 18.0 mmHg. Aortic valve peak gradient measures 28.5 mmHg. Aortic valve area, by VTI measures 1.32 cm. Pulmonic Valve: The pulmonic valve was normal in structure. Pulmonic valve regurgitation is not visualized. No evidence of pulmonic stenosis. Aorta: The aortic root is normal in size and structure. Venous: The inferior vena cava is dilated in size with less than 50% respiratory variability, suggesting right atrial pressure of 15 mmHg. IAS/Shunts: No atrial level shunt detected by color flow Doppler.  LEFT VENTRICLE PLAX 2D LVIDd:         5.00 cm LVIDs:         3.10 cm LV PW:         1.00 cm LV IVS:        0.90 cm LVOT diam:     2.20 cm LV SV:         65 LV SV Index:   28 LVOT Area:     3.80 cm  LV Volumes (MOD) LV vol d, MOD A2C: 107.0 ml LV vol d, MOD A4C: 116.0 ml LV vol s, MOD A2C: 69.2 ml LV vol s, MOD A4C: 65.9 ml LV SV MOD A2C:     37.8 ml LV SV MOD A4C:     116.0 ml LV SV MOD BP:      47.4 ml RIGHT VENTRICLE RV Basal diam:  3.60 cm RV Mid diam:    3.40 cm RV S prime:     6.96 cm/s TAPSE (M-mode): 1.1  cm LEFT ATRIUM              Index        RIGHT ATRIUM           Index LA diam:        4.60 cm  1.95 cm/m   RA Area:     21.20 cm LA Vol (A2C):   92.6 ml  39.30 ml/m  RA Volume:   61.40 ml  26.06 ml/m LA Vol (A4C):   103.0 ml 43.71 ml/m LA Biplane Vol: 99.8 ml  42.35 ml/m  AORTIC VALVE AV Area (Vmax):    1.30 cm AV Area (Vmean):   1.29 cm AV Area (VTI):     1.32 cm AV Vmax:           267.00 cm/s AV Vmean:          202.667 cm/s AV VTI:            0.494 m AV Peak Grad:      28.5 mmHg AV Mean Grad:      18.0 mmHg LVOT Vmax:         91.23 cm/s LVOT Vmean:        69.000 cm/s LVOT VTI:          0.171 m LVOT/AV VTI ratio: 0.35  AORTA Ao Root diam: 3.30 cm Ao Asc diam:  3.25 cm MITRAL VALVE               TRICUSPID VALVE MV Area (PHT): 5.00 cm    TR Peak grad:   24.2 mmHg MV Area VTI:   1.82  cm    TR Vmax:        246.00 cm/s MV Peak grad:  17.7 mmHg MV Mean grad:  7.0 mmHg    SHUNTS MV Vmax:       2.10 m/s    Systemic VTI:  0.17 m MV Vmean:      119.3 cm/s  Systemic Diam: 2.20 cm Morene Brownie Electronically signed by Morene Brownie Signature Date/Time: 08/18/2024/4:45:55 PM    Final     ASSESSMENT/PLAN:  Assessment: Principal Problem:   Atrial flutter with rapid ventricular response (HCC) Active Problems:   OSA (obstructive sleep apnea)   Acute on chronic diastolic heart failure (HCC)   DOE (dyspnea on exertion)   Severe mitral regurgitation s/p MVR   Elevated serum creatinine   Acute pulmonary edema (HCC)   Plan: A-flutter with RVR (cardioversion in 2023) A-fib (MAZE procedure 2023) MV repair (s/p 11/2021) In sinus rhythm  -Continue metoprolol  to tartrate 25 mg 2 times daily  -Continue Eliquis  5 mg twice daily  Acute on chronic HF exacerbation HFrEF (08/19/2024 EF 45-50%, decreased from 2023 EF of 70-75%) Hypokalemia Wet and Warm on phsyical exam.  Likely 2/2 acute on chronic HF exacerbation 2/2 afib/aflutter  Echo:  EF 40-50%. (Moderately reduced) mildly decreased LV function.  LV demonstrates global hypokinesis.  Elevated LA pressure.  Moderately reduced RV function.  Mildly elevated pulmonary artery systolic pressure.  LA moderately dilated.  Mild MR.  TR mild to moderate.  Moderate calcification of aortic valve.  Mild to moderate AS.  Moderate to severe AR.  Dilated IVC Dry weight 265-270 pounds.  Weight today 288 pounds. Out 1.4 L yesterday. Patient stated weights on floor have been taken standing.  -Cardiology consulted and recommending  -Furosemide  100 mg 3 times daily -TEE this admission to evaluate valves, once euvolemic. -Initiate GDMT for HFpEF prior to discharge (home  regimen includes spironolactone , currently on short acting metoprolol  which is not GDMT) -F/u BMP -K goal >4, K3.4, KCl 40 mEq twice daily ordered -Mg goal > 2, Mg 2.0  HTN Blood  pressure 120/76.  Home medications include metoprolol  50 mg twice daily and spironolactone  25 mg daily.  - will assess BP later this afternoon and adjust the medication below if SBP <90 or DBP <60 or MAP <65 - Continue metoprolol  25 mg 2 times daily per cardiology  - losartan 12.5 mg daily per cardiology  - Diuresis with Lasix  100 mg TID per cardiology  - metoprolol  tartrate 25 mg BID per cardiology  - Continue holding spironolactone  25 mg daily  AKI Creatinine 1.46.  Baseline appears to be 1.0.  Likely in setting of diuresis. Requires further diuresis at this time, still wet and warm. Improved SOB but with 2+ pitting LE edema.  - F/u BMP  Decreased hemoglobin Hemoglobin 12.6. - Continue to trend  Prediabetic A1c 6.0 12/02/2021.  Glucose less than 100 since admission.  A1c 5.6  OSA Adherent to CPAP at home. Did not get CPAP last night.  - CPAP ordered for inpatient stay  Best Practice: Diet: Cardiac diet IVF: Fluids: none, Rate: None VTE: Apixaban  5 mg twice daily Code: Full  Disposition planning: Therapy Recs: Home, DME: none DISPO: Anticipated discharge pending cardiology recommendations.  Signature:  Sallyanne Benuel [facility]  7:44 AM, 08/19/2024  On Call pager [phone]

## 2024-08-19 NOTE — Progress Notes (Addendum)
  Progress Note  Patient Name: Anthony Byrd Date of Encounter: 08/19/2024 [facility] Cardiologist: Anthony ONEIDA Decent, MD   Interval Summary   No events overnight. Patient continues to reports dyspnea and LE edema and feels he has not urinated much. He denies any chest palpitations.   Vital Signs Vitals:   08/18/24 2152 08/18/24 2300 08/19/24 0358 08/19/24 0404  BP:  112/84    Pulse:  83    Resp:  18  20  Temp: 98.5 F (36.9 C) 99.6 F (37.6 C)  98.9 F (37.2 C)  TempSrc: Oral Axillary  Oral  SpO2: 94% 95%  95%  Weight:   130.9 kg   Height:        Intake/Output Summary (Last 24 hours) at 08/19/2024 0756 Last data filed at 08/18/2024 2302 Gross per 24 hour  Intake 960 ml  Output 2400 ml  Net -1440 ml      08/19/2024    3:58 AM 08/18/2024    6:39 PM 08/17/2024   10:48 PM  Last 3 Weights  Weight (lbs) 288 lb 8 oz 289 lb 279 lb 8.7 oz  Weight (kg) 130.863 kg 131.09 kg 126.8 kg      Telemetry/ECG  NSR - Personally Reviewed  Physical Exam  GEN: No acute distress.   Neck: No JVD Cardiac: RRR w/ diastolic murmur II/VI loudest in RUSB, no murmurs, rubs, or gallops.  Respiratory: Clear to auscultation bilaterally. GI: Soft, nontender, non-distended  MS: 2+ LE edema bilaterally  Assessment & Plan  Anthony Byrd is a 53 y.o. year-old male with history of severe MR s/p annuloplasty 11/2021, persistent Afib s/p MAZE 11/2021, atypical aflutter s/p DCCV 12/2021, HFpEF, and OSA presenting with volume overload and AFL w/ RVR 119 .  #HFmrEF EF 45-50% (down from 70-75%) #Mod-severe AI #Severe MR s/p annuloplasty 11/2021 #Persistent Afib s/p MAZE 11/2021 #Atypical aflutter s/p DCCV 12/2021 - Continues to report symptoms and does not seem like he has adequate response to the lasix . Tele with NSR. - Increase IV lasix  to 100 TID (change made) - Lower metop to 25 BID given ADHF (change made) and need for BP room for GDMT - Start losartan 12.5 mg daily for GDMT - Will initiate  SGLT2i prior to discharge - Will need TEE to evaluate AI when euvolemic    For questions or updates, please contact [facility] Please consult [email or web address] for contact info under   Signed, Anthony VEAR Ren Donley, MD

## 2024-08-19 NOTE — Plan of Care (Signed)

## 2024-08-19 NOTE — Progress Notes (Signed)
   08/19/24 2207  BiPAP/CPAP/SIPAP  BiPAP/CPAP/SIPAP Pt Type Adult  Reason BIPAP/CPAP not in use (S)  Non-compliant

## 2024-08-20 ENCOUNTER — Telehealth: Payer: Self-pay | Admitting: Pharmacy Technician

## 2024-08-20 ENCOUNTER — Other Ambulatory Visit (HOSPITAL_COMMUNITY): Payer: Self-pay

## 2024-08-20 ENCOUNTER — Encounter (HOSPITAL_COMMUNITY): Payer: Self-pay | Admitting: Internal Medicine

## 2024-08-20 DIAGNOSIS — N289 Disorder of kidney and ureter, unspecified: Secondary | ICD-10-CM

## 2024-08-20 LAB — BASIC METABOLIC PANEL WITH GFR
Anion gap: 12 (ref 5–15)
BUN: 25 mg/dL — ABNORMAL HIGH (ref 6–20)
CO2: 27 mmol/L (ref 22–32)
Calcium: 8.7 mg/dL — ABNORMAL LOW (ref 8.9–10.3)
Chloride: 96 mmol/L — ABNORMAL LOW (ref 98–111)
Creatinine, Ser: 1.39 mg/dL — ABNORMAL HIGH (ref 0.61–1.24)
GFR, Estimated: 60 mL/min — ABNORMAL LOW (ref >60)
Glucose, Bld: 95 mg/dL (ref 70–99)
Potassium: 3.3 mmol/L — ABNORMAL LOW (ref 3.5–5.1)
Sodium: 135 mmol/L (ref 135–145)

## 2024-08-20 LAB — CBC
HCT: 39.9 % (ref 39.0–52.0)
Hemoglobin: 12.8 g/dL — ABNORMAL LOW (ref 13.0–17.0)
MCH: 27.3 pg (ref 26.0–34.0)
MCHC: 32.1 g/dL (ref 30.0–36.0)
MCV: 85.1 fL (ref 80.0–100.0)
Platelets: 255 K/uL (ref 150–400)
RBC: 4.69 MIL/uL (ref 4.22–5.81)
RDW: 14.8 % (ref 11.5–15.5)
WBC: 8.4 K/uL (ref 4.0–10.5)
nRBC: 0 % (ref 0.0–0.2)

## 2024-08-20 LAB — MAGNESIUM: Magnesium: 1.8 mg/dL (ref 1.7–2.4)

## 2024-08-20 MED ORDER — POTASSIUM CHLORIDE CRYS ER 20 MEQ PO TBCR
40.0000 meq | EXTENDED_RELEASE_TABLET | Freq: Once | ORAL | Status: AC
  Administered 2024-08-20: 40 meq via ORAL
  Filled 2024-08-20: qty 2

## 2024-08-20 MED ORDER — METOPROLOL SUCCINATE ER 50 MG PO TB24
50.0000 mg | ORAL_TABLET | Freq: Every day | ORAL | Status: DC
  Administered 2024-08-20: 50 mg via ORAL
  Filled 2024-08-20: qty 1

## 2024-08-20 MED ORDER — MAGNESIUM SULFATE 2 GM/50ML IV SOLN
2.0000 g | Freq: Once | INTRAVENOUS | Status: AC
  Administered 2024-08-20: 2 g via INTRAVENOUS
  Filled 2024-08-20: qty 50

## 2024-08-20 MED ORDER — EMPAGLIFLOZIN 10 MG PO TABS
10.0000 mg | ORAL_TABLET | Freq: Every day | ORAL | Status: DC
  Administered 2024-08-20 – 2024-08-22 (×3): 10 mg via ORAL
  Filled 2024-08-20 (×3): qty 1

## 2024-08-20 NOTE — Progress Notes (Signed)
 Pharmacist and pharmacy students rounding with the IMT[facility] service.   1) Explained the indication for use: to decrease the likelihood of stroke in the setting of non-valvular atrial fibrillation.   2) Reviewed dose regimen, recommending to take the Eliquis  at the same time TWICE daily (morning and evening) with or without food.   3) Reviewed drug-diet instructions, including limiting alcohol consumption.   4) Recommended to take a missed dose as soon as remembered on the same day and resume taking twice daily - advised not to take 2 doses of Eliquis  at the same time.   5) Discussed the importance of adherence: Stopping without other stroke or VTE prevention medication to take the place of Eliquis  may increase your risk of developing a new clot or stroke. Refill your prescription before you run out.   6) Explained how to monitor for bleeding. Notify a HCP for unusual bruising, urine or stool color changes like red, dark brown, or black, bleeding from injury, or a nosebleed that does not stop, as well as if you have a serious fall or if you hit your head, even if there is no bleeding.   7) Future dose adjustments by your HCP may be needed if kidney function or your weight changes, or you add new medications.  Lynwood Lites, PhamD, CPP Clinical Pharmacist Practitioner

## 2024-08-20 NOTE — Progress Notes (Signed)
 Physical Therapy Treatment Patient Details Name: Anthony Byrd MRN: [id number] DOB: 1971-03-16 Today's Date: 08/20/2024   History of Present Illness Pt is a 53 y/o M presenting on 08/17/24 with c/o progressive LE swelling & SOB x 2 weeks. Pt is being treated for a-flutter with RVR, a-fib, acute on chronic HF exacerbation. PMH: a-flutter, a-fib s/p MAZE procedure, severe mitral regurgitation s/p MVRepair, HFpEF, OSA    PT Comments  The pt was agreeable to session, reports ambulating independently in hallway 3-4x/day. The pt was able to demo good stability with all tranfers, HR stable with all mobility. The pt also completed a flight of stairs with use of single rail and supervision with VSS, demos good gait speed, good stability, and was educated on progressive walking program. Recommendations remain appropriate.    If plan is discharge home, recommend the following: Assistance with cooking/housework;Assist for transportation   Can travel by private vehicle        Equipment Recommendations  None recommended by PT    Recommendations for Other Services       Precautions / Restrictions Precautions Precautions: Fall Recall of Precautions/Restrictions: Intact Restrictions Weight Bearing Restrictions Per Provider Order: No     Mobility  Bed Mobility Overal bed mobility: Independent             General bed mobility comments: pt OOB in chair at start and end of session    Transfers Overall transfer level: Independent Equipment used: None               General transfer comment: no increased time or assist    Ambulation/Gait Ambulation/Gait assistance: Supervision Gait Distance (Feet): 250 Feet Assistive device: None Gait Pattern/deviations: WFL(Within Functional Limits) Gait velocity: 1.0 m/s Gait velocity interpretation: >2.62 ft/sec, indicative of community ambulatory   General Gait Details: slighly slowed gait and increased sway but no LOB or buckling.  VSS   Stairs Stairs: Yes Stairs assistance: Supervision Stair Management: One rail Left, Alternating pattern, Forwards Number of Stairs: 12 General stair comments: HR to 104bpm       Balance Overall balance assessment: Independent                                          Communication Communication Communication: No apparent difficulties  Cognition Arousal: Alert Behavior During Therapy: WFL for tasks assessed/performed, Flat affect   PT - Cognitive impairments: No apparent impairments                         Following commands: Intact      Cueing Cueing Techniques: Verbal cues  Exercises Other Exercises Other Exercises: discussed progressive walking program    General Comments General comments (skin integrity, edema, etc.): VSS, HR to 104bpm      Pertinent Vitals/Pain Pain Assessment Pain Assessment: No/denies pain     PT Goals (current goals can now be found in the care plan section) Acute Rehab PT Goals Patient Stated Goal: get better PT Goal Formulation: With patient Time For Goal Achievement: 09/02/24 Potential to Achieve Goals: Good Progress towards PT goals: Progressing toward goals    Frequency    Min 2X/week       AM-PAC PT 6 Clicks Mobility   Outcome Measure  Help needed turning from your back to your side while in a flat bed without using bedrails?: None Help needed moving  from lying on your back to sitting on the side of a flat bed without using bedrails?: None Help needed moving to and from a bed to a chair (including a wheelchair)?: None Help needed standing up from a chair using your arms (e.g., wheelchair or bedside chair)?: None Help needed to walk in hospital room?: A Little Help needed climbing 3-5 steps with a railing? : A Little 6 Click Score: 22    End of Session Equipment Utilized During Treatment: Gait belt Activity Tolerance: Patient tolerated treatment well Patient left: in chair Nurse  Communication: Mobility status PT Visit Diagnosis: Muscle weakness (generalized) (M62.81);Other abnormalities of gait and mobility (R26.89)     Time: 8767-8756 PT Time Calculation (min) (ACUTE ONLY): 11 min  Charges:    $Therapeutic Exercise: 8-22 mins PT General Charges $$ ACUTE PT VISIT: 1 Visit                     Izetta Call, PT, DPT   Acute Rehabilitation Department Office [phone] Secure Chat Communication Preferred   Izetta JULIANNA Call 08/20/2024, 2:55 PM

## 2024-08-20 NOTE — Telephone Encounter (Signed)
 Received patient portion. He doesn't know his income. Waiting on provider portion then will send to bms. He does not have insurance

## 2024-08-20 NOTE — Progress Notes (Signed)
 HD#2 SUBJECTIVE:  Patient Summary: Anthony Byrd is a 53 y.o. with a pertinent PMH of atrial flutter, A-fib s/p maze procedure 12/2021, severe mitral regurgitation s/p MV repair 11/2021, HFpEF (05/2022: EF 70 to 75%, 07/2024 EF: 40-50%), OSA, who presented with progressive leg swelling and SOB for the past 2 weeks and admitted for A-flutter with RVR and acute on chronic heart failure exacerbation.   Echo:  EF 40-50%. (Moderately reduced) mildly decreased LV function.  LV demonstrates global hypokinesis.  Elevated LA pressure.  Moderately reduced RV function.  Mildly elevated pulmonary artery systolic pressure.  LA moderately dilated.  Mild MR.  TR mild to moderate.  Moderate calcification of aortic valve.  Mild to moderate AS.  Moderate to severe AR.  Dilated IVC  Overnight Events: refused CPAP due to different facemask than he has at home  Interim History: Patient seen bedside.  No chest pain, shortness of breath, abdominal pain, having bowel movements, no issues with urination.  Reports no shortness of breath with exertion and decreased swelling in legs.   OBJECTIVE:  Vital Signs: Vitals:   08/19/24 1958 08/19/24 2320 08/20/24 0444 08/20/24 0750  BP: 111/67 115/74 105/63 111/72  Pulse: 91 80 88 79  Resp: 19 16 16 16   Temp: 98 F (36.7 C) 98 F (36.7 C) 98 F (36.7 C) (!) 97.5 F (36.4 C)  TempSrc: Oral Oral Oral Oral  SpO2: 96% 96% 98% 95%  Weight:   127 kg   Height:       Supplemental O2: Room Air SpO2: 95 %  Filed Weights   08/19/24 0358 08/19/24 0900 08/20/24 0444  Weight: 130.9 kg 131.1 kg 127 kg     Intake/Output Summary (Last 24 hours) at 08/20/2024 1119 Last data filed at 08/20/2024 1100 Gross per 24 hour  Intake 1786.3 ml  Output 7640 ml  Net -5853.7 ml   Net IO Since Admission: -7,813.7 mL [08/20/24 1119]  Physical Exam: Physical Exam Constitutional:      Appearance: He is obese.  Cardiovascular:     Rate and Rhythm: Normal rate and regular rhythm.      Heart sounds: Murmur (2/6 systolic left 2nd ICS) heard.     No friction rub. No gallop.  Pulmonary:     Effort: Pulmonary effort is normal. No accessory muscle usage or respiratory distress.     Breath sounds: No decreased breath sounds, wheezing, rhonchi or rales.  Abdominal:     General: Bowel sounds are normal.     Palpations: Abdomen is soft.     Tenderness: There is no abdominal tenderness. There is no guarding.  Musculoskeletal:     Right lower leg: Edema (1+) present.     Left lower leg: Edema (1+) present.  Skin:    General: Skin is warm and dry.  Neurological:     Mental Status: He is alert.     Patient Lines/Drains/Airways Status     Active Line/Drains/Airways     Name Placement date Placement time Site Days   Peripheral IV 08/18/24 20 G Anterior;Left Forearm 08/18/24  0235  Forearm  less than 1            Pertinent labs and imaging:      Latest Ref Rng & Units 08/20/2024    4:44 AM 08/19/2024    5:11 AM 08/17/2024   10:52 PM  CBC  WBC 4.0 - 10.5 K/uL 8.4  9.2  9.1   Hemoglobin 13.0 - 17.0 g/dL 87.1  12.6  13.3   Hematocrit 39.0 - 52.0 % 39.9  39.4  42.1   Platelets 150 - 400 K/uL 255  244  255        Latest Ref Rng & Units 08/20/2024    4:44 AM 08/19/2024    5:11 AM 08/18/2024    1:04 PM  CMP  Glucose 70 - 99 mg/dL 95  894  831   BUN 6 - 20 mg/dL 25  22  18    Creatinine 0.61 - 1.24 mg/dL 8.60  8.53  8.64   Sodium 135 - 145 mmol/L 135  133  134   Potassium 3.5 - 5.1 mmol/L 3.3  3.4  3.8   Chloride 98 - 111 mmol/L 96  96  100   CO2 22 - 32 mmol/L 27  24  22    Calcium 8.9 - 10.3 mg/dL 8.7  8.3  8.1   Total Protein 6.5 - 8.1 g/dL  6.6    Total Bilirubin 0.0 - 1.2 mg/dL  1.4    Alkaline Phos 38 - 126 U/L  47    AST 15 - 41 U/L  28    ALT 0 - 44 U/L  20    Magnesium  1.8 Hemoglobin stable Potassium 3.3 Creatinine 1.39 (down from 1.46)  No results found.   ASSESSMENT/PLAN:  Assessment: Principal Problem:   Atrial flutter with rapid ventricular  response (HCC) Active Problems:   OSA (obstructive sleep apnea)   Acute on chronic diastolic heart failure (HCC)   DOE (dyspnea on exertion)   Severe mitral regurgitation s/p MVR   Elevated serum creatinine   Acute pulmonary edema (HCC)   Renal insufficiency   Plan: A-flutter with RVR (cardioversion in 2023) A-fib (MAZE procedure 2023) In sinus rhythm.  Heart rate around 80s -Metoprolol  50 mg XL daily -Continue Eliquis  5 mg twice daily  Acute on chronic HF exacerbation HFrEF (08/19/2024 EF 45-50%, decreased from 2023 EF of 70-75%) MV repair (s/p 11/2021) Likely 2/2 acute on chronic HF exacerbation 2/2 afib/aflutter  Wet and warm on phsyical exam.  Dry weight 265-270 pounds.  Weight today 279.9 pounds (yesterday documented 289) out 5.3 L yesterday.  -Cardiology consulted and recommending  - Furosemide  100 mg 3 times daily - Metoprolol  50 mg XL daily - Losartan 12.5 mg daily  - Start Jardiance 10 mg daily - Plan for TEE outpatient to evaluate AI  Hypokalemia 2/2 diuresis -F/u BMP -K goal >4, K3.3, KCl 40 mEq ordered for 0730, 1130, 1530 -Mg goal > 2, Mg 1.8, magnesium  sulfate IVPB 2 g ordered  HTN Blood pressure 105/63.  Maps in past 24 hours have been above 75 Home medications include metoprolol  tartrate 50 mg twice daily and spironolactone  25 mg daily.  -Furosemide  100 mg 3 times daily - Metoprolol  50 mg XL daily - Losartan 12.5 mg daily  - Jardiance 10 mg daily - Continue holding spironolactone  25 mg daily  AKI Creatinine 1.39 from 1.46.  Baseline appears to be 1.0.  Likely in setting of diuresis.  - F/u BMP  Decreased hemoglobin Hemoglobin 12.8.  Stable - Continue to trend  OSA Reports adherence to CPAP at home.  Refused CPAP last night due to different mask than what he has at home - CPAP ordered for inpatient stay  Best Practice: Diet: Cardiac diet IVF: Fluids: none, Rate: None VTE: Apixaban  5 mg twice daily Code: Full  Disposition planning: Therapy  Recs: Home, DME: none DISPO: Anticipated discharge pending cardiology recommendations.  Outpatient TEE  Signature:  Vineet Kinney [facility]  11:19 AM, 08/20/2024  On Call pager [phone]

## 2024-08-20 NOTE — Progress Notes (Signed)
 Pharmacist with students, rounding with the IMTS/[facility] Service. Patient is currently on apixaban  5 mg by mouth, twice daily for decreasing the likelihood of stroke in the setting of non-valvular atrial fibrillation. At the beside, provided the institutional hand-out for this drug explaining and discussing with provision of written handout that included the following information:  Apixaban  (Eliquis ) Explained, discussed, and provided a written handout that included the following information on apixaban  (Eliquis ):   1) Explained the indication for use: to decrease the likelihood of stroke in the setting of non-valvular atrial fibrillation.   2) Reviewed dose regimen, recommending to take the Eliquis  at the same time TWICE daily (morning and evening) with or without food.   3) Reviewed drug-diet instructions, including limiting alcohol consumption.   4) Recommended to take a missed dose as soon as remembered on the same day and resume taking twice daily - advised not to take 2 doses of Eliquis  at the same time.   5) Discussed the importance of adherence: Stopping without other stroke or VTE prevention medication to take the place of Eliquis  may increase your risk of developing a new clot or stroke. Refill your prescription before you run out.   6) Explained how to monitor for bleeding. Notify a HCP for unusual bruising, urine or stool color changes like red, dark brown, or black, bleeding from injury, or a nosebleed that does not stop, as well as if you have a serious fall or if you hit your head, even if there is no bleeding.   7) Future dose adjustments by your HCP may be needed if kidney function or your weight changes, or you add new medications.   Lynwood Lites, PharmD, CPP Clinical Pharmacist Practitioner
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
## 2024-08-20 NOTE — Telephone Encounter (Signed)
 Hi, can someone please have the provider sign the form that is scanned in media under eliquis  pap and fax back to me at [phone]? Thank you!

## 2024-08-20 NOTE — Progress Notes (Signed)
  Progress Note  Patient Name: DANDRAE KUSTRA Date of Encounter: 08/20/2024 [facility] Cardiologist: Darryle ONEIDA Decent, MD   Interval Summary   No events overnight. He reports significant improvement in dyspnea and walked down the hallway yesterday. He reports improvement in LE edema. -4.8L over last 24 hrs.   Vital Signs Vitals:   08/19/24 1614 08/19/24 1958 08/19/24 2320 08/20/24 0444  BP: 120/72 111/67 115/74 105/63  Pulse: 84 91 80 88  Resp: 16 19 16 16   Temp: 98.2 F (36.8 C) 98 F (36.7 C) 98 F (36.7 C) 98 F (36.7 C)  TempSrc: Oral Oral Oral Oral  SpO2: 95% 96% 96% 98%  Weight:    127 kg  Height:        Intake/Output Summary (Last 24 hours) at 08/20/2024 0745 Last data filed at 08/20/2024 0700 Gross per 24 hour  Intake 1546.3 ml  Output 6390 ml  Net -4843.7 ml      08/20/2024    4:44 AM 08/19/2024    9:00 AM 08/19/2024    3:58 AM  Last 3 Weights  Weight (lbs) 279 lb 14.4 oz 289 lb 288 lb 8 oz  Weight (kg) 126.962 kg 131.09 kg 130.863 kg      Telemetry/ECG  NSR w/ intermittent PVCs - Personally Reviewed  Physical Exam  GEN: No acute distress.   Neck: No JVD Cardiac: RRR w/ II/VI diastolic murmur loudest in RUSB, no murmurs, rubs, or gallops.  Respiratory: Clear to auscultation bilaterally. GI: Soft, nontender, non-distended  MS: 2+ LE edema bilaterally  Assessment & Plan  KELSEY EDMAN is a 53 y.o. year-old male with history of severe MR s/p annuloplasty 11/2021, persistent Afib s/p MAZE 11/2021, atypical aflutter s/p DCCV 12/2021, HFpEF, and OSA presenting with volume overload and AFL w/ RVR 119 .   #HFmrEF EF 45-50% (down from 70-75%) #Mod-severe AI #Severe MR s/p annuloplasty 11/2021 #Persistent Afib s/p MAZE 11/2021 #Atypical aflutter s/p DCCV 12/2021 - Improvement in dyspnea  - Cont IV lasix  100 TID, losartan 12.5 mg daily - Start Empagliflozin 10 mg daily - Switch metop 25 mg BID to 50 mg XL daily - Plan for TEE outpatient to evaluate AI    For questions or updates, please contact [facility] Please consult [email or web address] for contact info under   Signed, Joelle VEAR Ren Donley, MD

## 2024-08-21 LAB — CBC
HCT: 44.3 % (ref 39.0–52.0)
Hemoglobin: 14.4 g/dL (ref 13.0–17.0)
MCH: 28 pg (ref 26.0–34.0)
MCHC: 32.5 g/dL (ref 30.0–36.0)
MCV: 86.2 fL (ref 80.0–100.0)
Platelets: 265 K/uL (ref 150–400)
RBC: 5.14 MIL/uL (ref 4.22–5.81)
RDW: 14.9 % (ref 11.5–15.5)
WBC: 8.1 K/uL (ref 4.0–10.5)
nRBC: 0 % (ref 0.0–0.2)

## 2024-08-21 LAB — BASIC METABOLIC PANEL WITH GFR
Anion gap: 14 (ref 5–15)
BUN: 28 mg/dL — ABNORMAL HIGH (ref 6–20)
CO2: 24 mmol/L (ref 22–32)
Calcium: 8.7 mg/dL — ABNORMAL LOW (ref 8.9–10.3)
Chloride: 97 mmol/L — ABNORMAL LOW (ref 98–111)
Creatinine, Ser: 1.36 mg/dL — ABNORMAL HIGH (ref 0.61–1.24)
GFR, Estimated: >60 mL/min (ref >60)
Glucose, Bld: 122 mg/dL — ABNORMAL HIGH (ref 70–99)
Potassium: 3.8 mmol/L (ref 3.5–5.1)
Sodium: 135 mmol/L (ref 135–145)

## 2024-08-21 LAB — MAGNESIUM: Magnesium: 2.3 mg/dL (ref 1.7–2.4)

## 2024-08-21 MED ORDER — POTASSIUM CHLORIDE CRYS ER 20 MEQ PO TBCR
30.0000 meq | EXTENDED_RELEASE_TABLET | Freq: Once | ORAL | Status: AC
  Administered 2024-08-21: 30 meq via ORAL
  Filled 2024-08-21: qty 1

## 2024-08-21 MED ORDER — METOPROLOL SUCCINATE ER 50 MG PO TB24
75.0000 mg | ORAL_TABLET | Freq: Every day | ORAL | Status: DC
  Administered 2024-08-21 – 2024-08-22 (×2): 75 mg via ORAL
  Filled 2024-08-21 (×2): qty 1

## 2024-08-21 MED ORDER — LOSARTAN POTASSIUM 25 MG PO TABS
25.0000 mg | ORAL_TABLET | Freq: Every day | ORAL | Status: DC
Start: 2024-08-21 — End: 2024-08-22
  Administered 2024-08-21 – 2024-08-22 (×2): 25 mg via ORAL
  Filled 2024-08-21 (×2): qty 1

## 2024-08-21 MED ORDER — POTASSIUM CHLORIDE CRYS ER 20 MEQ PO TBCR
40.0000 meq | EXTENDED_RELEASE_TABLET | Freq: Once | ORAL | Status: AC
  Administered 2024-08-21: 40 meq via ORAL
  Filled 2024-08-21: qty 2

## 2024-08-21 NOTE — Progress Notes (Signed)
 HD#3 SUBJECTIVE:  Patient Summary: Anthony Byrd is a 53 y.o. with a pertinent PMH of atrial flutter, A-fib s/p maze procedure 12/2021, severe mitral regurgitation s/p MV repair 11/2021, HFpEF (05/2022: EF 70 to 75%, 07/2024 EF: 40-50%), OSA, who presented with progressive leg swelling and SOB for the past 2 weeks and admitted for A-flutter with RVR and acute on chronic heart failure exacerbation.   Echo:  EF 40-50%. (Moderately reduced) mildly decreased LV function.  LV demonstrates global hypokinesis.  Elevated LA pressure.  Moderately reduced RV function.  Mildly elevated pulmonary artery systolic pressure.  LA moderately dilated.  Mild MR.  TR mild to moderate.  Moderate calcification of aortic valve.  Mild to moderate AS.  Moderate to severe AR.  Dilated IVC  Overnight Events: No overnight events  Interim History: Patient seen at bedside.  Denies chest pain, shortness of breath, abdominal pain, difficulty with urination.  Endorsed feeling good with walking and decreased swelling.  OBJECTIVE:  Vital Signs: Vitals:   08/21/24 0426 08/21/24 0900 08/21/24 1027 08/21/24 1140  BP: 120/66 106/73 122/80 122/78  Pulse: 82  88 83  Resp: 16 18  19   Temp: 98.1 F (36.7 C) 97.7 F (36.5 C)  97.7 F (36.5 C)  TempSrc: Oral Oral  Oral  SpO2: 94%   96%  Weight: 124.5 kg     Height:       Supplemental O2: Room Air SpO2: 96 %  Filed Weights   08/19/24 0900 08/20/24 0444 08/21/24 0426  Weight: 131.1 kg 127 kg 124.5 kg     Intake/Output Summary (Last 24 hours) at 08/21/2024 1209 Last data filed at 08/21/2024 1031 Gross per 24 hour  Intake 600 ml  Output 7000 ml  Net -6400 ml   Net IO Since Admission: -13,713.7 mL [08/21/24 1209]  Physical Exam: Physical Exam Constitutional:      Appearance: He is obese.  Cardiovascular:     Rate and Rhythm: Normal rate and regular rhythm.     Heart sounds: Murmur (2/6 systolic left 2nd ICS) heard.     No friction rub. No gallop.  Pulmonary:      Effort: Pulmonary effort is normal. No accessory muscle usage or respiratory distress.     Breath sounds: No decreased breath sounds, wheezing, rhonchi or rales.  Abdominal:     General: Bowel sounds are normal.     Palpations: Abdomen is soft.     Tenderness: There is no abdominal tenderness. There is no guarding.  Musculoskeletal:     Right lower leg: Edema (1+) present.     Left lower leg: Edema (1+) present.  Skin:    General: Skin is warm and dry.  Neurological:     Mental Status: He is alert.     Patient Lines/Drains/Airways Status     Active Line/Drains/Airways     Name Placement date Placement time Site Days   Peripheral IV 08/18/24 20 G Anterior;Left Forearm 08/18/24  0235  Forearm  less than 1            Pertinent labs and imaging:      Latest Ref Rng & Units 08/21/2024    5:01 AM 08/20/2024    4:44 AM 08/19/2024    5:11 AM  CBC  WBC 4.0 - 10.5 K/uL 8.1  8.4  9.2   Hemoglobin 13.0 - 17.0 g/dL 85.5  87.1  87.3   Hematocrit 39.0 - 52.0 % 44.3  39.9  39.4   Platelets 150 -  400 K/uL 265  255  244        Latest Ref Rng & Units 08/21/2024    5:01 AM 08/20/2024    4:44 AM 08/19/2024    5:11 AM  CMP  Glucose 70 - 99 mg/dL 877  95  894   BUN 6 - 20 mg/dL 28  25  22    Creatinine 0.61 - 1.24 mg/dL 8.63  8.60  8.53   Sodium 135 - 145 mmol/L 135  135  133   Potassium 3.5 - 5.1 mmol/L 3.8  3.3  3.4   Chloride 98 - 111 mmol/L 97  96  96   CO2 22 - 32 mmol/L 24  27  24    Calcium 8.9 - 10.3 mg/dL 8.7  8.7  8.3   Total Protein 6.5 - 8.1 g/dL   6.6   Total Bilirubin 0.0 - 1.2 mg/dL   1.4   Alkaline Phos 38 - 126 U/L   47   AST 15 - 41 U/L   28   ALT 0 - 44 U/L   20     No results found.   ASSESSMENT/PLAN:  Assessment: Principal Problem:   Atrial flutter with rapid ventricular response (HCC) Active Problems:   OSA (obstructive sleep apnea)   Acute on chronic diastolic heart failure (HCC)   DOE (dyspnea on exertion)   Severe mitral regurgitation s/p MVR    Elevated serum creatinine   Acute pulmonary edema (HCC)   Renal insufficiency  Plan: A-flutter with RVR (cardioversion in 2023) A-fib (MAZE procedure 2023) In sinus rhythm.  Heart rate around 80s -Metoprolol  50 mg XL daily -Continue Eliquis  5 mg twice daily  Acute on chronic HF exacerbation HFrEF (08/19/2024 EF 45-50%, decreased from 2023 EF of 70-75%) MV repair (s/p 11/2021) Likely 2/2 acute on chronic HF exacerbation 2/2 afib/aflutter  Wet and warm on phsyical exam.  Lower extremity edema.  Lungs clear to auscultation bilaterally. -5.1 L out, net out -4 L.  279.9 pounds --> 274.4 today -Cardiology consulted and recommending  - Furosemide  100 mg 3 times daily - Metoprolol  50 mg XL daily - Losartan 12.5 mg daily  - Start Jardiance 10 mg daily - Plan for TEE outpatient to evaluate AI  Hypokalemia 2/2 diuresis -F/u BMP -K goal >4, K3.8, Kcl 40 mEq ordered -Mg goal > 2, Mg 2.3  HTN Blood pressure 120/66.  MAPs in past 24 hours have been above 75 Home medications include metoprolol  tartrate 50 mg twice daily and spironolactone  25 mg daily.  -Furosemide  100 mg 3 times daily - Metoprolol  50 mg XL daily - Losartan 12.5 mg daily  - Jardiance 10 mg daily - Continue holding spironolactone  25 mg daily  AKI Creatinine 1.36.  Baseline appears to be 1.0.  Likely in setting of diuresis.  - F/u BMP  OSA Reports adherence to CPAP at home.  - CPAP ordered for inpatient stay  Best Practice: Diet: Cardiac diet IVF: Fluids: none, Rate: None VTE: Apixaban  5 mg twice daily Code: Full  Disposition planning: Therapy Recs: Home, DME: none DISPO: Anticipated discharge pending cardiology recommendations.  Outpatient TEE  Signature:  Bobby Ragan [facility]  12:09 PM, 08/21/2024  On Call pager [phone]

## 2024-08-21 NOTE — Plan of Care (Signed)
   Problem: Education: Goal: Knowledge of General Education information will improve Description Including pain rating scale, medication(s)/side effects and non-pharmacologic comfort measures Outcome: Progressing

## 2024-08-21 NOTE — Telephone Encounter (Signed)
 Provider portion signed and faxed back to number provided.

## 2024-08-21 NOTE — Telephone Encounter (Signed)
 PAP: Application for Eliquis has been submitted to [employer] ([employer]), via fax

## 2024-08-21 NOTE — Progress Notes (Signed)
  Progress Note  Patient Name: Anthony Byrd Date of Encounter: 08/21/2024 [facility] Cardiologist: Darryle ONEIDA Decent, MD   Interval Summary   Around 5 AM, patient had a few seconds of WCT going about 150 bpm. He denies any symptoms during that time. He reports significant improvement in dyspnea and LE edema.   Vital Signs Vitals:   08/20/24 1221 08/20/24 1616 08/20/24 2300 08/21/24 0426  BP: 117/77 111/77 103/65 120/66  Pulse: 79  80 82  Resp: 18 16 18 16   Temp: (!) 97.5 F (36.4 C) 97.7 F (36.5 C) 97.7 F (36.5 C) 98.1 F (36.7 C)  TempSrc: Oral Oral Oral Oral  SpO2: 94%  94% 94%  Weight:    124.5 kg  Height:        Intake/Output Summary (Last 24 hours) at 08/21/2024 0642 Last data filed at 08/21/2024 0421 Gross per 24 hour  Intake 1100 ml  Output 5820 ml  Net -4720 ml      08/21/2024    4:26 AM 08/20/2024    4:44 AM 08/19/2024    9:00 AM  Last 3 Weights  Weight (lbs) 274 lb 6.4 oz 279 lb 14.4 oz 289 lb  Weight (kg) 124.467 kg 126.962 kg 131.09 kg      Telemetry/ECG  WCT 150 bpm for a few seconds - Personally Reviewed  Physical Exam  GEN: No acute distress.   Neck: No JVD Cardiac: RRR w/ II/VI diastolic murmur loudest in RUSB, no murmurs, rubs, or gallops.  Respiratory: Clear to auscultation bilaterally. GI: Soft, nontender, non-distended  MS: 1+ LE edema  Assessment & Plan  BANDON SHERWIN is a 53 y.o. year-old male with history of severe MR s/p annuloplasty 11/2021, persistent Afib s/p MAZE 11/2021, atypical aflutter s/p DCCV 12/2021, HFpEF, and OSA presenting with volume overload and AFL w/ RVR 119 .   #HFmrEF EF 45-50% (down from 70-75%) #Mod-severe AI #Severe MR s/p annuloplasty 11/2021 #Persistent Afib s/p MAZE 11/2021 #Atypical aflutter s/p DCCV 12/2021 - Continues to report improvement in dyspnea and LE edema. - Cont IV lasix  100 TID; will need K > 4 and Mag > 2 given WCT on telemetry.  - Increase losartan to 25 mg and cont Empa 10 - Cont  metop XL to 75 mg daily given tele findings - Plan for TEE outpatient to evaluate AI   For questions or updates, please contact [facility] Please consult [email or web address] for contact info under   Signed, Joelle VEAR Ren Donley, MD

## 2024-08-22 ENCOUNTER — Telehealth: Payer: Self-pay | Admitting: Physician Assistant

## 2024-08-22 ENCOUNTER — Other Ambulatory Visit (HOSPITAL_COMMUNITY): Payer: Self-pay

## 2024-08-22 LAB — CBC
HCT: 46.4 % (ref 39.0–52.0)
Hemoglobin: 15 g/dL (ref 13.0–17.0)
MCH: 27.6 pg (ref 26.0–34.0)
MCHC: 32.3 g/dL (ref 30.0–36.0)
MCV: 85.3 fL (ref 80.0–100.0)
Platelets: 279 K/uL (ref 150–400)
RBC: 5.44 MIL/uL (ref 4.22–5.81)
RDW: 14.9 % (ref 11.5–15.5)
WBC: 8.5 K/uL (ref 4.0–10.5)
nRBC: 0 % (ref 0.0–0.2)

## 2024-08-22 LAB — BASIC METABOLIC PANEL WITH GFR
Anion gap: 14 (ref 5–15)
BUN: 28 mg/dL — ABNORMAL HIGH (ref 6–20)
CO2: 26 mmol/L (ref 22–32)
Calcium: 9.2 mg/dL (ref 8.9–10.3)
Chloride: 96 mmol/L — ABNORMAL LOW (ref 98–111)
Creatinine, Ser: 1.43 mg/dL — ABNORMAL HIGH (ref 0.61–1.24)
GFR, Estimated: 58 mL/min — ABNORMAL LOW (ref >60)
Glucose, Bld: 97 mg/dL (ref 70–99)
Potassium: 3.8 mmol/L (ref 3.5–5.1)
Sodium: 136 mmol/L (ref 135–145)

## 2024-08-22 LAB — MAGNESIUM: Magnesium: 2.2 mg/dL (ref 1.7–2.4)

## 2024-08-22 MED ORDER — EMPAGLIFLOZIN 10 MG PO TABS
10.0000 mg | ORAL_TABLET | Freq: Every day | ORAL | 0 refills | Status: DC
  Filled 2024-08-22: qty 30, 30d supply, fill #0

## 2024-08-22 MED ORDER — POTASSIUM CHLORIDE CRYS ER 20 MEQ PO TBCR
40.0000 meq | EXTENDED_RELEASE_TABLET | Freq: Once | ORAL | Status: AC
  Administered 2024-08-22: 40 meq via ORAL
  Filled 2024-08-22: qty 2

## 2024-08-22 MED ORDER — TORSEMIDE 20 MG PO TABS
40.0000 mg | ORAL_TABLET | Freq: Two times a day (BID) | ORAL | 0 refills | Status: DC
  Filled 2024-08-22: qty 80, 20d supply, fill #0

## 2024-08-22 MED ORDER — LOSARTAN POTASSIUM 25 MG PO TABS
25.0000 mg | ORAL_TABLET | Freq: Every day | ORAL | 0 refills | Status: DC
  Filled 2024-08-22: qty 30, 30d supply, fill #0

## 2024-08-22 MED ORDER — METOPROLOL SUCCINATE ER 25 MG PO TB24
75.0000 mg | ORAL_TABLET | Freq: Every day | ORAL | 0 refills | Status: DC
  Filled 2024-08-22: qty 90, 30d supply, fill #0

## 2024-08-22 MED ORDER — TORSEMIDE 20 MG PO TABS
40.0000 mg | ORAL_TABLET | Freq: Two times a day (BID) | ORAL | Status: DC
  Administered 2024-08-22: 40 mg via ORAL
  Filled 2024-08-22: qty 2

## 2024-08-22 MED ORDER — POTASSIUM CHLORIDE CRYS ER 20 MEQ PO TBCR
20.0000 meq | EXTENDED_RELEASE_TABLET | Freq: Every day | ORAL | 0 refills | Status: AC
  Filled 2024-08-22: qty 14, 14d supply, fill #0

## 2024-08-22 NOTE — Telephone Encounter (Addendum)
 Hi nursing team, this is a patient that Dr. Deneise saw in the hosiptal whom he wants to have an outpatient TEE arranged for aortic insufficiency. I know this requires a different outpatient process to arrange than we do inpatient, with pre-certification and calling patient with instructions, so appreciate your help. If you can let me know when this is scheduled for, I can assist with placing orders. Dr. Deneise completed consent in his rounding note for today. The patient has follow-up with the [facility] clinic 10/13 and I also arranged general cardiology f/u for him on 10/27. Thank you!

## 2024-08-22 NOTE — Progress Notes (Addendum)
  Progress Note  Patient Name: Anthony Byrd Date of Encounter: 08/22/2024 [facility] Cardiologist: Darryle ONEIDA Decent, MD   Interval Summary   No events overnight. He denies any dyspnea and feels like his LE edema is pretty much gone. He denies any CP.   Vital Signs Vitals:   08/21/24 1556 08/21/24 1940 08/21/24 2343 08/22/24 0501  BP: 116/78 98/79 111/68 118/69  Pulse: 86 79 83 75  Resp: 19 18 18 20   Temp: 98.2 F (36.8 C) 98.1 F (36.7 C) 97.6 F (36.4 C) 97.6 F (36.4 C)  TempSrc: Oral Oral Oral Oral  SpO2: 91% 91%  97%  Weight:    121.6 kg  Height:        Intake/Output Summary (Last 24 hours) at 08/22/2024 0725 Last data filed at 08/21/2024 2221 Gross per 24 hour  Intake 780 ml  Output 4705 ml  Net -3925 ml      08/22/2024    5:01 AM 08/21/2024    4:26 AM 08/20/2024    4:44 AM  Last 3 Weights  Weight (lbs) 268 lb 274 lb 6.4 oz 279 lb 14.4 oz  Weight (kg) 121.564 kg 124.467 kg 126.962 kg      Telemetry/ECG  Intermittent PVCs - Personally Reviewed  Physical Exam  GEN: No acute distress.   Neck: No JVD Cardiac: RRR w/ II/VI diastolic murmur loudest in RUSB, no murmurs, rubs, or gallops.  Respiratory: Clear to auscultation bilaterally. GI: Soft, nontender, non-distended  MS: No edema  Assessment & Plan  Anthony Byrd is a 53 y.o. year-old male with history of severe MR s/p annuloplasty 11/2021, persistent Afib s/p MAZE 11/2021, atypical aflutter s/p DCCV 12/2021, HFpEF, and OSA presenting with volume overload and AFL w/ RVR 119 .   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 -16.0L since admission.  - d/c IV lasix ; can be d/c on torsemide 40 mg BID - Cont losartan 25, metop XL 75, and Empa 10 - Can transition to Entresto and add MRA outpatient - Plan for TEE outpatient to evaluate AI  and consider ischemic eval given EF drop - We will sign off; feel free to call us  if you have any questions  Informed Consent   Shared Decision Making/Informed Consent   The risks [esophageal damage, perforation (1:10,000 risk), bleeding, pharyngeal hematoma as well as other potential complications associated with conscious sedation including aspiration, arrhythmia, respiratory failure and death], benefits (treatment guidance and diagnostic support) and alternatives of a transesophageal echocardiogram were discussed in detail with Anthony Byrd and he is willing to proceed.       For questions or updates, please contact [facility] Please consult [email or web address] for contact info under    Joelle VEAR Ren Donley, MD

## 2024-08-22 NOTE — Discharge Summary (Signed)
 Name: Anthony Byrd MRN: [id number] DOB: 09-Dec-1970 53 y.o. PCP: Elicia Sharper, DO  Date of Admission: 08/17/2024 10:42 PM Date of Discharge: 08/22/2024 Attending Physician: Dr. MICAEL Riis Winfrey  Discharge Diagnosis: 1. Principal Problem:   Atrial flutter with rapid ventricular response (HCC) Active Problems:   OSA (obstructive sleep apnea)   Acute on chronic diastolic heart failure (HCC)   DOE (dyspnea on exertion)   Severe mitral regurgitation s/p MVR   Elevated serum creatinine   Acute pulmonary edema (HCC)   Renal insufficiency  Discharge Medications: Allergies as of 08/22/2024       Reactions   Peanut-containing Drug Products Nausea And Vomiting   Pt reports tolerance to small amount of peanut now        Medication List     PAUSE taking these medications    spironolactone  25 MG tablet Wait to take this until your doctor or other care provider tells you to start again. Commonly known as: ALDACTONE  Take 1 tablet (25 mg total) by mouth daily. NEED OV.       STOP taking these medications    furosemide  40 MG tablet Commonly known as: LASIX    metoprolol  tartrate 50 MG tablet Commonly known as: LOPRESSOR        TAKE these medications    acetaminophen  500 MG tablet Commonly known as: TYLENOL  Take 1,000 mg by mouth every 6 (six) hours as needed for mild pain.   apixaban  5 MG Tabs tablet Commonly known as: ELIQUIS  Take 1 tablet (5 mg total) by mouth 2 (two) times daily.   Jardiance 10 MG Tabs tablet Generic drug: empagliflozin Take 1 tablet (10 mg total) by mouth daily. Start taking on: August 23, 2024   losartan 25 MG tablet Commonly known as: COZAAR Take 1 tablet (25 mg total) by mouth daily. Start taking on: August 23, 2024   metoprolol  succinate 25 MG 24 hr tablet Commonly known as: TOPROL -XL Take 3 tablets (75 mg total) by mouth daily. Start taking on: August 23, 2024   potassium chloride  SA 20 MEQ tablet Commonly known as: KLOR-CON   M Take 1 tablet (20 mEq total) by mouth daily.   torsemide 20 MG tablet Commonly known as: DEMADEX Take 2 tablets (40 mg total) by mouth 2 (two) times daily.        Disposition and follow-up:   Anthony Byrd was discharged from [facility] in Good condition.  At the hospital follow up visit please address:  HFrEF, hypertension, A-flutter with RVR: New echo demonstrated HFmrEF (45 to 50%, down from 70 to 75%) started on torsemide 40 mg twice daily, metoprolol  75 mg XL daily, losartan 25 mg daily, Jardiance 10 mg daily.  Holding spironolactone  25 mg daily.  Plan is for TEE outpatient with cardiology, transition to Entresto, and add back spironolactone .  Hypokalemia: Discharged on 20 mEq potassium tablets daily.  With aggressive diuresis in hospital, required supplementation daily.  Recheck  AKI: In setting of aggressive diuresis, creatinine 1.43 at discharge.  Baseline appears to be 1.0  2.  Labs / imaging needed at time of follow-up: BMP  3.  Pending labs/ test needing follow-up: N/A  Follow-up Appointments:  Follow-up Information     Elicia Sharper, DO Follow up.   Specialty: Internal Medicine Why: 09/02/2024 8:45 AM Contact information: [address], [address] [place] [address] [phone]         [facility]. Go in 10 day(s).  Specialty: Cardiology Why: Hospital follow up 09/01/2024 @ 8:15 am  PLEASE bring a current medication list to appointment FREE valet parking, Entrance C, off [employer] for women and [facility] entrance Contact information: [address] [place]  [phone]        [facility] at [facility] A Dept of [employer]. [facility] Follow up.   Specialty: Cardiology Why: We have scheduled you a general cardiology follow-up appointment with Hao Meng, PA-C on Monday Sep 15, 2024 10:05 AM (Arrive by 9:45 AM).  The  cardiology office will contact you to go over arranging a procedure called a transesophageal echocardiogram that is done at the hospital. Contact information: [address] [place]  [address] [phone]                 Hospital Course by problem list: Anthony Byrd is a 53 y.o. person living with a history of typical atrial flutter, A-Fib s/p MAZE procedure (12/2021), severe mitral regurgitation s/p MVRepair (11/2021), HFpEF (05/2022 Echo: EF 70-75%), and OSA who presents with progressive leg swelling and SOB over the last 2 weeks and admitted for A-flutter with RVR and acute on chronic heart failure exacerbation now being discharged on hospital day 4 with the following pertinent hospital course:   #A-Flutter with RVR (cardioversion 2023) #A-Fib (Maze procedure 2023) #MVRepair (status post 11/2021) #Tachycardia  Patient has a history of A-Flutter s/p MAZE procedure (12/2021) and severe mitral regurgitation s/p MVRepair (11/2021) and cardioversion. He notes that he has been in his normal state since those procedures up until now. He is on Eliquis  5 mg BID and Metoprolol  50 mg BID at home which he reports complete adherence to and stats he took his PM dose on 9/28.  During this hospitalization, patient was initially started on amiodarone  infusion which was transition off to Lopressor  and Eliquis .  Otherwise he has been rate controlled and back to NSR.   #Acute on Chronic HF Exacerbation #HFpEF (05/2022 Echo: EF 70-75%) #Pulmonary Edema  #Leg swelling #SOB Patient has a history of HFpEF, last Echo was 05/2022 which showed EF of 70-75%. He used to follow with Cardiology but has not seen them for some time. He does note a cough that began about 2 weeks ago which was productive of clear-yellow sputum, and he feels that this cough precipitated his rapid heart rate, and then SOB/leg swelling. BNP is wnl at 41.8. However, CXR shows cardiomegaly and pulmonary edema.  On exam, patient  was noted to be volume overloaded, pitting edema up to bilateral hips and mild edema of lower pannus.  Echo showed EF 45 to 50%, moderately reduced from 2023.  Cardiology was consulted, patient has been well diuresed throughout this hospitalization with IV Lasix .  He is back to his dry weight, 265 to 270 pounds.  Patient is discharged on torsemide 40 mg twice daily, losartan 25 mg, metoprolol  XL 75 mg, Jardiance 10 mg.  Per cardiology, can transition to Entresto and add MRA in the outpatient setting.  Patient has a follow-up appointment with cardiology on 1013 with heart failure team and general cardiology on 10/27 to plan for outpatient TEE.   #HTN Takes Metoprolol  50 mg BID and Spironolactone  25 mg daily. - Patient was slowly restarted on home medications consistent metoprolol , was started on losartan and Jardiance.   #Elevated Cr Cr on admission of 1.33. His baseline appears to be around 1.0 - 1.2.  Creatinine at discharge is 1.43 (from  1.36), likely from overdiuresis.  Will recheck in the outpatient setting.   #OSA History of OSA, CPAP at night.  Subjective Patient seen bedside.  Reports doing well.  Denies shortness of breath, chest pain, palpitations, abdominal pain, shortness of breath on exertion.  Endorses decreased swelling in legs.  Said he feels well and he is ready to leave.  Discharge Exam:   BP 113/62 (BP Location: Left Arm)   Pulse 83   Temp 97.8 F (36.6 C) (Oral)   Resp 18   Ht 5' 8 (1.727 m)   Wt 121.6 kg   SpO2 95%   BMI 40.75 kg/m   Discharge exam:  Physical Exam Constitutional:      General: He is not in acute distress.    Appearance: He is obese. He is not ill-appearing or toxic-appearing.  Cardiovascular:     Rate and Rhythm: Normal rate and regular rhythm.     Pulses: No decreased pulses.     Heart sounds: Murmur (Systolic grade 2/6 Left 2nd ICS) heard.  Pulmonary:     Effort: Pulmonary effort is normal. No tachypnea, bradypnea, accessory muscle usage or  respiratory distress.     Breath sounds: Normal breath sounds. No decreased breath sounds, wheezing, rhonchi or rales.  Abdominal:     Palpations: Abdomen is soft.     Tenderness: There is no abdominal tenderness. There is no guarding.  Musculoskeletal:     Right lower leg: No edema.     Left lower leg: No edema.  Neurological:     Mental Status: He is alert.     Pertinent Labs, Studies, and Procedures:     Latest Ref Rng & Units 08/22/2024    4:59 AM 08/21/2024    5:01 AM 08/20/2024    4:44 AM  CBC  WBC 4.0 - 10.5 K/uL 8.5  8.1  8.4   Hemoglobin 13.0 - 17.0 g/dL 84.9  85.5  87.1   Hematocrit 39.0 - 52.0 % 46.4  44.3  39.9   Platelets 150 - 400 K/uL 279  265  255        Latest Ref Rng & Units 08/22/2024    4:59 AM 08/21/2024    5:01 AM 08/20/2024    4:44 AM  CMP  Glucose 70 - 99 mg/dL 97  877  95   BUN 6 - 20 mg/dL 28  28  25    Creatinine 0.61 - 1.24 mg/dL 8.56  8.63  8.60   Sodium 135 - 145 mmol/L 136  135  135   Potassium 3.5 - 5.1 mmol/L 3.8  3.8  3.3   Chloride 98 - 111 mmol/L 96  97  96   CO2 22 - 32 mmol/L 26  24  27    Calcium 8.9 - 10.3 mg/dL 9.2  8.7  8.7     ECHOCARDIOGRAM COMPLETE Result Date: 08/18/2024    ECHOCARDIOGRAM REPORT   Patient Name:   Anthony Byrd Date of Exam: 08/18/2024 Medical Rec #:  [id number]      Height:       68.0 in Accession #:    [id number]     Weight:       279.5 lb Date of Birth:  12-03-1970      BSA:          2.356 m Patient Age:    53 years       BP:           112/71 mmHg Patient Gender: M  HR:           112 bpm. Exam Location:  Inpatient Procedure: 2D Echo and Intracardiac Opacification Agent (Both Spectral and Color            Flow Doppler were utilized during procedure). Indications:    Atrial Flutter  History:        Patient has prior history of Echocardiogram examinations. CHF.                  Mitral Valve: prosthetic annuloplasty ring valve is present in                 the mitral position. Procedure Date: 2023.  Sonographer:     Charmaine Gaskins Referring Phys: [id number] ELSIE KATHEE SAVANNAH  Sonographer Comments: Technically difficult study due to poor echo windows. IMPRESSIONS  1. Respirophasic septal motion. Can be seen in PH/RV failure, obesity. Left ventricular ejection fraction, by estimation, is 45 to 50%. The left ventricle has mildly decreased function. The left ventricle demonstrates global hypokinesis. Left ventricular diastolic function could not be evaluated. Elevated left atrial pressure.  2. Right ventricular systolic function is moderately reduced. The right ventricular size is normal. There is mildly elevated pulmonary artery systolic pressure. The estimated right ventricular systolic pressure is 39.2 mmHg.  3. Left atrial size was moderately dilated.  4. No significant MS, mitral gradient driven primarily by heart rate. The mitral valve has been repaired/replaced. Mild mitral valve regurgitation. No evidence of mitral stenosis. The mean mitral valve gradient is 7.0 mmHg. There is a prosthetic annuloplasty ring present in the mitral position. Procedure Date: 2023.  5. The tricuspid valve is degenerative. Tricuspid valve regurgitation is mild to moderate.  6. The aortic valve has an indeterminant number of cusps. There is moderate calcification of the aortic valve. There is moderate thickening of the aortic valve. Aortic valve regurgitation is moderate to severe. Mild to moderate aortic valve stenosis. Aortic valve area, by VTI measures 1.32 cm. Aortic valve mean gradient measures 18.0 mmHg. Aortic valve Vmax measures 2.67 m/s.  7. The inferior vena cava is dilated in size with <50% respiratory variability, suggesting right atrial pressure of 15 mmHg. FINDINGS  Left Ventricle: Respirophasic septal motion. Can be seen in PH/RV failure, obesity. Left ventricular ejection fraction, by estimation, is 45 to 50%. The left ventricle has mildly decreased function. The left ventricle demonstrates global hypokinesis. Definity  contrast  agent was given IV to delineate the left ventricular endocardial borders. The left ventricular internal cavity size was normal in size. There is no left ventricular hypertrophy. Left ventricular diastolic function could not be evaluated  due to mitral annular calcification (moderate or greater). Left ventricular diastolic function could not be evaluated. Elevated left atrial pressure. Right Ventricle: The right ventricular size is normal. No increase in right ventricular wall thickness. Right ventricular systolic function is moderately reduced. There is mildly elevated pulmonary artery systolic pressure. The tricuspid regurgitant velocity is 2.46 m/s, and with an assumed right atrial pressure of 15 mmHg, the estimated right ventricular systolic pressure is 39.2 mmHg. Left Atrium: Left atrial size was moderately dilated. Right Atrium: Right atrial size was normal in size. Pericardium: There is no evidence of pericardial effusion. Mitral Valve: No significant MS, mitral gradient driven primarily by heart rate. The mitral valve has been repaired/replaced. Moderately decreased mobility of the mitral valve leaflets. Mild mitral valve regurgitation. There is a prosthetic annuloplasty ring present in the mitral position. Procedure Date: 2023. No evidence of mitral valve  stenosis. MV peak gradient, 17.7 mmHg. The mean mitral valve gradient is 7.0 mmHg. Tricuspid Valve: The tricuspid valve is degenerative in appearance. Tricuspid valve regurgitation is mild to moderate. No evidence of tricuspid stenosis. Aortic Valve: The aortic valve has an indeterminant number of cusps. There is moderate calcification of the aortic valve. There is moderate thickening of the aortic valve. Aortic valve regurgitation is moderate to severe. Mild to moderate aortic stenosis  is present. Aortic valve mean gradient measures 18.0 mmHg. Aortic valve peak gradient measures 28.5 mmHg. Aortic valve area, by VTI measures 1.32 cm. Pulmonic Valve: The  pulmonic valve was normal in structure. Pulmonic valve regurgitation is not visualized. No evidence of pulmonic stenosis. Aorta: The aortic root is normal in size and structure. Venous: The inferior vena cava is dilated in size with less than 50% respiratory variability, suggesting right atrial pressure of 15 mmHg. IAS/Shunts: No atrial level shunt detected by color flow Doppler.  LEFT VENTRICLE PLAX 2D LVIDd:         5.00 cm LVIDs:         3.10 cm LV PW:         1.00 cm LV IVS:        0.90 cm LVOT diam:     2.20 cm LV SV:         65 LV SV Index:   28 LVOT Area:     3.80 cm  LV Volumes (MOD) LV vol d, MOD A2C: 107.0 ml LV vol d, MOD A4C: 116.0 ml LV vol s, MOD A2C: 69.2 ml LV vol s, MOD A4C: 65.9 ml LV SV MOD A2C:     37.8 ml LV SV MOD A4C:     116.0 ml LV SV MOD BP:      47.4 ml RIGHT VENTRICLE RV Basal diam:  3.60 cm RV Mid diam:    3.40 cm RV S prime:     6.96 cm/s TAPSE (M-mode): 1.1 cm LEFT ATRIUM              Index        RIGHT ATRIUM           Index LA diam:        4.60 cm  1.95 cm/m   RA Area:     21.20 cm LA Vol (A2C):   92.6 ml  39.30 ml/m  RA Volume:   61.40 ml  26.06 ml/m LA Vol (A4C):   103.0 ml 43.71 ml/m LA Biplane Vol: 99.8 ml  42.35 ml/m  AORTIC VALVE AV Area (Vmax):    1.30 cm AV Area (Vmean):   1.29 cm AV Area (VTI):     1.32 cm AV Vmax:           267.00 cm/s AV Vmean:          202.667 cm/s AV VTI:            0.494 m AV Peak Grad:      28.5 mmHg AV Mean Grad:      18.0 mmHg LVOT Vmax:         91.23 cm/s LVOT Vmean:        69.000 cm/s LVOT VTI:          0.171 m LVOT/AV VTI ratio: 0.35  AORTA Ao Root diam: 3.30 cm Ao Asc diam:  3.25 cm MITRAL VALVE               TRICUSPID VALVE MV Area (PHT): 5.00 cm  TR Peak grad:   24.2 mmHg MV Area VTI:   1.82 cm    TR Vmax:        246.00 cm/s MV Peak grad:  17.7 mmHg MV Mean grad:  7.0 mmHg    SHUNTS MV Vmax:       2.10 m/s    Systemic VTI:  0.17 m MV Vmean:      119.3 cm/s  Systemic Diam: 2.20 cm Morene Brownie Electronically signed by Morene Brownie Signature Date/Time: 08/18/2024/4:45:55 PM    Final    DG Chest 2 View Result Date: 08/17/2024 EXAM: 2 VIEW(S) XRAY OF THE CHEST 08/17/2024 11:12:00 PM COMPARISON: X-ray 12/23/2021 CLINICAL HISTORY: cough sob feet and ankle swelling for 2 weeks on eliquis . Cough, shortness of breath, ankle swelling. FINDINGS: LUNGS AND PLEURA: No focal pulmonary opacity. Diffuse pulmonary edema. No pleural effusion. No pneumothorax. HEART AND MEDIASTINUM: Cardiomegaly. Prosthetic mitral valve. Left atrial appendage clip. BONES AND SOFT TISSUES: Surgical clips in upper abdomen. Intact sternotomy wires. IMPRESSION: 1. Cardiomegaly and pulmonary edema . Electronically signed by: Norman Gatlin MD 08/17/2024 11:25 PM EDT RP Workstation: [id number]     Discharge Instructions: Discharge Instructions     (HEART FAILURE PATIENTS) Call MD:  Anytime you have any of the following symptoms: 1) 3 pound weight gain in 24 hours or 5 pounds in 1 week 2) shortness of breath, with or without a dry hacking cough 3) swelling in the hands, feet or stomach 4) if you have to sleep on extra pillows at night in order to breathe.   Complete by: As directed    Call MD for:  difficulty breathing, headache or visual disturbances   Complete by: As directed    Call MD for:  extreme fatigue   Complete by: As directed    Call MD for:  hives   Complete by: As directed    Call MD for:  persistant dizziness or light-headedness   Complete by: As directed    Call MD for:  persistant nausea and vomiting   Complete by: As directed    Call MD for:  severe uncontrolled pain   Complete by: As directed    Call MD for:  temperature >100.4   Complete by: As directed    Diet - low sodium heart healthy   Complete by: As directed    Discharge instructions   Complete by: As directed    Thank you for allowing us  to be part of your care. You were hospitalized for atrial flutter with rapid heart rate and heart failure exacerbation.  We treated you  with diuresis with the water pill Lasix  in the IV and rate control with the medication amiodarone  then transitioned you to metoprolol  once you went back into regular rate and rhythm.  See the changes in your medications and management of your chronic conditions below:  *For your heart failure, high blood pressure, and history of A-fib and a-flutter - You will take the following medications:       - Torsemide 40 mg twice a day (every 12 hours)       - Losartan 25 mg daily       - Metoprolol  XL 75 mg daily       - Jardiance 10 mg once daily       - Potassium 20 mEq daily (2 tablets of the 10 mEq by mouth daily)  HOLD spironolactone  until you see your primary care doctor.   DO NOT TAKE furosemide .  Take your weight daily, make sure to inform your doctor if your weight increases.  Take note of any additional swelling or shortness of breath.  Take note if you have any increased palpitations.  Continue to use your CPAP at home.  FOLLOW UP APPOINTMENTS: You have an appointment with the internal medicine residency clinic on 09/02/2024 at 8:45 AM  Please call your PCP or our clinic if you have any questions or concerns, we may be able to help and keep you from a long and expensive emergency room wait. Our clinic and after hours phone number is [phone]. The best time to call is Monday through Friday 9 am to 4 pm but there is always someone available 24/7 if you have an emergency. If you need medication refills please notify your pharmacy one week in advance and they will send us  a request.   We are glad you are feeling better,  Sallyanne Primas Internal Medicine Inpatient Teaching Service at [facility]   Increase activity slowly   Complete by: As directed        Signed: Primas Sallyanne, DO 08/22/2024, 2:41 PM

## 2024-08-22 NOTE — Discharge Instructions (Addendum)
 Thank you for allowing us  to be part of your care. You were hospitalized for atrial flutter with rapid heart rate and heart failure exacerbation.  We treated you with diuresis with the water pill Lasix  in the IV and rate control with the medication amiodarone  then transitioned you to metoprolol  once you went back into regular rate and rhythm.  See the changes in your medications and management of your chronic conditions below:  *For your heart failure, high blood pressure, and history of A-fib and a-flutter - You will take the following medications:       - Torsemide 40 mg twice a day (every 12 hours)       - Losartan 25 mg daily       - Metoprolol  XL 75 mg daily       - Jardiance 10 mg once daily       - Potassium 20 mEq daily (2 tablets of the 10 mEq by mouth daily)  HOLD spironolactone  until you see your primary care doctor.   DO NOT TAKE furosemide .   Take your weight daily, make sure to inform your doctor if your weight increases.  Take note of any additional swelling or shortness of breath.  Take note if you have any increased palpitations.  Continue to use your CPAP at home.  FOLLOW UP APPOINTMENTS: You have an appointment with the internal medicine residency clinic on 09/02/2024 at 8:45 AM  Please call your PCP or our clinic if you have any questions or concerns, we may be able to help and keep you from a long and expensive emergency room wait. Our clinic and after hours phone number is [phone]. The best time to call is Monday through Friday 9 am to 4 pm but there is always someone available 24/7 if you have an emergency. If you need medication refills please notify your pharmacy one week in advance and they will send us  a request.   We are glad you are feeling better,  Sallyanne Primas Internal Medicine Inpatient Teaching Service at [facility]

## 2024-08-22 NOTE — Plan of Care (Signed)

## 2024-08-22 NOTE — Progress Notes (Signed)
 Msg launched to office to help arrange outpatient TEE, needs pre-cert on outpatient end. In addition to already arranged TOC HF f/u 10/13, arranged gen cards f/u 10/27 and info placed on AVS.
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
## 2024-08-26 ENCOUNTER — Other Ambulatory Visit: Payer: Self-pay | Admitting: Physician Assistant

## 2024-08-26 DIAGNOSIS — I351 Nonrheumatic aortic (valve) insufficiency: Secondary | ICD-10-CM

## 2024-08-26 NOTE — Telephone Encounter (Signed)
 PAP: Patient assistance application for Eliquis  has been approved by PAP Companies: [employer] from 08/26/24 to 08/25/25. Medication should be delivered to PAP Delivery: Home. For further shipping updates, please contact [employer] at [phone]. Patient ID is: [id number]

## 2024-08-26 NOTE — Telephone Encounter (Signed)
 Orders placed under orders only encounter (would not let me place in phone note).

## 2024-08-26 NOTE — Telephone Encounter (Signed)
 TEE scheduled for 09/03/24 at 9:30 AM. Phone call to patient to review instructions, [facility] also sent.

## 2024-09-01 ENCOUNTER — Encounter (HOSPITAL_COMMUNITY): Payer: Self-pay

## 2024-09-02 ENCOUNTER — Ambulatory Visit: Payer: Self-pay | Admitting: Student

## 2024-09-02 ENCOUNTER — Encounter: Payer: Self-pay | Admitting: Student

## 2024-09-02 VITALS — BP 138/85 | HR 82 | Temp 97.4°F | Ht 68.0 in | Wt 272.4 lb

## 2024-09-02 DIAGNOSIS — R7989 Other specified abnormal findings of blood chemistry: Secondary | ICD-10-CM

## 2024-09-02 DIAGNOSIS — Z7984 Long term (current) use of oral hypoglycemic drugs: Secondary | ICD-10-CM

## 2024-09-02 DIAGNOSIS — I10 Essential (primary) hypertension: Secondary | ICD-10-CM

## 2024-09-02 DIAGNOSIS — Z5971 Insufficient health insurance coverage: Secondary | ICD-10-CM

## 2024-09-02 DIAGNOSIS — I130 Hypertensive heart and chronic kidney disease with heart failure and stage 1 through stage 4 chronic kidney disease, or unspecified chronic kidney disease: Secondary | ICD-10-CM

## 2024-09-02 DIAGNOSIS — N182 Chronic kidney disease, stage 2 (mild): Secondary | ICD-10-CM | POA: Diagnosis not present

## 2024-09-02 DIAGNOSIS — I484 Atypical atrial flutter: Secondary | ICD-10-CM

## 2024-09-02 DIAGNOSIS — Z7901 Long term (current) use of anticoagulants: Secondary | ICD-10-CM

## 2024-09-02 DIAGNOSIS — I5020 Unspecified systolic (congestive) heart failure: Secondary | ICD-10-CM

## 2024-09-02 MED ORDER — APIXABAN 5 MG PO TABS: 5.0000 mg | ORAL_TABLET | Freq: Two times a day (BID) | ORAL | 11 refills | Status: AC

## 2024-09-02 MED ORDER — EMPAGLIFLOZIN 10 MG PO TABS: 10.0000 mg | ORAL_TABLET | Freq: Every day | ORAL | 11 refills | Status: DC

## 2024-09-02 MED ORDER — METOPROLOL SUCCINATE ER 25 MG PO TB24: 75.0000 mg | ORAL_TABLET | Freq: Every day | ORAL | 11 refills | Status: AC

## 2024-09-02 MED ORDER — TORSEMIDE 20 MG PO TABS: 40.0000 mg | ORAL_TABLET | Freq: Two times a day (BID) | ORAL | 2 refills | Status: DC

## 2024-09-02 MED ORDER — LOSARTAN POTASSIUM 25 MG PO TABS: 25.0000 mg | ORAL_TABLET | Freq: Every day | ORAL | 11 refills | Status: DC

## 2024-09-02 NOTE — Assessment & Plan Note (Signed)
 Noted elevated creatinine during diuresis in hospital.  Discharge creatinine of 1.43.  Baseline appears 1.0-1.2.  On GDMT along with torsemide.  Holding home spironolactone  currently.  Denies any acute urinary symptoms.  Repeat BMP today.

## 2024-09-02 NOTE — Assessment & Plan Note (Signed)
 Recent hospitalization for acute on chronic CHF exacerbation.  Previously HFpEF but repeat echo showed decreased EF to 45-50%.  Cardiology followed while inpatient and has outpatient follow-up later this month.  Plan for TEE to evaluate for decreased EF.  GDMT includes: Losartan 25 mg, metoprolol  XL 75 mg, Jardiance 10 mg with torsemide 40 mg twice daily.  Weight appears stable at 272 lbs from discharge weight of 268-270 lbs.  Plan -Continue cardiology follow-up and planned TEE -Continue GDMT listed above along with torsemide and potassium supplementation, refills sent - Repeat BMP today, may consider adding back spironolactone  pending Scr level
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
## 2024-09-02 NOTE — Patient Instructions (Addendum)
 Thank you, Anthony Byrd for allowing us  to provide your care today. Today we discussed:  -Blood pressure better. Continue with losartan, metoprolol  and Jardiance  -Blood work today, I will call with results -Make sure to follow up with cardiology  -Monitor your weight at home, continue with torsemide -Provided financial assistance paperwork and will work on medication assistance    I have ordered the following medication/changed the following medications:  Start the following medications: Meds ordered this encounter  Medications   metoprolol  succinate (TOPROL -XL) 25 MG 24 hr tablet    Sig: Take 3 tablets (75 mg total) by mouth daily.    Dispense:  90 tablet    Refill:  11   empagliflozin (JARDIANCE) 10 MG TABS tablet    Sig: Take 1 tablet (10 mg total) by mouth daily.    Dispense:  30 tablet    Refill:  11   apixaban  (ELIQUIS ) 5 MG TABS tablet    Sig: Take 1 tablet (5 mg total) by mouth 2 (two) times daily.    Dispense:  60 tablet    Refill:  11    IM Program   losartan (COZAAR) 25 MG tablet    Sig: Take 1 tablet (25 mg total) by mouth daily.    Dispense:  30 tablet    Refill:  11   torsemide (DEMADEX) 20 MG tablet    Sig: Take 2 tablets (40 mg total) by mouth 2 (two) times daily.    Dispense:  120 tablet    Refill:  2     Follow up: 2-3 months   Should you have any questions or concerns please call the internal medicine clinic at [phone].    Garlan Drewes, D.O. [facility]

## 2024-09-02 NOTE — Assessment & Plan Note (Signed)
 Hospital follow-up admitted 9/28 to 10/3 for a flutter with RVR and acute on chronic heart failure exacerbation.  History of cardioversion and maze procedure in 2023.  Placed on amiodarone  infusion which was discontinued and transition back to home Eliquis  and adjusted metoprolol  XL 75 mg daily.  On discharge he was in NSR.  Exam today shows regular rate and rhythm.  Reports adherence to Eliquis  and metoprolol , refills sent for both.  Has cardiology follow-up later this month with planned TEE.

## 2024-09-03 ENCOUNTER — Ambulatory Visit: Payer: Self-pay | Admitting: Student

## 2024-09-03 LAB — BASIC METABOLIC PANEL WITH GFR
BUN/Creatinine Ratio: 17 (ref 9–20)
BUN: 21 mg/dL (ref 6–24)
CO2: 23 mmol/L (ref 20–29)
Calcium: 9.1 mg/dL (ref 8.7–10.2)
Chloride: 99 mmol/L (ref 96–106)
Creatinine, Ser: 1.25 mg/dL (ref 0.76–1.27)
Glucose: 151 mg/dL — ABNORMAL HIGH (ref 70–99)
Potassium: 4 mmol/L (ref 3.5–5.2)
Sodium: 139 mmol/L (ref 134–144)
eGFR: 68 mL/min/1.73 (ref >59)

## 2024-09-03 NOTE — Progress Notes (Signed)
 Internal Medicine Clinic Attending  Case discussed with the resident at the time of the visit.  We reviewed the resident's history and exam and pertinent patient test results.  I agree with the assessment, diagnosis, and plan of care documented in the resident's note.

## 2024-09-04 ENCOUNTER — Ambulatory Visit (HOSPITAL_COMMUNITY): Payer: Self-pay

## 2024-09-04 ENCOUNTER — Encounter (HOSPITAL_COMMUNITY)
Admission: RE | Disposition: A | Discharge status: Home / Self Care | Payer: Self-pay | Source: Home / Self Care | Attending: Cardiovascular Disease

## 2024-09-04 ENCOUNTER — Encounter (HOSPITAL_COMMUNITY): Payer: Self-pay | Admitting: Cardiovascular Disease

## 2024-09-04 ENCOUNTER — Other Ambulatory Visit: Payer: Self-pay

## 2024-09-04 ENCOUNTER — Ambulatory Visit (HOSPITAL_COMMUNITY)
Admission: RE | Admit: 2024-09-04 | Discharge: 2024-09-04 | Disposition: A | Discharge status: Home / Self Care | Payer: Self-pay | Attending: Cardiovascular Disease | Admitting: Cardiovascular Disease

## 2024-09-04 ENCOUNTER — Encounter (HOSPITAL_COMMUNITY): Payer: Self-pay

## 2024-09-04 DIAGNOSIS — I5020 Unspecified systolic (congestive) heart failure: Secondary | ICD-10-CM

## 2024-09-04 DIAGNOSIS — I351 Nonrheumatic aortic (valve) insufficiency: Secondary | ICD-10-CM

## 2024-09-04 DIAGNOSIS — I4819 Other persistent atrial fibrillation: Secondary | ICD-10-CM | POA: Insufficient documentation

## 2024-09-04 DIAGNOSIS — Z6841 Body Mass Index (BMI) 40.0 and over, adult: Secondary | ICD-10-CM | POA: Insufficient documentation

## 2024-09-04 DIAGNOSIS — E66813 Obesity, class 3: Secondary | ICD-10-CM | POA: Insufficient documentation

## 2024-09-04 DIAGNOSIS — I340 Nonrheumatic mitral (valve) insufficiency: Secondary | ICD-10-CM

## 2024-09-04 DIAGNOSIS — I5042 Chronic combined systolic (congestive) and diastolic (congestive) heart failure: Secondary | ICD-10-CM | POA: Insufficient documentation

## 2024-09-04 DIAGNOSIS — G4733 Obstructive sleep apnea (adult) (pediatric): Secondary | ICD-10-CM | POA: Insufficient documentation

## 2024-09-04 DIAGNOSIS — N182 Chronic kidney disease, stage 2 (mild): Secondary | ICD-10-CM

## 2024-09-04 DIAGNOSIS — Z59868 Other specified financial insecurity: Secondary | ICD-10-CM | POA: Insufficient documentation

## 2024-09-04 DIAGNOSIS — I110 Hypertensive heart disease with heart failure: Secondary | ICD-10-CM | POA: Insufficient documentation

## 2024-09-04 DIAGNOSIS — I130 Hypertensive heart and chronic kidney disease with heart failure and stage 1 through stage 4 chronic kidney disease, or unspecified chronic kidney disease: Secondary | ICD-10-CM

## 2024-09-04 DIAGNOSIS — I083 Combined rheumatic disorders of mitral, aortic and tricuspid valves: Secondary | ICD-10-CM | POA: Insufficient documentation

## 2024-09-04 DIAGNOSIS — Z952 Presence of prosthetic heart valve: Secondary | ICD-10-CM | POA: Insufficient documentation

## 2024-09-04 DIAGNOSIS — K449 Diaphragmatic hernia without obstruction or gangrene: Secondary | ICD-10-CM | POA: Insufficient documentation

## 2024-09-04 HISTORY — PX: TRANSESOPHAGEAL ECHOCARDIOGRAM (CATH LAB): EP1270

## 2024-09-04 LAB — ECHO TEE

## 2024-09-04 SURGERY — TRANSESOPHAGEAL ECHOCARDIOGRAM (TEE) (CATHLAB)
Anesthesia: Monitor Anesthesia Care

## 2024-09-04 MED ORDER — PROPOFOL 500 MG/50ML IV EMUL
INTRAVENOUS | Status: DC | PRN
  Administered 2024-09-04: 100 ug/kg/min via INTRAVENOUS

## 2024-09-04 MED ORDER — LIDOCAINE 2% (20 MG/ML) 5 ML SYRINGE
INTRAMUSCULAR | Status: DC | PRN
  Administered 2024-09-04: 100 mg via INTRAVENOUS

## 2024-09-04 MED ORDER — SODIUM CHLORIDE 0.9 % IV SOLN: INTRAVENOUS | Status: DC

## 2024-09-04 NOTE — CV Procedure (Signed)
    TRANSESOPHAGEAL ECHOCARDIOGRAM   NAME:  Anthony Byrd    MRN: [id number] DOB:  20-Oct-1971    ADMIT DATE: 09/04/2024  INDICATIONS: Aortic regurgitation   PROCEDURE:   Informed consent was obtained prior to the procedure. The risks, benefits and alternatives for the procedure were discussed and the patient comprehended these risks.  Risks include, but are not limited to, cough, sore throat, vomiting, nausea, somnolence, esophageal and stomach trauma or perforation, bleeding, low blood pressure, aspiration, pneumonia, infection, trauma to the teeth and death.    Procedural time out performed. The oropharynx was anesthetized with topical 1% benzocaine .    Anesthesia was administered by Dr. Erma.   The patient's heart rate, blood pressure, and oxygen  saturation are monitored continuously during the procedure. The period of conscious sedation is 19 minutes, of which I was present face-to-face 100% of this time.   The transesophageal probe was inserted in the esophagus and stomach without difficulty and multiple views were obtained.   COMPLICATIONS:    There were no immediate complications.  KEY FINDINGS:  S/p MV repair. Severe residual MR due to restricted PMVL.  Calcified aortic valve leaflets with at least moderate AI. Hiatal hernia noted with limited TG views.  LVEF 60-65%. Moderate RV dilation with mildly reduced function.  Full report to follow. Further management per primary team.   Signed, Darryle DASEN. Barbaraann, MD, [facility]  [facility]  [address] [place], [place] [address] [phone]  1:15 PM

## 2024-09-04 NOTE — Anesthesia Preprocedure Evaluation (Signed)
 Anesthesia Evaluation  Patient identified by MRN, date of birth, ID band Patient awake    Reviewed: Allergy & Precautions, NPO status , Patient's Chart, lab work & pertinent test results  History of Anesthesia Complications Negative for: history of anesthetic complications  Airway Mallampati: III       Dental no notable dental hx.    Pulmonary sleep apnea and Continuous Positive Airway Pressure Ventilation    Pulmonary exam normal        Cardiovascular hypertension, Pt. on medications and Pt. on home beta blockers (-) angina +CHF  (-) Past MI + dysrhythmias Atrial Fibrillation  Rhythm:Regular Rate:Normal  07/2024: IMPRESSIONS     1. Respirophasic septal motion. Can be seen in PH/RV failure, obesity.  Left ventricular ejection fraction, by estimation, is 45 to 50%. The left  ventricle has mildly decreased function. The left ventricle demonstrates  global hypokinesis. Left  ventricular diastolic function could not be evaluated. Elevated left  atrial pressure.   2. Right ventricular systolic function is moderately reduced. The right  ventricular size is normal. There is mildly elevated pulmonary artery  systolic pressure. The estimated right ventricular systolic pressure is  39.2 mmHg.   3. Left atrial size was moderately dilated.   4. No significant MS, mitral gradient driven primarily by heart rate. The  mitral valve has been repaired/replaced. Mild mitral valve regurgitation.  No evidence of mitral stenosis. The mean mitral valve gradient is 7.0  mmHg. There is a prosthetic  annuloplasty ring present in the mitral position. Procedure Date: 2023.   5. The tricuspid valve is degenerative. Tricuspid valve regurgitation is  mild to moderate.   6. The aortic valve has an indeterminant number of cusps. There is  moderate calcification of the aortic valve. There is moderate thickening  of the aortic valve. Aortic valve  regurgitation is moderate to severe.  Mild to moderate aortic valve stenosis.  Aortic valve area, by VTI measures 1.32 cm. Aortic valve mean gradient  measures 18.0 mmHg. Aortic valve Vmax measures 2.67 m/s.   7. The inferior vena cava is dilated in size with <50% respiratory  variability, suggesting right atrial pressure of 15 mmHg.     Neuro/Psych neg Seizures  negative psych ROS   GI/Hepatic ,neg GERD  ,,  Endo/Other    Class 3 obesity  Renal/GU      Musculoskeletal   Abdominal  (+) + obese  Peds  Hematology eliquis    Anesthesia Other Findings   Reproductive/Obstetrics                              Anesthesia Physical Anesthesia Plan  ASA: 3  Anesthesia Plan: MAC   Post-op Pain Management:    Induction: Intravenous  PONV Risk Score and Plan: 1 and Propofol  infusion and Treatment may vary due to age or medical condition  Airway Management Planned: Natural Airway and Simple Face Mask  Additional Equipment: None  Intra-op Plan:   Post-operative Plan:   Informed Consent: I have reviewed the patients History and Physical, chart, labs and discussed the procedure including the risks, benefits and alternatives for the proposed anesthesia with the patient or authorized representative who has indicated his/her understanding and acceptance.     Dental advisory given  Plan Discussed with: CRNA  Anesthesia Plan Comments:          Anesthesia Quick Evaluation

## 2024-09-04 NOTE — Transfer of Care (Signed)
 Immediate Anesthesia Transfer of Care Note  Patient: Anthony Byrd  Procedure(s) Performed: TRANSESOPHAGEAL ECHOCARDIOGRAM  Patient Location: PACU  Anesthesia Type:MAC  Level of Consciousness: drowsy  Airway & Oxygen  Therapy: Patient Spontanous Breathing and Patient connected to nasal cannula oxygen   Post-op Assessment: Report given to RN and Post -op Vital signs reviewed and stable  Post vital signs: Reviewed and stable  Last Vitals:  Vitals Value Taken Time  BP 149/98 09/04/24 13:15  Temp 36.4 C 09/04/24 13:15  Pulse 76 09/04/24 13:18  Resp 14 09/04/24 13:18  SpO2 97 % 09/04/24 13:18  Vitals shown include unfiled device data.  Last Pain:  Vitals:   09/04/24 1315  TempSrc: Tympanic  PainSc: Asleep         Complications: There were no known notable events for this encounter.

## 2024-09-04 NOTE — Anesthesia Postprocedure Evaluation (Signed)
 Anesthesia Post Note  Patient: Anthony Byrd  Procedure(s) Performed: TRANSESOPHAGEAL ECHOCARDIOGRAM     Patient location during evaluation: PACU Anesthesia Type: MAC Level of consciousness: awake and alert Pain management: pain level controlled Vital Signs Assessment: post-procedure vital signs reviewed and stable Respiratory status: spontaneous breathing, nonlabored ventilation, respiratory function stable and patient connected to nasal cannula oxygen  Cardiovascular status: stable and blood pressure returned to baseline Postop Assessment: no apparent nausea or vomiting Anesthetic complications: no   There were no known notable events for this encounter.  Last Vitals:  Vitals:   09/04/24 1325 09/04/24 1335  BP: 121/66 126/70  Pulse: 78 78  Resp: 13 17  Temp:    SpO2: 97% 97%    Last Pain:  Vitals:   09/04/24 1335  TempSrc:   PainSc: 0-No pain                 Thom JONELLE Peoples
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
## 2024-09-04 NOTE — Discharge Instructions (Signed)

## 2024-09-08 ENCOUNTER — Encounter (HOSPITAL_COMMUNITY): Payer: Self-pay

## 2024-09-09 ENCOUNTER — Telehealth: Payer: Self-pay

## 2024-09-09 NOTE — Progress Notes (Signed)
 Pharmacy Medication Assistance Program Note    09/10/2024  Patient ID: Anthony Byrd, male   DOB: [birth year], 53 y.o.   MRN: [id number]     09/09/2024  [employer]  [employer]  [employer] [facility] Jardiance  Type of Radiographer, therapeutic Assistance  Date Application Sent to Patient 09/10/2024  Application Items Requested Application;Proof of Income    New - mailed to pt's home

## 2024-09-09 NOTE — Telephone Encounter (Signed)
-----   Message from Ozell Nearing sent at 09/02/2024  5:39 PM EDT ----- Anthony Byrd,   Patient is currently uninsured.  Provided financial assistance paperwork at visit today.  Can we see about assistance with his medications that he was discharged with from the hospital such as Jardiance?  He states he has financial assistance with the Eliquis  already. Thank you!
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
## 2024-09-15 ENCOUNTER — Ambulatory Visit (HOSPITAL_COMMUNITY): Payer: Self-pay

## 2024-09-15 ENCOUNTER — Ambulatory Visit: Payer: MEDICAID | Attending: Physician Assistant | Admitting: Physician Assistant
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
## 2024-09-16 ENCOUNTER — Encounter: Payer: Self-pay | Admitting: Physician Assistant

## 2024-09-22 ENCOUNTER — Encounter: Payer: Self-pay | Admitting: Radiology

## 2024-10-28 ENCOUNTER — Encounter: Payer: Self-pay | Admitting: Cardiovascular Disease

## 2024-10-30 ENCOUNTER — Other Ambulatory Visit: Payer: Self-pay | Admitting: Cardiovascular Disease
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
## 2024-11-27 ENCOUNTER — Ambulatory Visit: Payer: Self-pay

## 2024-12-05 NOTE — H&P (View-Only) (Signed)
 " Cardiology Office Note:  .   Date:  12/08/2024  ID:  Anthony Byrd, DOB [birth year], MRN [id number] PCP: Elicia Sharper, DO  [facility] Providers Cardiologist:  Darryle ONEIDA Decent, MD   History of Present Illness: .    Chief Complaint  Patient presents with   Follow-up         Anthony Byrd is a 54 y.o. male with below history who presents for follow-up.   History of Present Illness   Anthony Byrd is a 54 year old male with severe mitral valve regurgitation with failed MV repair, CHF, obesity, HTN and aortic valve regurgitation who presents for follow-up after mitral valve repair.  He underwent mitral valve repair in 2023, but the repair has failed. In October 2025, he was admitted for decompensated heart failure and atrial flutter, which resolved during hospitalization. He experiences shortness of breath with exertion, such as climbing stairs, and notes occasional leg swelling. No significant fluid retention is reported.  He has a history of moderate aortic valve regurgitation and persistent atrial fibrillation, status post maze procedure. His current medications include metoprolol  succinate 75 mg daily, spironolactone  25 mg daily, losartan  25 mg daily, torsemide  40 mg twice daily, Jardiance  10 mg daily, and Eliquis .  He acknowledges the need to improve his eating habits, particularly reducing salt intake, but finds it challenging, especially when traveling for work.           Problem List Severe MR -s/p MV repair/MAZE @ [facility] 12/12/2021 -normal coronaries  - severe MR 08/2024 2. Aortic regurgitation  -moderate 08/2024 3. Persistent atrial fibrillation -s/p MAZE 12/12/2021 -Atypical AFL TEE/DCCV 12/27/2021 -CHADSVASC=2 (HTN, CHF) 4. HFpEF -EF 45-50% 07/2024 -EF 65-70% 09/04/2024 5. HTN 6. OSA    ROS: All other ROS reviewed and negative. Pertinent positives noted in the HPI.     Studies Reviewed: SABRA   EKG Interpretation Date/Time:  Monday December 08 2024  10:35:53 EST Ventricular Rate:  76 PR Interval:  184 QRS Duration:  100 QT Interval:  398 QTC Calculation: 447 R Axis:   33  Text Interpretation: Normal sinus rhythm Nonspecific T wave abnormality Confirmed by Decent Darryle [phone]) on 12/08/2024 10:37:46 AM   TEE 09/04/2024  1. Calcified tricuspid aortic valve with restricted leaflet motion.  Suspect at least moderate aortic stenosis, but unable to obtain gradients  due to lack of TG views due to hiatal hernia. Aortic regurgitation appears  at least moderate on limited  evaluation, but there is concerns for holodiastolic flow reversal in the  descending aorta. Would consider cardiac MRI for clarification on AI  severity. The aortic valve is tricuspid. There is moderate calcification  of the aortic valve. There is moderate  thickening of the aortic valve. Aortic valve regurgitation is moderate.   2. S/p MV annuloplasty. Moderate to severe MR is present. There is  systolic flow reversal in the left pulmonary veins. The mechanism for MR  is related to severely restricted leaflet movement of the PMVL in systole.  The anterior leaflet appears calcified   as well. MG 7.0 mmHG @ 79 bpm. Suspect there is at least mild mitral  stenosis based on native leaflet appearance. The mitral valve has been  repaired/replaced. Moderate to severe mitral valve regurgitation. Mild  mitral stenosis. There is a prosthetic  annuloplasty ring present in the mitral position. Procedure Date: 2023.   3. Left ventricular ejection fraction, by estimation, is 65 to 70%. The  left ventricle has normal function.  4. Right ventricular systolic function is mildly reduced. The right  ventricular size is moderately enlarged.   5. S/p LAA surgical closure without residual connection. Left atrial size  was mild to moderately dilated. No left atrial/left atrial appendage  thrombus was detected.   6. Right atrial size was mildly dilated.   7. Agitated saline contrast  bubble study was negative, with no evidence  of any interatrial shunt.   8. 3D performed of the mitral valve and 3D performed of the aortic valve.   Physical Exam:   VS:  BP (!) 136/90   Pulse 78   Ht 5' 8 (1.727 m)   Wt 289 lb 4.8 oz (131.2 kg)   SpO2 99%   BMI 43.99 kg/m    Wt Readings from Last 3 Encounters:  12/08/24 289 lb 4.8 oz (131.2 kg)  09/04/24 275 lb (124.7 kg)  09/02/24 272 lb 6.4 oz (123.6 kg)    GEN: Well nourished, well developed in no acute distress NECK: No JVD; No carotid bruits CARDIAC: RRR, 3/6 HSM, rubs, gallops RESPIRATORY:  Clear to auscultation without rales, wheezing or rhonchi  ABDOMEN: Soft, non-tender, non-distended EXTREMITIES:  2+ LE edema  ASSESSMENT AND PLAN: .   Assessment and Plan    Severe mitral regurgitation, failed mitral valve repair Severe mitral valve regurgitation with failed repair from 2023. Requires mitral valve replacement. Discussed options between mechanical and tissue valves. Mechanical valves require lifelong Coumadin  therapy with regular INR monitoring, while tissue valves do not require Coumadin  but may wear down over time. He is considering options and will discuss further with Dr. Daniel. - Referred to Dr. Daniel for mitral valve replacement surgery. - Set up preoperative left heart catheterization next Monday.  - Ordered repeat trans-thoracic echocardiogram in 1-2 weeks.  Moderate aortic regurgitation Requires aortic valve replacement as part of the surgical plan. - Included aortic valve replacement in surgical plan with Dr. Beverley.  Chronic diastolic heart failure with volume overload Chronic diastolic heart failure with volume overload, evidenced by 2+ pitting edema and shortness of breath with activity. Current EF is 45-50%. Not adhering to a low salt diet, contributing to fluid retention. - Switched losartan  to Entresto  24/26 mg BID. - Increased torsemide  to 80 mg BID for 3 days, then resume 40 mg BID. - Increased potassium to  20 mEq BID for 3 days, then resume 20 mEq daily. - Ordered full panel of labs including BMET, CBC, lipids, and TSH. - Advised on low salt diet and weight management. - Continue metoprolol , jardiance  10 mg daily, aldactone  25 daily.   Persistent atrial fibrillation, status post maze Persistent atrial fibrillation, status post maze procedure. Currently on Eliquis  for anticoagulation. - Continue Eliquis .  Primary hypertension - As above   Obesity BMI 44 -weight loss advised.            Informed Consent   Shared Decision Making/Informed Consent The risks [stroke (1 in 1000), death (1 in 1000), kidney failure [usually temporary] (1 in 500), bleeding (1 in 200), allergic reaction [possibly serious] (1 in 200)], benefits (diagnostic support and management of coronary artery disease) and alternatives of a cardiac catheterization were discussed in detail with Mr. Shere and he is willing to proceed.      Follow-up: Return in about 2 weeks (around 12/22/2024).  Signed, Darryle DASEN. Barbaraann, MD, [facility]  [facility]  [address] [place], [place] [address] [phone]  11:39 AM   "

## 2024-12-05 NOTE — Progress Notes (Unsigned)
 " Cardiology Office Note:  .   Date:  12/08/2024  ID:  ALIX STOWERS, DOB December 24, 1970, MRN [id number] PCP: Elicia Sharper, DO  [facility] Providers Cardiologist:  Darryle ONEIDA Decent, MD   History of Present Illness: .    Chief Complaint  Patient presents with   Follow-up         CHIRSTOPHER IOVINO is a 54 y.o. male with below history who presents for follow-up.   History of Present Illness   DARTANIAN KNAGGS is a 54 year old male with severe mitral valve regurgitation with failed MV repair, CHF, obesity, HTN and aortic valve regurgitation who presents for follow-up after mitral valve repair.  He underwent mitral valve repair in 2023, but the repair has failed. In October 2025, he was admitted for decompensated heart failure and atrial flutter, which resolved during hospitalization. He experiences shortness of breath with exertion, such as climbing stairs, and notes occasional leg swelling. No significant fluid retention is reported.  He has a history of moderate aortic valve regurgitation and persistent atrial fibrillation, status post maze procedure. His current medications include metoprolol  succinate 75 mg daily, spironolactone  25 mg daily, losartan  25 mg daily, torsemide  40 mg twice daily, Jardiance  10 mg daily, and Eliquis .  He acknowledges the need to improve his eating habits, particularly reducing salt intake, but finds it challenging, especially when traveling for work.           Problem List Severe MR -s/p MV repair/MAZE @ [facility] 12/12/2021 -normal coronaries  - severe MR 08/2024 2. Aortic regurgitation  -moderate 08/2024 3. Persistent atrial fibrillation -s/p MAZE 12/12/2021 -Atypical AFL TEE/DCCV 12/27/2021 -CHADSVASC=2 (HTN, CHF) 4. HFpEF -EF 45-50% 07/2024 -EF 65-70% 09/04/2024 5. HTN 6. OSA    ROS: All other ROS reviewed and negative. Pertinent positives noted in the HPI.     Studies Reviewed: SABRA   EKG Interpretation Date/Time:  Monday December 08 2024  10:35:53 EST Ventricular Rate:  76 PR Interval:  184 QRS Duration:  100 QT Interval:  398 QTC Calculation: 447 R Axis:   33  Text Interpretation: Normal sinus rhythm Nonspecific T wave abnormality Confirmed by Decent Darryle [phone]) on 12/08/2024 10:37:46 AM   TEE 09/04/2024  1. Calcified tricuspid aortic valve with restricted leaflet motion.  Suspect at least moderate aortic stenosis, but unable to obtain gradients  due to lack of TG views due to hiatal hernia. Aortic regurgitation appears  at least moderate on limited  evaluation, but there is concerns for holodiastolic flow reversal in the  descending aorta. Would consider cardiac MRI for clarification on AI  severity. The aortic valve is tricuspid. There is moderate calcification  of the aortic valve. There is moderate  thickening of the aortic valve. Aortic valve regurgitation is moderate.   2. S/p MV annuloplasty. Moderate to severe MR is present. There is  systolic flow reversal in the left pulmonary veins. The mechanism for MR  is related to severely restricted leaflet movement of the PMVL in systole.  The anterior leaflet appears calcified   as well. MG 7.0 mmHG @ 79 bpm. Suspect there is at least mild mitral  stenosis based on native leaflet appearance. The mitral valve has been  repaired/replaced. Moderate to severe mitral valve regurgitation. Mild  mitral stenosis. There is a prosthetic  annuloplasty ring present in the mitral position. Procedure Date: 2023.   3. Left ventricular ejection fraction, by estimation, is 65 to 70%. The  left ventricle has normal function.  4. Right ventricular systolic function is mildly reduced. The right  ventricular size is moderately enlarged.   5. S/p LAA surgical closure without residual connection. Left atrial size  was mild to moderately dilated. No left atrial/left atrial appendage  thrombus was detected.   6. Right atrial size was mildly dilated.   7. Agitated saline contrast  bubble study was negative, with no evidence  of any interatrial shunt.   8. 3D performed of the mitral valve and 3D performed of the aortic valve.   Physical Exam:   VS:  BP (!) 136/90   Pulse 78   Ht 5' 8 (1.727 m)   Wt 289 lb 4.8 oz (131.2 kg)   SpO2 99%   BMI 43.99 kg/m    Wt Readings from Last 3 Encounters:  12/08/24 289 lb 4.8 oz (131.2 kg)  09/04/24 275 lb (124.7 kg)  09/02/24 272 lb 6.4 oz (123.6 kg)    GEN: Well nourished, well developed in no acute distress NECK: No JVD; No carotid bruits CARDIAC: RRR, 3/6 HSM, rubs, gallops RESPIRATORY:  Clear to auscultation without rales, wheezing or rhonchi  ABDOMEN: Soft, non-tender, non-distended EXTREMITIES:  2+ LE edema  ASSESSMENT AND PLAN: .   Assessment and Plan    Severe mitral regurgitation, failed mitral valve repair Severe mitral valve regurgitation with failed repair from 2023. Requires mitral valve replacement. Discussed options between mechanical and tissue valves. Mechanical valves require lifelong Coumadin  therapy with regular INR monitoring, while tissue valves do not require Coumadin  but may wear down over time. He is considering options and will discuss further with Dr. Daniel. - Referred to Dr. Daniel for mitral valve replacement surgery. - Set up preoperative left heart catheterization next Monday.  - Ordered repeat trans-thoracic echocardiogram in 1-2 weeks.  Moderate aortic regurgitation Requires aortic valve replacement as part of the surgical plan. - Included aortic valve replacement in surgical plan with Dr. Beverley.  Chronic diastolic heart failure with volume overload Chronic diastolic heart failure with volume overload, evidenced by 2+ pitting edema and shortness of breath with activity. Current EF is 45-50%. Not adhering to a low salt diet, contributing to fluid retention. - Switched losartan  to Entresto  24/26 mg BID. - Increased torsemide  to 80 mg BID for 3 days, then resume 40 mg BID. - Increased potassium to  20 mEq BID for 3 days, then resume 20 mEq daily. - Ordered full panel of labs including BMET, CBC, lipids, and TSH. - Advised on low salt diet and weight management. - Continue metoprolol , jardiance  10 mg daily, aldactone  25 daily.   Persistent atrial fibrillation, status post maze Persistent atrial fibrillation, status post maze procedure. Currently on Eliquis  for anticoagulation. - Continue Eliquis .  Primary hypertension - As above   Obesity BMI 44 -weight loss advised.            Informed Consent   Shared Decision Making/Informed Consent The risks [stroke (1 in 1000), death (1 in 1000), kidney failure [usually temporary] (1 in 500), bleeding (1 in 200), allergic reaction [possibly serious] (1 in 200)], benefits (diagnostic support and management of coronary artery disease) and alternatives of a cardiac catheterization were discussed in detail with Mr. Rochford and he is willing to proceed.      Follow-up: Return in about 2 weeks (around 12/22/2024).  Signed, Darryle DASEN. Barbaraann, MD, [facility]  [facility]  [address] [place], [place] [address] [phone]  11:39 AM   "

## 2024-12-08 ENCOUNTER — Encounter (HOSPITAL_BASED_OUTPATIENT_CLINIC_OR_DEPARTMENT_OTHER): Payer: Self-pay | Admitting: Cardiovascular Disease

## 2024-12-08 ENCOUNTER — Other Ambulatory Visit (HOSPITAL_BASED_OUTPATIENT_CLINIC_OR_DEPARTMENT_OTHER): Payer: Self-pay

## 2024-12-08 ENCOUNTER — Ambulatory Visit (HOSPITAL_BASED_OUTPATIENT_CLINIC_OR_DEPARTMENT_OTHER): Admitting: Cardiovascular Disease

## 2024-12-08 ENCOUNTER — Encounter (HOSPITAL_BASED_OUTPATIENT_CLINIC_OR_DEPARTMENT_OTHER): Payer: Self-pay

## 2024-12-08 VITALS — BP 136/90 | HR 78 | Ht 68.0 in | Wt 289.3 lb

## 2024-12-08 DIAGNOSIS — I5032 Chronic diastolic (congestive) heart failure: Secondary | ICD-10-CM

## 2024-12-08 DIAGNOSIS — I4819 Other persistent atrial fibrillation: Secondary | ICD-10-CM

## 2024-12-08 DIAGNOSIS — I10 Essential (primary) hypertension: Secondary | ICD-10-CM | POA: Diagnosis not present

## 2024-12-08 DIAGNOSIS — Z01812 Encounter for preprocedural laboratory examination: Secondary | ICD-10-CM

## 2024-12-08 DIAGNOSIS — I340 Nonrheumatic mitral (valve) insufficiency: Secondary | ICD-10-CM

## 2024-12-08 DIAGNOSIS — I351 Nonrheumatic aortic (valve) insufficiency: Secondary | ICD-10-CM

## 2024-12-08 MED ORDER — SACUBITRIL-VALSARTAN 24-26 MG PO TABS
1.0000 | ORAL_TABLET | Freq: Two times a day (BID) | ORAL | 5 refills | Status: AC
  Filled 2024-12-08 – 2024-12-12 (×4): qty 60, 30d supply, fill #0

## 2024-12-08 NOTE — Patient Instructions (Signed)
 Medication Instructions:  Your physician has recommended you make the following change in your medication:  1.) stop losartan  2.) start Entresto  24-26 mg - one tablet twice a day 3.) for 3 days - increase torsemide  to 80 mg twice a day, then return to usual dose 4.) for 3 days - increase potassium chloride  to 20 meq twice daily, then return to usual dose  *If you need a refill on your cardiac medications before your next appointment, please call your pharmacy*  Lab Work: Today: tsh, lipids, cbc, bmet  If you have labs (blood work) drawn today and your tests are completely normal, you will receive your results only by: MyChart Message (if you have MyChart) OR A paper copy in the mail If you have any lab test that is abnormal or we need to change your treatment, we will call you to review the results.  Testing/Procedures: WORK IN ECHO IN 1 WEEK Your physician has requested that you have an echocardiogram. Echocardiography is a painless test that uses sound waves to create images of your heart. It provides your doctor with information about the size and shape of your heart and how well your hearts chambers and valves are working. This procedure takes approximately one hour. There are no restrictions for this procedure. Please do NOT wear cologne, perfume, aftershave, or lotions (deodorant is allowed). Please arrive 15 minutes prior to your appointment time.  Please note: We ask at that you not bring children with you during ultrasound (echo/ vascular) testing. Due to room size and safety concerns, children are not allowed in the ultrasound rooms during exams. Our front office staff cannot provide observation of children in our lobby area while testing is being conducted. An adult accompanying a patient to their appointment will only be allowed in the ultrasound room at the discretion of the ultrasound technician under special circumstances. We apologize for any inconvenience.  Your physician  has requested that you have a cardiac catheterization. Cardiac catheterization is used to diagnose and/or treat various heart conditions. Doctors may recommend this procedure for a number of different reasons. The most common reason is to evaluate chest pain. Chest pain can be a symptom of coronary artery disease (CAD), and cardiac catheterization can show whether plaque is narrowing or blocking your hearts arteries. This procedure is also used to evaluate the valves, as well as measure the blood flow and oxygen  levels in different parts of your heart. For further information please visit [email or web address] Please follow instruction sheet, as given.   Follow-Up: At [facility], you and your health needs are our priority.  As part of our continuing mission to provide you with exceptional heart care, our providers are all part of one team.  This team includes your primary Cardiologist (physician) and Advanced Practice Providers or APPs (Physician Assistants and Nurse Practitioners) who all work together to provide you with the care you need, when you need it.  Your next appointment:   2 week(s)  Provider:   Darryle Decent, MD  Other Instructions  [facility] & VASCULAR AT [facility] [address] [place] [address] Dept: [phone]  Anthony Byrd  12/08/2024  You are scheduled for a Cardiac Catheterization on Monday, January 26 with Dr. Lonni End.  1. Please arrive at the [facility] (Main Entrance A) at [facility]: [address] [place], [place] [address] at 5:30 AM (This time is TWO hour(s) before your procedure to ensure  your preparation).   Free valet parking service is available. You will check in at ADMITTING. The support person will be asked to wait in the waiting room.  It is OK to have someone drop you off and come back when you are ready to be discharged.    Special note: Every  effort is made to have your procedure done on time. Please understand that emergencies sometimes delay scheduled procedures.  2. Diet: Nothing to eat after midnight.  3. Hydration: You need to be well hydrated before your procedure. On January 26, you may drink approved liquids (see below) until 2 hours before the procedure, with 16 oz of water as your last intake.   List of approved liquids water, clear juice, clear tea, black coffee, fruit juices, non-citric and without pulp, carbonated beverages, Gatorade, Kool -Aid, plain Jello-O and plain ice popsicles.  4. Labs: You will need to have blood drawn today.  5. Medication instructions in preparation for your procedure:   Contrast Allergy: No  No Eliquis  after Friday Jan 23 evening dose.   Do not take torsemide , potassium or Jardiance  morning of procedure  On the morning of your procedure, take your Aspirin  81 mg and any morning medicines NOT listed above.  You may use sips of water.  6. Plan to go home the same day, you will only stay overnight if medically necessary. 7. Bring a current list of your medications and current insurance cards. 8. You MUST have a responsible person to drive you home. 9. Someone MUST be with you the first 24 hours after you arrive home or your discharge will be delayed. 10. Please wear clothes that are easy to get on and off and wear slip-on shoes.  Thank you for allowing us  to care for you!   -- [facility]  You have been referred to [facility] - Dr. Con Clunes

## 2024-12-09 ENCOUNTER — Ambulatory Visit: Payer: Self-pay | Admitting: Cardiovascular Disease

## 2024-12-09 ENCOUNTER — Telehealth (HOSPITAL_BASED_OUTPATIENT_CLINIC_OR_DEPARTMENT_OTHER): Payer: Self-pay | Admitting: Cardiovascular Disease

## 2024-12-09 ENCOUNTER — Ambulatory Visit (HOSPITAL_COMMUNITY)
Admission: RE | Admit: 2024-12-09 | Discharge: 2024-12-09 | Disposition: A | Discharge status: Home / Self Care | Source: Ambulatory Visit | Attending: Cardiovascular Disease | Admitting: Cardiovascular Disease

## 2024-12-09 DIAGNOSIS — I340 Nonrheumatic mitral (valve) insufficiency: Secondary | ICD-10-CM | POA: Insufficient documentation

## 2024-12-09 DIAGNOSIS — I5032 Chronic diastolic (congestive) heart failure: Secondary | ICD-10-CM | POA: Insufficient documentation

## 2024-12-09 DIAGNOSIS — I351 Nonrheumatic aortic (valve) insufficiency: Secondary | ICD-10-CM | POA: Diagnosis present

## 2024-12-09 LAB — LIPID PANEL
Chol/HDL Ratio: 5.4 ratio — ABNORMAL HIGH (ref 0.0–5.0)
Cholesterol, Total: 222 mg/dL — ABNORMAL HIGH (ref 100–199)
HDL: 41 mg/dL
LDL Chol Calc (NIH): 150 mg/dL — ABNORMAL HIGH (ref 0–99)
Triglycerides: 169 mg/dL — ABNORMAL HIGH (ref 0–149)
VLDL Cholesterol Cal: 31 mg/dL (ref 5–40)

## 2024-12-09 LAB — CBC
Hematocrit: 48.2 % (ref 37.5–51.0)
Hemoglobin: 15.5 g/dL (ref 13.0–17.7)
MCH: 28 pg (ref 26.6–33.0)
MCHC: 32.2 g/dL (ref 31.5–35.7)
MCV: 87 fL (ref 79–97)
Platelets: 251 x10E3/uL (ref 150–450)
RBC: 5.53 x10E6/uL (ref 4.14–5.80)
RDW: 14.6 % (ref 11.6–15.4)
WBC: 7.1 x10E3/uL (ref 3.4–10.8)

## 2024-12-09 LAB — BASIC METABOLIC PANEL WITH GFR
BUN/Creatinine Ratio: 16 (ref 9–20)
BUN: 18 mg/dL (ref 6–24)
CO2: 25 mmol/L (ref 20–29)
Calcium: 9 mg/dL (ref 8.7–10.2)
Chloride: 98 mmol/L (ref 96–106)
Creatinine, Ser: 1.15 mg/dL (ref 0.76–1.27)
Glucose: 123 mg/dL — ABNORMAL HIGH (ref 70–99)
Potassium: 3.8 mmol/L (ref 3.5–5.2)
Sodium: 139 mmol/L (ref 134–144)
eGFR: 75 mL/min/1.73

## 2024-12-09 LAB — TSH: TSH: 0.635 u[IU]/mL (ref 0.450–4.500)

## 2024-12-09 NOTE — Telephone Encounter (Signed)
 Pt advised he went to pharmacy downstairs and that they advised they no longer have samples of entresto . Please call patient and advise.

## 2024-12-09 NOTE — Telephone Encounter (Signed)
 Left message for patient to call back

## 2024-12-10 LAB — ECHOCARDIOGRAM COMPLETE
AR max vel: 1.7 cm2
AV Area VTI: 1.96 cm2
AV Area mean vel: 1.81 cm2
AV Mean grad: 22 mmHg
AV Peak grad: 44.4 mmHg
Ao pk vel: 3.33 m/s
Area-P 1/2: 1.71 cm2
MV VTI: 1.62 cm2
P 1/2 time: 422 ms
S' Lateral: 2.9 cm

## 2024-12-11 ENCOUNTER — Telehealth: Payer: Self-pay | Admitting: *Deleted

## 2024-12-11 NOTE — Telephone Encounter (Signed)
 Cardiac Catheterization scheduled at [facility] for: Monday December 15, 2024 7:30 AM Arrival time [facility] Main Entrance A at: 5:30 AM  Diet: -Nothing to eat after midnight.  Hydration: -May drink clear liquids until 2 hours before the procedure.  Approved liquids: Water, clear tea, black coffee, fruit juices-non-citric and without pulp,Gatorade, plain Jello/popsicles.   -Please drink 8 oz of water 2 hours before procedure.  Medication instructions: -Hold:  Eliquis -none 12/13/24 until post procedure  Spironolactone /Torsemide /KCl/Jardiance -AM of procedure -Other usual morning medications can be taken including aspirin  81 mg.  Plan to go home the same day, you will only stay overnight if medically necessary.  You must have responsible adult to drive you home.  Someone must be with you the first 24 hours after you arrive home.  Reviewed procedure instructions with patient. Patient did not want to reschedule cath at this time due to possible inclement weather on Monday. Patient has information to call if he does not feel he can safely get to the hospital on Monday and wants to reschedule.

## 2024-12-11 NOTE — Telephone Encounter (Signed)
 Patient applied for medicaid around early October.  Has not heard anything back or received any card.  The generic Entresto  was going to cost him $100 at the [facility] pharmacy.  I asked him to contact Medicaid to follow up on the status of his benefits and also adv I will forward to pharmacy team to see if they have information or can help him receive this at a lower cost.

## 2024-12-12 ENCOUNTER — Other Ambulatory Visit (HOSPITAL_BASED_OUTPATIENT_CLINIC_OR_DEPARTMENT_OTHER): Payer: Self-pay

## 2024-12-12 ENCOUNTER — Encounter (HOSPITAL_BASED_OUTPATIENT_CLINIC_OR_DEPARTMENT_OTHER): Payer: Self-pay

## 2024-12-14 NOTE — Pre-Procedure Instructions (Signed)
 Spoke with patient about canceling his case for Monday 1/26.  He is now rescheduled for Thursday 1/29 @ 1030.  He will arrive 0830.  He will start back taking his Eliquis  today.  He will hold it Tuesday and Wednesday and Thursday morning.  He will also hold his fluid pills morning of procedure.  He verbalized understanding.

## 2024-12-16 ENCOUNTER — Other Ambulatory Visit (HOSPITAL_COMMUNITY): Payer: Self-pay

## 2024-12-16 ENCOUNTER — Telehealth: Payer: Self-pay | Admitting: Licensed Clinical Social Worker

## 2024-12-16 ENCOUNTER — Other Ambulatory Visit (HOSPITAL_BASED_OUTPATIENT_CLINIC_OR_DEPARTMENT_OTHER): Payer: Self-pay

## 2024-12-16 NOTE — Telephone Encounter (Signed)
 H&V Care Navigation CSW Progress Note  Clinical Social Worker contacted pt via MyChart to let him know he has active Medicaid benefits.   Pt has had Medicaid since 08/2024. His Medicaid ID# is [id number] R.  [facility] Medicaid. If he changed addresses since he applied then he may not have received a card. If he needs a new card he can call [facility] Member Services: [phone] All his meds should be $4- will cc rx prior auth team to see if any prior auth needed. .  Patient is participating in a Managed Medicaid Plan:  Yes  SDOH Screenings   Food Insecurity: No Food Insecurity (08/18/2024)  Housing: Unknown (08/20/2024)  Transportation Needs: No Transportation Needs (08/18/2024)  Utilities: Not At Risk (08/18/2024)  Alcohol Screen: Low Risk (08/20/2024)  Depression (PHQ2-9): Low Risk (09/02/2024)  Financial Resource Strain: High Risk (08/20/2024)  Social Connections: Moderately Isolated (09/02/2024)  Tobacco Use: Low Risk (12/08/2024)     Marit Lark, MSW, LCSW Clinical Social Worker II [facility] Heart/Vascular Care Navigation  [phone]- work cell phone (preferred)

## 2024-12-17 NOTE — Telephone Encounter (Addendum)
 Cardiac Cath was rescheduled to 12/18/24-arrive 8:30 AM for 10:30 AM procedure. I do not see any notes about reason it was rescheduled-I assume it was due to inclement weather 12/15/24. I placed call to patient to review instructions for 12/18/24, no answer.

## 2024-12-18 ENCOUNTER — Ambulatory Visit (HOSPITAL_COMMUNITY)
Admission: RE | Admit: 2024-12-18 | Discharge: 2024-12-18 | Disposition: A | Discharge status: Home / Self Care | Attending: Internal Medicine | Admitting: Internal Medicine

## 2024-12-18 ENCOUNTER — Encounter (HOSPITAL_COMMUNITY): Admission: RE | Disposition: A | Payer: Self-pay | Source: Home / Self Care | Attending: Internal Medicine

## 2024-12-18 ENCOUNTER — Other Ambulatory Visit: Payer: Self-pay

## 2024-12-18 DIAGNOSIS — I340 Nonrheumatic mitral (valve) insufficiency: Secondary | ICD-10-CM

## 2024-12-18 DIAGNOSIS — I110 Hypertensive heart disease with heart failure: Secondary | ICD-10-CM | POA: Insufficient documentation

## 2024-12-18 DIAGNOSIS — I5022 Chronic systolic (congestive) heart failure: Secondary | ICD-10-CM | POA: Diagnosis present

## 2024-12-18 DIAGNOSIS — Z7984 Long term (current) use of oral hypoglycemic drugs: Secondary | ICD-10-CM | POA: Insufficient documentation

## 2024-12-18 DIAGNOSIS — I4819 Other persistent atrial fibrillation: Secondary | ICD-10-CM | POA: Diagnosis not present

## 2024-12-18 DIAGNOSIS — I080 Rheumatic disorders of both mitral and aortic valves: Secondary | ICD-10-CM | POA: Diagnosis not present

## 2024-12-18 DIAGNOSIS — Z79899 Other long term (current) drug therapy: Secondary | ICD-10-CM | POA: Diagnosis not present

## 2024-12-18 DIAGNOSIS — E669 Obesity, unspecified: Secondary | ICD-10-CM | POA: Insufficient documentation

## 2024-12-18 DIAGNOSIS — Z7901 Long term (current) use of anticoagulants: Secondary | ICD-10-CM | POA: Insufficient documentation

## 2024-12-18 DIAGNOSIS — Z6841 Body Mass Index (BMI) 40.0 and over, adult: Secondary | ICD-10-CM | POA: Diagnosis not present

## 2024-12-18 LAB — POCT I-STAT EG7
Acid-Base Excess: 5 mmol/L — ABNORMAL HIGH (ref 0.0–2.0)
Bicarbonate: 31.3 mmol/L — ABNORMAL HIGH (ref 20.0–28.0)
Calcium, Ion: 1.2 mmol/L (ref 1.15–1.40)
HCT: 44 % (ref 39.0–52.0)
Hemoglobin: 15 g/dL (ref 13.0–17.0)
O2 Saturation: 68 %
Potassium: 3.3 mmol/L — ABNORMAL LOW (ref 3.5–5.1)
Sodium: 140 mmol/L (ref 135–145)
TCO2: 33 mmol/L — ABNORMAL HIGH (ref 22–32)
pCO2, Ven: 52.8 mmHg (ref 44–60)
pH, Ven: 7.381 (ref 7.25–7.43)
pO2, Ven: 37 mmHg (ref 32–45)

## 2024-12-18 LAB — POCT I-STAT 7, (LYTES, BLD GAS, ICA,H+H)
Acid-Base Excess: 4 mmol/L — ABNORMAL HIGH (ref 0.0–2.0)
Bicarbonate: 28.6 mmol/L — ABNORMAL HIGH (ref 20.0–28.0)
Calcium, Ion: 1.17 mmol/L (ref 1.15–1.40)
HCT: 43 % (ref 39.0–52.0)
Hemoglobin: 14.6 g/dL (ref 13.0–17.0)
O2 Saturation: 98 %
Potassium: 3.3 mmol/L — ABNORMAL LOW (ref 3.5–5.1)
Sodium: 141 mmol/L (ref 135–145)
TCO2: 30 mmol/L (ref 22–32)
pCO2 arterial: 41 mmHg (ref 32–48)
pH, Arterial: 7.452 — ABNORMAL HIGH (ref 7.35–7.45)
pO2, Arterial: 96 mmHg (ref 83–108)

## 2024-12-18 MED ORDER — HEPARIN (PORCINE) IN NACL 2000-0.9 UNIT/L-% IV SOLN
INTRAVENOUS | Status: DC | PRN
  Administered 2024-12-18: 1000 mL

## 2024-12-18 MED ORDER — MIDAZOLAM HCL 2 MG/2ML IJ SOLN
INTRAMUSCULAR | Status: AC
  Filled 2024-12-18: qty 2

## 2024-12-18 MED ORDER — SODIUM CHLORIDE 0.9 % IV SOLN: 250.0000 mL | INTRAVENOUS | Status: DC | PRN

## 2024-12-18 MED ORDER — MIDAZOLAM HCL (PF) 2 MG/2ML IJ SOLN
INTRAMUSCULAR | Status: DC | PRN
  Administered 2024-12-18 (×2): .5 mg via INTRAVENOUS

## 2024-12-18 MED ORDER — TORSEMIDE 20 MG PO TABS: 60.0000 mg | ORAL_TABLET | Freq: Two times a day (BID) | ORAL | Status: AC

## 2024-12-18 MED ORDER — VERAPAMIL HCL 2.5 MG/ML IV SOLN
INTRAVENOUS | Status: DC | PRN
  Administered 2024-12-18: 10 mL via INTRA_ARTERIAL

## 2024-12-18 MED ORDER — ONDANSETRON HCL 4 MG/2ML IJ SOLN: 4.0000 mg | Freq: Four times a day (QID) | INTRAMUSCULAR | Status: DC | PRN

## 2024-12-18 MED ORDER — SODIUM CHLORIDE 0.9% FLUSH: 3.0000 mL | INTRAVENOUS | Status: DC | PRN

## 2024-12-18 MED ORDER — LABETALOL HCL 5 MG/ML IV SOLN: 10.0000 mg | INTRAVENOUS | Status: DC | PRN

## 2024-12-18 MED ORDER — ACETAMINOPHEN 325 MG PO TABS: 650.0000 mg | ORAL_TABLET | ORAL | Status: DC | PRN

## 2024-12-18 MED ORDER — ASPIRIN 81 MG PO CHEW: 81.0000 mg | CHEWABLE_TABLET | ORAL | Status: DC

## 2024-12-18 MED ORDER — IOHEXOL 350 MG/ML SOLN
INTRAVENOUS | Status: DC | PRN
  Administered 2024-12-18: 24 mL

## 2024-12-18 MED ORDER — ASPIRIN 81 MG PO CHEW
81.0000 mg | CHEWABLE_TABLET | ORAL | Status: AC
  Administered 2024-12-18: 81 mg via ORAL
  Filled 2024-12-18: qty 1

## 2024-12-18 MED ORDER — HEPARIN SODIUM (PORCINE) 1000 UNIT/ML IJ SOLN
INTRAMUSCULAR | Status: AC
  Filled 2024-12-18: qty 10

## 2024-12-18 MED ORDER — FREE WATER: 250.0000 mL | Freq: Once | Status: DC

## 2024-12-18 MED ORDER — HEPARIN SODIUM (PORCINE) 1000 UNIT/ML IJ SOLN
INTRAMUSCULAR | Status: DC | PRN
  Administered 2024-12-18: 5000 [IU] via INTRAVENOUS

## 2024-12-18 MED ORDER — HYDRALAZINE HCL 20 MG/ML IJ SOLN: 10.0000 mg | INTRAMUSCULAR | Status: DC | PRN

## 2024-12-18 MED ORDER — LIDOCAINE HCL (PF) 1 % IJ SOLN
INTRAMUSCULAR | Status: DC | PRN
  Administered 2024-12-18 (×3): 5 mL

## 2024-12-18 MED ORDER — FENTANYL CITRATE (PF) 100 MCG/2ML IJ SOLN
INTRAMUSCULAR | Status: DC | PRN
  Administered 2024-12-18 (×2): 12.5 ug via INTRAVENOUS

## 2024-12-18 MED ORDER — VERAPAMIL HCL 2.5 MG/ML IV SOLN
INTRAVENOUS | Status: AC
  Filled 2024-12-18: qty 2

## 2024-12-18 MED ORDER — LIDOCAINE HCL (PF) 1 % IJ SOLN
INTRAMUSCULAR | Status: AC
  Filled 2024-12-18: qty 30

## 2024-12-18 MED ORDER — SODIUM CHLORIDE 0.9% FLUSH: 3.0000 mL | Freq: Two times a day (BID) | INTRAVENOUS | Status: DC

## 2024-12-18 MED ORDER — FENTANYL CITRATE (PF) 100 MCG/2ML IJ SOLN
INTRAMUSCULAR | Status: AC
  Filled 2024-12-18: qty 2

## 2024-12-18 NOTE — Discharge Instructions (Signed)

## 2024-12-18 NOTE — Brief Op Note (Signed)
 BRIEF CARDIAC CATHETERIZATION NOTE  12/18/2024  11:53 AM  PATIENT:  Anthony Byrd  54 y.o. male  PRE-OPERATIVE DIAGNOSIS:  Mitral and aortic valve disease  POST-OPERATIVE DIAGNOSIS:  Same  PROCEDURE:  Procedures: RIGHT HEART CATH AND CORONARY ANGIOGRAPHY (N/A)  SURGEON:  Surgeons and Role:    DEWAINE Mady Bruckner, MD - Primary  FINDINGS: No angiographically significant coronary artery disease. Moderately elevated left heart and pulmonary artery pressures. Severely elevated right heart filling pressure. Mildly-moderately reduced Fick CO/CI.  RECOMMENDATIONS: Escalate diuresis. Ongoing workup/management of HF due to valvular heart disease per Dr. Barbaraann. Primary prevention of CAD. Resume apixaban  this evening if no evidence of bleeding or vascular injury at catheterization sites.  Bruckner Mady, MD [facility]

## 2024-12-18 NOTE — Progress Notes (Signed)
 TR band removed at 1345, gauze dressing applied. Left radial level 0, clean, dry, and intact.

## 2024-12-18 NOTE — Interval H&P Note (Signed)
 History and Physical Interval Note:  12/18/2024 10:44 AM  Lamar FORBES Salt  has presented today for surgery, with the diagnosis of mitral regurgitation.  The various methods of treatment have been discussed with the patient and family. After consideration of risks, benefits and other options for treatment, the patient has consented to  Procedures: RIGHT/LEFT HEART CATH AND CORONARY ANGIOGRAPHY (N/A) as a surgical intervention.  The patient's history has been reviewed, patient examined, no change in status, stable for surgery.  I have reviewed the patient's chart and labs.  Questions were answered to the patient's satisfaction.    Cath Lab Visit (complete for each Cath Lab visit)  Clinical Evaluation Leading to the Procedure:   ACS: No.  Non-ACS:    Anginal/Heart Failure Classification: NYHA class II  Anti-ischemic medical therapy: Minimal Therapy (1 class of medications)  Non-Invasive Test Results: No non-invasive testing performed  Prior CABG: No previous CABG  Anthony Byrd

## 2024-12-19 ENCOUNTER — Encounter (HOSPITAL_COMMUNITY): Payer: Self-pay | Admitting: Internal Medicine

## 2024-12-21 NOTE — Progress Notes (Unsigned)
 " Cardiology Office Note:  .   Date:  12/23/2024  ID:  Anthony Byrd, DOB 10-01-1971, MRN [id number] PCP: Elicia Sharper, DO  [facility] Providers Cardiologist:  Darryle ONEIDA Decent, MD   History of Present Illness: .    Chief Complaint  Patient presents with   Follow-up    Anthony Byrd is a 54 y.o. male with below history who presents for follow-up.   History of Present Illness   Anthony Byrd is a 54 year old male with failed mitral valve repair, moderate aortic stenosis, and persistent atrial fibrillation who presents for follow-up. H  He presents for follow-up after undergoing a left heart catheterization for preoperative assessment. He feels the same regarding his fluid status and notes that his breathing is 'okay'. Torsemide  is more effective than furosemide , which he felt had stopped working. He is actively working on his diet, particularly salt intake, with assistance from his sister.  He has a history of failed mitral valve repair with severe mitral valve regurgitation and moderate aortic stenosis/AI. He experiences shortness of breath with activity, which he attributes to his heart condition. He is scheduled to meet with cardiac surgery in two days.  He is currently taking Eliquis  5 mg twice a day for persistent atrial fibrillation, Jardiance  10 mg daily, metoprolol  succinate 75 mg daily, torsemide  60 mg twice a day, potassium 20 mEq daily, and Entresto . He needs a refill for Jardiance . His potassium levels were previously low.  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 No blockages were found during his recent heart catheterization.           Problem List Severe MR -s/p MV repair/MAZE @ [facility] 12/12/2021 -normal coronaries  - severe MR 08/2024 2. Aortic stenosis/regurgitation  -moderate AS 08/2024 - mod/severe AI 3. Persistent atrial fibrillation -s/p MAZE 12/12/2021 -Atypical AFL TEE/DCCV  12/27/2021 -CHADSVASC=2 (HTN, CHF) 4. HFpEF -EF 45-50% 07/2024 -EF 65-70% 09/04/2024 5. HTN 6. OSA    ROS: All other ROS reviewed and negative. Pertinent positives noted in the HPI.     Studies Reviewed: SABRA       TTE 12/10/2024  1. Left ventricular ejection fraction, by estimation, is 55 to 60%. The  left ventricle has normal function. The left ventricle has no regional  wall motion abnormalities. There is severe concentric left ventricular  hypertrophy. Left ventricular diastolic   function could not be evaluated. Elevated left atrial pressure.   2. Right ventricular systolic function is normal. The right ventricular  size is normal.   3. MR difficult to assess with prior repair, suspect at least moderate.  Elevated gradient suspect largely due to heart rate, MVA 1.9 by PHT. The  mitral valve has been repaired/replaced. Moderate mitral valve  regurgitation. Mild to moderate mitral  stenosis. The mean mitral valve gradient is 13.0 mmHg. Severe mitral  annular calcification. There is a prosthetic annuloplasty ring present in  the mitral position. Procedure Date: 2023.   4. Difficult to quantify AR, moderate by PHT but closer to severe by VCW.  The aortic valve is tricuspid. Aortic valve regurgitation is moderate to  severe. Moderate aortic valve stenosis. Aortic valve area, by VTI measures  1.96 cm. Aortic valve mean  gradient measures 22.0 mmHg. Aortic valve Vmax measures 3.33 m/s.   5. The inferior vena cava is normal in size with greater than 50%  respiratory variability, suggesting right atrial pressure of  3 mmHg.   RHC/LHC 12/18/2024 Conclusions: No angiographically significant coronary artery disease. Moderately elevated left heart and pulmonary artery pressures. Severely elevated right heart filling pressure. Mildly-moderately reduced Fick CO/CI. 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  Physical Exam:    VS:  BP 132/82   Pulse 85   Ht 5' 8 (1.727 m)   Wt 292 lb 3.2 oz (132.5 kg)   SpO2 97%   BMI 44.43 kg/m    Wt Readings from Last 3 Encounters:  12/23/24 292 lb 3.2 oz (132.5 kg)  12/18/24 290 lb (131.5 kg)  12/08/24 289 lb 4.8 oz (131.2 kg)    GEN: Well nourished, well developed in no acute distress NECK: No JVD; No carotid bruits CARDIAC: RRR, 3/6 HSM, rubs, gallops RESPIRATORY:  Clear to auscultation without rales, wheezing or rhonchi  ABDOMEN: Soft, non-tender, non-distended EXTREMITIES:  No edema; No deformity  ASSESSMENT AND PLAN: .   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 Requires mitral valve replacement. Mechanical valve preferred for longevity despite anticoagulation requirement. - Continue torsemide , Aldactone , Entresto , and potassium supplementation. - Discuss valve replacement options with cardiac surgery team. - Recommend mechanical valve replacement with left atrial appendage ligation. - Consider surgical maze procedure.  Moderate aortic stenosis and regurgitation Moderate aortic stenosis and regurgitation. Requires aortic valve replacement. Mechanical valve preferred for longevity despite anticoagulation requirement. - Continue torsemide , Aldactone , Entresto , and potassium supplementation. - Discuss valve replacement options with cardiac surgery team. - Recommend mechanical valve replacement.  Chronic diastolic heart failure (EF 55-60%) Chronic diastolic heart failure with EF 55-60%. Volume status improved with current diuretic therapy. Shortness of breath with activity likely related to valvular heart disease. - Continue torsemide  60 mg BID. - Continue Aldactone  25 mg daily. - Continue Entresto  24-26 mg BID. - Continue potassium 20 mEq daily. - Continue metoprolol  succinate 25 mg daily. - Continue Jardiance  10 mg daily.  Persistent atrial  fibrillation Managed with Eliquis . Discussed potential benefit of surgical maze procedure during valve replacement. - Continue Eliquis  5 mg BID. - Consider surgical maze procedure during valve replacement.                Follow-up: Return in about 6 months (around 06/22/2025).  Signed, Darryle DASEN. Barbaraann, MD, [facility]  [facility]  [address] [place], [place] [address] [phone]  10:51 AM   "

## 2024-12-22 ENCOUNTER — Telehealth: Payer: Self-pay | Admitting: Licensed Clinical Social Worker

## 2024-12-23 ENCOUNTER — Encounter (HOSPITAL_BASED_OUTPATIENT_CLINIC_OR_DEPARTMENT_OTHER): Payer: Self-pay | Admitting: Cardiovascular Disease

## 2024-12-23 ENCOUNTER — Ambulatory Visit (HOSPITAL_BASED_OUTPATIENT_CLINIC_OR_DEPARTMENT_OTHER): Admitting: Cardiovascular Disease

## 2024-12-23 VITALS — BP 132/82 | HR 85 | Ht 68.0 in | Wt 292.2 lb

## 2024-12-23 DIAGNOSIS — I10 Essential (primary) hypertension: Secondary | ICD-10-CM

## 2024-12-23 DIAGNOSIS — I5032 Chronic diastolic (congestive) heart failure: Secondary | ICD-10-CM

## 2024-12-23 DIAGNOSIS — Z5181 Encounter for therapeutic drug level monitoring: Secondary | ICD-10-CM | POA: Diagnosis not present

## 2024-12-23 DIAGNOSIS — I351 Nonrheumatic aortic (valve) insufficiency: Secondary | ICD-10-CM | POA: Diagnosis not present

## 2024-12-23 DIAGNOSIS — I340 Nonrheumatic mitral (valve) insufficiency: Secondary | ICD-10-CM | POA: Diagnosis not present

## 2024-12-23 DIAGNOSIS — I4819 Other persistent atrial fibrillation: Secondary | ICD-10-CM

## 2024-12-23 MED ORDER — EMPAGLIFLOZIN 10 MG PO TABS: 10.0000 mg | ORAL_TABLET | Freq: Every day | ORAL | 3 refills | Status: AC

## 2024-12-23 NOTE — Patient Instructions (Signed)
 Medication Instructions:  Your physician recommends that you continue on your current medications as directed. Please refer to the Current Medication list given to you today.   *If you need a refill on your cardiac medications before your next appointment, please call your pharmacy*  Lab Work: BMET TODAY   If you have labs (blood work) drawn today and your tests are completely normal, you will receive your results only by: MyChart Message (if you have MyChart) OR A paper copy in the mail If you have any lab test that is abnormal or we need to change your treatment, we will call you to review the results.  Testing/Procedures: NONE  Follow-Up: At [facility], you and your health needs are our priority.  As part of our continuing mission to provide you with exceptional heart care, our providers are all part of one team.  This team includes your primary Cardiologist (physician) and Advanced Practice Providers or APPs (Physician Assistants and Nurse Practitioners) who all work together to provide you with the care you need, when you need it.  Your next appointment:   6 month(s)  Provider:   DR O'NEAL OR APP AT [facility]   We recommend signing up for the patient portal called MyChart.  Sign up information is provided on this After Visit Summary.  MyChart is used to connect with patients for Virtual Visits (Telemedicine).  Patients are able to view lab/test results, encounter notes, upcoming appointments, etc.  Non-urgent messages can be sent to your provider as well.   To learn more about what you can do with MyChart, go to [email or web address].   Other Instructions

## 2024-12-25 ENCOUNTER — Ambulatory Visit: Payer: Self-pay | Admitting: Cardiovascular Disease

## 2024-12-25 ENCOUNTER — Ambulatory Visit

## 2024-12-25 DIAGNOSIS — I5032 Chronic diastolic (congestive) heart failure: Secondary | ICD-10-CM

## 2024-12-25 DIAGNOSIS — I10 Essential (primary) hypertension: Secondary | ICD-10-CM

## 2024-12-25 LAB — BASIC METABOLIC PANEL WITH GFR

## 2024-12-29 ENCOUNTER — Ambulatory Visit
